# Patient Record
Sex: Male | Born: 1940 | Race: White | Hispanic: No | Marital: Married | Smoking: Former smoker
Health system: Southern US, Community
[De-identification: ages and names within clinical notes are randomized; demographics above are authoritative.]

## PROBLEM LIST (undated history)

## (undated) DIAGNOSIS — I350 Nonrheumatic aortic (valve) stenosis: Secondary | ICD-10-CM

## (undated) DIAGNOSIS — I471 Supraventricular tachycardia, unspecified: Secondary | ICD-10-CM

## (undated) DIAGNOSIS — R32 Unspecified urinary incontinence: Secondary | ICD-10-CM

## (undated) DIAGNOSIS — I4891 Unspecified atrial fibrillation: Secondary | ICD-10-CM

## (undated) DIAGNOSIS — I4819 Other persistent atrial fibrillation: Secondary | ICD-10-CM

## (undated) DIAGNOSIS — I2510 Atherosclerotic heart disease of native coronary artery without angina pectoris: Secondary | ICD-10-CM

## (undated) DIAGNOSIS — I10 Essential (primary) hypertension: Secondary | ICD-10-CM

## (undated) DIAGNOSIS — K4090 Unilateral inguinal hernia, without obstruction or gangrene, not specified as recurrent: Secondary | ICD-10-CM

## (undated) DIAGNOSIS — N183 Chronic kidney disease, stage 3 (moderate): Secondary | ICD-10-CM

## (undated) DIAGNOSIS — E119 Type 2 diabetes mellitus without complications: Secondary | ICD-10-CM

## (undated) HISTORY — DX: Type 2 diabetes mellitus without complications: E11.9

## (undated) HISTORY — DX: Atherosclerotic heart disease of native coronary artery without angina pectoris: I25.10

## (undated) HISTORY — PX: CARDIAC CATHETERIZATION: SHX172

## (undated) HISTORY — PX: AORTIC VALVE REPLACEMENT: SHX41

## (undated) HISTORY — DX: Unspecified atrial fibrillation: I48.91

## (undated) HISTORY — DX: Nonrheumatic aortic (valve) stenosis: I35.0

## (undated) HISTORY — DX: Unilateral inguinal hernia, without obstruction or gangrene, not specified as recurrent: K40.90

## (undated) HISTORY — DX: Chronic kidney disease, stage 3 (moderate): N18.3

## (undated) HISTORY — DX: Unspecified urinary incontinence: R32

## (undated) HISTORY — DX: Supraventricular tachycardia, unspecified: I47.10

## (undated) HISTORY — DX: Supraventricular tachycardia: I47.1

---
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
## 2005-02-19 ENCOUNTER — Ambulatory Visit: Payer: Self-pay | Admitting: Urology

## 2005-10-27 ENCOUNTER — Ambulatory Visit: Payer: Self-pay

## 2007-05-26 ENCOUNTER — Ambulatory Visit: Payer: Self-pay

## 2008-01-30 HISTORY — PX: COLONOSCOPY: SHX174

## 2010-08-22 ENCOUNTER — Ambulatory Visit: Payer: Self-pay | Admitting: Gastroenterology

## 2010-08-23 LAB — PATHOLOGY REPORT

## 2010-12-27 ENCOUNTER — Ambulatory Visit: Payer: Self-pay | Admitting: Ophthalmology

## 2013-01-29 HISTORY — PX: OTHER SURGICAL HISTORY: SHX169

## 2013-09-21 DIAGNOSIS — E119 Type 2 diabetes mellitus without complications: Secondary | ICD-10-CM | POA: Insufficient documentation

## 2013-12-01 DIAGNOSIS — I119 Hypertensive heart disease without heart failure: Secondary | ICD-10-CM | POA: Insufficient documentation

## 2013-12-01 DIAGNOSIS — N401 Enlarged prostate with lower urinary tract symptoms: Secondary | ICD-10-CM | POA: Insufficient documentation

## 2013-12-01 DIAGNOSIS — E782 Mixed hyperlipidemia: Secondary | ICD-10-CM | POA: Insufficient documentation

## 2013-12-01 DIAGNOSIS — N3943 Post-void dribbling: Secondary | ICD-10-CM | POA: Insufficient documentation

## 2013-12-01 HISTORY — DX: Hypertensive heart disease without heart failure: I11.9

## 2013-12-16 ENCOUNTER — Ambulatory Visit: Payer: Self-pay | Admitting: Ophthalmology

## 2013-12-31 DIAGNOSIS — I350 Nonrheumatic aortic (valve) stenosis: Secondary | ICD-10-CM

## 2013-12-31 HISTORY — DX: Nonrheumatic aortic (valve) stenosis: I35.0

## 2014-01-07 ENCOUNTER — Ambulatory Visit: Payer: Self-pay | Admitting: Internal Medicine

## 2014-01-11 DIAGNOSIS — I2510 Atherosclerotic heart disease of native coronary artery without angina pectoris: Secondary | ICD-10-CM | POA: Insufficient documentation

## 2014-03-02 DIAGNOSIS — D62 Acute posthemorrhagic anemia: Secondary | ICD-10-CM | POA: Insufficient documentation

## 2014-03-04 DIAGNOSIS — I4510 Unspecified right bundle-branch block: Secondary | ICD-10-CM | POA: Insufficient documentation

## 2014-03-04 DIAGNOSIS — I440 Atrioventricular block, first degree: Secondary | ICD-10-CM | POA: Insufficient documentation

## 2014-03-04 HISTORY — DX: Atrioventricular block, first degree: I44.0

## 2014-03-04 HISTORY — DX: Unspecified right bundle-branch block: I45.10

## 2014-03-31 DIAGNOSIS — R002 Palpitations: Secondary | ICD-10-CM | POA: Insufficient documentation

## 2014-04-06 ENCOUNTER — Encounter: Payer: Self-pay | Admitting: Cardiothoracic Surgery

## 2014-05-18 ENCOUNTER — Emergency Department
Admit: 2014-05-18 | Disposition: A | Discharge status: Home / Self Care | Payer: Self-pay | Admitting: Emergency Medicine

## 2014-05-18 LAB — CBC WITH DIFFERENTIAL/PLATELET
BASOS PCT: 0.9 %
Basophil #: 0.1 10*3/uL (ref 0.0–0.1)
Eosinophil #: 0.1 10*3/uL (ref 0.0–0.7)
Eosinophil %: 1.2 %
HCT: 35 % — AB (ref 40.0–52.0)
HGB: 11.4 g/dL — AB (ref 13.0–18.0)
LYMPHS ABS: 1.2 10*3/uL (ref 1.0–3.6)
Lymphocyte %: 17 %
MCH: 26.6 pg (ref 26.0–34.0)
MCHC: 32.4 g/dL (ref 32.0–36.0)
MCV: 82 fL (ref 80–100)
MONOS PCT: 7.4 %
Monocyte #: 0.5 x10 3/mm (ref 0.2–1.0)
Neutrophil #: 5.3 10*3/uL (ref 1.4–6.5)
Neutrophil %: 73.5 %
PLATELETS: 251 10*3/uL (ref 150–440)
RBC: 4.26 10*6/uL — AB (ref 4.40–5.90)
RDW: 15.4 % — ABNORMAL HIGH (ref 11.5–14.5)
WBC: 7.2 10*3/uL (ref 3.8–10.6)

## 2014-05-18 LAB — COMPREHENSIVE METABOLIC PANEL
ALBUMIN: 3.8 g/dL
AST: 30 U/L
Alkaline Phosphatase: 75 U/L
Anion Gap: 7 (ref 7–16)
BUN: 34 mg/dL — ABNORMAL HIGH
Bilirubin,Total: 0.5 mg/dL
Calcium, Total: 8.7 mg/dL — ABNORMAL LOW
Chloride: 107 mmol/L
Co2: 25 mmol/L
Creatinine: 1.71 mg/dL — ABNORMAL HIGH
GFR CALC AF AMER: 44 — AB
GFR CALC NON AF AMER: 38 — AB
GLUCOSE: 277 mg/dL — AB
Potassium: 3.4 mmol/L — ABNORMAL LOW
SGPT (ALT): 17 U/L
Sodium: 139 mmol/L
TOTAL PROTEIN: 6.6 g/dL

## 2014-05-18 LAB — TROPONIN I: Troponin-I: 0.04 ng/mL — ABNORMAL HIGH

## 2014-05-18 LAB — TSH: Thyroid Stimulating Horm: 2.495 u[IU]/mL

## 2014-05-28 LAB — COMPREHENSIVE METABOLIC PANEL
ANION GAP: 7 (ref 7–16)
Albumin: 4 g/dL
Alkaline Phosphatase: 72 U/L
BUN: 25 mg/dL — AB
Bilirubin,Total: 0.5 mg/dL
CO2: 26 mmol/L
Calcium, Total: 9 mg/dL
Chloride: 107 mmol/L
Creatinine: 1.32 mg/dL — ABNORMAL HIGH
EGFR (African American): >60
EGFR (Non-African Amer.): 52 — ABNORMAL LOW
Glucose: 160 mg/dL — ABNORMAL HIGH
Potassium: 3.8 mmol/L
SGOT(AST): 26 U/L
SGPT (ALT): 16 U/L — ABNORMAL LOW
SODIUM: 140 mmol/L
TOTAL PROTEIN: 6.8 g/dL

## 2014-05-28 LAB — CBC
HCT: 36.5 % — AB (ref 40.0–52.0)
HGB: 12 g/dL — AB (ref 13.0–18.0)
MCH: 27 pg (ref 26.0–34.0)
MCHC: 32.8 g/dL (ref 32.0–36.0)
MCV: 82 fL (ref 80–100)
Platelet: 205 10*3/uL (ref 150–440)
RBC: 4.44 10*6/uL (ref 4.40–5.90)
RDW: 16.2 % — AB (ref 11.5–14.5)
WBC: 9.5 10*3/uL (ref 3.8–10.6)

## 2014-05-28 LAB — TROPONIN I
TROPONIN-I: 0.12 ng/mL — AB
Troponin-I: 0.17 ng/mL — ABNORMAL HIGH

## 2014-05-29 ENCOUNTER — Inpatient Hospital Stay
Admit: 2014-05-29 | Discharge: 2014-05-29 | Disposition: A | Discharge status: Home / Self Care | Payer: Medicare Other | Attending: Specialist | Admitting: Specialist

## 2014-05-29 DIAGNOSIS — I471 Supraventricular tachycardia, unspecified: Secondary | ICD-10-CM | POA: Diagnosis present

## 2014-05-29 LAB — TROPONIN I: Troponin-I: 0.15 ng/mL — ABNORMAL HIGH

## 2014-05-29 MED ORDER — SODIUM CHLORIDE 0.9 % IJ SOLN: 3.0000 mL | INTRAMUSCULAR | Status: DC | PRN

## 2014-05-29 MED ORDER — ACETAMINOPHEN 325 MG PO TABS: 650.0000 mg | ORAL_TABLET | ORAL | Status: DC | PRN

## 2014-05-29 MED ORDER — ASPIRIN EC 81 MG PO TBEC: 81.0000 mg | DELAYED_RELEASE_TABLET | Freq: Every day | ORAL | Status: DC

## 2014-05-29 MED ORDER — ONDANSETRON HCL 4 MG/2ML IJ SOLN: 4.0000 mg | INTRAMUSCULAR | Status: DC | PRN

## 2014-05-29 MED ORDER — METOPROLOL SUCCINATE ER 100 MG PO TB24: 100.0000 mg | ORAL_TABLET | Freq: Every day | ORAL | Status: DC

## 2014-05-29 MED ORDER — DILTIAZEM HCL ER COATED BEADS 240 MG PO CP24: 240.0000 mg | ORAL_CAPSULE | Freq: Every day | ORAL | Status: DC

## 2014-05-29 MED ORDER — LISINOPRIL 20 MG PO TABS: 20.0000 mg | ORAL_TABLET | Freq: Two times a day (BID) | ORAL | Status: DC

## 2014-05-31 DIAGNOSIS — Z8679 Personal history of other diseases of the circulatory system: Secondary | ICD-10-CM | POA: Insufficient documentation

## 2014-05-31 NOTE — Consult Note (Signed)
PATIENT NAME:  Timothy Riley, CLOWSER MR#:  [id number] DATE OF BIRTH:  1940-12-15  DATE OF CONSULTATION:  05/29/2014  REFERRING PHYSICIAN:   CONSULTING PHYSICIAN:  Isaias Cowman, MD  PRIMARY CARE PHYSICIAN: Youlanda Roys. Lovie Macadamia, MD  CARDIOLOGIST: Corey Skains, MD   CHIEF COMPLAINT: Palpitations.    REASON FOR CONSULTATION: Consultation requested for evaluation of paroxysmal SVT.   HISTORY OF PRESENT ILLNESS: The patient is a 74 year old gentleman status post recent aortic valve replacement, admitted after an episode of paroxysmal SVT. The patient underwent recent aortic valve replacement with bioprosthetic valve by Dr. Evelina Dun at [facility] in 03/2014. The patient has been in his usual state of health until the past 2 weeks when he has had 4 separate episodes of palpitations and apparent SVT. Approximately 10 days ago, the patient was watching TV, developed racing heart rate, went to the Emergency Room, was diagnosed with PSVT, treated with intravenous metoprolol and converted to sinus rhythm. The patient has had 2 additional episodes during the interim and with each episode, Dr. Nehemiah Massed has increased his metoprolol succinate to 100 and 200 mg daily. The patient reports that he is currently taking 100 mg daily and did not increase it to 200 mg daily as prescribed. 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 He started to develop palpitations, was talking to a customer and was unable to speak for approximately 30 seconds. EMS was called, the patient was tachycardic with rates in the 160s, was given 2 doses of adenosine without conversion. The patient was then given a Cardizem bolus and converted to sinus rhythm. He presented to [facility] Emergency Room where he was in sinus rhythm with frequent premature atrial contractions. He has remained in sinus rhythm overnight. The patient has borderline elevated troponin of 0.17 in the absence of chest pain. The patient has  undergone recent aortic valve replacement without significant coronary artery disease.    PAST MEDICAL HISTORY: 1.  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  2.  Paroxysmal SVT.  3.  Hypertension.  4.  Diabetes medication.   MEDICATIONS ON ADMISSION: Aspirin 81 mg daily, amlodipine 5 mg daily, metoprolol succinate 100 mg daily, lisinopril 20 mg daily, glipizide 5 mg b.i.d.   SOCIAL HISTORY: The patient is married, resides with his wife. He denies tobacco abuse.   FAMILY HISTORY: No immediate family history of coronary artery disease or myocardial infarction.   REVIEW OF SYSTEMS:  CONSTITUTIONAL: No fever or chills.  EYES: No blurry vision.  EARS: No hearing loss.  RESPIRATORY: The patient denies shortness of breath.  CARDIOVASCULAR: Palpitations as described above.  GASTROINTESTINAL: No nausea, vomiting, or diarrhea.  GENITOURINARY: No dysuria or hematuria.  ENDOCRINE: No polyuria or polydipsia.  MUSCULOSKELETAL: No arthralgias or myalgias.  NEUROLOGICAL: No focal muscle weakness or numbness.  PSYCHOLOGICAL: No depression or anxiety.   PHYSICAL EXAMINATION: VITAL SIGNS: Blood pressure of 190/73, pulse 55, respirations 20, temperature 97.8, pulse oximetry 96%.  HEENT: Pupils equal and reactive to light and accommodation.  NECK: Supple without thyromegaly.  LUNGS: Clear.  CARDIOVASCULAR: Normal JVP. Normal PMI. Regular rate and rhythm. Normal S1, S2. No appreciable gallop, murmur, or rub.  ABDOMEN: Soft and nontender. Pulses were intact bilaterally.  MUSCULOSKELETAL: Normal muscle tone.  NEUROLOGIC: The patient is alert and oriented x 3. Motor and sensory both grossly intact.   IMPRESSION: A 74 year old gentleman with recent aortic valve replacement surgery 03/2014, 4 separate episodes of tachycardia, probable  paroxysmal supraventricular tachycardia, initial episode responded to metoprolol, most recent episode responded to Cardizem. The patient currently in sinus  rhythm with frequent premature atrial contractions. Blood pressure is elevated.   RECOMMENDATIONS: 1.  Continue metoprolol succinate 100 mg daily.  2.  Switch Cardizem 60 mg q. 8 to Cardizem CD 240 mg daily.  3.  Continue aspirin for now.    4.  Repeat 2-D echocardiogram to rule out intracardiac thrombus in light of brief episode of aphasia.  5.  Further recommendations pending echocardiogram results.  6.  Will likely refer to [facility] for outpatient evaluation of catheter ablation of SVT.    ____________________________ Isaias Cowman, MD ap:at D: 05/29/2014 08:27:47 ET T: 05/29/2014 12:48:51 ET JOB#: [id number]  cc: Isaias Cowman, MD, <Dictator> Isaias Cowman MD ELECTRONICALLY SIGNED 05/29/2014 14:17

## 2014-05-31 NOTE — H&P (Signed)
PATIENT NAME:  Timothy Riley, Timothy Riley MR#:  [id number] DATE OF BIRTH:  [birth year]  DATE OF ADMISSION:  05/28/2014  PRIMARY CARE PHYSICIAN:  Dr. Lovie Macadamia.   CARDIOLOGIST: Dr. Serafina Royals.   CHIEF COMPLAINT: Palpitations.   HISTORY OF PRESENT ILLNESS: This is a 74 year old male who presented to the Emergency Room due to palpitations and noted to be in SVT.  The patient says that he had 4 attacks of palpitations now in the past 2 weeks. He had a similar one about 10 days ago where he was sitting watching TV when he started to feel his heart was racing. He also started to develop some chest discomfort with it. He therefore came to the ER. He was given some metoprolol and he converted to a sinus rhythm and was told to follow up with his cardiologist, Dr. Nehemiah Massed. He had another attack shortly after he was seen in the ER and discharged home. He went to see Dr. Nehemiah Massed who then increased his metoprolol dose and doubled it. He then had another additional episode a few days back and was advised to also increase his metoprolol again. Today while at work he had an episode where his palpitations were not improving and then he had an episode where he lost his speech for about 15-30 seconds. EMS was called, when EMS arrived his heart rate was in the 160s. The patient was given 6 of adenosine and converted to a sinus rhythm, but shortly thereafter went back into SVT. He was then given 12 mg of adenosine and then converted to sinus rhythm and went back into SVT again. He was then given another 12 mg of additional adenosine and 10 mg of Cardizem and then converted to a sinus rhythm and has been maintained in it. The patient presented to the ER and was noted to be in sinus rhythm with frequent PACs.  His troponin was mildly elevated at 0.12. Hospitalist services were contacted for further treatment and evaluation.   REVIEW OF SYSTEMS:    CONSTITUTIONAL: No documented fever. No weight gain, no weight loss.  EYES: No blurry  or double vision.  EARS, NOSE, AND THROAT: No tinnitus. No postnasal drip. No redness of the oropharynx.  RESPIRATORY: No cough, no wheeze, no hemoptysis, no dyspnea.  CARDIOVASCULAR: No chest pain, no orthopnea. Positive palpitations.  No syncope.  GASTROINTESTINAL: No nausea, no vomiting, no diarrhea. No abdominal pain. No melena or hematochezia.  GENITOURINARY: No dysuria or hematuria.  ENDOCRINE: No polyuria, nocturia, heat or cold intolerance.  HEMATOLOGIC: No anemia. No bruising. No bleeding.  INTEGUMENTARY: No rashes. No lesions.  MUSCULOSKELETAL: No arthritis. No swelling. No gout.  NEUROLOGIC: No numbness or tingling. No ataxia. No seizure activity.  PSYCHIATRIC: No anxiety, no insomnia, no ADD.    PAST MEDICAL HISTORY: Consistent with diabetes, hypertension, history of recent aortic valve replacement for aortic stenosis.   ALLERGIES: HYDRALAZINE WHICH CAUSES A RASH.   SOCIAL HISTORY: No smoking. No alcohol abuse. No illicit drug abuse. Lives at home with his family.   FAMILY HISTORY: Mother and father are both deceased. The patient's mother and father both had hypertension.   CURRENT MEDICATIONS:  Amlodipine 5 mg daily, aspirin 81 mg daily, glipizide 5 mg b.i.d., lisinopril 20 mg daily, metoprolol succinate 100 mg daily.   PHYSICAL EXAMINATION:  VITAL SIGNS: Temperature is 98.4, pulse 69, respirations 20, blood pressure 112/65, saturation is 95% on room air.  GENERAL: He is a pleasant-appearing male in no apparent distress.  HEAD, EYES, EARS, NOSE,  THROAT: He is atraumatic, normocephalic. Extraocular muscles are intact. Pupils reactive to light. Sclerae anicteric. No conjunctival injection. No pharyngeal erythema.  NECK: Supple. There is no jugular venous distention. No bruits, no lymphadenopathy, no thyromegaly.  HEART: Regular rate and rhythm. No murmurs, no rubs, no clicks.  LUNGS: Clear to auscultation bilaterally. No rales or rhonchi. No wheezes.  ABDOMEN: Soft, flat,  nontender, nondistended. Has good bowel sounds. No hepatosplenomegaly appreciated.  EXTREMITIES: No evidence of any cyanosis, clubbing, or peripheral edema. Has + 2 pedal and radial pulses bilaterally.  NEUROLOGICAL: The patient is alert, awake, and oriented x 3 with no focal motor or sensory deficits appreciated bilaterally.  SKIN: Moist and warm with no rashes appreciated.  LYMPHATIC: There is no cervical or axillary lymphadenopathy.   LABORATORY DATA: Serum glucose of 160, BUN 25, creatinine 1.3, sodium 140, potassium 3.8, chloride 107, bicarbonate 26. LFTs are within normal limits. Troponin 0.12. White cell count 9.5, hemoglobin 12.0, hematocrit 36.5, platelet count 205,000. The patient had a chest x-ray done which showed no evidence of any acute cardiopulmonary disease, status post aortic valve replacement.   ASSESSMENT AND PLAN: This is a 74 year old male with a history of diabetes, hypertension, history of recent aortic valve replacement for aortic stenosis, who presented to the hospital due to palpitations and noted to be in supraventricular tachycardia.   1.  Supraventricular tachycardia. This was likely the cause of the patient's palpitations and chest pain. The exact underlying rhythm is unclear as he is converted to a sinus rhythm, whether it is atrial fibrillation or atrial flutter or sinus arrhythmia. The patient has apparently had 4 attacks of palpitations in the past 2-3 weeks and has not improved despite increasing doses of metoprolol. For now observe him on telemetry. Follow serial cardiac markers. We will get a cardiology consult. We will place him on oral Cardizem. He likely needs an electrophysiologic study to be done as an outpatient, but will defer this to cardiology for now.  2.  Diabetes. Continue his glipizide and carbohydrate-controlled diet.  3.  Hypertension. Continue Norvasc and lisinopril.   4.  Elevated troponin. This is  likely in the setting of demand ischemia from his  supraventricular tachycardia. There is no acute evidence of coronary syndrome. Observe him on telemetry, follow serial cardiac markers, continue aspirin for now.   CODE STATUS: The patient is a full code.   TIME SPENT ON ADMISSION: 50 minutes.     ____________________________ Belia Heman. Verdell Carmine, MD vjs:bu D: 05/28/2014 19:55:48 ET T: 05/28/2014 20:27:52 ET JOB#: [id number]  cc: Belia Heman. Verdell Carmine, MD, <Dictator> Henreitta Leber MD ELECTRONICALLY SIGNED 05/28/2014 22:21

## 2014-05-31 NOTE — Discharge Summary (Signed)
PATIENT NAME:  Timothy Riley, Timothy Riley MR#:  [id number] DATE OF BIRTH:  [birth year]  DATE OF ADMISSION:  05/28/2014 DATE OF DISCHARGE:  05/29/2014  PRIMARY CARE PHYSICIAN: Woodfin Ganja, MD   CARDIOLOGIST: Corey Skains, MD   FINAL DIAGNOSES: 1.  Supraventricular tachycardia with borderline troponin.  2.  Brief aphasia.  3.  Hypertension, essential.  4.  Diabetes, controlled.   MEDICATIONS ON DISCHARGE: Include glipizide 5 mg twice a day, lisinopril 20 mg at bedtime, metoprolol ER 100 mg in the morning, aspirin 81 mg at bedtime, diltiazem 240 mg extended-release at bedtime; stop taking amlodipine.   DIET: Low sodium diet, regular consistency.   ACTIVITY: As tolerated.   FOLLOWUP: In 1 to 2 weeks with Dr. Lovie Macadamia; follow-up family said patient had an appointment on Monday with Dr. Nehemiah Massed.   HOSPITAL COURSE: The patient was admitted as an observation April 29, discharged on April 30; came in with palpitations. The patient also had a transient episode where he last lost his speech for a period of time, but did not have any weakness at that time. When EMS arrived, he had a heart rate in the 160s, given 6 mg adenosine, converted to sinus rhythm, but shortly after went into supraventricular tachycardia, given 12 mg of adenosine, converted to sinus rhythm, and went into supraventricular tachycardia again, given another 12 mg of adenosine, and 10 mg of Cardizem, and converted to sinus rhythm. The patient was admitted as an observation.   LABORATORY AND RADIOLOGICAL DATA: During the hospital course, included:  1.  A troponin of 0.12, white blood cell count 9.5, hemoglobin and hematocrit 12.0 and 36.5; platelet count of 205,000, glucose 160, BUN 25, creatinine 1.32, sodium 140, potassium 3.8, chloride 107, CO2 of 26, calcium 9.0; liver function tests normal range, chest x-ray stable cardiomegaly, status post aortic valve replacement, no edema or consolidation.  2.  Troponin borderline at 0.17, next  troponin borderline at 0.15; echocardiogram showed an ejection fraction of 50% to 55%, dilated left and right atrium, bovine prosthesis in the aortic position.   HOSPITAL COURSE PER PROBLEM LIST:  1.  For the patient's SVT and borderline troponin. Heart rate controlled, seen in consultation by Dr. Saralyn Pilar, who was covering for Dr. Nehemiah Massed, and kept on his metoprolol and Cardizem CD was prescribed upon going home. Short-acting Cardizem was used during the hospital course. The patient maintain sinus rhythm, actually had an episode of bradycardia with heart rate down into the 30s, but asymptomatic with that. The patient was more symptomatic with the tachycardia. Can follow up with EP specialist for possible ablation, but follow-up with Dr. Nehemiah Massed first.  2.  Brief episode of aphasia. ER physician and admitting physician did not order any imaging. Since the patient is asymptomatic at this point, I believe this is likely secondary to the fast heart rate, no further imaging needed at this point.  3.  Hypertension, essential. Blood pressure did go up, but better controlled upon discharge.  4.  Diabetes type 2 controlled.   Time spent on discharge 35 minutes.    ____________________________ Tana Conch. Leslye Peer, MD rjw:[id number] D: 05/29/2014 15:24:39 ET T: 05/30/2014 09:03:02 ET JOB#: [id number]  cc: Tana Conch. Leslye Peer, MD, <Dictator> Youlanda Roys. Lovie Macadamia, MD Corey Skains, MD  Marisue Brooklyn MD ELECTRONICALLY SIGNED 05/31/2014 7:23

## 2014-06-17 DIAGNOSIS — N183 Chronic kidney disease, stage 3 unspecified: Secondary | ICD-10-CM

## 2014-06-17 DIAGNOSIS — I4891 Unspecified atrial fibrillation: Secondary | ICD-10-CM | POA: Insufficient documentation

## 2014-06-17 DIAGNOSIS — N1832 Chronic kidney disease, stage 3b: Secondary | ICD-10-CM | POA: Insufficient documentation

## 2014-06-17 HISTORY — DX: Chronic kidney disease, stage 3 unspecified: N18.30

## 2014-06-29 ENCOUNTER — Other Ambulatory Visit: Payer: Self-pay

## 2014-06-29 ENCOUNTER — Emergency Department
Admission: EM | Admit: 2014-06-29 | Discharge: 2014-06-29 | Disposition: A | Discharge status: Home / Self Care | Payer: Medicare Other | Attending: Emergency Medicine | Admitting: Emergency Medicine

## 2014-06-29 ENCOUNTER — Encounter: Payer: Self-pay | Admitting: Emergency Medicine

## 2014-06-29 DIAGNOSIS — I10 Essential (primary) hypertension: Secondary | ICD-10-CM | POA: Diagnosis present

## 2014-06-29 DIAGNOSIS — Z7982 Long term (current) use of aspirin: Secondary | ICD-10-CM | POA: Diagnosis not present

## 2014-06-29 DIAGNOSIS — Z79899 Other long term (current) drug therapy: Secondary | ICD-10-CM | POA: Diagnosis not present

## 2014-06-29 HISTORY — DX: Essential (primary) hypertension: I10

## 2014-06-29 LAB — BASIC METABOLIC PANEL
ANION GAP: 10 (ref 5–15)
BUN: 21 mg/dL — ABNORMAL HIGH (ref 6–20)
CO2: 29 mmol/L (ref 22–32)
Calcium: 9.1 mg/dL (ref 8.9–10.3)
Chloride: 103 mmol/L (ref 101–111)
Creatinine, Ser: 1.3 mg/dL — ABNORMAL HIGH (ref 0.61–1.24)
GFR calc Af Amer: >60 mL/min (ref >60)
GFR calc non Af Amer: 52 mL/min — ABNORMAL LOW (ref >60)
Glucose, Bld: 206 mg/dL — ABNORMAL HIGH (ref 65–99)
Potassium: 3.6 mmol/L (ref 3.5–5.1)
SODIUM: 142 mmol/L (ref 135–145)

## 2014-06-29 LAB — CBC
HEMATOCRIT: 36.7 % — AB (ref 40.0–52.0)
HEMOGLOBIN: 12.4 g/dL — AB (ref 13.0–18.0)
MCH: 27.4 pg (ref 26.0–34.0)
MCHC: 33.6 g/dL (ref 32.0–36.0)
MCV: 81.3 fL (ref 80.0–100.0)
Platelets: 179 10*3/uL (ref 150–440)
RBC: 4.52 MIL/uL (ref 4.40–5.90)
RDW: 16.7 % — ABNORMAL HIGH (ref 11.5–14.5)
WBC: 6.2 10*3/uL (ref 3.8–10.6)

## 2014-06-29 LAB — TROPONIN I

## 2014-06-29 NOTE — ED Notes (Signed)
Pt to ed with c/o HTN,  Pt states he is scheduled for ablation tomorrow and was in to see cardiac md today,  Was told to come here after finding blood pressure elevated.  Pt states he was told to stop all bp meds for 5 days prior to having ablation.  Denies chest pain.

## 2014-06-29 NOTE — ED Provider Notes (Signed)
[facility] Emergency Department Provider Note   ____________________________________________  Time seen: 1950  I have reviewed the triage vital signs and the nursing notes.   HISTORY  Chief Complaint Hypertension   History limited by: Not Limited   HPI Timothy Riley is a 74 y.o. male who presents to the emergency department today at the request of his anesthesiologist for high blood pressure. The patient is scheduled to have an ablation tomorrow. Today he had a preop appointment he was noted to have high blood pressure. Patient does state that he was told to stay off of his blood pressure medications 5 days prior to the surgery. The patient himself denies any headache, chest pain or any other symptoms. States he has a long history of hard to control blood pressure.   Past Medical History  Diagnosis Date  . Hypertension     Patient Active Problem List   Diagnosis Date Noted  . 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 Heart valve replacment      Current Outpatient Rx  Name  Route  Sig  Dispense  Refill  . amLODipine (NORVASC) 5 MG tablet   Oral   Take 5 mg by mouth daily. In the morning         . aspirin 81 MG tablet   Oral   Take 81 mg by mouth daily. At bed time.         Marland Kitchen glipiZIDE (GLUCOTROL) 5 MG tablet   Oral   Take 5 mg by mouth 2 (two) times daily.         Marland Kitchen lisinopril (PRINIVIL,ZESTRIL) 20 MG tablet   Oral   Take 20 mg by mouth daily. Orally once a day at (bedtime)         . metoprolol succinate (TOPROL-XL) 100 MG 24 hr tablet   Oral   Take 100 mg by mouth daily. Take with or immediately following a meal. Once a day in the morning.           Allergies Hydralazine hcl  History reviewed. No pertinent family history.  Social History History  Substance Use Topics  . Smoking status: Never Smoker   . Smokeless tobacco: Not on file  . Alcohol Use: No    Review of  Systems  Constitutional: Negative for fever. Cardiovascular: Negative for chest pain. Respiratory: Negative for shortness of breath. Gastrointestinal: Negative for abdominal pain, vomiting and diarrhea. Genitourinary: Negative for dysuria. Musculoskeletal: Negative for back pain. Skin: Negative for rash. Neurological: Negative for headaches, focal weakness or numbness.   10-point ROS otherwise negative.  ____________________________________________   PHYSICAL EXAM:  VITAL SIGNS: ED Triage Vitals  Enc Vitals Group     BP 06/29/14 1726 204/82 mmHg     Pulse Rate 06/29/14 1726 98     Resp 06/29/14 1726 16     Temp 06/29/14 1726 98.6 F (37 C)     Temp Source 06/29/14 1726 Oral     SpO2 06/29/14 1726 97 %     Weight 06/29/14 1726 169 lb (76.658 kg)     Height 06/29/14 1726 5\' 7"  (1.702 m)     Head Cir --      Peak Flow --      Pain Score 06/29/14 1747 0     Pain Loc --      Pain Edu? --      Excl. in [place]? --      Constitutional:  Alert and oriented. Well appearing and in no distress. Eyes: Conjunctivae are normal. PERRL. Normal extraocular movements. ENT   Head: Normocephalic and atraumatic.   Nose: No congestion/rhinnorhea.   Mouth/Throat: Mucous membranes are moist.   Neck: No stridor. Hematological/Lymphatic/Immunilogical: No cervical lymphadenopathy. Cardiovascular: Normal rate, regular rhythm.  No murmurs, rubs, or gallops. Respiratory: Normal respiratory effort without tachypnea nor retractions. Breath sounds are clear and equal bilaterally. No wheezes/rales/rhonchi. Gastrointestinal: Soft and nontender. No distention.  Genitourinary: Deferred Musculoskeletal: Normal range of motion in all extremities. No joint effusions.  No lower extremity tenderness nor edema. Neurologic:  Normal speech and language. No gross focal neurologic deficits are appreciated. Speech is normal.  Skin:  Skin is warm, dry and intact. No rash noted. Psychiatric: Mood and  affect are normal. Speech and behavior are normal. Patient exhibits appropriate insight and judgment.  ____________________________________________    LABS (pertinent positives/negatives)  Labs Reviewed  CBC - Abnormal; Notable for the following:    Hemoglobin 12.4 (*)    HCT 36.7 (*)    RDW 16.7 (*)    All other components within normal limits  BASIC METABOLIC PANEL - Abnormal; Notable for the following:    Glucose, Bld 206 (*)    BUN 21 (*)    Creatinine, Ser 1.30 (*)    GFR calc non Af Amer 52 (*)    All other components within normal limits  TROPONIN I     ____________________________________________   EKG  I, Nance Pear, attending physician, personally viewed and interpreted this EKG  EKG Time: 1747 Rate: 69 Rhythm: Normal Sinus rhythm Axis: Normal Intervals: QTC 490 QRS: Right bundle-branch block ST changes: No ST elevation    ____________________________________________    RADIOLOGY  None  ____________________________________________   PROCEDURES  Procedure(s) performed: None  Critical Care performed: No  ____________________________________________   INITIAL IMPRESSION / ASSESSMENT AND PLAN / ED COURSE  Pertinent labs & imaging results that were available during my care of the patient were reviewed by me and considered in my medical decision making (see chart for details).  Patient sent here for high blood pressure. Patient is asymptomatic. Patient has a long history of high blood pressure. Blood work here without any concerning findings. Will discharge home.  ____________________________________________   FINAL CLINICAL IMPRESSION(S) / ED DIAGNOSES  Final diagnoses:  Essential hypertension     Nance Pear, MD 06/29/14 2020

## 2014-06-29 NOTE — Discharge Instructions (Signed)
Please seek medical attention for any high fevers, chest pain, shortness of breath, change in behavior, persistent vomiting, bloody stool or any other new or concerning symptoms.  Hypertension Hypertension, commonly called high blood pressure, is when the force of blood pumping through your arteries is too strong. Your arteries are the blood vessels that carry blood from your heart throughout your body. A blood pressure reading consists of a higher number over a lower number, such as 110/72. The higher number (systolic) is the pressure inside your arteries when your heart pumps. The lower number (diastolic) is the pressure inside your arteries when your heart relaxes. Ideally you want your blood pressure below 120/80. Hypertension forces your heart to work harder to pump blood. Your arteries may become narrow or stiff. Having hypertension puts you at risk for heart disease, stroke, and other problems.  RISK FACTORS Some risk factors for high blood pressure are controllable. Others are not.  Risk factors you cannot control include:   Race. You may be at higher risk if you are African American.  Age. Risk increases with age.  Gender. Men are at higher risk than women before age 80 years. After age 44, women are at higher risk than men. Risk factors you can control include:  Not getting enough exercise or physical activity.  Being overweight.  Getting too much fat, sugar, calories, or salt in your diet.  Drinking too much alcohol. SIGNS AND SYMPTOMS Hypertension does not usually cause signs or symptoms. Extremely high blood pressure (hypertensive crisis) may cause headache, anxiety, shortness of breath, and nosebleed. DIAGNOSIS  To check if you have hypertension, your health care provider will measure your blood pressure while you are seated, with your arm held at the level of your heart. It should be measured at least twice using the same arm. Certain conditions can cause a difference in  blood pressure between your right and left arms. A blood pressure reading that is higher than normal on one occasion does not mean that you need treatment. If one blood pressure reading is high, ask your health care provider about having it checked again. TREATMENT  Treating high blood pressure includes making lifestyle changes and possibly taking medicine. Living a healthy lifestyle can help lower high blood pressure. You may need to change some of your habits. Lifestyle changes may include:  Following the DASH diet. This diet is high in fruits, vegetables, and whole grains. It is low in salt, red meat, and added sugars.  Getting at least 2 hours of brisk physical activity every week.  Losing weight if necessary.  Not smoking.  Limiting alcoholic beverages.  Learning ways to reduce stress. If lifestyle changes are not enough to get your blood pressure under control, your health care provider may prescribe medicine. You may need to take more than one. Work closely with your health care provider to understand the risks and benefits. HOME CARE INSTRUCTIONS  Have your blood pressure rechecked as directed by your health care provider.   Take medicines only as directed by your health care provider. Follow the directions carefully. Blood pressure medicines must be taken as prescribed. The medicine does not work as well when you skip doses. Skipping doses also puts you at risk for problems.   Do not smoke.   Monitor your blood pressure at home as directed by your health care provider. SEEK MEDICAL CARE IF:   You think you are having a reaction to medicines taken.  You have recurrent headaches or feel  dizzy.  You have swelling in your ankles.  You have trouble with your vision. SEEK IMMEDIATE MEDICAL CARE IF:  You develop a severe headache or confusion.  You have unusual weakness, numbness, or feel faint.  You have severe chest or abdominal pain.  You vomit repeatedly.  You  have trouble breathing. MAKE SURE YOU:   Understand these instructions.  Will watch your condition.  Will get help right away if you are not doing well or get worse. Document Released: 01/15/2005 Document Revised: 06/01/2013 Document Reviewed: 11/07/2012 [facility] Patient Information 2015 [place], [place]. This information is not intended to replace advice given to you by your health care provider. Make sure you discuss any questions you have with your health care provider.

## 2014-07-29 ENCOUNTER — Encounter: Payer: Self-pay | Admitting: *Deleted

## 2014-07-29 ENCOUNTER — Emergency Department
Admission: EM | Admit: 2014-07-29 | Discharge: 2014-07-29 | Disposition: A | Discharge status: Home / Self Care | Payer: Medicare Other | Attending: Emergency Medicine | Admitting: Emergency Medicine

## 2014-07-29 DIAGNOSIS — I10 Essential (primary) hypertension: Secondary | ICD-10-CM | POA: Insufficient documentation

## 2014-07-29 DIAGNOSIS — Z79899 Other long term (current) drug therapy: Secondary | ICD-10-CM | POA: Diagnosis not present

## 2014-07-29 DIAGNOSIS — S60562A Insect bite (nonvenomous) of left hand, initial encounter: Secondary | ICD-10-CM | POA: Diagnosis present

## 2014-07-29 DIAGNOSIS — Y9289 Other specified places as the place of occurrence of the external cause: Secondary | ICD-10-CM | POA: Insufficient documentation

## 2014-07-29 DIAGNOSIS — Y998 Other external cause status: Secondary | ICD-10-CM | POA: Diagnosis not present

## 2014-07-29 DIAGNOSIS — Z7982 Long term (current) use of aspirin: Secondary | ICD-10-CM | POA: Insufficient documentation

## 2014-07-29 DIAGNOSIS — W57XXXA Bitten or stung by nonvenomous insect and other nonvenomous arthropods, initial encounter: Secondary | ICD-10-CM | POA: Diagnosis not present

## 2014-07-29 DIAGNOSIS — Y9389 Activity, other specified: Secondary | ICD-10-CM | POA: Insufficient documentation

## 2014-07-29 MED ORDER — KETOROLAC TROMETHAMINE 30 MG/ML IJ SOLN
30.0000 mg | Freq: Once | INTRAMUSCULAR | Status: AC
Start: 2014-07-29 — End: 2014-07-29
  Administered 2014-07-29: 30 mg via INTRAVENOUS

## 2014-07-29 MED ORDER — KETOROLAC TROMETHAMINE 30 MG/ML IJ SOLN
INTRAMUSCULAR | Status: AC
  Filled 2014-07-29: qty 1

## 2014-07-29 MED ORDER — CEPHALEXIN 500 MG PO CAPS: 500.0000 mg | ORAL_CAPSULE | Freq: Four times a day (QID) | ORAL | Status: DC

## 2014-07-29 MED ORDER — IBUPROFEN 600 MG PO TABS: 600.0000 mg | ORAL_TABLET | Freq: Four times a day (QID) | ORAL | Status: DC | PRN

## 2014-07-29 NOTE — ED Notes (Signed)
Pt has insect bite to left hand.  Pt has swelling to left hand.   Sx since 1730 today

## 2014-07-29 NOTE — ED Notes (Signed)
Pt placed on med hold until 2250. Pt verbalized understanding, no further needs at this time

## 2014-07-29 NOTE — ED Provider Notes (Signed)
[facility] Emergency Department Provider Note  ____________________________________________  Time seen: Approximately 10:14 PM  I have reviewed the triage vital signs and the nursing notes.   HISTORY  Chief Complaint Insect Bite   HPI Timothy Riley is a 74 y.o. male who presents for evaluation of insect bite to his left hand. States onset this morning with complaints of increased swelling and pain to the hand. Recent heart surgery 2 this year.   Past Medical History  Diagnosis Date  . Hypertension     Patient Active Problem List   Diagnosis Date Noted  . 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 Heart valve replacment      Current Outpatient Rx  Name  Route  Sig  Dispense  Refill  . amLODipine (NORVASC) 5 MG tablet   Oral   Take 5 mg by mouth daily. In the morning         . aspirin 81 MG tablet   Oral   Take 81 mg by mouth daily. At bed time.         . cephALEXin (KEFLEX) 500 MG capsule   Oral   Take 1 capsule (500 mg total) by mouth 4 (four) times daily.   40 capsule   0   . glipiZIDE (GLUCOTROL) 5 MG tablet   Oral   Take 5 mg by mouth 2 (two) times daily.         Marland Kitchen ibuprofen (ADVIL,MOTRIN) 600 MG tablet   Oral   Take 1 tablet (600 mg total) by mouth every 6 (six) hours as needed.   30 tablet   0   . lisinopril (PRINIVIL,ZESTRIL) 20 MG tablet   Oral   Take 20 mg by mouth daily. Orally once a day at (bedtime)         . metoprolol succinate (TOPROL-XL) 100 MG 24 hr tablet   Oral   Take 100 mg by mouth daily. Take with or immediately following a meal. Once a day in the morning.           Allergies Hydralazine hcl  No family history on file.  Social History History  Substance Use Topics  . Smoking status: Never Smoker   . Smokeless tobacco: Not on file  . Alcohol Use: No    Review of Systems Constitutional: No fever/chills Eyes: No visual changes. ENT: No sore  throat. Cardiovascular: Denies chest pain. Respiratory: Denies shortness of breath. Gastrointestinal: No abdominal pain.  No nausea, no vomiting.  No diarrhea.  No constipation. Genitourinary: Negative for dysuria. Musculoskeletal: Negative for back pain. Skin: Negative for rash. Positive for insect bite dorsum left hand Neurological: Negative for headaches, focal weakness or numbness.  10-point ROS otherwise negative.  ____________________________________________   PHYSICAL EXAM:  VITAL SIGNS: ED Triage Vitals  Enc Vitals Group     BP 07/29/14 2139 165/87 mmHg     Pulse Rate 07/29/14 2139 61     Resp 07/29/14 2139 20     Temp 07/29/14 2139 97.2 F (36.2 C)     Temp Source 07/29/14 2139 Oral     SpO2 07/29/14 2139 96 %     Weight 07/29/14 2139 167 lb (75.751 kg)     Height 07/29/14 2139 5\' 8"  (1.727 m)     Head Cir --      Peak Flow --      Pain Score 07/29/14 2141 7     Pain Loc --  Pain Edu? --      Excl. in [place]? --     Constitutional: Alert and oriented. Well appearing and in no acute distress..   Cardiovascular: Normal rate, regular rhythm. Grossly normal heart sounds.  Good peripheral circulation. Respiratory: Normal respiratory effort.  No retractions. Lungs CTAB. Musculoskeletal: No lower extremity tenderness nor edema.  No joint effusions. Neurologic:  Normal speech and language. No gross focal neurologic deficits are appreciated. Speech is normal. No gait instability. Skin:  Skin is warm, dry and intact. No rash noted. Positive erythema and edema noted to the dorsum of left hand. Psychiatric: Mood and affect are normal. Speech and behavior are normal.  ____________________________________________   LABS (all labs ordered are listed, but only abnormal results are displayed)  Labs Reviewed - No data to display ____________________________________________  EKG  Not  applicable ____________________________________________  RADIOLOGY  Deferred ____________________________________________   PROCEDURES  Procedure(s) performed: None  Critical Care performed: No  ____________________________________________   INITIAL IMPRESSION / ASSESSMENT AND PLAN / ED COURSE  Pertinent labs & imaging results that were available during my care of the patient were reviewed by me and considered in my medical decision making (see chart for details).  Insect bite to dorsum of left hand. Rx given for Keflex 500 mg 4 times a day and ibuprofen 600 mg 4 times a day. She is to continue Benadryl and ice over-the-counter return to the ER with any worsening symptomology. ____________________________________________   FINAL CLINICAL IMPRESSION(S) / ED DIAGNOSES  Final diagnoses:  Insect bite hand, left, initial encounter      Arlyss Repress, PA-C 07/29/14 [place], MD 07/29/14 [phone]

## 2014-07-29 NOTE — Discharge Instructions (Signed)

## 2014-08-24 ENCOUNTER — Encounter (INDEPENDENT_AMBULATORY_CARE_PROVIDER_SITE_OTHER): Payer: Self-pay

## 2014-08-24 ENCOUNTER — Ambulatory Visit (INDEPENDENT_AMBULATORY_CARE_PROVIDER_SITE_OTHER): Payer: Medicare Other | Admitting: Cardiovascular Disease

## 2014-08-24 ENCOUNTER — Encounter: Payer: Self-pay | Admitting: Cardiovascular Disease

## 2014-08-24 VITALS — BP 140/76 | HR 61 | Ht 67.0 in | Wt 172.5 lb

## 2014-08-24 DIAGNOSIS — Z9889 Other specified postprocedural states: Secondary | ICD-10-CM | POA: Diagnosis not present

## 2014-08-24 DIAGNOSIS — I10 Essential (primary) hypertension: Secondary | ICD-10-CM | POA: Insufficient documentation

## 2014-08-24 DIAGNOSIS — Z954 Presence of other heart-valve replacement: Secondary | ICD-10-CM

## 2014-08-24 DIAGNOSIS — IMO0002 Reserved for concepts with insufficient information to code with codable children: Secondary | ICD-10-CM

## 2014-08-24 DIAGNOSIS — Z952 Presence of prosthetic heart valve: Secondary | ICD-10-CM | POA: Insufficient documentation

## 2014-08-24 DIAGNOSIS — I359 Nonrheumatic aortic valve disorder, unspecified: Secondary | ICD-10-CM | POA: Diagnosis not present

## 2014-08-24 DIAGNOSIS — I471 Supraventricular tachycardia: Secondary | ICD-10-CM

## 2014-08-24 DIAGNOSIS — Z23 Encounter for immunization: Secondary | ICD-10-CM | POA: Insufficient documentation

## 2014-08-24 DIAGNOSIS — E1165 Type 2 diabetes mellitus with hyperglycemia: Secondary | ICD-10-CM

## 2014-08-24 MED ORDER — AMLODIPINE BESYLATE 5 MG PO TABS: 5.0000 mg | ORAL_TABLET | Freq: Every day | ORAL | Status: DC

## 2014-08-24 MED ORDER — LISINOPRIL 40 MG PO TABS: 40.0000 mg | ORAL_TABLET | Freq: Every day | ORAL | Status: DC

## 2014-08-24 MED ORDER — GLIPIZIDE 10 MG PO TABS: 10.0000 mg | ORAL_TABLET | Freq: Two times a day (BID) | ORAL | Status: DC

## 2014-08-24 MED ORDER — METOPROLOL SUCCINATE ER 50 MG PO TB24: 50.0000 mg | ORAL_TABLET | Freq: Every day | ORAL | Status: DC

## 2014-08-24 NOTE — Patient Instructions (Addendum)
You are doing well.  Take chlorthalidone as needed for ankle swelling Hold the spironolactone  Continue lisinopril 40 mg daily in the AM Continue amlodipine 5 mg daily in the AM Decrease the metoprolol down to 50 mg at night (cut in 1/2 to start)  Please call us if you have new issues that need to be addressed before your next appt.  Your physician wants you to follow-up in: 6 months.  You will receive a reminder letter in the mail two months in advance. If you don't receive a letter, please call our office to schedule the follow-up appointment.

## 2014-08-24 NOTE — Assessment & Plan Note (Signed)
He feels overmedicated  And blood pressure has been running low. He reports recent systolic pressure in the 0000000, likely exacerbated by tamsulosin. We have recommended he hold the spur lactone, hold the chlorthalidone Recommended he stay on amlodipine 5 mill grams daily, lisinopril 40 mg daily, decrease metoprolol succinate down to 25 mg once a day. Encouraged him to monitor his pressure at home

## 2014-08-24 NOTE — Assessment & Plan Note (Signed)
He reports sugars typically in the low 200s. He is requesting higher dose glipizide. Currently takes 5 mg twice a day. We have provided a prescription for 10 mg twice a day until he is able to see primary care next week.

## 2014-08-24 NOTE — Assessment & Plan Note (Signed)
Tolerated AVR well 03/2014. We'll recommend repeat echocardiogram in February 2017

## 2014-08-24 NOTE — Progress Notes (Signed)
Patient ID: Timothy Riley, male    DOB: 04/07/1940, 74 y.o.   MRN: [id number]  HPI Comments: Timothy Riley is a very pleasant 74 year old gentleman with history of aortic valve stenosis, status post aVR 03/01/2014 at [facility] with bovine valve, history of SVT episodes since his AVR, requiring ablation 06/30/2014 again at [facility], also with diabetes type 2 with most recent hemoglobin A1c 7.8, BPH, normal ejection fraction who presents to establish care in the [place] office.  Timothy Riley feels that Timothy Riley has been overmedicated recently. Timothy Riley was started on chlorthalidone and spironolactone by EP at [facility]. Primary care added amlodipine, lisinopril with titration upwards to 40 mg with high-dose metoprolol succinate 50 mg twice a day for blood pressure. Tamsulosin recently added by urology for BPH symptoms. Recently reports having hypotension particularly in the morning with systolic pressures sometimes down to 90. Occasional lightheadedness. Denies having any lower extremity edema in the past, no shortness of breath.  Prior episodes of SVT requiring adenosine Timothy Riley denies any significant CAD by prior catheterization.  EKG on today's visit shows normal sinus rhythm with rate 61 bpm, right bundle branch block   Allergies  Allergen Reactions  . Hydralazine Hcl Rash    Outpatient Encounter Prescriptions as of 08/24/2014  Medication Sig  . amLODipine (NORVASC) 5 MG tablet Take 1 tablet (5 mg total) by mouth daily.  Marland Kitchen aspirin 81 MG tablet Take 81 mg by mouth daily. At bed time.  Marland Kitchen lisinopril (PRINIVIL,ZESTRIL) 40 MG tablet Take 1 tablet (40 mg total) by mouth daily.  . chlorthalidone (HYGROTON) 25 MG tablet Take 25 mg by mouth daily.  Marland Kitchen glipiZIDE (GLUCOTROL) 5 MG tablet Take 5 mg by mouth 2 (two) times daily.  . metoprolol succinate (TOPROL-XL) 100 MG 24 hr tablet Take 50 mg by mouth 2 (two) times daily. Take with or immediately following a meal. Once a day in the morning.  Marland Kitchen  spironolactone (ALDACTONE) 25 MG tablet  Take 25 mg by mouth daily.  . tamsulosin (FLOMAX) 0.4 MG CAPS capsule Take 1 capsule (0.4 mg total) by mouth.   No facility-administered encounter medications on file as of 08/24/2014.    Past Medical History  Diagnosis Date  . Hypertension   . Diabetes mellitus without complication     Past Surgical History  Procedure Laterality Date  . Heart valve replacment    . Cardiac catheterization      Social History  reports that Timothy Riley has never smoked. Timothy Riley does not have any smokeless tobacco history on file. Timothy Riley reports that Timothy Riley does not drink alcohol or use illicit drugs.  Family History family history includes Cancer in his mother; Stroke in his father.  Review of Systems  Constitutional: Negative.   Respiratory: Negative.   Cardiovascular: Negative.   Gastrointestinal: Negative.   Musculoskeletal: Negative.   Neurological: Negative.   Hematological: Negative.   Psychiatric/Behavioral: Negative.   All other systems reviewed and are negative.   BP 140/76 mmHg  Pulse 61  Ht 5\' 7"  (1.702 m)  Wt 172 lb 8 oz (78.245 kg)  BMI 27.01 kg/m2   Physical Exam  Constitutional: Timothy Riley is oriented to person, place, and time. Timothy Riley appears well-developed and well-nourished.  HENT:  Head: Normocephalic.  Nose: Nose normal.  Mouth/Throat: Oropharynx is clear and moist.  Eyes: Conjunctivae are normal. Pupils are equal, round, and reactive to light.  Neck: Normal range of motion. Neck supple. No JVD present.  Cardiovascular: Normal rate, regular rhythm, S1 normal, S2 normal and intact distal  pulses.  Exam reveals no gallop and no friction rub.   Murmur heard.  Systolic murmur is present with a grade of 1/6  Pulmonary/Chest: Effort normal and breath sounds normal. No respiratory distress. Timothy Riley has no wheezes. Timothy Riley has no rales. Timothy Riley exhibits no tenderness.  Abdominal: Soft. Bowel sounds are normal. Timothy Riley exhibits no distension. There is no tenderness.  Musculoskeletal: Normal range of motion. Timothy Riley exhibits no  edema or tenderness.  Lymphadenopathy:    Timothy Riley has no cervical adenopathy.  Neurological: Timothy Riley is alert and oriented to person, place, and time. Coordination normal.  Skin: Skin is warm and dry. No rash noted. No erythema.  Psychiatric: Timothy Riley has a normal mood and affect. His behavior is normal. Judgment and thought content normal.      Assessment and Plan   Nursing note and vitals reviewed.

## 2014-08-24 NOTE — Assessment & Plan Note (Signed)
Several episodes of SVT following valve surgery. Finally referred for ablation

## 2014-08-24 NOTE — Assessment & Plan Note (Signed)
SVT ablation was successful per the notes No recurrent arrhythmia

## 2014-08-27 ENCOUNTER — Ambulatory Visit (INDEPENDENT_AMBULATORY_CARE_PROVIDER_SITE_OTHER): Payer: Medicare Other | Admitting: Family Medicine

## 2014-08-27 ENCOUNTER — Encounter: Payer: Self-pay | Admitting: Family Medicine

## 2014-08-27 ENCOUNTER — Encounter (INDEPENDENT_AMBULATORY_CARE_PROVIDER_SITE_OTHER): Payer: Self-pay

## 2014-08-27 VITALS — BP 142/78 | HR 86 | Temp 97.8°F | Resp 16 | Ht 67.0 in | Wt 172.6 lb

## 2014-08-27 DIAGNOSIS — Z86010 Personal history of colonic polyps: Secondary | ICD-10-CM | POA: Insufficient documentation

## 2014-08-27 DIAGNOSIS — Z9889 Other specified postprocedural states: Secondary | ICD-10-CM

## 2014-08-27 DIAGNOSIS — E119 Type 2 diabetes mellitus without complications: Secondary | ICD-10-CM

## 2014-08-27 DIAGNOSIS — E782 Mixed hyperlipidemia: Secondary | ICD-10-CM

## 2014-08-27 DIAGNOSIS — Z23 Encounter for immunization: Secondary | ICD-10-CM | POA: Diagnosis not present

## 2014-08-27 DIAGNOSIS — N182 Chronic kidney disease, stage 2 (mild): Secondary | ICD-10-CM

## 2014-08-27 DIAGNOSIS — I129 Hypertensive chronic kidney disease with stage 1 through stage 4 chronic kidney disease, or unspecified chronic kidney disease: Secondary | ICD-10-CM | POA: Diagnosis not present

## 2014-08-27 MED ORDER — SITAGLIPTIN PHOSPHATE 50 MG PO TABS: 50.0000 mg | ORAL_TABLET | Freq: Every day | ORAL | Status: DC

## 2014-08-27 NOTE — Progress Notes (Signed)
Name: Timothy Riley   MRN: [id number]    DOB: 10-24-1940   Date:08/27/2014       Progress Note  Subjective  Chief Complaint  Chief Complaint  Patient presents with  . Establish Care    HPI   Heart history: Mr. Mamone is a pleasant 74 year old gentleman with history of aortic valve stenosis, status post aVR 03/01/2014 at [facility] with bovine valve, history of SVT episodes since his AVR, requiring ablation 06/30/2014 again at [facility], also with diabetes type 2 with most recent hemoglobin A1c 7.8%, BPH, normal ejection fraction who presents to establish care with me for primary care services. He has needed adenosine in the past for SVT and is currently established with Dr. Ida Rogue for Cardiology services. He continues to be asymptomatic and denies chest pain, palpitations, lower extremity edema, shortness of breat, dizziness. He works out at a gym regularly and [employer].   Patient is here for routine follow up of Hypertension. First diagnosed with hypertension several years ago. Current anti-hypertension medication regimen includes dietary modification, weight management and Lisinopril 40mg  daily, Toprol XL 50mg  daily, amlodipine 5mg , chlorthalidone 25mg  daily prn swelling. Spironolactone has been discontinued by Cardiologist 07/2014. Patient is following physician recommended management. Checking blood pressure outside of physician office. Results average systolic [id number] and average diastolic [id number]. Associated symptoms do not include headache, dizziness, nausea, lower extremity swelling, shortness of breath, chest pain, numbness. EKG done 08/24/14 at Cardiology office shows NSR with rate 61 bpm, RBBB. He tends to have a bit of white coat syndrome as well he notes.  Patient is here for routine follow up of Diabetes Type II.  Current diabetes medication regimen includes Glipizide 10mg  twice a day. Patient is taking medications as instructed. Overall the patient feels that their blood glucose is not well  controlled. Checking blood glucose 2 times a day consistently. Fasting glucose range 190-210. Meal time glucose range up to 280s. Lowest glucose result 172. He was previously on  Metformin, which has been discontinued for some times. He expresses frustration that he is concerned about his increasing sugars but no new medications have been started for him. He denies fevers, chills, lethargy, changes in urination or changes in diet.  Urinary dribbling: Working with Urologist, normal PSA and Ucx and UA but his urologist wants him to do a voiding study which he is planning on deferring as his symptoms are better with the use of tamsulosin and mostly at night.     Past Medical History  Diagnosis Date  . Hypertension   . Diabetes mellitus without complication   . Urinary incontinence     Past Surgical History  Procedure Laterality Date  . Heart valve replacment    . Cardiac catheterization    . Aortic valve replacement      Family History  Problem Relation Age of Onset  . Cancer Mother   . Stroke Father     History   Social History  . Marital Status: Married    Spouse Name: N/A  . Number of Children: N/A  . Years of Education: N/A   Occupational History  . Not on file.   Social History Main Topics  . Smoking status: Former Research scientist (life sciences)  . Smokeless tobacco: Not on file     Comment: over 40 yrs ago  . Alcohol Use: No  . Drug Use: No  . Sexual Activity: No   Other Topics Concern  . Not on file   Social History Narrative  Current outpatient prescriptions:  .  amLODipine (NORVASC) 5 MG tablet, Take 1 tablet (5 mg total) by mouth daily., Disp: 30 tablet, Rfl: 11 .  aspirin 81 MG tablet, Take 81 mg by mouth daily. At bed time., Disp: , Rfl:  .  chlorthalidone (HYGROTON) 25 MG tablet, Take 25 mg by mouth daily., Disp: , Rfl:  .  glipiZIDE (GLUCOTROL) 10 MG tablet, Take 1 tablet (10 mg total) by mouth 2 (two) times daily before a meal., Disp: 60 tablet, Rfl: 1 .  lisinopril  (PRINIVIL,ZESTRIL) 40 MG tablet, Take 1 tablet (40 mg total) by mouth daily., Disp: 30 tablet, Rfl: 11 .  metoprolol succinate (TOPROL-XL) 50 MG 24 hr tablet, Take 1 tablet (50 mg total) by mouth daily., Disp: 30 tablet, Rfl: 11 .  spironolactone (ALDACTONE) 25 MG tablet, Take 25 mg by mouth daily., Disp: , Rfl:  .  tamsulosin (FLOMAX) 0.4 MG CAPS capsule, Take 1 capsule (0.4 mg total) by mouth., Disp: 30 capsule, Rfl:   Allergies  Allergen Reactions  . Hydralazine Hcl Rash     ROS  CONSTITUTIONAL: No significant weight changes, fever, chills, weakness or fatigue.  HEENT:  - Eyes: No visual changes.  - Ears: No auditory changes. No pain.  - Nose: No sneezing, congestion, runny nose. - Throat: No sore throat. No changes in swallowing. SKIN: No rash or itching.  CARDIOVASCULAR: No chest pain, chest pressure or chest discomfort. No palpitations or edema.  RESPIRATORY: No shortness of breath, cough or sputum.  GASTROINTESTINAL: No anorexia, nausea, vomiting. No changes in bowel habits. No abdominal pain or blood.  GENITOURINARY: No dysuria. No frequency. No discharge.  NEUROLOGICAL: No headache, dizziness, syncope, paralysis, ataxia, numbness or tingling in the extremities. No memory changes. No change in bowel or bladder control.  MUSCULOSKELETAL: No joint pain. No muscle pain. HEMATOLOGIC: No anemia, bleeding or bruising.  LYMPHATICS: No enlarged lymph nodes.  PSYCHIATRIC: No change in mood. No change in sleep pattern.  ENDOCRINOLOGIC: No reports of sweating, cold or heat intolerance. No polyuria or polydipsia.   Objective  Filed Vitals:   08/27/14 0900  BP: 142/78  Pulse: 86  Temp: 97.8 F (36.6 C)  TempSrc: Oral  Resp: 16  Height: 5\' 7"  (1.702 m)  Weight: 172 lb 9.6 oz (78.291 kg)  SpO2: 94%   Body mass index is 27.03 kg/(m^2).  Physical Exam  Constitutional: Patient appears well-developed and well-nourished. In no distress.  HEENT:  - Head: Normocephalic and  atraumatic.  - Ears: Bilateral TMs gray, no erythema or effusion - Nose: Nasal mucosa moist - Mouth/Throat: Oropharynx is clear and moist. No tonsillar hypertrophy or erythema. No post nasal drainage.  - Eyes: Conjunctivae clear, EOM movements normal. PERRLA. No scleral icterus.  Neck: Normal range of motion. Neck supple. No JVD present. No thyromegaly present.  Cardiovascular: Normal rate, regular rhythm with a background 1/6 SEM heart best at the right of the sternum suggesting aortic valve pathology. Pulmonary/Chest: Effort normal and breath sounds normal. No respiratory distress. Musculoskeletal: Normal range of motion bilateral UE and LE, no joint effusions. Peripheral vascular: Bilateral LE no edema. Neurological: CN II-XII grossly intact with no focal deficits. Alert and oriented to person, place, and time. Coordination, balance, strength, speech and gait are normal.  Skin: Skin is warm and dry. No rash noted. No erythema.  Psychiatric: Patient has a normal mood and affect. Behavior is normal in office today. Judgment and thought content normal in office today.   Assessment & Plan  1. Diabetes mellitus type 2, controlled, without complications Patient's [id number] goal <7% is acceptable and <8% is reasonable due to elderly age and is at risk for hypoglycemic events. I have expressed to Mr. Tiller that I do not prefer Glipizide as a medication regimen for his age group due to risk of hypoglycemia but he has been on this medication for years and wants to continue it with addition of Januvia 50mg  one a day (renal dosing). I did consider adding a SGLT2 medication due to recent literature showing benefits however this medication class was not on formulary according to [facility] EMR when I tried ordering it.    Encouraged patient to continue efforts on checking blood glucose on a daily basis. Fasting blood glucose control goal is 100-130mg /dL and post prandial blood glucose control is  180mg /dL.  Reviewed diet, exercise, lifestyle changes and current medication regimen pertaining to diabetes with the patient.     - sitaGLIPtin (JANUVIA) 50 MG tablet; Take 1 tablet (50 mg total) by mouth daily.  Dispense: 30 tablet; Refill: 4  2. Need for pneumococcal vaccination  - Pneumococcal conjugate vaccine 13-valent  3. Combined fat and carbohydrate induced hyperlipemia He will not use statin, encouraged him to reconsider.   4. Benign hypertension with CKD (chronic kidney disease), stage II Patient was not aware of this diagnosis, reviewed lab work with him and most likely he is not on Metformin due to this. Continue to monitor.

## 2014-08-30 ENCOUNTER — Telehealth: Payer: Self-pay | Admitting: Family Medicine

## 2014-08-30 NOTE — Telephone Encounter (Signed)
Patient has been experiencing low blood pressure. It is currently 101/60. Please advise

## 2014-08-30 NOTE — Telephone Encounter (Signed)
Please ask patient to stop taking his amlodipine 5mg  medication and monitor his blood pressure.

## 2014-08-30 NOTE — Telephone Encounter (Signed)
I tried to contact this patient to review the message below, but there was no answer.  A message was left for him instructing him to stop taking the Amlodipine as directed by Dr. Nadine Counts and for him to give [place] a call if that helps but if it continues to decrease to go to the nearest ER.

## 2014-09-13 ENCOUNTER — Ambulatory Visit: Payer: Self-pay | Admitting: Family Medicine

## 2014-10-18 ENCOUNTER — Other Ambulatory Visit: Payer: Self-pay | Admitting: Cardiovascular Disease

## 2014-10-21 ENCOUNTER — Telehealth: Payer: Self-pay | Admitting: *Deleted

## 2014-10-21 ENCOUNTER — Other Ambulatory Visit: Payer: Self-pay

## 2014-10-21 NOTE — Telephone Encounter (Signed)
°  1. Which medications need to be refilled? GlipiZIDE  10 mg   2. Which pharmacy is medication to be sent to? [facility]   3. Do they need a 30 day or 90 day supply? 90   4. Would they like a call back once the medication has been sent to the pharmacy? No

## 2014-10-26 ENCOUNTER — Other Ambulatory Visit: Payer: Self-pay

## 2014-10-26 ENCOUNTER — Other Ambulatory Visit: Payer: Self-pay | Admitting: Family Medicine

## 2014-10-26 NOTE — Telephone Encounter (Signed)
Refill request was sent to Dr. Ashany Sundaram for approval and submission.  

## 2014-10-26 NOTE — Telephone Encounter (Signed)
Patient is requesting a refill on Glipizide 10mg . Please send to [facility].

## 2014-10-27 MED ORDER — GLIPIZIDE 10 MG PO TABS: 10.0000 mg | ORAL_TABLET | Freq: Two times a day (BID) | ORAL | Status: DC

## 2014-12-28 ENCOUNTER — Ambulatory Visit (INDEPENDENT_AMBULATORY_CARE_PROVIDER_SITE_OTHER): Payer: Medicare Other | Admitting: Family Medicine

## 2014-12-28 ENCOUNTER — Encounter: Payer: Self-pay | Admitting: Family Medicine

## 2014-12-28 VITALS — BP 156/88 | HR 70 | Temp 98.7°F | Resp 16 | Wt 179.6 lb

## 2014-12-28 DIAGNOSIS — I10 Essential (primary) hypertension: Secondary | ICD-10-CM | POA: Diagnosis not present

## 2014-12-28 DIAGNOSIS — Z23 Encounter for immunization: Secondary | ICD-10-CM

## 2014-12-28 DIAGNOSIS — E119 Type 2 diabetes mellitus without complications: Secondary | ICD-10-CM

## 2014-12-28 LAB — POCT GLYCOSYLATED HEMOGLOBIN (HGB A1C): Hemoglobin A1C: 7.4

## 2014-12-28 LAB — POCT UA - MICROALBUMIN: MICROALBUMIN (UR) POC: 100 mg/L

## 2014-12-28 MED ORDER — SITAGLIPTIN PHOSPHATE 50 MG PO TABS: 50.0000 mg | ORAL_TABLET | Freq: Every day | ORAL | Status: DC

## 2014-12-28 MED ORDER — AMLODIPINE BESYLATE 10 MG PO TABS: 10.0000 mg | ORAL_TABLET | Freq: Every day | ORAL | Status: DC

## 2014-12-28 MED ORDER — METOPROLOL SUCCINATE ER 50 MG PO TB24: 50.0000 mg | ORAL_TABLET | Freq: Every day | ORAL | Status: DC

## 2014-12-28 MED ORDER — AMLODIPINE BESYLATE 5 MG PO TABS: 5.0000 mg | ORAL_TABLET | Freq: Every day | ORAL | Status: DC

## 2014-12-28 MED ORDER — AMLODIPINE BESYLATE 10 MG PO TABS: 5.0000 mg | ORAL_TABLET | Freq: Every day | ORAL | Status: DC

## 2014-12-28 MED ORDER — GLIPIZIDE 10 MG PO TABS: 10.0000 mg | ORAL_TABLET | Freq: Two times a day (BID) | ORAL | Status: DC

## 2014-12-28 MED ORDER — LISINOPRIL 40 MG PO TABS: 40.0000 mg | ORAL_TABLET | Freq: Every day | ORAL | Status: DC

## 2014-12-28 NOTE — Progress Notes (Signed)
Name: Timothy Riley   MRN: [id number]    DOB: [birth year]   Date:12/28/2014       Progress Note  Subjective  Chief Complaint  Chief Complaint  Patient presents with  . 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 He has needed adenosine in the past for SVT and is currently established with Dr. Ida Rogue for Cardiology services. Outside information suggest he may have a diagnosis of Atrial Fibrillation as well but this needs to be confirmed. Has not seen Cardiologist since he last saw me 08/27/14. He continues to be asymptomatic and denies chest pain, palpitations, lower extremity edema, shortness of breat, dizziness. He works out at a gym regularly and [employer].   Patient is here for routine follow up of Hypertension. First diagnosed with hypertension several years ago. Current anti-hypertension medication regimen includes dietary modification, weight management and Lisinopril 40mg  daily, Toprol XL 50mg  daily, amlodipine 5mg , chlorthalidone 25mg  daily prn swelling. Spironolactone has been discontinued by Cardiologist 07/2014. Patient is following physician recommended management. Checking blood pressure outside of physician office. Results average systolic [id number] and average diastolic [id number]. Associated symptoms do not include headache, dizziness, nausea, lower extremity swelling, shortness of breath, chest pain, numbness. EKG done 08/24/14 at Cardiology office shows NSR with rate 61 bpm, RBBB. He tends to have a bit of white coat syndrome as well he notes.  Patient is here for routine follow up of Diabetes Type II.  Current diabetes medication regimen  includes Glipizide 10mg  twice a day and at our last visit I added Januvia 50 mg a day. Patient is taking medications as instructed. Overall the patient feels that their blood glucose is well controlled. Checking blood glucose 2 times a day consistently. Fasting glucose range 180-210. Meal time glucose range up to 270s. Lowest glucose result 162. Timothy Riley states he is [place] and will not give up eating breads, pastas, and all the foods he loves. He was previously on Metformin, which has been discontinued for some time now. He denies fevers, chills, lethargy, changes in urination or changes in diet.  Patient reports no longer taking Tamsulosin 0.4 mg Capsules. Occasional bilateral shoulder aches since the winter months started. Still working out at [employer].  HE IS REQUESTING 90 DAY SUPPLY REFILL OF ALL MEDICATIONS.  Past Medical History  Diagnosis Date  . Hypertension   . Diabetes mellitus without complication ([place])   . Urinary incontinence     Patient Active Problem List   Diagnosis Date Noted  . Encounter for immunization 12/28/2014  . H/O colonoscopy with polypectomy 08/27/2014  . S/P AVR (aortic valve replacement) 08/24/2014  . 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  . Hypertension goal BP (blood pressure) < 140/90 08/24/2014  . Need for pneumococcal vaccination 08/24/2014  . Benign hypertension with CKD (chronic kidney disease), stage II 06/17/2014  . H/O paroxysmal supraventricular tachycardia 05/31/2014  . Sustained SVT ([place]) 05/29/2014  . Awareness of heartbeats 03/31/2014  . 1St degree AV block 03/04/2014  . 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 Arteriosclerosis of coronary artery 01/11/2014  . Aortic heart valve narrowing 12/31/2013  . Benign prostatic hyperplasia (BPH)  with post-void dribbling 12/01/2013  . 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 Combined fat and carbohydrate induced hyperlipemia 12/01/2013  . Diabetes mellitus type 2,  controlled, without complications ([place]) 0000000    Social History  Substance Use Topics  . Smoking status: Former Research scientist (life sciences)  . Smokeless tobacco: Not on file     Comment: over 40 yrs ago  . Alcohol Use: No     Current outpatient prescriptions:  .  amLODipine (NORVASC) 5 MG tablet, Take 1 tablet (5 mg total) by mouth daily., Disp: 30 tablet, Rfl: 11 .  aspirin 81 MG tablet, Take 81 mg by mouth daily. At bed time., Disp: , Rfl:  .  glipiZIDE (GLUCOTROL) 10 MG tablet, Take 1 tablet (10 mg total) by mouth 2 (two) times daily before a meal., Disp: 180 tablet, Rfl: 3 .  lisinopril (PRINIVIL,ZESTRIL) 40 MG tablet, Take 1 tablet (40 mg total) by mouth daily., Disp: 30 tablet, Rfl: 11 .  metoprolol succinate (TOPROL-XL) 50 MG 24 hr tablet, Take 1 tablet (50 mg total) by mouth daily., Disp: 30 tablet, Rfl: 11 .  sitaGLIPtin (JANUVIA) 50 MG tablet, Take 1 tablet (50 mg total) by mouth daily., Disp: 30 tablet, Rfl: 4 .  tamsulosin (FLOMAX) 0.4 MG CAPS capsule, Take 1 capsule (0.4 mg total) by mouth., Disp: 30 capsule, Rfl:   Past Surgical History  Procedure Laterality Date  . Heart valve replacment    . Cardiac catheterization    . Aortic valve replacement      Family History  Problem Relation Age of Onset  . Cancer Mother   . Stroke Father     Allergies  Allergen Reactions  . Hydralazine Hcl Rash     Review of Systems  CONSTITUTIONAL: No significant weight changes, fever, chills, weakness or fatigue.  CARDIOVASCULAR: No chest pain, chest pressure or chest discomfort. No palpitations or edema.  RESPIRATORY: No shortness of breath, cough or sputum.  NEUROLOGICAL: No headache, dizziness, syncope, paralysis, ataxia, numbness or tingling in the extremities. No memory changes. No change in bowel or bladder control.  MUSCULOSKELETAL: No joint pain. No muscle pain. HEMATOLOGIC: No anemia, bleeding or bruising.  LYMPHATICS: No enlarged lymph nodes.  PSYCHIATRIC: No change in mood. No  change in sleep pattern.  ENDOCRINOLOGIC: No reports of sweating, cold or heat intolerance. No polyuria or polydipsia.     Objective  BP 156/88 mmHg  Pulse 70  Temp(Src) 98.7 F (37.1 C) (Oral)  Resp 16  Wt 179 lb 9.6 oz (81.466 kg)  SpO2 96% Body mass index is 28.12 kg/(m^2).  Physical Exam  Constitutional: Patient appears well-developed and well-nourished. In no distress.  Cardiovascular: Normal rate, regular rhythm with a background 1/6 SEM heart best at the right of the sternum suggesting aortic valve pathology. Pulmonary/Chest: Effort normal and breath sounds normal. No respiratory distress. Musculoskeletal: Normal range of motion bilateral UE and LE, no joint effusions. Peripheral vascular: Bilateral LE no edema. Neurological: CN II-XII grossly intact with no focal deficits. Alert and oriented to person, place, and time. Coordination, balance, strength, speech and gait are normal.  Skin: Skin is warm and dry. No rash noted. No erythema.  Psychiatric: Patient has a normal mood and affect. Behavior is normal in office today. Judgment and thought content normal in office today.  Diabetic Foot Exam - Simple   Simple Foot Form  Diabetic Foot exam was performed with the following findings:  Yes 12/28/2014 10:34 AM  Visual Inspection  No deformities, no ulcerations, no  other skin breakdown bilaterally:  Yes  Sensation Testing  Intact to touch and monofilament testing bilaterally:  Yes  Pulse Check  Posterior Tibialis and Dorsalis pulse intact bilaterally:  Yes  Comments     Results for orders placed or performed in visit on 12/28/14 (from the past 24 hour(s))  POCT glycosylated hemoglobin (Hb A1C)     Status: Abnormal   Collection Time: 12/28/14 10:09 AM  Result Value Ref Range   Hemoglobin A1C 7.4   POCT UA - Microalbumin     Status: Abnormal   Collection Time: 12/28/14 10:36 AM  Result Value Ref Range   Microalbumin Ur, POC 100 mg/L   Creatinine, POC  mg/dL    Albumin/Creatinine Ratio, Urine, POC      Assessment & Plan  1. Controlled type 2 diabetes mellitus without complication, without long-term current use of insulin (HCC) Patient's Hba1c goal <7% is acceptable and <8% is reasonable due to elderly age and is at risk for hypoglycemic events. Again I have expressed to Timothy Riley that I do not prefer Glipizide as a medication regimen for his age group due to risk of hypoglycemia but he has been on this medication for years and wants to continue it with Januvia 50mg  one a day (renal dosing). I did consider adding a SGLT2 medication due to recent literature showing benefits however this medication class was not on formulary according to [facility] EMR when I tried ordering it previously.   Encouraged patient to continue efforts on checking blood glucose on a daily basis. Fasting blood glucose control goal is 120-150mg /dL and post prandial blood glucose control is 180mg /dL.  Reviewed diet, exercise, lifestyle changes and current medication regimen pertaining to diabetes with the patient.   - POCT glycosylated hemoglobin (Hb A1C) - POCT UA - Microalbumin - sitaGLIPtin (JANUVIA) 50 MG tablet; Take 1 tablet (50 mg total) by mouth daily.  Dispense: 90 tablet; Refill: 3 - glipiZIDE (GLUCOTROL) 10 MG tablet; Take 1 tablet (10 mg total) by mouth 2 (two) times daily before a meal.  Dispense: 180 tablet; Refill: 3  2. Hypertension goal BP (blood pressure) < 140/90 Sub optimal. Increased amlodipine to 10 mg.  - lisinopril (PRINIVIL,ZESTRIL) 40 MG tablet; Take 1 tablet (40 mg total) by mouth daily.  Dispense: 90 tablet; Refill: 3 - metoprolol succinate (TOPROL-XL) 50 MG 24 hr tablet; Take 1 tablet (50 mg total) by mouth daily.  Dispense: 90 tablet; Refill: 3 - amLODipine (NORVASC) 10 MG tablet; Take 1 tablet (10 mg total) by mouth daily.  Dispense: 90 tablet; Refill: 3  3. Need for immunization against influenza Done, High dose.  4. Need for pneumococcal  vaccination Nursing staff administered vaccination prior to my review, interval between 13 valent and 23 valent about 5 months.  - Pneumococcal polysaccharide vaccine 23-valent greater than or equal to 2yo subcutaneous/IM

## 2015-02-11 ENCOUNTER — Telehealth: Payer: Self-pay

## 2015-02-11 MED ORDER — AMOXICILLIN 500 MG PO CAPS: 2000.0000 mg | ORAL_CAPSULE | Freq: Once | ORAL | Status: DC

## 2015-02-11 NOTE — Telephone Encounter (Signed)
Patient needs rx for antibiotic for dental appointment on Monday can you please send in for Dr. Nadine Counts?

## 2015-02-21 ENCOUNTER — Encounter: Payer: Medicare Other | Admitting: Family Medicine

## 2015-03-11 ENCOUNTER — Ambulatory Visit (INDEPENDENT_AMBULATORY_CARE_PROVIDER_SITE_OTHER): Payer: Medicare Other | Admitting: Cardiovascular Disease

## 2015-03-11 ENCOUNTER — Encounter: Payer: Self-pay | Admitting: Cardiovascular Disease

## 2015-03-11 VITALS — BP 150/70 | HR 64 | Ht 68.0 in | Wt 179.2 lb

## 2015-03-11 DIAGNOSIS — I471 Supraventricular tachycardia: Secondary | ICD-10-CM

## 2015-03-11 DIAGNOSIS — Z8679 Personal history of other diseases of the circulatory system: Secondary | ICD-10-CM

## 2015-03-11 DIAGNOSIS — Z952 Presence of prosthetic heart valve: Secondary | ICD-10-CM

## 2015-03-11 DIAGNOSIS — I10 Essential (primary) hypertension: Secondary | ICD-10-CM | POA: Diagnosis not present

## 2015-03-11 DIAGNOSIS — I440 Atrioventricular block, first degree: Secondary | ICD-10-CM | POA: Diagnosis not present

## 2015-03-11 DIAGNOSIS — Z954 Presence of other heart-valve replacement: Secondary | ICD-10-CM

## 2015-03-11 DIAGNOSIS — E119 Type 2 diabetes mellitus without complications: Secondary | ICD-10-CM

## 2015-03-11 MED ORDER — DOXAZOSIN MESYLATE 8 MG PO TABS: 8.0000 mg | ORAL_TABLET | Freq: Every day | ORAL | Status: DC

## 2015-03-11 NOTE — Assessment & Plan Note (Signed)
We have encouraged continued exercise, careful diet management in an effort to lose weight. 

## 2015-03-11 NOTE — Patient Instructions (Signed)
You are doing well.  Blood pressure is elevated, Please start doxazosin 1/2 pill in the morning, Watch the blood pressure for a few weeks, If it continues to run high, Go to a full pill in the morning   Please call us if you have new issues that need to be addressed before your next appt.  Your physician wants you to follow-up in: 6 months.  You will receive a reminder letter in the mail two months in advance. If you don't receive a letter, please call our office to schedule the follow-up appointment.

## 2015-03-11 NOTE — Progress Notes (Signed)
Patient ID: Timothy Riley, male    DOB: 10-12-1940, 75 y.o.   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 AVR, requiring ablation 06/30/2014 again at [facility], also with diabetes type 2 with most recent hemoglobin A1c 7.8, BPH, normal ejection fraction who presents for routine follow-up of his bioprosthetic valve, hypertension  In follow-up today, he reports that his blood pressure typically runs [id number] up to 0000000 systolic He was hypotensive on his last clinic visit, stopped his tamsulosin, chlorthalidone Overall feels well, no complaints, no exercise program Reports having tried a statin in the past, felt jittery  Previous notes from outside cardiologist says he has mild coronary artery disease, details not available through care everywhere Followed by endocrine for his diabetes, hemoglobin A1c of 7  EKG on today's visit shows normal sinus rhythm with rate 64 bpm, right bundle branch block  Other past medical history reviewed Prior episodes of SVT requiring adenosine He denies any significant CAD by prior catheterization.  EKG on today's visit shows normal sinus rhythm with rate 61 bpm, right bundle branch block   Allergies  Allergen Reactions  . Hydralazine Hcl Rash      Medication List       This list is accurate as of: 03/11/15  2:55 PM.  Always use your most recent med list.               amLODipine 10 MG tablet  Commonly known as:  NORVASC  Take 1 tablet (10 mg total) by mouth daily.     amoxicillin 500 MG capsule  Commonly known as:  AMOXIL  Take 4 capsules (2,000 mg total) by mouth once. 30 minutes before dental procedure     aspirin 81 MG tablet  Take 81 mg by mouth daily. At bed time.        glipiZIDE 10 MG tablet  Commonly known as:  GLUCOTROL  Take 1 tablet (10 mg total) by mouth 2 (two)  times daily before a meal.     lisinopril 40 MG tablet  Commonly known as:  PRINIVIL,ZESTRIL  Take 1 tablet (40 mg total) by mouth daily.     metoprolol succinate 50 MG 24 hr tablet  Commonly known as:  TOPROL-XL  Take 1 tablet (50 mg total) by mouth daily.     sitaGLIPtin 50 MG tablet  Commonly known as:  JANUVIA  Take 1 tablet (50 mg total) by mouth daily.        Past Medical History  Diagnosis Date  . Hypertension   . Diabetes mellitus without complication ([place])   . Urinary incontinence     Past Surgical History  Procedure Laterality Date  . Heart valve replacment    . Cardiac catheterization    . Aortic valve replacement      Social History  reports that he has quit smoking. He does not have any smokeless tobacco history on file. He reports that he does not drink alcohol or use illicit drugs.  Family History family history includes Cancer in his mother; Stroke in his father.  Review of Systems  Constitutional: Negative.   Respiratory: Negative.   Cardiovascular: Negative.   Gastrointestinal: Negative.   Musculoskeletal: Negative.   Neurological: Negative.   Hematological: Negative.   Psychiatric/Behavioral: Negative.   All other systems reviewed and are negative.  BP 150/70 mmHg  Pulse 64  Ht 5\' 8"  (1.727 m)  Wt 179 lb 4 oz (81.307 kg)  BMI 27.26 kg/m2   Physical Exam  Constitutional: He is oriented to person, place, and time. He appears well-developed and well-nourished.  HENT:  Head: Normocephalic.  Nose: Nose normal.  Mouth/Throat: Oropharynx is clear and moist.  Eyes: Conjunctivae are normal. Pupils are equal, round, and reactive to light.  Neck: Normal range of motion. Neck supple. No JVD present.  Cardiovascular: Normal rate, regular rhythm, S1 normal, S2 normal and intact distal pulses.  Exam reveals no gallop and no friction rub.   Murmur heard.  Systolic murmur is present with a grade of 1/6  Pulmonary/Chest: Effort normal and breath  sounds normal. No respiratory distress. He has no wheezes. He has no rales. He exhibits no tenderness.  Abdominal: Soft. Bowel sounds are normal. He exhibits no distension. There is no tenderness.  Musculoskeletal: Normal range of motion. He exhibits no edema or tenderness.  Lymphadenopathy:    He has no cervical adenopathy.  Neurological: He is alert and oriented to person, place, and time. Coordination normal.  Skin: Skin is warm and dry. No rash noted. No erythema.  Psychiatric: He has a normal mood and affect. His behavior is normal. Judgment and thought content normal.      Assessment and Plan   Nursing note and vitals reviewed.

## 2015-03-11 NOTE — Assessment & Plan Note (Signed)
Blood pressures running high at home, recommended he start Cardura 4 mg daily increase up to 8 mg to reach goal blood pressure less than [id number] systolic.  He does have nocturia,  Past 2 nights 5 times each night

## 2015-03-11 NOTE — Assessment & Plan Note (Signed)
He reports having no significant arrhythmia concerning for SVT

## 2015-03-11 NOTE — Assessment & Plan Note (Signed)
Clinically doing well, no change in symptoms  Suggested he consider repeat echocardiogram  In 2018

## 2015-03-13 ENCOUNTER — Telehealth: Payer: Self-pay | Admitting: Physician Assistant

## 2015-03-13 NOTE — Telephone Encounter (Signed)
[name] is a patient of Dr. Rockey Situ, he has history of severe AS s/p AVR at [facility] with bovine valve in February 2016, history of SVT requiring ablation, diabetes. He typically has difficult to control blood pressure and takes lisinopril and amlodipine in the morning and Toprol-XL at night. He states his blood pressure continued to be running in the Q000111Q systolic and Dr. Rockey Situ started him on new medication Cardura at 4 mg daily. Since starting the new medication, his blood pressure has dropped significantly. Yesterday his systolic blood pressure was in the 80s. After holding off his blood pressure medication, this morning his blood pressure has came up to 100 110s. He is very hesitant to take any of the blood pressure medication at this time due to low blood pressure. Otherwise he denies any significant discomfort including chest pain or shortness of breath. He does feel a low bit dizzy in the last 2 days, but no feeling of passing out.  I have instructed him to continue to monitor his blood pressure and hold his blood pressure medications until his blood pressure has come back up. If his dizziness get worse he will need to seek medical attention sooner. Still, I am very surprised his blood pressure dropped so much on Cardura alone which typically does not affect BP too much. However he is currently asymptomatic and does not seems to suggest if there is any other acute process going on at this time. His blood pressure has been improving since yesterday. I have instructed him to keep himself hydrated at this time and monitor for any worsening symptoms.  Hilbert Corrigan PA Pager: [phone]

## 2015-03-14 ENCOUNTER — Telehealth: Payer: Self-pay

## 2015-03-14 NOTE — Telephone Encounter (Signed)
Pt c/o BP issue: STAT if pt c/o blurred vision, one-sided weakness or slurred speech  1. What are your last 5 BP readings? Saturday 2/12-80/62, night 104/68, 101/62, Sunday, 104/68,114/62,106/82, Today, after medication 170/87.  2. Are you having any other symptoms (ex. Dizziness, headache, blurred vision, passed out)? Light headedness, "kinda out of sorts with it"  3. What is your BP issue? Too low  Hasnt taken any medication this weekend

## 2015-03-14 NOTE — Telephone Encounter (Signed)
Please see phone note where pt called this weekend.

## 2015-03-15 NOTE — Telephone Encounter (Signed)
Spoke w/ pt.  He states that he has "called 12 times and nobody has called me back.  I left a message over the weekend and haven't heard a word from anybody.  I wouldn't have called if I knew Dr. Rockey Situ had every Monday off." Advised pt that I see where PA discussed w/ him BP and made recommendations. Advised him that Dr. Rockey Situ had an emergency yesterday and this caused a delay in responding to messages.  He reports that his BP was low over the weekend and that the cardura 4 mg was too much for him.   He reports that his BP this am was 133/67 w/o cardura.   Pt takes amlodipine & lisinopril in the am, he would like to try taking cardura at night to see if this helps.  Advised him to try this, continue to monitor his BP and call if they continue to run low. He is agreeable and states that he "will just throw the cardura out the window" if readings do not improve.

## 2015-03-15 NOTE — Telephone Encounter (Signed)
Pt is calling back wanting his BP issues to be addressed. More BP readings: Yesterday 3:15 pm146/80, 89 HR, 5:30 pm 165/82, 76 HR, 12:45 180/85 This morning 182/85, 62 HR

## 2015-03-21 ENCOUNTER — Encounter: Payer: Medicare Other | Admitting: Family Medicine

## 2015-03-25 ENCOUNTER — Telehealth: Payer: Self-pay | Admitting: Cardiovascular Disease

## 2015-03-25 NOTE — Telephone Encounter (Signed)
S/w pt wife, Inez Catalina, as pt is not home at this time. Pt called to report BP readings (see below). Cardura 4mg  added at Feb 10 OV Per AVS: "Please start doxazosin 1/2 pill in the morning, Watch the blood pressure for a few weeks, If it continues to run high, Go to a full pill in the morning "  Advised wife to continue to monitor BP and continue 4mg  cardura. No need to titrate up to 8mg  at this time.  Wife verbalized understanding and is agreeable w/plan. Forward to MD to make aware and further recommendations.

## 2015-03-25 NOTE — Telephone Encounter (Signed)
Pt calling stating he was told to take Doxazosin  And he is taking 4 mg  Would like to give BP readings  03/20/15  108/60 HR 68 130pm 134/69 HR 67 3pm 03/21/15  1230 108/69 HR 69 230 pm 133/66 hr 64   7pm 148/69 hr 64 03/22/15  12pm 114/60 hr 67 5pm 135/65 hr 75 9pm 114/60 hr 84 03/23/15  1 PM 129/69 HR 67 930PM 145/67 HR 67 03/24/15: 630PM 105/65 HR 75 11PM  97/60 HR 69  03/25/15  1PM 105/65 HR 75  Has not taken medications yet Would like to know if this is normal for BP to jump around like this Please advise.

## 2015-03-27 NOTE — Telephone Encounter (Signed)
BP looks good  

## 2015-03-28 NOTE — Telephone Encounter (Signed)
Left detailed message and CB number on pt home VM

## 2015-03-29 ENCOUNTER — Encounter: Payer: Self-pay | Admitting: Family Medicine

## 2015-03-29 ENCOUNTER — Ambulatory Visit (INDEPENDENT_AMBULATORY_CARE_PROVIDER_SITE_OTHER): Payer: Medicare Other | Admitting: Family Medicine

## 2015-03-29 VITALS — BP 130/70 | HR 80 | Temp 96.5°F | Resp 14 | Wt 181.8 lb

## 2015-03-29 DIAGNOSIS — Z0000 Encounter for general adult medical examination without abnormal findings: Secondary | ICD-10-CM

## 2015-03-29 NOTE — Progress Notes (Signed)
Name: Timothy Riley   MRN: [id number]    DOB: 25-Mar-1940   Date:03/29/2015       Progress Note  Subjective  Chief Complaint  Chief Complaint  Patient presents with  . Annual Exam    HPI  Patient is here today for a Male Medicare Wellness Visit:  The patient has been in otherwise good general health and voices no acute concerns today. Does want to update me that Dr. Rockey Situ has him on Cardura 4mg  a day now and his BPs are more under control. Originally placed on Cardura 8mg  dose but this dropped BP too low so now he is taking 1/2 tab of 8mg  dose with no problems.   Past Medical History  Diagnosis Date  . Hypertension   . Diabetes mellitus without complication ([place])   . Urinary incontinence     Past Surgical History  Procedure Laterality Date  . Heart valve replacment    . Cardiac catheterization    . Aortic valve replacement      Family History  Problem Relation Age of Onset  . Cancer Mother   . Stroke Father     Social History   Social History  . Marital Status: Married    Spouse Name: N/A  . Number of Children: N/A  . Years of Education: N/A   Occupational History  . Not on file.   Social History Main Topics  . Smoking status: Former Research scientist (life sciences)  . Smokeless tobacco: Not on file     Comment: over 40 yrs ago  . Alcohol Use: No  . Drug Use: No  . Sexual Activity: No   Other Topics Concern  . Not on file   Social History Narrative    Current Outpatient Prescriptions on File Prior to Visit  Medication Sig Dispense Refill  . amLODipine (NORVASC) 10 MG tablet Take 1 tablet (10 mg total) by mouth daily. 90 tablet 3  . aspirin 81 MG tablet Take 81 mg by mouth daily. At bed time.    Marland Kitchen doxazosin (CARDURA) 8 MG tablet Take 1 tablet (8 mg total) by mouth daily. (Patient taking differently: 4 mg daily. ) 30 tablet 6  . glipiZIDE (GLUCOTROL) 10 MG tablet Take 1 tablet (10 mg total) by mouth 2 (two) times daily before a meal. 180 tablet 3  . lisinopril  (PRINIVIL,ZESTRIL) 40 MG tablet Take 1 tablet (40 mg total) by mouth daily. 90 tablet 3  . metoprolol succinate (TOPROL-XL) 50 MG 24 hr tablet Take 1 tablet (50 mg total) by mouth daily. 90 tablet 3  . sitaGLIPtin (JANUVIA) 50 MG tablet Take 1 tablet (50 mg total) by mouth daily. 90 tablet 3   No current facility-administered medications on file prior to visit.     Allergies  Allergen Reactions  . Hydralazine Hcl Rash    Fall Risk: Fall Risk  03/29/2015  Falls in the past year? No    Depression screen [facility] 2/9 03/29/2015 08/27/2014  Decreased Interest 0 0  Down, Depressed, Hopeless 0 0  PHQ - 2 Score 0 0   Functional Status Survey: Is the patient deaf or have difficulty hearing?: No Does the patient have difficulty seeing, even when wearing glasses/contacts?: Yes Does the patient have difficulty concentrating, remembering, or making decisions?: No Does the patient have difficulty walking or climbing stairs?: No Does the patient have difficulty dressing or bathing?: No Does the patient have difficulty doing errands alone such as visiting a doctor's office or shopping?: No   ROS  CONSTITUTIONAL: No significant weight changes, fever, chills, weakness or fatigue.  HEENT:  - Eyes: No visual changes.  - Ears: No auditory changes. No pain.  - Nose: No sneezing, congestion, runny nose. - Throat: No sore throat. No changes in swallowing. SKIN: No rash or itching.  CARDIOVASCULAR: No chest pain, chest pressure or chest discomfort. No palpitations or edema.  RESPIRATORY: No shortness of breath, cough or sputum.  GASTROINTESTINAL: No anorexia, nausea, vomiting. No changes in bowel habits. No abdominal pain or blood.  GENITOURINARY: No dysuria. No frequency. No discharge.  NEUROLOGICAL: No headache, dizziness, syncope, paralysis, ataxia, numbness or tingling in the extremities. No memory changes. No change in bowel or bladder control.  MUSCULOSKELETAL: No joint pain. No muscle  pain. HEMATOLOGIC: No anemia, bleeding or bruising.  LYMPHATICS: No enlarged lymph nodes.  PSYCHIATRIC: No change in mood. No change in sleep pattern.  ENDOCRINOLOGIC: No reports of sweating, cold or heat intolerance. No polyuria or polydipsia.   Objective  Filed Vitals:   03/29/15 1404  BP: 130/70  Pulse: 80  Temp: 96.5 F (35.8 C)  TempSrc: Oral  Resp: 14  Weight: 181 lb 12.8 oz (82.464 kg)  SpO2: 98%   Body mass index is 27.65 kg/(m^2).  Physical Exam  Constitutional: Patient appears well-developed and well-nourished. In no distress.  HEENT:  - Head: Normocephalic and atraumatic.  - Ears: Bilateral TMs gray, no erythema or effusion - Nose: Nasal mucosa moist - Mouth/Throat: Oropharynx is clear and moist. No tonsillar hypertrophy or erythema. No post nasal drainage.  - Eyes: Conjunctivae clear, EOM movements normal. PERRLA. No scleral icterus.  Neck: Normal range of motion. Neck supple. No JVD present. No thyromegaly present.  Cardiovascular: Normal rate, regular rhythm and normal heart sounds.  No murmur heard.  Pulmonary/Chest: Effort normal and breath sounds normal. No respiratory distress. Musculoskeletal: Normal range of motion bilateral UE and LE, no joint effusions. Peripheral vascular: Bilateral LE no edema. Neurological: CN II-XII grossly intact with no focal deficits. Alert and oriented to person, place, and time. Coordination, balance, strength, speech and gait are normal.  Skin: Skin is warm and dry. No rash noted. No erythema.  Psychiatric: Patient has a normal mood and affect. Behavior is normal in office today. Judgment and thought content normal in office today.   Assessment & Plan  1. Medicare annual wellness visit, subsequent Functional ability/safety issues: No Issues Hearing issues: Addressed  Activities of daily living: Discussed Home safety issues: No Issues  End Of Life Planning: Offered verbal information regarding advanced directives,  healthcare power of attorney.  Preventative care, Health maintenance, Preventative health measures discussed.  Preventative screenings discussed today: lab work, colonoscopy, PSA.  Men age 77 to 77 years if ever smoked recommended to get a one time AAA ultrasound screening exam.  Low Dose CT Chest recommended if Age 43-80 years, 30 pack-year currently smoking OR have quit w/in 15years.   Lifestyle risk factor issued reviewed: Diet, exercise, weight management, advised patient smoking is not healthy, nutrition/diet.  Preventative health measures discussed (5-10 year plan).  Reviewed and recommended vaccinations: - Pneumovax  - Prevnar  - Annual Influenza - Zostavax - Tdap   Depression screening: Done Fall risk screening: Done Discuss ADLs/IADLs: Done  Current medical providers: See HPI  Other health risk factors identified this visit: No other issues Cognitive impairment issues: None identified  All above discussed with patient. Appropriate education, counseling and referral will be made based upon the above.

## 2015-06-14 ENCOUNTER — Ambulatory Visit: Payer: Medicare Other | Admitting: Family Medicine

## 2015-09-26 ENCOUNTER — Ambulatory Visit (INDEPENDENT_AMBULATORY_CARE_PROVIDER_SITE_OTHER): Payer: Medicare Other | Admitting: Family Medicine

## 2015-09-26 ENCOUNTER — Encounter: Payer: Self-pay | Admitting: Family Medicine

## 2015-09-26 DIAGNOSIS — Z5181 Encounter for therapeutic drug level monitoring: Secondary | ICD-10-CM

## 2015-09-26 DIAGNOSIS — Z9889 Other specified postprocedural states: Secondary | ICD-10-CM | POA: Diagnosis not present

## 2015-09-26 DIAGNOSIS — E782 Mixed hyperlipidemia: Secondary | ICD-10-CM | POA: Diagnosis not present

## 2015-09-26 DIAGNOSIS — N401 Enlarged prostate with lower urinary tract symptoms: Secondary | ICD-10-CM | POA: Diagnosis not present

## 2015-09-26 DIAGNOSIS — N3943 Post-void dribbling: Secondary | ICD-10-CM | POA: Diagnosis not present

## 2015-09-26 DIAGNOSIS — I10 Essential (primary) hypertension: Secondary | ICD-10-CM

## 2015-09-26 DIAGNOSIS — E119 Type 2 diabetes mellitus without complications: Secondary | ICD-10-CM | POA: Diagnosis not present

## 2015-09-26 NOTE — Assessment & Plan Note (Signed)
Patient does not want to take a statin; limit saturated fats; check fasting lipids in the next week or two

## 2015-09-26 NOTE — Patient Instructions (Addendum)
If you need something for aches or pains, try to use Tylenol (acetaminphen) instead of non-steroidals (which include Aleve, ibuprofen, Advil, Motrin, and naproxen); non-steroidals can cause long-term kidney damage  Return in the next 1-2 weeks for fasting labs If afternoons are best, then okay to have breakfast and just skip lunch (6+ hours)  Try to limit saturated fats in your diet (bologna, hot dogs, barbeque, cheeseburgers, hamburgers, steak, bacon, sausage, cheese, etc.) and get more fresh fruits, vegetables, and whole grains  Please do see your eye doctor regularly, and have your eyes examined every year (or more often per his or her recommendation) Check your feet every night and let me know right away of any sores, infections, numbness, etc. Try to limit sweets, white bread, white rice, white potatoes It is okay with me for you to not check your fingerstick blood sugars (per [employer] of Endocrinology Best Practices), unless you are interested and feel it would be helpful for you DASH Eating Plan DASH stands for "Dietary Approaches to Stop Hypertension." The DASH eating plan is a healthy eating plan that has been shown to reduce high blood pressure (hypertension). Additional health benefits may include reducing the risk of type 2 diabetes mellitus, heart disease, and stroke. The DASH eating plan may also help with weight loss. WHAT DO I NEED TO KNOW ABOUT THE DASH EATING PLAN? For the DASH eating plan, you will follow these general guidelines:  Choose foods with a percent daily value for sodium of less than 5% (as listed on the food label).  Use salt-free seasonings or herbs instead of table salt or sea salt.  Check with your health care provider or pharmacist before using salt substitutes.  Eat lower-sodium products, often labeled as "lower sodium" or "no salt added."  Eat fresh foods.  Eat more vegetables, fruits, and low-fat dairy products.  Choose whole grains. Look for  the word "whole" as the first word in the ingredient list.  Choose fish and skinless chicken or [place] more often than red meat. Limit fish, poultry, and meat to 6 oz (170 g) each day.  Limit sweets, desserts, sugars, and sugary drinks.  Choose heart-healthy fats.  Limit cheese to 1 oz (28 g) per day.  Eat more home-cooked food and less restaurant, buffet, and fast food.  Limit fried foods.  Cook foods using methods other than frying.  Limit canned vegetables. If you do use them, rinse them well to decrease the sodium.  When eating at a restaurant, ask that your food be prepared with less salt, or no salt if possible. WHAT FOODS CAN I EAT? Seek help from a dietitian for individual calorie needs. Grains Whole grain or whole wheat bread. Brown rice. Whole grain or whole wheat pasta. Quinoa, bulgur, and whole grain cereals. Low-sodium cereals. Corn or whole wheat flour tortillas. Whole grain cornbread. Whole grain crackers. Low-sodium crackers. Vegetables Fresh or frozen vegetables (raw, steamed, roasted, or grilled). Low-sodium or reduced-sodium tomato and vegetable juices. Low-sodium or reduced-sodium tomato sauce and paste. Low-sodium or reduced-sodium canned vegetables.  Fruits All fresh, canned (in natural juice), or frozen fruits. Meat and Other Protein Products Ground beef (85% or leaner), grass-fed beef, or beef trimmed of fat. Skinless chicken or [place]. Ground chicken or [place]. Pork trimmed of fat. All fish and seafood. Eggs. Dried beans, peas, or lentils. Unsalted nuts and seeds. Unsalted canned beans. Dairy Low-fat dairy products, such as skim or 1% milk, 2% or reduced-fat cheeses, low-fat ricotta or cottage cheese, or  plain low-fat yogurt. Low-sodium or reduced-sodium cheeses. Fats and Oils Tub margarines without trans fats. Light or reduced-fat mayonnaise and salad dressings (reduced sodium). Avocado. Safflower, olive, or canola oils. Natural peanut or almond  butter. Other Unsalted popcorn and pretzels. The items listed above may not be a complete list of recommended foods or beverages. Contact your dietitian for more options. WHAT FOODS ARE NOT RECOMMENDED? Grains White bread. White pasta. White rice. Refined cornbread. Bagels and croissants. Crackers that contain trans fat. Vegetables Creamed or fried vegetables. Vegetables in a cheese sauce. Regular canned vegetables. Regular canned tomato sauce and paste. Regular tomato and vegetable juices. Fruits Dried fruits. Canned fruit in light or heavy syrup. Fruit juice. Meat and Other Protein Products Fatty cuts of meat. Ribs, chicken wings, bacon, sausage, bologna, salami, chitterlings, fatback, hot dogs, bratwurst, and packaged luncheon meats. Salted nuts and seeds. Canned beans with salt. Dairy Whole or 2% milk, cream, half-and-half, and cream cheese. Whole-fat or sweetened yogurt. Full-fat cheeses or blue cheese. Nondairy creamers and whipped toppings. Processed cheese, cheese spreads, or cheese curds. Condiments Onion and garlic salt, seasoned salt, table salt, and sea salt. Canned and packaged gravies. Worcestershire sauce. Tartar sauce. Barbecue sauce. Teriyaki sauce. Soy sauce, including reduced sodium. Steak sauce. Fish sauce. Oyster sauce. Cocktail sauce. Horseradish. Ketchup and mustard. Meat flavorings and tenderizers. Bouillon cubes. Hot sauce. Tabasco sauce. Marinades. Taco seasonings. Relishes. Fats and Oils Butter, stick margarine, lard, shortening, ghee, and bacon fat. Coconut, palm kernel, or palm oils. Regular salad dressings. Other Pickles and olives. Salted popcorn and pretzels. The items listed above may not be a complete list of foods and beverages to avoid. Contact your dietitian for more information. WHERE CAN I FIND MORE INFORMATION? National Heart, Lung, and Blood Institute: travelstabloid.com   This information is not intended to replace  advice given to you by your health care provider. Make sure you discuss any questions you have with your health care provider.   Document Released: 01/04/2011 Document Revised: 02/05/2014 Document Reviewed: 11/19/2012 Elsevier Interactive Patient Education [employer].

## 2015-09-26 NOTE — Assessment & Plan Note (Signed)
Check cmp and cbc.  

## 2015-09-26 NOTE — Assessment & Plan Note (Signed)
Doing well; follow-up with cardiologist

## 2015-09-26 NOTE — Assessment & Plan Note (Signed)
Well-controlled, DASH guidelines

## 2015-09-26 NOTE — Progress Notes (Signed)
BP 124/80   Pulse 62   Temp 98.4 F (36.9 C) (Oral)   Resp 16   Wt 185 lb 3.2 oz (84 kg)   SpO2 98%   BMI 28.16 kg/m    Subjective:    Patient ID: Timothy Riley, male    DOB: 06-29-1940, 75 y.o.   MRN: [id number]  HPI: [name] is a 75 y.o. male  Chief Complaint  Patient presents with  . Follow-up    Type 2 diabetes; on medicine; diagnosed about 10-12 years ago; eats anything he wants; he is active; weight is stable; no numbness or sores of the feet; sees eye doctor every February; he checks his sugars but only about once a month Last A1c 7.4 in November 2016  HTN; on medicine; many years; runs in the family; doesn't add salt; no decongestants; avoids black licorice  In [id number], he had a slight heart murmur; saw cardiologist, had echocardiogram; has new aortic valve; two brothers had the same thing done years ago; had some attacks after the valve was replaced, he needed an ablation 6 months later; nothing since  Stage 3 CKD; avoiding NSAIDs; stays well-hydrated  Had a slow drip with urinating; seeing Dr. Rogers Blocker, urologist  Depression screen [facility] 2/9 09/26/2015 03/29/2015 08/27/2014  Decreased Interest 0 0 0  Down, Depressed, Hopeless 0 0 0  PHQ - 2 Score 0 0 0    Relevant past medical, surgical, family and social history reviewed Past Medical History:  Diagnosis Date  . Diabetes mellitus without complication ([place])   . Hypertension   . Urinary incontinence    Past Surgical History:  Procedure Laterality Date  . AORTIC VALVE REPLACEMENT    . CARDIAC CATHETERIZATION    . heart valve replacment     Family History  Problem Relation Age of Onset  . Cancer Mother   . Stroke Father    Social History  Substance Use Topics  . Smoking status: Former Research scientist (life sciences)  . Smokeless tobacco: Not on file     Comment: over 40 yrs ago  . Alcohol use No    Interim medical history since last visit reviewed. Allergies and medications reviewed  Review of Systems Per HPI  unless specifically indicated above     Objective:    BP 124/80   Pulse 62   Temp 98.4 F (36.9 C) (Oral)   Resp 16   Wt 185 lb 3.2 oz (84 kg)   SpO2 98%   BMI 28.16 kg/m   Wt Readings from Last 3 Encounters:  09/26/15 185 lb 3.2 oz (84 kg)  03/29/15 181 lb 12.8 oz (82.5 kg)  03/11/15 179 lb 4 oz (81.3 kg)    Physical Exam  Constitutional: He appears well-developed and well-nourished. No distress.  HENT:  Head: Normocephalic and atraumatic.  Eyes: EOM are normal. No scleral icterus.  Neck: No thyromegaly present.  Cardiovascular: Normal rate and regular rhythm.   No murmur heard. Pulmonary/Chest: Effort normal and breath sounds normal.  Abdominal: Soft. Bowel sounds are normal. He exhibits no distension.  Musculoskeletal: He exhibits no edema.  Neurological: Coordination normal.  Skin: Skin is warm and dry. No pallor.  Psychiatric: He has a normal mood and affect. His behavior is normal. Judgment and thought content normal.   Diabetic Foot Form - Detailed   Diabetic Foot Exam - detailed Diabetic Foot exam was performed with the following findings:  Yes 09/26/2015  1:45 PM  Visual Foot Exam completed.:  Yes  Are  the toenails ingrown?:  No Normal Range of Motion:  Yes Pulse Foot Exam completed.:  Yes  Right Dorsalis Pedis:  Present Left Dorsalis Pedis:  Present  Sensory Foot Exam Completed.:  Yes Swelling:  No Semmes-Weinstein Monofilament Test R Site 1-Great Toe:  Pos L Site 1-Great Toe:  Pos  R Site 4:  Pos L Site 4:  Pos  R Site 5:  Pos L Site 5:  Pos       Results for orders placed or performed in visit on 12/28/14  POCT glycosylated hemoglobin (Hb A1C)  Result Value Ref Range   Hemoglobin A1C 7.4   POCT UA - Microalbumin  Result Value Ref Range   Microalbumin Ur, POC 100 mg/L   Creatinine, POC  mg/dL   Albumin/Creatinine Ratio, Urine, POC        Assessment & Plan:   Problem List Items Addressed This Visit      Cardiovascular and Mediastinum    Hypertension goal BP (blood pressure) < 140/90    Well-controlled, DASH guidelines        Endocrine   Diabetes mellitus type 2, controlled, without complications (HCC)    Foot exams every night; foot exam by MD today; keep an eye on left great toe; eye exams yearly; return in 1-2 weeks for A1c and urine      Relevant Orders   Hemoglobin A1c   Microalbumin / creatinine urine ratio     Genitourinary   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    Managed by urologist        Other   Status post ablation of accessory bypass tract    Doing well; follow-up with cardiologist      Encounter for medication monitoring    Check cmp and cbc      Relevant Orders   CBC with Differential/Platelet   COMPLETE METABOLIC PANEL WITH GFR   Combined fat and carbohydrate induced hyperlipemia    Patient does not want to take a statin; limit saturated fats; check fasting lipids in the next week or two      Relevant Orders   Lipid panel    Other Visit Diagnoses   None.     Follow up plan: Return in about 6 months (around 03/28/2016) for diabetes.  An after-visit summary was printed and given to the patient at [facility].  Please see the patient instructions which may contain other information and recommendations beyond what is mentioned above in the assessment and plan.  No orders of the defined types were placed in this encounter.   Orders Placed This Encounter  Procedures  . CBC with Differential/Platelet  . Hemoglobin A1c  . Microalbumin / creatinine urine ratio  . COMPLETE METABOLIC PANEL WITH GFR  . Lipid panel

## 2015-09-26 NOTE — Assessment & Plan Note (Signed)
Foot exams every night; foot exam by MD today; keep an eye on left great toe; eye exams yearly; return in 1-2 weeks for A1c and urine

## 2015-09-26 NOTE — Assessment & Plan Note (Signed)
Managed by urologist 

## 2015-10-14 ENCOUNTER — Encounter: Payer: Self-pay | Admitting: Cardiovascular Disease

## 2015-10-14 ENCOUNTER — Ambulatory Visit (INDEPENDENT_AMBULATORY_CARE_PROVIDER_SITE_OTHER): Payer: Medicare Other | Admitting: Cardiovascular Disease

## 2015-10-14 VITALS — BP 158/80 | HR 57 | Ht 68.0 in | Wt 183.1 lb

## 2015-10-14 DIAGNOSIS — I10 Essential (primary) hypertension: Secondary | ICD-10-CM

## 2015-10-14 DIAGNOSIS — Z952 Presence of prosthetic heart valve: Secondary | ICD-10-CM

## 2015-10-14 DIAGNOSIS — I129 Hypertensive chronic kidney disease with stage 1 through stage 4 chronic kidney disease, or unspecified chronic kidney disease: Secondary | ICD-10-CM

## 2015-10-14 DIAGNOSIS — I2510 Atherosclerotic heart disease of native coronary artery without angina pectoris: Secondary | ICD-10-CM | POA: Diagnosis not present

## 2015-10-14 DIAGNOSIS — Z954 Presence of other heart-valve replacement: Secondary | ICD-10-CM | POA: Diagnosis not present

## 2015-10-14 DIAGNOSIS — N182 Chronic kidney disease, stage 2 (mild): Secondary | ICD-10-CM

## 2015-10-14 NOTE — Patient Instructions (Signed)
Medication Instructions:   No medication changes made  Labwork:  No new labs needed  Testing/Procedures:  No further testing at this time  Please monitor your pressures   Follow-Up: It was a pleasure seeing you in the office today. Please call us if you have new issues that need to be addressed before your next appt.  [phone]  Your physician wants you to follow-up in: 12 months.  You will receive a reminder letter in the mail two months in advance. If you don't receive a letter, please call our office to schedule the follow-up appointment.  If you need a refill on your cardiac medications before your next appointment, please call your pharmacy.

## 2015-10-14 NOTE — Progress Notes (Signed)
Cardiology Office Note  Date:  10/14/2015   ID:  Timothy Riley, DOB 11-28-1940, MRN [id number]  PCP:  Enid Derry, MD   Chief Complaint  Patient presents with  . Follow-up    no cp, sob or swelling. noother complaints.    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 at [facility] with bovine valve, history of SVT episodes since his AVR, requiring ablation 06/30/2014 again at [facility], also with diabetes type 2 with most recent hemoglobin A1c 7.8, BPH, normal ejection fraction who presents for routine follow-up of his bioprosthetic valve, hypertension  In follow-up, he reports that he is doing well Initial blood pressure elevated on arrival, reports he has been rushing Systolic Blood pressure 527P on cardura 4 mg daily Monitoring blood pressure at home Doing well, active with no complaints No recent lab work available HBA1C 7.8  06/2014  Previous echocardiogram done at [facility] April 2016, not available in our system  EKG on today's visit shows normal sinus rhythm with rate 57 bpm, right bundle branch block  Other past medical history He was hypotensive on a previous clinic visit, stopped his tamsulosin, chlorthalidone Reports having tried a statin in the past, felt jittery  Previous notes from outside cardiologist says he has mild coronary artery disease, details not available through care everywhere Followed by endocrine for his diabetes, hemoglobin A1c of >7  Prior episodes of SVT requiring adenosine He denies any significant CAD by prior catheterization.    PMH:   has a past medical history of Diabetes mellitus without complication ([place]); Hypertension; and Urinary incontinence.  PSH:    Past Surgical History:  Procedure Laterality Date  . AORTIC VALVE REPLACEMENT    . CARDIAC CATHETERIZATION    . heart valve replacment      Current Outpatient Prescriptions  Medication Sig  Dispense Refill  . amLODipine (NORVASC) 10 MG tablet Take 1 tablet (10 mg total) by mouth daily. 90 tablet 3  . aspirin 81 MG tablet Take 81 mg by mouth daily. At bed time.    Marland Kitchen doxazosin (CARDURA) 8 MG tablet Take 1 tablet (8 mg total) by mouth daily. (Patient taking differently: 4 mg daily. ) 30 tablet 6  . glipiZIDE (GLUCOTROL) 10 MG tablet Take 1 tablet (10 mg total) by mouth 2 (two) times daily before a meal. 180 tablet 3  . lisinopril (PRINIVIL,ZESTRIL) 40 MG tablet Take 1 tablet (40 mg total) by mouth daily. 90 tablet 3  . metoprolol succinate (TOPROL-XL) 50 MG 24 hr tablet Take 1 tablet (50 mg total) by mouth daily. 90 tablet 3  . sitaGLIPtin (JANUVIA) 50 MG tablet Take 1 tablet (50 mg total) by mouth daily. 90 tablet 3   No current facility-administered medications for this visit.      Allergies:   Hydralazine hcl   Social History:  The patient  reports that he has quit smoking. He does not have any smokeless tobacco history on file. He reports that he does not drink alcohol or use drugs.   Family History:   family history includes Cancer in his mother; Stroke in his father.    Review of Systems: Review of Systems  Constitutional: Negative.   Respiratory: Negative.   Cardiovascular: Negative.   Gastrointestinal: Negative.   Musculoskeletal: Negative.   Neurological: Negative.   Psychiatric/Behavioral: Negative.   All other systems reviewed and are negative.    PHYSICAL EXAM: VS:  BP (!) 158/80 (BP Location: Left Arm, Patient Position: Sitting, Cuff Size: Normal)   Pulse (!) 57   Ht 5\' 8"  (1.727 m)   Wt 183 lb 1.9 oz (83.1 kg)   BMI 27.84 kg/m  , BMI Body mass index is 27.84 kg/m. GEN: Well nourished, well developed, in no acute distress  HEENT: normal  Neck: no JVD, carotid bruits, or masses Cardiac: RRR; no murmurs, rubs, or gallops,no edema  Respiratory:  clear to auscultation bilaterally, normal work of breathing GI: soft, nontender, nondistended, + BS MS:  no deformity or atrophy  Skin: warm and dry, no rash Neuro:  Strength and sensation are intact Psych: euthymic mood, full affect    Recent Labs: No results found for requested labs within last 8760 hours.    Lipid Panel No results found for: CHOL, HDL, LDLCALC, TRIG    Wt Readings from Last 3 Encounters:  10/14/15 183 lb 1.9 oz (83.1 kg)  09/26/15 185 lb 3.2 oz (84 kg)  03/29/15 181 lb 12.8 oz (82.5 kg)       ASSESSMENT AND PLAN:  Hypertension goal BP (blood pressure) < 140/90 - Plan: EKG 12-Lead Blood pressure elevated on arrival He reports well-controlled blood pressure at home on his current medication regimen Recommended he continue to monitor numbers at home and call our office if these run high Medications refilled  Chronic renal insufficiency No recent lab work available, previous creatinine 1.3 Encouraged fluids Repeat lab work pending  Arteriosclerosis of coronary artery - Plan: EKG 12-Lead Previous cardiac catheterization not available We have requested this for our system  S/P AVR (aortic valve replacement) - Plan: EKG 12-Lead We will try to obtain his previous echocardiogram April 2016 He does not want repeat studies at this time as he feels well   Total encounter time more than 25 minutes  Greater than 50% was spent in counseling and coordination of care with the patient   Disposition:   F/U  12 months   Orders Placed This Encounter  Procedures  . EKG 12-Lead     Signed, Esmond Plants, M.D., Ph.D. 10/14/2015  [place], [place]

## 2015-10-20 ENCOUNTER — Telehealth: Payer: Self-pay | Admitting: Family Medicine

## 2015-10-20 NOTE — Telephone Encounter (Signed)
Called Pt wife & left message.

## 2015-10-20 NOTE — Telephone Encounter (Signed)
Please ask pt to have his labs done and we can see what medicine he needs Okay to STOP Januvia right now if he cannot afford it We do not have samples

## 2015-10-21 ENCOUNTER — Other Ambulatory Visit: Payer: Self-pay | Admitting: Family Medicine

## 2015-10-21 DIAGNOSIS — E782 Mixed hyperlipidemia: Secondary | ICD-10-CM | POA: Diagnosis not present

## 2015-10-21 DIAGNOSIS — Z5181 Encounter for therapeutic drug level monitoring: Secondary | ICD-10-CM | POA: Diagnosis not present

## 2015-10-21 DIAGNOSIS — E119 Type 2 diabetes mellitus without complications: Secondary | ICD-10-CM | POA: Diagnosis not present

## 2015-10-21 LAB — CBC WITH DIFFERENTIAL/PLATELET
BASOS PCT: 1 %
Basophils Absolute: 74 cells/uL (ref 0–200)
EOS PCT: 3 %
Eosinophils Absolute: 222 cells/uL (ref 15–500)
HEMATOCRIT: 41.3 % (ref 38.5–50.0)
Hemoglobin: 14 g/dL (ref 13.2–17.1)
LYMPHS PCT: 32 %
Lymphs Abs: 2368 cells/uL (ref 850–3900)
MCH: 29.8 pg (ref 27.0–33.0)
MCHC: 33.9 g/dL (ref 32.0–36.0)
MCV: 87.9 fL (ref 80.0–100.0)
MONO ABS: 592 {cells}/uL (ref 200–950)
MONOS PCT: 8 %
MPV: 10.2 fL (ref 7.5–12.5)
NEUTROS PCT: 56 %
Neutro Abs: 4144 cells/uL (ref 1500–7800)
PLATELETS: 190 10*3/uL (ref 140–400)
RBC: 4.7 MIL/uL (ref 4.20–5.80)
RDW: 13.7 % (ref 11.0–15.0)
WBC: 7.4 10*3/uL (ref 3.8–10.8)

## 2015-10-21 LAB — LIPID PANEL
CHOLESTEROL: 190 mg/dL (ref 125–200)
HDL: 28 mg/dL — AB (ref >=40)
LDL CALC: 96 mg/dL (ref <130)
TRIGLYCERIDES: 329 mg/dL — AB (ref <150)
Total CHOL/HDL Ratio: 6.8 Ratio — ABNORMAL HIGH (ref <=5.0)
VLDL: 66 mg/dL — ABNORMAL HIGH (ref <30)

## 2015-10-21 LAB — COMPLETE METABOLIC PANEL WITH GFR
ALK PHOS: 70 U/L (ref 40–115)
ALT: 38 U/L (ref 9–46)
AST: 49 U/L — AB (ref 10–35)
Albumin: 4.5 g/dL (ref 3.6–5.1)
BUN: 28 mg/dL — AB (ref 7–25)
CALCIUM: 9.3 mg/dL (ref 8.6–10.3)
CHLORIDE: 104 mmol/L (ref 98–110)
CO2: 24 mmol/L (ref 20–31)
Creat: 1.45 mg/dL — ABNORMAL HIGH (ref 0.70–1.18)
GFR, EST NON AFRICAN AMERICAN: 46 mL/min — AB (ref >=60)
GFR, Est African American: 54 mL/min — ABNORMAL LOW (ref >=60)
Glucose, Bld: 208 mg/dL — ABNORMAL HIGH (ref 65–99)
POTASSIUM: 4 mmol/L (ref 3.5–5.3)
SODIUM: 141 mmol/L (ref 135–146)
Total Bilirubin: 0.9 mg/dL (ref 0.2–1.2)
Total Protein: 6.9 g/dL (ref 6.1–8.1)

## 2015-10-22 LAB — MICROALBUMIN / CREATININE URINE RATIO
CREATININE, URINE: 254 mg/dL (ref 20–370)
MICROALB/CREAT RATIO: 18 ug/mg{creat} (ref <30)
Microalb, Ur: 4.6 mg/dL

## 2015-10-22 LAB — HEMOGLOBIN A1C
Hgb A1c MFr Bld: 8.5 % — ABNORMAL HIGH (ref <5.7)
Mean Plasma Glucose: 197 mg/dL

## 2015-10-24 ENCOUNTER — Telehealth: Payer: Self-pay

## 2015-10-24 NOTE — Telephone Encounter (Signed)
Please review labs and decide what we are going to do about diabetic meds due to his donut hole?

## 2015-10-25 ENCOUNTER — Encounter: Payer: Self-pay | Admitting: Family Medicine

## 2015-10-25 ENCOUNTER — Telehealth: Payer: Self-pay | Admitting: Family Medicine

## 2015-10-25 DIAGNOSIS — E119 Type 2 diabetes mellitus without complications: Secondary | ICD-10-CM

## 2015-10-25 MED ORDER — GLIPIZIDE 10 MG PO TABS: 10.0000 mg | ORAL_TABLET | Freq: Two times a day (BID) | ORAL | 0 refills | Status: DC

## 2015-10-25 NOTE — Telephone Encounter (Signed)
I reached pt The last medicine, januvia, too expensive Creatinine 1.45; stage 3 CKD, with GFR 46 He does not want insulin He had been on metformin, but it was stopped Strict diet discussed Refer to nephrology; avoid NSAIDs I'll send msg to cardiologist to see if Actos okay Patient agrees -------------------------------- Dr. Rockey Situ, [place] [place]. I'm working with patient to get A1c down. Would he be an appropriate candidate for Actos (pioglitazone)? Thank you

## 2015-10-25 NOTE — Telephone Encounter (Signed)
I spoke with pt - see other phone note

## 2015-10-25 NOTE — Telephone Encounter (Signed)
I called; he does not have voicemail set up

## 2015-10-25 NOTE — Telephone Encounter (Signed)
Patient keeps calling and is concerned he is totally out of medications wants to know what you are going to do?

## 2015-10-26 ENCOUNTER — Other Ambulatory Visit: Payer: Self-pay | Admitting: Family Medicine

## 2015-10-26 DIAGNOSIS — R748 Abnormal levels of other serum enzymes: Secondary | ICD-10-CM

## 2015-10-26 DIAGNOSIS — N183 Chronic kidney disease, stage 3 unspecified: Secondary | ICD-10-CM

## 2015-10-26 DIAGNOSIS — E781 Pure hyperglyceridemia: Secondary | ICD-10-CM

## 2015-10-26 DIAGNOSIS — Z5181 Encounter for therapeutic drug level monitoring: Secondary | ICD-10-CM

## 2015-10-26 HISTORY — DX: Abnormal levels of other serum enzymes: R74.8

## 2015-10-26 MED ORDER — PITAVASTATIN CALCIUM 2 MG PO TABS: 1.0000 | ORAL_TABLET | Freq: Every day | ORAL | 1 refills | Status: DC

## 2015-10-26 NOTE — Telephone Encounter (Signed)
I think it should be okay to try the Actos If he develops fluid retention, we can stop thx TG

## 2015-10-26 NOTE — Progress Notes (Signed)
Add livalo and fish or krill oil recheck lipids and sgpt in 6 weeks Recheck A1c in 3 months

## 2015-10-26 NOTE — Assessment & Plan Note (Signed)
Add Livalo; recheck in 6 weeks

## 2015-10-26 NOTE — Assessment & Plan Note (Signed)
Check sgpt in 6 weeks 

## 2015-10-26 NOTE — Assessment & Plan Note (Signed)
Add fish oil or krill oil, plus Livalo

## 2015-10-26 NOTE — Assessment & Plan Note (Signed)
Refer to nephrologist 

## 2015-10-27 ENCOUNTER — Telehealth: Payer: Self-pay

## 2015-10-27 MED ORDER — PIOGLITAZONE HCL 15 MG PO TABS: 15.0000 mg | ORAL_TABLET | Freq: Every day | ORAL | 2 refills | Status: DC

## 2015-10-27 NOTE — Telephone Encounter (Signed)
Thanks to my cardiology colleague ------------------------ [facility] staff: Please call patient; let him know I've sent in a new Rx to add for diabetes; a possible side effect is fluid retention, so if he notices swelling in legs, shortness of breath, etc., then stop it and call He should continue taking the glipizide twice a day Thank you

## 2015-10-27 NOTE — Telephone Encounter (Signed)
Pt was notified about his add on diabetes medication and side effects. Pt was informed.   Timothy Riley, [place]

## 2015-10-27 NOTE — Telephone Encounter (Signed)
Pt.notified

## 2015-11-02 ENCOUNTER — Telehealth: Payer: Self-pay | Admitting: Family Medicine

## 2015-11-02 NOTE — Telephone Encounter (Signed)
Patient called the pharmacy and was told that the cholesterol medication is needing an authorization. Was told that we need to contact his insurance company.

## 2015-11-03 ENCOUNTER — Telehealth: Payer: Self-pay

## 2015-11-03 MED ORDER — ROSUVASTATIN CALCIUM 5 MG PO TABS: 5.0000 mg | ORAL_TABLET | Freq: Every day | ORAL | 1 refills | Status: DC

## 2015-11-03 NOTE — Telephone Encounter (Signed)
Tell patient to NOT use the Livalo then I sent in a new Rx for low dose Crestor Recheck fasting cholesterol and sgpt 6 weeks after starting the new medicine

## 2015-11-03 NOTE — Telephone Encounter (Signed)
Rhea called stating that the RX for Livalo has been denied but that he could take any of the Statins (tier 6, which will be no cost to him) or Crestor (some cost, but they are unsure of how much).  If any of the tier 6 meds are chosen, a rx just needs to be sent in via escribe.  Please advise.  Thanks

## 2015-11-03 NOTE — Telephone Encounter (Signed)
Dr. Sanda Klein,  This was regarding the Livalo, so I will disregard the prior auth request.  Thanks

## 2015-11-03 NOTE — Telephone Encounter (Signed)
Patient notified

## 2015-11-03 NOTE — Telephone Encounter (Signed)
Please contact insurance company if this is for Crestor; he apparently reacted to an older statin (we don't know which one, perhaps cardiologist does) If this is for Livalo, disregard because we are not going to use that

## 2015-11-29 ENCOUNTER — Other Ambulatory Visit: Payer: Self-pay | Admitting: Family Medicine

## 2015-11-30 NOTE — Telephone Encounter (Signed)
Dr. Sanda Klein Pt wife is in bad shape. He states that she fell 16 times in the last 6 months therefore he would not be able  to get up here to get labs done for at least 1 month and half. I suggest to pt once he has a free day or any free time I will put the papers up front. He also asked if he can get at least 30 day supply so he want be out of his medication.

## 2015-11-30 NOTE — Telephone Encounter (Signed)
Please ask pt to have fasting labs done this week; he should not be out of medicine before we have checked his labs; he can come this afternoon (skip lunch) or tomorrow morning

## 2015-12-01 MED ORDER — ROSUVASTATIN CALCIUM 5 MG PO TABS: 5.0000 mg | ORAL_TABLET | Freq: Every day | ORAL | 0 refills | Status: DC

## 2015-12-01 NOTE — Telephone Encounter (Signed)
We're sorry to hear about his wife's illness; I understand it must be difficult with what he's going through right now; if he gets a chance to slip away, we'd really like to make sure his liver is okay and his cholesterol is at goal If an afternoon would suit him better; he can have his usual breakfast, then skip lunch and come in around 3 or 3:30 some afternoon after a 6 hour fast; thank you (I did send 30 more days of Rx)

## 2015-12-02 NOTE — Telephone Encounter (Signed)
Explain to Dr. lada the pt situation. Dr. lada approve his 30 day supply for Crestor. Called pt to notify him.

## 2015-12-02 NOTE — Telephone Encounter (Signed)
Pt.notified

## 2015-12-08 ENCOUNTER — Other Ambulatory Visit: Payer: Self-pay

## 2015-12-08 DIAGNOSIS — I10 Essential (primary) hypertension: Secondary | ICD-10-CM

## 2015-12-08 MED ORDER — AMLODIPINE BESYLATE 10 MG PO TABS: 10.0000 mg | ORAL_TABLET | Freq: Every day | ORAL | 3 refills | Status: DC

## 2015-12-08 NOTE — Telephone Encounter (Signed)
Pt needs 90 day supply per INS.

## 2015-12-08 NOTE — Telephone Encounter (Signed)
rx approved

## 2015-12-18 DIAGNOSIS — N50811 Right testicular pain: Secondary | ICD-10-CM | POA: Diagnosis not present

## 2015-12-19 ENCOUNTER — Ambulatory Visit
Admission: RE | Admit: 2015-12-19 | Discharge: 2015-12-19 | Disposition: A | Discharge status: Home / Self Care | Payer: Medicare Other | Source: Ambulatory Visit | Attending: Family Medicine | Admitting: Family Medicine

## 2015-12-19 ENCOUNTER — Other Ambulatory Visit: Payer: Self-pay | Admitting: Family Medicine

## 2015-12-19 DIAGNOSIS — N50811 Right testicular pain: Secondary | ICD-10-CM | POA: Insufficient documentation

## 2015-12-19 DIAGNOSIS — N50819 Testicular pain, unspecified: Secondary | ICD-10-CM | POA: Diagnosis not present

## 2015-12-19 DIAGNOSIS — N433 Hydrocele, unspecified: Secondary | ICD-10-CM | POA: Insufficient documentation

## 2016-01-02 ENCOUNTER — Other Ambulatory Visit: Payer: Self-pay | Admitting: Family Medicine

## 2016-01-02 NOTE — Telephone Encounter (Signed)
Please see 11/29/15 note Please call patient; we really would like him to get his labs done soon please; if mornings aren't convenient, he can have breakfast, skip lunch, and come in for labs in the mid to late afternoon I'll send just a few but we really would like to monitor this drug (the Crestor) and make sure it's the right dose and not causing harm to the liver; thank you

## 2016-01-03 NOTE — Telephone Encounter (Signed)
Called patient regarding his Med refill. Left a detail messaged about his labs and why he is only approve for 10 tablets.

## 2016-01-27 ENCOUNTER — Other Ambulatory Visit: Payer: Self-pay | Admitting: Family Medicine

## 2016-01-31 MED ORDER — ROSUVASTATIN CALCIUM 5 MG PO TABS: 5.0000 mg | ORAL_TABLET | Freq: Every day | ORAL | 0 refills | Status: DC

## 2016-01-31 NOTE — Telephone Encounter (Signed)
Glitch in computer system; I was not getting refill requests; addressing today --------------------------- Approved meds, but he's due for fasting labs; note on Rx

## 2016-02-06 ENCOUNTER — Other Ambulatory Visit: Payer: Self-pay | Admitting: Family Medicine

## 2016-02-06 NOTE — Telephone Encounter (Signed)
I just approved Crestor, 30 pills with no refills to [facility] on 01/31/16; receipt was confirmed by pharmacy He needs labs too Please resolve with pharmacy and ask patient to come in for fasting labs Thank you

## 2016-02-07 NOTE — Telephone Encounter (Signed)
Left voicemail about needing labs and called pharmacy about rx

## 2016-02-17 ENCOUNTER — Other Ambulatory Visit: Payer: Self-pay

## 2016-02-17 DIAGNOSIS — I10 Essential (primary) hypertension: Secondary | ICD-10-CM

## 2016-02-17 MED ORDER — LISINOPRIL 40 MG PO TABS: 40.0000 mg | ORAL_TABLET | Freq: Every day | ORAL | 0 refills | Status: DC

## 2016-02-17 NOTE — Telephone Encounter (Signed)
Last K+ and Cr reviewed; Rx approved for 90 days Will need labs prior to next refill

## 2016-02-21 DIAGNOSIS — M792 Neuralgia and neuritis, unspecified: Secondary | ICD-10-CM | POA: Diagnosis not present

## 2016-02-21 DIAGNOSIS — R51 Headache: Secondary | ICD-10-CM | POA: Diagnosis not present

## 2016-02-21 DIAGNOSIS — G500 Trigeminal neuralgia: Secondary | ICD-10-CM | POA: Diagnosis not present

## 2016-02-23 ENCOUNTER — Other Ambulatory Visit: Payer: Self-pay | Admitting: Family Medicine

## 2016-02-23 DIAGNOSIS — E119 Type 2 diabetes mellitus without complications: Secondary | ICD-10-CM

## 2016-02-27 NOTE — Telephone Encounter (Signed)
rx approved

## 2016-03-02 ENCOUNTER — Other Ambulatory Visit: Payer: Self-pay

## 2016-03-02 DIAGNOSIS — I10 Essential (primary) hypertension: Secondary | ICD-10-CM

## 2016-03-02 MED ORDER — METOPROLOL SUCCINATE ER 50 MG PO TB24: 50.0000 mg | ORAL_TABLET | Freq: Every day | ORAL | 3 refills | Status: DC

## 2016-03-07 ENCOUNTER — Other Ambulatory Visit: Payer: Self-pay | Admitting: Family Medicine

## 2016-03-07 NOTE — Telephone Encounter (Signed)
Please see 02/06/16 phone note; we'd really like patient to have those labs done please Thank you I'll approve a week of the 5 mg strength

## 2016-03-08 ENCOUNTER — Other Ambulatory Visit: Payer: Self-pay

## 2016-03-08 DIAGNOSIS — R748 Abnormal levels of other serum enzymes: Secondary | ICD-10-CM

## 2016-03-08 DIAGNOSIS — Z5181 Encounter for therapeutic drug level monitoring: Secondary | ICD-10-CM

## 2016-03-08 DIAGNOSIS — E781 Pure hyperglyceridemia: Secondary | ICD-10-CM

## 2016-03-08 NOTE — Telephone Encounter (Signed)
Patient notified

## 2016-03-15 DIAGNOSIS — R748 Abnormal levels of other serum enzymes: Secondary | ICD-10-CM | POA: Diagnosis not present

## 2016-03-15 DIAGNOSIS — E781 Pure hyperglyceridemia: Secondary | ICD-10-CM | POA: Diagnosis not present

## 2016-03-15 DIAGNOSIS — Z5181 Encounter for therapeutic drug level monitoring: Secondary | ICD-10-CM | POA: Diagnosis not present

## 2016-03-15 LAB — LIPID PANEL
CHOL/HDL RATIO: 4.8 ratio (ref <5.0)
Cholesterol: 138 mg/dL (ref <200)
HDL: 29 mg/dL — AB (ref >40)
LDL Cholesterol: 73 mg/dL (ref <100)
TRIGLYCERIDES: 181 mg/dL — AB (ref <150)
VLDL: 36 mg/dL — ABNORMAL HIGH (ref <30)

## 2016-03-15 LAB — ALT: ALT: 21 U/L (ref 9–46)

## 2016-03-18 ENCOUNTER — Other Ambulatory Visit: Payer: Self-pay | Admitting: Family Medicine

## 2016-03-18 MED ORDER — ROSUVASTATIN CALCIUM 5 MG PO TABS: 5.0000 mg | ORAL_TABLET | Freq: Every day | ORAL | 5 refills | Status: DC

## 2016-03-18 NOTE — Progress Notes (Signed)
Rx for Crestor sent

## 2016-03-23 ENCOUNTER — Encounter: Payer: Self-pay | Admitting: Nurse Practitioner

## 2016-03-23 ENCOUNTER — Ambulatory Visit (INDEPENDENT_AMBULATORY_CARE_PROVIDER_SITE_OTHER): Payer: Medicare Other | Admitting: Nurse Practitioner

## 2016-03-23 ENCOUNTER — Telehealth: Payer: Self-pay | Admitting: Cardiovascular Disease

## 2016-03-23 VITALS — BP 162/72 | HR 53 | Ht 67.0 in | Wt 183.5 lb

## 2016-03-23 DIAGNOSIS — I10 Essential (primary) hypertension: Secondary | ICD-10-CM | POA: Diagnosis not present

## 2016-03-23 DIAGNOSIS — Z952 Presence of prosthetic heart valve: Secondary | ICD-10-CM

## 2016-03-23 DIAGNOSIS — R0789 Other chest pain: Secondary | ICD-10-CM | POA: Diagnosis not present

## 2016-03-23 NOTE — Progress Notes (Signed)
Office Visit    Patient Name: Timothy Riley Date of Encounter: 03/23/2016  Primary Care Provider:  Enid Derry, MD Primary Cardiologist:  Johnny Bridge, MD   Chief Complaint    76 year old male with a history of aortic stenosis status post aortic valve replacement with pericardial tissue valve at [facility] in 2016, hypertension, supraventricular tachycardia status post catheter ablation, diabetes, and stage III chronic disease, who presents for evaluation of fleeting chest pain.  Past Medical History    Past Medical History:  Diagnosis Date  . Aortic stenosis    a. 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 Glower MD @ [facility] via R thoracic approach.  . CKD (chronic kidney disease) stage 3, GFR 30-59 ml/min 06/17/2014  . Diabetes mellitus without complication ([place])   . Hypertension   . Non-obstructive CAD (coronary artery disease)    a. 12/2013 Cath ([facility]): LAD 30p, RI 60, otw nl (per CT surgery note from [facility]).  Marland Kitchen PSVT (paroxysmal supraventricular tachycardia) ([place])    a. 05/2014 s/p RFCA @ [facility].  Marland Kitchen Urinary incontinence    Past Surgical History:  Procedure Laterality Date  . AORTIC VALVE REPLACEMENT    . CARDIAC CATHETERIZATION    . heart valve replacment      Allergies  Allergies  Allergen Reactions  . Hydralazine Hcl Rash    History of Present Illness    76 year old male with a prior history of severe aortic stenosis status post minimally invasive aortic valve replacement with a 23 mm Carpentier-Edwards pericardial tissue valve at [facility] in January 2016. Postoperative course was complicated by paroxysmal supraventricular tachycardia and this was subsequently treated with radiofrequency catheter ablation in May 2016. Other history includes hypertension, hyperlipidemia, diabetes, nonobstructive CAD, and CKD III.  He was last seen in clinic in 09/2015 and since then has been having a lot of stress, having to move his wife into a SNF.  In the setting  of caring for his wife over the last year, he hasn't been exercising as much, but has remained active.  Over the past month and a half, he has had three occasions of brief, fleeting, right sided c/p, that 'almost feels like and electrical jolt.' This occurs randomly, is not associated with exertion, has no associated symptoms, last one or 2 seconds, and resolve spontaneously. He notes that he has been having to lift his wife's wheelchair into the back of his truck on a regular basis and wonders if maybe he strained something in doing so. He denies any palpitations, PND, orthopnea, dizziness, syncope, edema, or early satiety.  Home Medications    Prior to Admission medications   Medication Sig Start Date End Date Taking? Authorizing Provider  amLODipine (NORVASC) 10 MG tablet Take 1 tablet (10 mg total) by mouth daily. 12/08/15  Yes Arnetha Courser, MD  aspirin 81 MG tablet Take 81 mg by mouth daily. At bed time.   Yes Historical Provider, MD  doxazosin (CARDURA) 8 MG tablet Take 1 tablet (8 mg total) by mouth daily. Patient taking differently: 4 mg daily.  03/11/15  Yes Minna Merritts, MD  glipiZIDE (GLUCOTROL) 10 MG tablet TAKE 1 TABLET BY MOUTH TWICE (2) DAILY BEFORE A MEAL 02/27/16  Yes Arnetha Courser, MD  lisinopril (PRINIVIL,ZESTRIL) 40 MG tablet Take 1 tablet (40 mg total) by mouth daily. 02/17/16  Yes Arnetha Courser, MD  metoprolol succinate (TOPROL-XL) 50 MG 24 hr tablet Take 1 tablet (50 mg total) by  mouth daily. 03/02/16  Yes Arnetha Courser, MD  pioglitazone (ACTOS) 15 MG tablet TAKE 1 TABLET BY MOUTH ONCE A DAY 01/31/16  Yes Arnetha Courser, MD  rosuvastatin (CRESTOR) 5 MG tablet Take 1 tablet (5 mg total) by mouth at bedtime. 03/18/16  Yes Arnetha Courser, MD    Review of Systems    As above, he has experienced 3 episodes of brief, fleeting, electrical jolt like right-sided chest pain. He denies PND, orthopnea, dyspnea, palpitations, dizziness, syncope, edema, or early satiety.  All other  systems reviewed and are otherwise negative except as noted above.  Physical Exam    VS:  BP (!) 162/72 (BP Location: Left Arm, Patient Position: Sitting, Cuff Size: Normal)   Pulse (!) 53   Ht 5\' 7"  (1.702 m)   Wt 183 lb 8 oz (83.2 kg)   BMI 28.74 kg/m  , BMI Body mass index is 28.74 kg/m. GEN: Well nourished, well developed, in no acute distress.  HEENT: normal.  Neck: Supple, no JVD, carotid bruits, or masses. Cardiac: RRR, 2/6 SEM RUSB, no rubs, or gallops. No clubbing, cyanosis, edema.  Radials/DP/PT 2+ and equal bilaterally.  Respiratory:  Respirations regular and unlabored, clear to auscultation bilaterally. GI: Soft, nontender, nondistended, BS + x 4. MS: no deformity or atrophy. Skin: warm and dry, no rash. Neuro:  Strength and sensation are intact. Psych: Normal affect.  Accessory Clinical Findings    ECG - Sinus bradycardia, 53, right bundle branch block, no acute changes.  Assessment & Plan    1.  Atypical Chest Pain:  Patient presents for evaluation related to 3 episodes of fleeting right-sided chest discomfort, which he says has felt like an electrical jolt moving through his chest. There are no associated symptoms. Discomfort lasts just a second or so prior to resolving spontaneously. He has had no exertional chest pain or dyspnea. ECG is stable with chronic right bundle branch block today. Chest pain is atypical and does not appear to be anginal in origin. I would not pursue stress testing at this time.  2. History of aortic stenosis status post aortic valve replacement: Has been nearly 2 years since his last echo. He does have a systolic murmur, which has been present since surgery plan to follow-up echo.  3. Essential hypertension: The pressures elevated in clinic today. He says that when he was checking it at home, it was typically in the 130s. He hasn't checked it in a few weeks related to being busy with getting his wife into a nursing facility. Rather than adjust  medications today, I recommended that he check his blood pressure daily over the next week and contact us if pressures are trending greater than 532 systolic. If so, we could increase his Cardura to 8 mg daily. Currently, he is taking just half a tablet.  4. Hyperlipidemia: LDL was 73 earlier this month. Continue statin therapy.  5. Type 2 diabetes mellitus: He is on Actos and glipizide, and followed by primary care.  6. Disposition: I will arrange for follow-up echo. He will contact us if blood pressure is trending greater than 130 mmHg. Otherwise, he wishes to follow-up in one year.   Murray Hodgkins, NP 03/23/2016, 2:08 PM

## 2016-03-23 NOTE — Telephone Encounter (Signed)
Spoke w/ pt.  He reports palpitations on his rt side for the past week "where they replaced that valve" for the past week. His wife was recently placed @ [facility], so he is unsure if there is a problem or if sx are anxiety related. Offered pt the choice of sending message to Dr. Rockey Situ, sched him appt at his next available 3/5 or seeing NP today. Pt states that he would prefer to be evaluated today.   Pt sched to see Ignacia Bayley, NP today @ 1:30.

## 2016-03-23 NOTE — Telephone Encounter (Signed)
Pt calling stating the past week he's been having some "surge of electricity" feeling on his right side. Like a thumping feeling.  He is not sure if it is anxiety since he just put his wife into a nursing home.  Denies SOB/CP/ Dizzy spells/ Please advise.

## 2016-03-23 NOTE — Patient Instructions (Addendum)
Medication Instructions:  Continue current medications.   Labwork: None today  Testing/Procedures: Your physician has requested that you have an echocardiogram. Echocardiography is a painless test that uses sound waves to create images of your heart. It provides your doctor with information about the size and shape of your heart and how well your heart's chambers and valves are working. This procedure takes approximately one hour. There are no restrictions for this procedure.    Follow-Up: Your physician wants you to follow-up in: 1 year with Dr. Rockey Situ. You will receive a reminder letter in the mail two months in advance. If you don't receive a letter, please call our office to schedule the follow-up appointment.  It was a pleasure seeing you today here in the office. Please do not hesitate to give [place] a call back if you have any further questions. Sem[place], BSN     Echocardiogram An echocardiogram, or echocardiography, uses sound waves (ultrasound) to produce an image of your heart. The echocardiogram is simple, painless, obtained within a short period of time, and offers valuable information to your health care provider. The images from an echocardiogram can provide information such as:  Evidence of coronary artery disease (CAD).  Heart size.  Heart muscle function.  Heart valve function.  Aneurysm detection.  Evidence of a past heart attack.  Fluid buildup around the heart.  Heart muscle thickening.  Assess heart valve function. Tell a health care provider about:  Any allergies you have.  All medicines you are taking, including vitamins, herbs, eye drops, creams, and over-the-counter medicines.  Any problems you or family members have had with anesthetic medicines.  Any blood disorders you have.  Any surgeries you have had.  Any medical conditions you have.  Whether you are pregnant or may be pregnant. What happens before the procedure? No  special preparation is needed. Eat and drink normally. What happens during the procedure?  In order to produce an image of your heart, gel will be applied to your chest and a wand-like tool (transducer) will be moved over your chest. The gel will help transmit the sound waves from the transducer. The sound waves will harmlessly bounce off your heart to allow the heart images to be captured in real-time motion. These images will then be recorded.  You may need an IV to receive a medicine that improves the quality of the pictures. What happens after the procedure? You may return to your normal schedule including diet, activities, and medicines, unless your health care provider tells you otherwise. This information is not intended to replace advice given to you by your health care provider. Make sure you discuss any questions you have with your health care provider. Document Released: 01/13/2000 Document Revised: 09/03/2015 Document Reviewed: 09/22/2012 Elsevier Interactive Patient Education  2017 [employer].

## 2016-03-29 ENCOUNTER — Encounter: Payer: Self-pay | Admitting: Family Medicine

## 2016-03-29 ENCOUNTER — Ambulatory Visit (INDEPENDENT_AMBULATORY_CARE_PROVIDER_SITE_OTHER): Payer: Medicare Other | Admitting: Family Medicine

## 2016-03-29 VITALS — BP 160/64 | HR 70 | Temp 98.4°F | Resp 16 | Wt 180.3 lb

## 2016-03-29 DIAGNOSIS — E1165 Type 2 diabetes mellitus with hyperglycemia: Secondary | ICD-10-CM | POA: Diagnosis not present

## 2016-03-29 DIAGNOSIS — N183 Chronic kidney disease, stage 3 unspecified: Secondary | ICD-10-CM

## 2016-03-29 DIAGNOSIS — I10 Essential (primary) hypertension: Secondary | ICD-10-CM

## 2016-03-29 DIAGNOSIS — I2510 Atherosclerotic heart disease of native coronary artery without angina pectoris: Secondary | ICD-10-CM

## 2016-03-29 LAB — BASIC METABOLIC PANEL WITH GFR
BUN: 24 mg/dL (ref 7–25)
CALCIUM: 9.1 mg/dL (ref 8.6–10.3)
CO2: 27 mmol/L (ref 20–31)
CREATININE: 1.47 mg/dL — AB (ref 0.70–1.18)
Chloride: 102 mmol/L (ref 98–110)
GFR, Est African American: 52 mL/min — ABNORMAL LOW (ref >=60)
GFR, Est Non African American: 45 mL/min — ABNORMAL LOW (ref >=60)
Glucose, Bld: 195 mg/dL — ABNORMAL HIGH (ref 65–99)
Potassium: 3.8 mmol/L (ref 3.5–5.3)
SODIUM: 139 mmol/L (ref 135–146)

## 2016-03-29 LAB — POCT GLYCOSYLATED HEMOGLOBIN (HGB A1C): HEMOGLOBIN A1C: 7.7

## 2016-03-29 LAB — GLUCOSE, POCT (MANUAL RESULT ENTRY): POC GLUCOSE: 229 mg/dL — AB (ref 70–99)

## 2016-03-29 MED ORDER — PIOGLITAZONE HCL 15 MG PO TABS: 15.0000 mg | ORAL_TABLET | Freq: Every day | ORAL | 2 refills | Status: DC

## 2016-03-29 NOTE — Assessment & Plan Note (Addendum)
Check in-house glucose and A1c; foot exam by MD today; continue ACE-I; increase Cardura to lower BP, goal at last under 583 systolic, and by newer guidelines, under 462 systolic may be appropriate

## 2016-03-29 NOTE — Patient Instructions (Addendum)
Please do see your eye doctor regularly, and have your eyes examined every year (or more often per his or her recommendation) Check your feet every night and let me know right away of any sores, infections, numbness, etc. Try to limit sweets, white bread, white rice, white potatoes It is okay with me for you to not check your fingerstick blood sugars (per [employer] of Endocrinology Best Practices), unless you are interested and feel it would be helpful for you Limit simple carbohydrates Please do increase your Cardura from 4 mg to 8 mg daily The cardiologist can refill that for you Do start the pioglitazone (Actos) for your sugars Stop it and call me right away if you develop leg swelling, shortness of breath, weight gain, etc., as one side effect is fluid overload

## 2016-04-07 NOTE — Assessment & Plan Note (Signed)
Not to goal; reviewed cardiologist's note (who primarily manages his BP); increase Cardura from 4 mg to 8 mg; cardiologist prescribes this

## 2016-04-07 NOTE — Progress Notes (Signed)
BP (!) 160/64   Pulse 70   Temp 98.4 F (36.9 C) (Oral)   Resp 16   Wt 180 lb 5 oz (81.8 kg)   SpO2 95%   BMI 28.24 kg/m    Subjective:    Patient ID: Timothy Riley, male    DOB: 09/02/1940, 76 y.o.   MRN: [id number]  HPI: Timothy Riley is a 76 y.o. male  Chief Complaint  Patient presents with  . Diabetes  . Hypertension  . Hyperlipidemia   Patient is here for f/u He has type 2 diabetes Last A1c was 8.5 Glucose 229 today Has been up to 289 at times; drinking diet sodas Bread eater Taking glipizide 10 mg BID No odor to the urine Not snoring or having sleep apnea to his knowledge No dry mouth; no blurred vision; eye exam sometime next month Just saw the heart doctor last week  Saw urologist and was checked out by urologist, Dr. Rogers Blocker, no prostate enlargement, everything checked out patient says  High cholesterol; last LDL was 73, down from 96 drawn 5 months ago Active and goes to the gym and plays golf; no chest pain  Depression screen [facility] 2/9 03/29/2016 09/26/2015 03/29/2015 08/27/2014  Decreased Interest 0 0 0 0  Down, Depressed, Hopeless 1 0 0 0  PHQ - 2 Score 1 0 0 0   Relevant past medical, surgical, family and social history reviewed Past Medical History:  Diagnosis Date  . Aortic stenosis    a. 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 Glower MD @ [facility] via R thoracic approach.  . CKD (chronic kidney disease) stage 3, GFR 30-59 ml/min 06/17/2014  . Diabetes mellitus without complication ([place])   . Hypertension   . Non-obstructive CAD (coronary artery disease)    a. 12/2013 Cath ([facility]): LAD 30p, RI 60, otw nl (per CT surgery [facility] from [facility]).  Marland Kitchen PSVT (paroxysmal supraventricular tachycardia) ([place])    a. 05/2014 s/p RFCA @ [facility].  Marland Kitchen Urinary incontinence    Past Surgical History:  Procedure Laterality Date  . AORTIC VALVE REPLACEMENT    . CARDIAC CATHETERIZATION    . heart valve replacment     Family History  Problem  Relation Age of Onset  . Cancer Mother   . Stroke Father    Social History  Substance Use Topics  . Smoking status: Former Research scientist (life sciences)  . Smokeless tobacco: Never Used     Comment: over 40 yrs ago  . Alcohol use No   Interim medical history since last visit reviewed. Allergies and medications reviewed  Review of Systems Per HPI unless specifically indicated above     Objective:    BP (!) 160/64   Pulse 70   Temp 98.4 F (36.9 C) (Oral)   Resp 16   Wt 180 lb 5 oz (81.8 kg)   SpO2 95%   BMI 28.24 kg/m   Wt Readings from Last 3 Encounters:  03/29/16 180 lb 5 oz (81.8 kg)  03/23/16 183 lb 8 oz (83.2 kg)  10/14/15 183 lb 1.9 oz (83.1 kg)    Physical Exam  Constitutional: He appears well-developed and well-nourished. No distress.  HENT:  Head: Normocephalic and atraumatic.  Eyes: EOM are normal. No scleral icterus.  Neck: No thyromegaly present.  Cardiovascular: Normal rate and regular rhythm.   No murmur heard. Pulmonary/Chest: Effort normal and breath sounds normal.  Abdominal: Soft. Bowel sounds are normal. He exhibits no distension.  Musculoskeletal:  He exhibits no edema.  Neurological: Coordination normal.  Skin: Skin is warm and dry. No pallor.  Psychiatric: He has a normal mood and affect. His behavior is normal. Judgment and thought content normal.   Diabetic Foot Form - Detailed   Diabetic Foot Exam - detailed Diabetic Foot exam was performed with the following findings:  Yes 04/07/2016  6:56 PM  Visual Foot Exam completed.:  Yes  Are the toenails ingrown?:  No Normal Range of Motion:  Yes Pulse Foot Exam completed.:  Yes  Right Dorsalis Pedis:  Present Left Dorsalis Pedis:  Present  Sensory Foot Exam Completed.:  Yes Semmes-Weinstein Monofilament Test R Site 1-Great Toe:  Pos L Site 1-Great Toe:  Pos  R Site 4:  Pos L Site 4:  Pos  R Site 5:  Pos L Site 5:  Pos           Assessment & Plan:   Problem List Items Addressed This Visit       Cardiovascular and Mediastinum   Hypertension goal BP (blood pressure) < 140/90    Not to goal; reviewed cardiologist's note (who primarily manages his BP); increase Cardura from 4 mg to 8 mg; cardiologist prescribes this      Arteriosclerosis of coronary artery    On aspirin and beta-blocker; seeing cardiologist; symptom-free; A1c, lipids, and BP control discussed with patient today        Endocrine   Type II diabetes mellitus, uncontrolled (HCC) - Primary    Check in-house glucose and A1c; foot exam by MD today; continue ACE-I; increase Cardura to lower BP, goal at last under 573 systolic, and by newer guidelines, under 220 systolic may be appropriate      Relevant Medications   pioglitazone (ACTOS) 15 MG tablet   Other Relevant Orders   Bayer DCA Hb A1c Waived   POCT Glucose (CBG) (Completed)   BASIC METABOLIC PANEL WITH GFR (Completed)   POCT HgB A1C (Completed)     Genitourinary   CKD (chronic kidney disease) stage 3, GFR 30-59 ml/min    Continue ACE-I; avoid NSAIDs; hydrated; increase agent per cardiologist's recommendation to lower BP          Follow up plan: Return in about 3 months (around 06/29/2016) for fasting labs and visit.  An after-visit summary was printed and given to the patient at [facility].  Please see the patient instructions which may contain other information and recommendations beyond what is mentioned above in the assessment and plan.  Meds ordered this encounter  Medications  . pioglitazone (ACTOS) 15 MG tablet    Sig: Take 1 tablet (15 mg total) by mouth daily.    Dispense:  30 tablet    Refill:  2    Orders Placed This Encounter  Procedures  . Bayer DCA Hb A1c Waived  . BASIC METABOLIC PANEL WITH GFR  . POCT Glucose (CBG)  . POCT HgB A1C

## 2016-04-07 NOTE — Assessment & Plan Note (Signed)
Continue ACE-I; avoid NSAIDs; hydrated; increase agent per cardiologist's recommendation to lower BP

## 2016-04-07 NOTE — Assessment & Plan Note (Addendum)
On aspirin and beta-blocker; seeing cardiologist; symptom-free; A1c, lipids, and BP control discussed with patient today

## 2016-04-10 ENCOUNTER — Other Ambulatory Visit: Payer: Self-pay

## 2016-04-10 ENCOUNTER — Telehealth: Payer: Self-pay

## 2016-04-10 MED ORDER — DOXAZOSIN MESYLATE 4 MG PO TABS: 4.0000 mg | ORAL_TABLET | Freq: Every day | ORAL | 3 refills | Status: DC

## 2016-04-10 NOTE — Telephone Encounter (Signed)
error 

## 2016-04-23 ENCOUNTER — Other Ambulatory Visit: Payer: Self-pay

## 2016-04-23 ENCOUNTER — Ambulatory Visit (INDEPENDENT_AMBULATORY_CARE_PROVIDER_SITE_OTHER): Payer: Medicare Other

## 2016-04-23 DIAGNOSIS — Z952 Presence of prosthetic heart valve: Secondary | ICD-10-CM | POA: Diagnosis not present

## 2016-05-15 ENCOUNTER — Telehealth: Payer: Self-pay | Admitting: Family Medicine

## 2016-05-15 ENCOUNTER — Telehealth: Payer: Self-pay | Admitting: Cardiovascular Disease

## 2016-05-15 NOTE — Telephone Encounter (Signed)
Patient increased to 8 mg of cardura  for the last month and is running out of medicine  Please call pharmacy to confirm rx change or send new to :   [facility] [phone]   Fax [phone]

## 2016-05-15 NOTE — Telephone Encounter (Signed)
Pt.notified

## 2016-05-15 NOTE — Telephone Encounter (Signed)
I don't prescribe that for him; that comes from Dr. Rockey Situ; thank you

## 2016-05-15 NOTE — Telephone Encounter (Signed)
Chris from [facility] states that he received doxazosin 4 mg prescription. The patient told him that he was instructed to take 8 mg. If that is the case Gerald Stabs is asking for a new prescription to be faxed to [phone] and he can be reached at [phone]

## 2016-05-16 ENCOUNTER — Other Ambulatory Visit: Payer: Self-pay

## 2016-05-16 MED ORDER — DOXAZOSIN MESYLATE 8 MG PO TABS: 8.0000 mg | ORAL_TABLET | Freq: Every day | ORAL | 2 refills | Status: DC

## 2016-05-16 NOTE — Telephone Encounter (Signed)
Pt reports increasing cardura to 8mg  QD since 3/1 PCP OV as BP remained elevated. Ignacia Bayley, NP, aware and agreeable to cardura 8mg . Refills submitted.

## 2016-05-16 NOTE — Telephone Encounter (Signed)
[facility] called regarding cardura stating pt increased to 8mg  last month and will soon run out. Pt's BP elevated at 2/23 OV.  Pt reported SBP 130s at home.  He was instructed to monitor pressure for 1 week and report findings.  If elevated, could increase current dose of 4mg  QD to 8mg  QD.  There are no reported BP readings.  BP elevated at 3/1 PCP office visit at which time, PCP recommended increasing cardura to 8mg . I left a message at pt's home number to call the office.

## 2016-05-16 NOTE — Telephone Encounter (Signed)
Pt requesting refill Cardura 8 mg tablet please advise if ok to refill? pt has Cardura 4 mg tablet on current med list.

## 2016-05-21 ENCOUNTER — Other Ambulatory Visit: Payer: Self-pay | Admitting: Family Medicine

## 2016-05-21 DIAGNOSIS — I10 Essential (primary) hypertension: Secondary | ICD-10-CM

## 2016-05-21 NOTE — Telephone Encounter (Signed)
Last four Cr levels reviewed, last K+ reviewed Rx approved

## 2016-05-28 ENCOUNTER — Other Ambulatory Visit: Payer: Self-pay | Admitting: Family Medicine

## 2016-05-28 DIAGNOSIS — E119 Type 2 diabetes mellitus without complications: Secondary | ICD-10-CM

## 2016-06-29 ENCOUNTER — Encounter: Payer: Self-pay | Admitting: Family Medicine

## 2016-06-29 ENCOUNTER — Ambulatory Visit (INDEPENDENT_AMBULATORY_CARE_PROVIDER_SITE_OTHER): Payer: Medicare Other | Admitting: Family Medicine

## 2016-06-29 VITALS — BP 146/74 | HR 62 | Temp 97.9°F | Resp 16 | Wt 182.4 lb

## 2016-06-29 DIAGNOSIS — E781 Pure hyperglyceridemia: Secondary | ICD-10-CM

## 2016-06-29 DIAGNOSIS — R350 Frequency of micturition: Secondary | ICD-10-CM

## 2016-06-29 DIAGNOSIS — Z5181 Encounter for therapeutic drug level monitoring: Secondary | ICD-10-CM | POA: Diagnosis not present

## 2016-06-29 DIAGNOSIS — N183 Chronic kidney disease, stage 3 unspecified: Secondary | ICD-10-CM

## 2016-06-29 DIAGNOSIS — N401 Enlarged prostate with lower urinary tract symptoms: Secondary | ICD-10-CM | POA: Diagnosis not present

## 2016-06-29 DIAGNOSIS — I10 Essential (primary) hypertension: Secondary | ICD-10-CM | POA: Diagnosis not present

## 2016-06-29 DIAGNOSIS — E1165 Type 2 diabetes mellitus with hyperglycemia: Secondary | ICD-10-CM

## 2016-06-29 DIAGNOSIS — N3943 Post-void dribbling: Secondary | ICD-10-CM

## 2016-06-29 NOTE — Assessment & Plan Note (Signed)
Check glucose and A1c; nocturia might be uncontrolled sugars; drinking diet sodas

## 2016-06-29 NOTE — Assessment & Plan Note (Signed)
No other NSAIDs except 81 mg aspirin

## 2016-06-29 NOTE — Assessment & Plan Note (Signed)
Monitor labs 

## 2016-06-29 NOTE — Assessment & Plan Note (Signed)
Try DASH guidelines 

## 2016-06-29 NOTE — Assessment & Plan Note (Signed)
Managed by urologist 

## 2016-06-29 NOTE — Progress Notes (Signed)
BP (!) 146/74   Pulse 62   Temp 97.9 F (36.6 C) (Oral)   Resp 16   Wt 182 lb 6.4 oz (82.7 kg)   SpO2 96%   BMI 28.57 kg/m    Subjective:    Patient ID: Timothy Riley, male    DOB: [birth year], 76 y.o.   MRN: [id number]  HPI: Timothy Riley is a 76 y.o. male  Chief Complaint  Patient presents with  . Follow-up  . Foot Pain    on top of right foot   HPI Patient is here for f/u  Some pain in the right foot; sharp and fleeting, pressure; mows and plays golf; points right across the top of the dorsum of the arch/midfoot  BP noted today; he eats what he wants; he sees a cardiologist  Type 2 diabetes; no dry mouth; no blurred vision; urinating at night at least 6x; sees a specialist for his prostate; saw urologist 6 months ago and said everything is fine; he had a slow stream and they tested him, checked PSA; he denies sleep apnea  Wife is in [place] and he has been busy with her  CKD; taking aspirin, but no other NSAIDs; not a good water drinker; lots of diet Coke  Depression screen [facility] 2/9 06/29/2016 03/29/2016 09/26/2015 03/29/2015 08/27/2014  Decreased Interest 0 0 0 0 0  Down, Depressed, Hopeless 1 1 0 0 0  PHQ - 2 Score 1 1 0 0 0    Relevant past medical, surgical, family and social history reviewed Past Medical History:  Diagnosis Date  . Aortic stenosis    a. 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 Glower MD @ [facility] via R thoracic approach.  . CKD (chronic kidney disease) stage 3, GFR 30-59 ml/min 06/17/2014  . Diabetes mellitus without complication ([place])   . Hypertension   . Non-obstructive CAD (coronary artery disease)    a. 12/2013 Cath ([facility]): LAD 30p, RI 60, otw nl (per CT surgery [facility] from [facility]).  Marland Kitchen PSVT (paroxysmal supraventricular tachycardia) ([place])    a. 05/2014 s/p RFCA @ [facility].  Marland Kitchen Urinary incontinence    Past Surgical History:  Procedure Laterality Date  . AORTIC VALVE REPLACEMENT    . CARDIAC CATHETERIZATION      . heart valve replacment     Family History  Problem Relation Age of Onset  . Cancer Mother   . Stroke Father    Social History   Social History  . Marital status: Married    Spouse name: N/A  . Number of children: N/A  . Years of education: N/A   Occupational History  . Not on file.   Social History Main Topics  . Smoking status: Former Research scientist (life sciences)  . Smokeless tobacco: Never Used     Comment: over 40 yrs ago  . Alcohol use No  . Drug use: No  . Sexual activity: No   Other Topics Concern  . Not on file   Social History Narrative  . No narrative on file   Interim medical history since last visit reviewed. Allergies and medications reviewed  Review of Systems Per HPI unless specifically indicated above     Objective:    BP (!) 146/74   Pulse 62   Temp 97.9 F (36.6 C) (Oral)   Resp 16   Wt 182 lb 6.4 oz (82.7 kg)   SpO2 96%   BMI 28.57 kg/m   Wt Readings from Last  3 Encounters:  06/29/16 182 lb 6.4 oz (82.7 kg)  03/29/16 180 lb 5 oz (81.8 kg)  03/23/16 183 lb 8 oz (83.2 kg)    Physical Exam  Constitutional: He appears well-developed and well-nourished. No distress.  HENT:  Head: Normocephalic and atraumatic.  Eyes: EOM are normal. No scleral icterus.  Neck: No thyromegaly present.  Cardiovascular: Normal rate and regular rhythm.   No murmur heard. Pulmonary/Chest: Effort normal and breath sounds normal.  Abdominal: Soft. Bowel sounds are normal. He exhibits no distension.  Musculoskeletal: He exhibits no edema.       Right foot: There is no tenderness, no swelling and no deformity.  Neurological: Coordination normal.  Skin: Skin is warm and dry. No pallor.  Psychiatric: He has a normal mood and affect. His behavior is normal. Judgment and thought content normal.   Diabetic Foot Form - Detailed   Diabetic Foot Exam - detailed Diabetic Foot exam was performed with the following findings:  Yes 06/29/2016  2:27 PM  Visual Foot Exam completed.:  Yes   Are the toenails ingrown?:  No Normal Range of Motion:  Yes Pulse Foot Exam completed.:  Yes  Right Dorsalis Pedis:  Present Left Dorsalis Pedis:  Present  Sensory Foot Exam Completed.:  Yes Swelling:  No Semmes-Weinstein Monofilament Test R Site 1-Great Toe:  Pos L Site 1-Great Toe:  Pos  R Site 4:  Pos L Site 4:  Pos  R Site 5:  Pos L Site 5:  Pos        Results for orders placed or performed in visit on 19/41/74  BASIC METABOLIC PANEL WITH GFR  Result Value Ref Range   Sodium 139 135 - 146 mmol/L   Potassium 3.8 3.5 - 5.3 mmol/L   Chloride 102 98 - 110 mmol/L   CO2 27 20 - 31 mmol/L   Glucose, Bld 195 (H) 65 - 99 mg/dL   BUN 24 7 - 25 mg/dL   Creat 1.47 (H) 0.70 - 1.18 mg/dL   Calcium 9.1 8.6 - 10.3 mg/dL   GFR, Est African American 52 (L) >=60 mL/min   GFR, Est Non African American 45 (L) >=60 mL/min  POCT Glucose (CBG)  Result Value Ref Range   POC Glucose 229 (A) 70 - 99 mg/dl  POCT HgB A1C  Result Value Ref Range   Hemoglobin A1C 7.7       Assessment & Plan:   Problem List Items Addressed This Visit      Cardiovascular and Mediastinum   Hypertension goal BP (blood pressure) < 140/90    Try DASH guidelines        Endocrine   Type II diabetes mellitus, uncontrolled (HCC) - Primary    Check glucose and A1c; nocturia might be uncontrolled sugars; drinking diet sodas      Relevant Orders   Hemoglobin A1c   Microalbumin / creatinine urine ratio     Genitourinary   CKD (chronic kidney disease) stage 3, GFR 30-59 ml/min    No other NSAIDs except 81 mg aspirin      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  An after-visit summary was printed and given to the patient at [place].  Please see the patient instructions which may contain other information and recommendations beyond what is mentioned above in the assessment and plan.  No orders of the defined types were placed in this encounter.   Orders Placed This Encounter  Procedures  . Hemoglobin A1c  . COMPLETE METABOLIC PANEL WITH GFR  . Lipid panel  . Urinalysis w microscopic + reflex cultur  . Microalbumin / creatinine urine ratio

## 2016-06-29 NOTE — Patient Instructions (Addendum)
Try turmeric as a natural anti-inflammatory (for pain and arthritis). It comes in capsules where you buy aspirin and fish oil, but also as a spice where you buy pepper and garlic powder. Try some arch supports in your shoes Let me know if your foot pain persists and we can get an xray or have you see a podiatrist Please do see your eye doctor regularly, and have your eyes examined every year (or more often per his or her recommendation) Check your feet every night and let me know right away of any sores, infections, numbness, etc. Try to limit sweets, white bread, white rice, white potatoes It is okay with me for you to not check your fingerstick blood sugars (per [employer] of Endocrinology Best Practices), unless you are interested and feel it would be helpful for you Try to limit saturated fats in your diet (bologna, hot dogs, barbeque, cheeseburgers, hamburgers, steak, bacon, sausage, cheese, etc.) and get more fresh fruits, vegetables, and whole grains    DASH Eating Plan DASH stands for "Dietary Approaches to Stop Hypertension." The DASH eating plan is a healthy eating plan that has been shown to reduce high blood pressure (hypertension). It may also reduce your risk for type 2 diabetes, heart disease, and stroke. The DASH eating plan may also help with weight loss. What are tips for following this plan? General guidelines  Avoid eating more than 2,300 mg (milligrams) of salt (sodium) a day. If you have hypertension, you may need to reduce your sodium intake to 1,500 mg a day.  Limit alcohol intake to no more than 1 drink a day for nonpregnant women and 2 drinks a day for men. One drink equals 12 oz of beer, 5 oz of wine, or 1 oz of hard liquor.  Work with your health care provider to maintain a healthy body weight or to lose weight. Ask what an ideal weight is for you.  Get at least 30 minutes of exercise that causes your heart to beat faster (aerobic exercise) most days of the  week. Activities may include walking, swimming, or biking.  Work with your health care provider or diet and nutrition specialist (dietitian) to adjust your eating plan to your individual calorie needs. Reading food labels  Check food labels for the amount of sodium per serving. Choose foods with less than 5 percent of the Daily Value of sodium. Generally, foods with less than 300 mg of sodium per serving fit into this eating plan.  To find whole grains, look for the word "whole" as the first word in the ingredient list. Shopping  Buy products labeled as "low-sodium" or "no salt added."  Buy fresh foods. Avoid canned foods and premade or frozen meals. Cooking  Avoid adding salt when cooking. Use salt-free seasonings or herbs instead of table salt or sea salt. Check with your health care provider or pharmacist before using salt substitutes.  Do not fry foods. Cook foods using healthy methods such as baking, boiling, grilling, and broiling instead.  Cook with heart-healthy oils, such as olive, canola, soybean, or sunflower oil. Meal planning   Eat a balanced diet that includes: ? 5 or more servings of fruits and vegetables each day. At each meal, try to fill half of your plate with fruits and vegetables. ? Up to 6-8 servings of whole grains each day. ? Less than 6 oz of lean meat, poultry, or fish each day. A 3-oz serving of meat is about the same size as a deck  of cards. One egg equals 1 oz. ? 2 servings of low-fat dairy each day. ? A serving of nuts, seeds, or beans 5 times each week. ? Heart-healthy fats. Healthy fats called Omega-3 fatty acids are found in foods such as flaxseeds and coldwater fish, like sardines, salmon, and mackerel.  Limit how much you eat of the following: ? Canned or prepackaged foods. ? Food that is high in trans fat, such as fried foods. ? Food that is high in saturated fat, such as fatty meat. ? Sweets, desserts, sugary drinks, and other foods with added  sugar. ? Full-fat dairy products.  Do not salt foods before eating.  Try to eat at least 2 vegetarian meals each week.  Eat more home-cooked food and less restaurant, buffet, and fast food.  When eating at a restaurant, ask that your food be prepared with less salt or no salt, if possible. What foods are recommended? The items listed may not be a complete list. Talk with your dietitian about what dietary choices are best for you. Grains Whole-grain or whole-wheat bread. Whole-grain or whole-wheat pasta. Brown rice. [place]. Bulgur. Whole-grain and low-sodium cereals. Pita bread. Low-fat, low-sodium crackers. Whole-wheat flour tortillas. Vegetables Fresh or frozen vegetables (raw, steamed, roasted, or grilled). Low-sodium or reduced-sodium tomato and vegetable juice. Low-sodium or reduced-sodium tomato sauce and tomato paste. Low-sodium or reduced-sodium canned vegetables. Fruits All fresh, dried, or frozen fruit. Canned fruit in natural juice (without added sugar). Meat and other protein foods Skinless chicken or [place]. Ground chicken or [place]. Pork with fat trimmed off. Fish and seafood. Egg whites. Dried beans, peas, or lentils. Unsalted nuts, nut butters, and seeds. Unsalted canned beans. Lean cuts of beef with fat trimmed off. Low-sodium, lean deli meat. Dairy Low-fat (1%) or fat-free (skim) milk. Fat-free, low-fat, or reduced-fat cheeses. Nonfat, low-sodium ricotta or cottage cheese. Low-fat or nonfat yogurt. Low-fat, low-sodium cheese. Fats and oils Soft margarine without trans fats. Vegetable oil. Low-fat, reduced-fat, or light mayonnaise and salad dressings (reduced-sodium). Canola, safflower, olive, soybean, and sunflower oils. Avocado. Seasoning and other foods Herbs. Spices. Seasoning mixes without salt. Unsalted popcorn and pretzels. Fat-free sweets. What foods are not recommended? The items listed may not be a complete list. Talk with your dietitian about what  dietary choices are best for you. Grains Baked goods made with fat, such as croissants, muffins, or some breads. Dry pasta or rice meal packs. Vegetables Creamed or fried vegetables. Vegetables in a cheese sauce. Regular canned vegetables (not low-sodium or reduced-sodium). Regular canned tomato sauce and paste (not low-sodium or reduced-sodium). Regular tomato and vegetable juice (not low-sodium or reduced-sodium). Angie Fava. Olives. Fruits Canned fruit in a light or heavy syrup. Fried fruit. Fruit in cream or butter sauce. Meat and other protein foods Fatty cuts of meat. Ribs. Fried meat. Berniece Salines. Sausage. Bologna and other processed lunch meats. Salami. Fatback. Hotdogs. Bratwurst. Salted nuts and seeds. Canned beans with added salt. Canned or smoked fish. Whole eggs or egg yolks. Chicken or [place] with skin. Dairy Whole or 2% milk, cream, and half-and-half. Whole or full-fat cream cheese. Whole-fat or sweetened yogurt. Full-fat cheese. Nondairy creamers. Whipped toppings. Processed cheese and cheese spreads. Fats and oils Butter. Stick margarine. Lard. Shortening. Ghee. Bacon fat. Tropical oils, such as coconut, palm kernel, or palm oil. Seasoning and other foods Salted popcorn and pretzels. Onion salt, garlic salt, seasoned salt, table salt, and sea salt. Worcestershire sauce. Tartar sauce. Barbecue sauce. Teriyaki sauce. Soy sauce, including reduced-sodium. Steak sauce. Canned and  packaged gravies. Fish sauce. Oyster sauce. Cocktail sauce. Horseradish that you find on the shelf. Ketchup. Mustard. Meat flavorings and tenderizers. Bouillon cubes. Hot sauce and Tabasco sauce. Premade or packaged marinades. Premade or packaged taco seasonings. Relishes. Regular salad dressings. Where to find more information:  National Heart, Lung, and [place]: https://wilson-eaton.com/  American Heart Association: www.heart.org Summary  The DASH eating plan is a healthy eating plan that has been shown to reduce  high blood pressure (hypertension). It may also reduce your risk for type 2 diabetes, heart disease, and stroke.  With the DASH eating plan, you should limit salt (sodium) intake to 2,300 mg a day. If you have hypertension, you may need to reduce your sodium intake to 1,500 mg a day.  When on the DASH eating plan, aim to eat more fresh fruits and vegetables, whole grains, lean proteins, low-fat dairy, and heart-healthy fats.  Work with your health care provider or diet and nutrition specialist (dietitian) to adjust your eating plan to your individual calorie needs. This information is not intended to replace advice given to you by your health care provider. Make sure you discuss any questions you have with your health care provider. Document Released: 01/04/2011 Document Revised: 01/09/2016 Document Reviewed: 01/09/2016 Elsevier Interactive Patient Education  2017 [employer].

## 2016-06-29 NOTE — Assessment & Plan Note (Signed)
Check lipids; last PO intake was two eggs, buttered toast, two pieces of bacon grits for breakfast; no lunch (four hours ago)

## 2016-06-30 ENCOUNTER — Telehealth: Payer: Self-pay | Admitting: Family Medicine

## 2016-06-30 LAB — COMPLETE METABOLIC PANEL WITH GFR
ALT: 21 U/L (ref 9–46)
AST: 29 U/L (ref 10–35)
Albumin: 4.2 g/dL (ref 3.6–5.1)
Alkaline Phosphatase: 79 U/L (ref 40–115)
BUN: 24 mg/dL (ref 7–25)
CHLORIDE: 102 mmol/L (ref 98–110)
CO2: 26 mmol/L (ref 20–31)
Calcium: 9.3 mg/dL (ref 8.6–10.3)
Creat: 1.58 mg/dL — ABNORMAL HIGH (ref 0.70–1.18)
GFR, EST AFRICAN AMERICAN: 48 mL/min — AB (ref >=60)
GFR, EST NON AFRICAN AMERICAN: 42 mL/min — AB (ref >=60)
Glucose, Bld: 184 mg/dL — ABNORMAL HIGH (ref 65–99)
Potassium: 3.7 mmol/L (ref 3.5–5.3)
Sodium: 140 mmol/L (ref 135–146)
Total Bilirubin: 0.7 mg/dL (ref 0.2–1.2)
Total Protein: 6.4 g/dL (ref 6.1–8.1)

## 2016-06-30 LAB — URINALYSIS W MICROSCOPIC + REFLEX CULTURE
BACTERIA UA: NONE SEEN [HPF]
BILIRUBIN URINE: NEGATIVE
CRYSTALS: NONE SEEN [HPF]
Casts: NONE SEEN [LPF]
Hgb urine dipstick: NEGATIVE
Leukocytes, UA: NEGATIVE
Nitrite: NEGATIVE
RBC / HPF: NONE SEEN RBC/HPF (ref <=2)
Specific Gravity, Urine: 1.03 (ref 1.001–1.035)
YEAST: NONE SEEN [HPF]
pH: 5 (ref 5.0–8.0)

## 2016-06-30 LAB — LIPID PANEL
CHOL/HDL RATIO: 5.7 ratio — AB (ref <5.0)
Cholesterol: 149 mg/dL (ref <200)
HDL: 26 mg/dL — AB (ref >40)
LDL CALC: 53 mg/dL (ref <100)
TRIGLYCERIDES: 349 mg/dL — AB (ref <150)
VLDL: 70 mg/dL — AB (ref <30)

## 2016-06-30 LAB — HEMOGLOBIN A1C
HEMOGLOBIN A1C: 8.2 % — AB (ref <5.7)
MEAN PLASMA GLUCOSE: 189 mg/dL

## 2016-06-30 LAB — MICROALBUMIN / CREATININE URINE RATIO
CREATININE, URINE: 536 mg/dL — AB (ref 20–370)
Microalb Creat Ratio: 21 mcg/mg creat (ref <30)
Microalb, Ur: 11 mg/dL

## 2016-06-30 MED ORDER — PIOGLITAZONE HCL 30 MG PO TABS: 30.0000 mg | ORAL_TABLET | Freq: Every day | ORAL | 6 refills | Status: DC

## 2016-06-30 NOTE — Telephone Encounter (Signed)
I left message for patient increase Actos from 15 mg to 30 mg daily Stop it and call if any SHOB or leg edema Start krill oil BID Really work on healthy eating Avoid NSAIDs; have to watch kidneys Call on Wednesday with any questions

## 2016-08-15 DIAGNOSIS — J40 Bronchitis, not specified as acute or chronic: Secondary | ICD-10-CM | POA: Diagnosis not present

## 2016-08-15 DIAGNOSIS — B9689 Other specified bacterial agents as the cause of diseases classified elsewhere: Secondary | ICD-10-CM | POA: Diagnosis not present

## 2016-08-15 DIAGNOSIS — J019 Acute sinusitis, unspecified: Secondary | ICD-10-CM | POA: Diagnosis not present

## 2016-08-16 ENCOUNTER — Ambulatory Visit: Payer: Medicare Other | Admitting: Family Medicine

## 2016-08-22 ENCOUNTER — Other Ambulatory Visit: Payer: Self-pay | Admitting: Family Medicine

## 2016-08-22 DIAGNOSIS — N183 Chronic kidney disease, stage 3 unspecified: Secondary | ICD-10-CM

## 2016-08-22 DIAGNOSIS — I10 Essential (primary) hypertension: Secondary | ICD-10-CM

## 2016-08-23 NOTE — Telephone Encounter (Signed)
Please contact nephrologist and see if he is willing to take over the ACE-I prescription

## 2016-08-24 ENCOUNTER — Other Ambulatory Visit: Payer: Self-pay

## 2016-08-24 ENCOUNTER — Telehealth: Payer: Self-pay

## 2016-08-24 DIAGNOSIS — I10 Essential (primary) hypertension: Secondary | ICD-10-CM

## 2016-08-24 MED ORDER — LISINOPRIL 40 MG PO TABS: 40.0000 mg | ORAL_TABLET | Freq: Every day | ORAL | 0 refills | Status: DC

## 2016-08-24 MED ORDER — LISINOPRIL 40 MG PO TABS: 40.0000 mg | ORAL_TABLET | Freq: Every day | ORAL | 3 refills | Status: DC

## 2016-08-24 NOTE — Telephone Encounter (Signed)
Please re-refer to nephrologist, Dx N18.3 CKD stage 3 I'll fill this one more time, but let him know that we really want him to see a kidney doctor

## 2016-08-24 NOTE — Assessment & Plan Note (Signed)
Refer to nephrologist 

## 2016-08-24 NOTE — Telephone Encounter (Signed)
I looked in the chart and I don't see he ahs a kidney doctor. I saw he was referred in 09/2015 for a kidney doctor. I called the pt and he states he do not have a kidney doctor.

## 2016-08-24 NOTE — Telephone Encounter (Signed)
Refill sent for Lisinopril 40 mg

## 2016-08-27 ENCOUNTER — Other Ambulatory Visit: Payer: Self-pay

## 2016-08-27 DIAGNOSIS — N183 Chronic kidney disease, stage 3 unspecified: Secondary | ICD-10-CM

## 2016-08-27 NOTE — Telephone Encounter (Signed)
Referral has been put in and the pt has been notified

## 2016-09-10 DIAGNOSIS — I10 Essential (primary) hypertension: Secondary | ICD-10-CM | POA: Diagnosis not present

## 2016-09-10 DIAGNOSIS — E1122 Type 2 diabetes mellitus with diabetic chronic kidney disease: Secondary | ICD-10-CM | POA: Diagnosis not present

## 2016-09-10 DIAGNOSIS — R809 Proteinuria, unspecified: Secondary | ICD-10-CM | POA: Diagnosis not present

## 2016-09-10 DIAGNOSIS — N183 Chronic kidney disease, stage 3 (moderate): Secondary | ICD-10-CM | POA: Diagnosis not present

## 2016-09-10 DIAGNOSIS — E1129 Type 2 diabetes mellitus with other diabetic kidney complication: Secondary | ICD-10-CM | POA: Diagnosis not present

## 2016-09-15 ENCOUNTER — Other Ambulatory Visit: Payer: Self-pay | Admitting: Family Medicine

## 2016-10-11 ENCOUNTER — Other Ambulatory Visit: Payer: Self-pay

## 2016-10-12 ENCOUNTER — Ambulatory Visit: Payer: Self-pay | Admitting: Podiatry

## 2016-10-18 ENCOUNTER — Ambulatory Visit: Payer: Medicare Other | Admitting: Family Medicine

## 2016-11-22 DIAGNOSIS — M79671 Pain in right foot: Secondary | ICD-10-CM | POA: Diagnosis not present

## 2016-11-22 DIAGNOSIS — M19071 Primary osteoarthritis, right ankle and foot: Secondary | ICD-10-CM | POA: Diagnosis not present

## 2016-11-22 DIAGNOSIS — Q6650 Congenital pes planus, unspecified foot: Secondary | ICD-10-CM | POA: Diagnosis not present

## 2016-11-28 ENCOUNTER — Other Ambulatory Visit: Payer: Self-pay | Admitting: Emergency Medicine

## 2016-11-28 ENCOUNTER — Other Ambulatory Visit: Payer: Self-pay | Admitting: Family Medicine

## 2016-11-28 DIAGNOSIS — E119 Type 2 diabetes mellitus without complications: Secondary | ICD-10-CM

## 2016-11-28 MED ORDER — GLIPIZIDE 10 MG PO TABS: 10.0000 mg | ORAL_TABLET | Freq: Two times a day (BID) | ORAL | 0 refills | Status: DC

## 2016-11-28 NOTE — Telephone Encounter (Signed)
1 month supply of medication called to pharmacy, patient notified, per Dr. Sanda Klein

## 2017-01-02 ENCOUNTER — Telehealth: Payer: Self-pay | Admitting: Family Medicine

## 2017-01-02 DIAGNOSIS — E119 Type 2 diabetes mellitus without complications: Secondary | ICD-10-CM

## 2017-01-02 DIAGNOSIS — I10 Essential (primary) hypertension: Secondary | ICD-10-CM

## 2017-01-02 NOTE — Telephone Encounter (Signed)
Please call patient He was due for an appointment in September No upcoming appointments Please ask him to come in as soon as convenient for fasting labs and a visit with me I'll refill med Thank you

## 2017-01-03 NOTE — Telephone Encounter (Signed)
Left message informing patient that prescription has been sent to the pharmacy however he is needing to schedule appointment with Dr Sanda Klein.

## 2017-01-04 NOTE — Telephone Encounter (Signed)
Pt called back and scheduled appointment for 01/11/17

## 2017-01-09 ENCOUNTER — Other Ambulatory Visit: Payer: Self-pay | Admitting: Family Medicine

## 2017-01-09 NOTE — Telephone Encounter (Signed)
appt this week Rx approved

## 2017-01-11 ENCOUNTER — Ambulatory Visit: Payer: Medicare Other | Admitting: Family Medicine

## 2017-01-11 ENCOUNTER — Encounter: Payer: Self-pay | Admitting: Family Medicine

## 2017-01-11 VITALS — BP 130/78 | HR 73 | Temp 98.2°F | Resp 16 | Wt 190.7 lb

## 2017-01-11 DIAGNOSIS — E119 Type 2 diabetes mellitus without complications: Secondary | ICD-10-CM | POA: Diagnosis not present

## 2017-01-11 DIAGNOSIS — I350 Nonrheumatic aortic (valve) stenosis: Secondary | ICD-10-CM

## 2017-01-11 DIAGNOSIS — Z5181 Encounter for therapeutic drug level monitoring: Secondary | ICD-10-CM

## 2017-01-11 DIAGNOSIS — R748 Abnormal levels of other serum enzymes: Secondary | ICD-10-CM | POA: Diagnosis not present

## 2017-01-11 DIAGNOSIS — E781 Pure hyperglyceridemia: Secondary | ICD-10-CM | POA: Diagnosis not present

## 2017-01-11 DIAGNOSIS — I2510 Atherosclerotic heart disease of native coronary artery without angina pectoris: Secondary | ICD-10-CM

## 2017-01-11 NOTE — Assessment & Plan Note (Signed)
Stay active; check lipids today

## 2017-01-11 NOTE — Assessment & Plan Note (Signed)
Check fasting lipids today 

## 2017-01-11 NOTE — Assessment & Plan Note (Signed)
Check A1c; foot exam done by MD today; eye exam yearly

## 2017-01-11 NOTE — Assessment & Plan Note (Signed)
Monitored by cardiologist

## 2017-01-11 NOTE — Patient Instructions (Signed)
Stay active Continue medicines Please do see your eye doctor regularly, and have your eyes examined every year (or more often per his or her recommendation) Check your feet every night and let me know right away of any sores, infections, numbness, etc. Try to limit sweets, white bread, white rice, white potatoes It is okay with me for you to not check your fingerstick blood sugars (per [employer] of Endocrinology Best Practices), unless you are interested and feel it would be helpful for you

## 2017-01-11 NOTE — Progress Notes (Signed)
BP 130/78   Pulse 73   Temp 98.2 F (36.8 C) (Oral)   Resp 16   Wt 190 lb 11.2 oz (86.5 kg)   SpO2 98%   BMI 29.87 kg/m    Subjective:    Patient ID: Timothy Riley, male    DOB: [birth year], 76 y.o.   MRN: [id number]  HPI: Timothy Riley is a 76 y.o. male  Chief Complaint  Patient presents with  . Follow-up  . Medication Refill    HPI Patient is here for f/u Some pain in the arches of the feet; saw podiatrist and was told it's just arthritis, related t his age He saw the nephrologist, for CKD stage 3; he was told he was no where near dialysis; avoiding NSAIDs; not a big water drinker Family member (by marriage) broke a hip, got pneumonia, then died; we talked about falls, hips, calcium intake; he isn't eating many greens HTN; taking medicines High cholesterol; high TG; taking fish oil; eating out, lots of french fries and baked potatoes Type 2 diabetes; no dry mouth; no blurred vision; potato; drinks orange juice, half a glass a day, otherwise not drinking calories CAD, seeing Dr. Rockey Situ twice a day BPH; urinating okay; has urologist, but doesn't feel like going back; no obstruction  Depression screen [facility] 2/9 01/11/2017 06/29/2016 03/29/2016 09/26/2015 03/29/2015  Decreased Interest 0 0 0 0 0  Down, Depressed, Hopeless 0 1 1 0 0  PHQ - 2 Score 0 1 1 0 0    Relevant past medical, surgical, family and social history reviewed Past Medical History:  Diagnosis Date  . Aortic stenosis    a. 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 Glower MD @ [facility] via R thoracic approach.  . CKD (chronic kidney disease) stage 3, GFR 30-59 ml/min (HCC) 06/17/2014  . Diabetes mellitus without complication ([place])   . Hypertension   . Non-obstructive CAD (coronary artery disease)    a. 12/2013 Cath ([facility]): LAD 30p, RI 60, otw nl (per CT surgery note from [facility]).  Marland Kitchen PSVT (paroxysmal supraventricular tachycardia) ([place])    a. 05/2014 s/p RFCA @ [facility].  Marland Kitchen Urinary  incontinence    Past Surgical History:  Procedure Laterality Date  . AORTIC VALVE REPLACEMENT    . CARDIAC CATHETERIZATION    . heart valve replacment     Family History  Problem Relation Age of Onset  . Cancer Mother   . Stroke Father    Social History   Tobacco Use  . Smoking status: Former Research scientist (life sciences)  . Smokeless tobacco: Never Used  . Tobacco comment: over 40 yrs ago  Substance Use Topics  . Alcohol use: No    Alcohol/week: 0.0 oz  . Drug use: No   Interim medical history since last visit reviewed. Allergies and medications reviewed  Review of Systems  Respiratory: Negative for shortness of breath.   Cardiovascular: Negative for chest pain.   Per HPI unless specifically indicated above     Objective:    BP 130/78   Pulse 73   Temp 98.2 F (36.8 C) (Oral)   Resp 16   Wt 190 lb 11.2 oz (86.5 kg)   SpO2 98%   BMI 29.87 kg/m   Wt Readings from Last 3 Encounters:  01/11/17 190 lb 11.2 oz (86.5 kg)  06/29/16 182 lb 6.4 oz (82.7 kg)  03/29/16 180 lb 5 oz (81.8 kg)    Physical Exam  Constitutional: He appears well-developed  and well-nourished. No distress.  HENT:  Head: Normocephalic and atraumatic.  Eyes: EOM are normal. No scleral icterus.  Neck: No thyromegaly present.  Cardiovascular: Normal rate and regular rhythm.  No murmur heard. Pulmonary/Chest: Effort normal and breath sounds normal.  Abdominal: Soft. Bowel sounds are normal. He exhibits no distension.  Musculoskeletal: He exhibits no edema.       Right foot: There is no tenderness, no swelling and no deformity.  Neurological: Coordination normal.  Skin: Skin is warm and dry. No pallor.  Psychiatric: He has a normal mood and affect. His behavior is normal. Judgment and thought content normal.   Diabetic Foot Form - Detailed   Diabetic Foot Exam - detailed Diabetic Foot exam was performed with the following findings:  Yes 01/11/2017  9:06 AM  Visual Foot Exam completed.:  Yes  Pulse Foot Exam  completed.:  Yes  Right Dorsalis Pedis:  Present Left Dorsalis Pedis:  Present  Sensory Foot Exam Completed.:  Yes Semmes-Weinstein Monofilament Test R Site 1-Great Toe:  Pos L Site 1-Great Toe:  Pos          Assessment & Plan:   Problem List Items Addressed This Visit      Cardiovascular and Mediastinum   Arteriosclerosis of coronary artery    No chest pain; continue statin, beta-blocker, and aspirin      Aortic heart valve narrowing    Monitored by cardiologist        Endocrine   Diabetes mellitus type 2, uncomplicated ([place]) - Primary    Check A1c; foot exam done by MD today; eye exam yearly      Relevant Orders   Hemoglobin A1c     Other   Low serum HDL    Stay active; check lipids today      Relevant Orders   Lipid panel   Hypertriglyceridemia    Check fasting lipids today      Relevant Orders   Lipid panel   Encounter for medication monitoring    Check liver and kidneys      Relevant Orders   COMPLETE METABOLIC PANEL WITH GFR       Follow up plan: Return in about 6 months (around 07/12/2017) for twenty minute follow-up with fasting labs; Medicare Wellness visit when due.  An after-visit summary was printed and given to the patient at [place].  Please see the patient instructions which may contain other information and recommendations beyond what is mentioned above in the assessment and plan.  No orders of the defined types were placed in this encounter.   Orders Placed This Encounter  Procedures  . Lipid panel  . Hemoglobin A1c  . COMPLETE METABOLIC PANEL WITH GFR

## 2017-01-11 NOTE — Assessment & Plan Note (Signed)
Check liver and kidneys 

## 2017-01-11 NOTE — Assessment & Plan Note (Signed)
No chest pain; continue statin, beta-blocker, and aspirin

## 2017-01-12 ENCOUNTER — Other Ambulatory Visit: Payer: Self-pay | Admitting: Family Medicine

## 2017-01-12 LAB — COMPLETE METABOLIC PANEL WITH GFR
AG RATIO: 1.9 (calc) (ref 1.0–2.5)
ALBUMIN MSPROF: 4.2 g/dL (ref 3.6–5.1)
ALT: 18 U/L (ref 9–46)
AST: 26 U/L (ref 10–35)
Alkaline phosphatase (APISO): 65 U/L (ref 40–115)
BUN/Creatinine Ratio: 19 (calc) (ref 6–22)
BUN: 28 mg/dL — ABNORMAL HIGH (ref 7–25)
CALCIUM: 9.1 mg/dL (ref 8.6–10.3)
CO2: 27 mmol/L (ref 20–32)
Chloride: 104 mmol/L (ref 98–110)
Creat: 1.48 mg/dL — ABNORMAL HIGH (ref 0.70–1.18)
GFR, EST AFRICAN AMERICAN: 52 mL/min/{1.73_m2} — AB (ref >=60)
GFR, EST NON AFRICAN AMERICAN: 45 mL/min/{1.73_m2} — AB (ref >=60)
GLOBULIN: 2.2 g/dL (ref 1.9–3.7)
Glucose, Bld: 178 mg/dL — ABNORMAL HIGH (ref 65–99)
POTASSIUM: 4.2 mmol/L (ref 3.5–5.3)
SODIUM: 140 mmol/L (ref 135–146)
TOTAL PROTEIN: 6.4 g/dL (ref 6.1–8.1)
Total Bilirubin: 0.6 mg/dL (ref 0.2–1.2)

## 2017-01-12 LAB — HEMOGLOBIN A1C
EAG (MMOL/L): 9 (calc)
HEMOGLOBIN A1C: 7.3 %{Hb} — AB (ref <5.7)
MEAN PLASMA GLUCOSE: 163 (calc)

## 2017-01-12 LAB — LIPID PANEL
CHOLESTEROL: 169 mg/dL (ref <200)
HDL: 30 mg/dL — AB (ref >40)
LDL Cholesterol (Calc): 106 mg/dL (calc) — ABNORMAL HIGH
Non-HDL Cholesterol (Calc): 139 mg/dL (calc) — ABNORMAL HIGH (ref <130)
Total CHOL/HDL Ratio: 5.6 (calc) — ABNORMAL HIGH (ref <5.0)
Triglycerides: 210 mg/dL — ABNORMAL HIGH (ref <150)

## 2017-01-12 MED ORDER — ROSUVASTATIN CALCIUM 10 MG PO TABS: 10.0000 mg | ORAL_TABLET | Freq: Every day | ORAL | 3 refills | Status: DC

## 2017-01-12 NOTE — Progress Notes (Signed)
Increase statin See patient back in 3 months

## 2017-01-14 ENCOUNTER — Ambulatory Visit: Payer: Medicare Other | Admitting: Family Medicine

## 2017-01-14 ENCOUNTER — Telehealth: Payer: Self-pay

## 2017-01-14 NOTE — Telephone Encounter (Signed)
Called cell number no answer. Unable to leave message as voicemail is full. Called home number, rang busy. Will try again. Lab results routed to [facility] nurse triage, Thor created.

## 2017-01-14 NOTE — Telephone Encounter (Signed)
-----   Message from Arnetha Courser, MD sent at 01/12/2017  8:00 AM EST ----- Please let Mr. Launer know that: 1. His HDL cholesterol has come up a little, so he's heading in the right direction; we want that HDL up over 40 to help decrease the chance of a heart attack; weight loss and walking will help bring that number up; with walking, start slow and build up gradually 2. His LDL and non-HDL are too high, so I'd like to increase his Crestor from 5 mg to 10 mg (Rx sent) 3. He has really brought his A1c down, almost a full point; work on weight loss; if he can lose 10-12 pounds before his next appointment (one pound a week), we might be able to not add more medicine 4. We'll need see him back in 3 months, so please ask him to go ahead and schedule an appointment for around March 14th

## 2017-01-15 NOTE — Telephone Encounter (Signed)
Called pt informed him of the information below. Pt gave verbal understanding.  

## 2017-01-26 ENCOUNTER — Other Ambulatory Visit: Payer: Self-pay | Admitting: Family Medicine

## 2017-01-26 DIAGNOSIS — E119 Type 2 diabetes mellitus without complications: Secondary | ICD-10-CM

## 2017-01-31 ENCOUNTER — Other Ambulatory Visit: Payer: Self-pay | Admitting: Family Medicine

## 2017-02-14 ENCOUNTER — Other Ambulatory Visit: Payer: Self-pay | Admitting: Nurse Practitioner

## 2017-02-28 ENCOUNTER — Other Ambulatory Visit: Payer: Self-pay

## 2017-02-28 DIAGNOSIS — I10 Essential (primary) hypertension: Secondary | ICD-10-CM

## 2017-02-28 MED ORDER — METOPROLOL SUCCINATE ER 50 MG PO TB24: 50.0000 mg | ORAL_TABLET | Freq: Every day | ORAL | 3 refills | Status: DC

## 2017-03-19 ENCOUNTER — Other Ambulatory Visit: Payer: Self-pay | Admitting: Cardiovascular Disease

## 2017-03-19 NOTE — Telephone Encounter (Signed)
Closing out old note from 2018

## 2017-03-19 NOTE — Progress Notes (Signed)
Closing out lab/order note open since:  July 2018 

## 2017-03-19 NOTE — Telephone Encounter (Signed)
Pt is coming 2/25 for f/u with [facility]. Do I fill for 8mg  or 4mg ? Thank you.

## 2017-03-19 NOTE — Progress Notes (Signed)
Closing out lab/order note open since:  Oct 2018

## 2017-03-19 NOTE — Telephone Encounter (Signed)
It looks like he is taking 8 mg as of 05/15/16 phone note. Please call to clarify prior to filling.   Thank you!

## 2017-03-19 NOTE — Progress Notes (Signed)
Closing out lab/order note open since:  2018

## 2017-03-20 NOTE — Telephone Encounter (Addendum)
LMOM to contact our office for clarification on medication dose.

## 2017-03-22 NOTE — Telephone Encounter (Signed)
NA x 1 @ 12:17.  The patient's voice mail is full and can't leave a message on cell phone.

## 2017-03-23 NOTE — Progress Notes (Unsigned)
Cardiology Office Note  Date:  03/23/2017   ID:  Timothy Riley, DOB [birth year], MRN [id number]  PCP:  Arnetha Courser, MD   No chief complaint on file.   HPI:  Timothy Riley is a 77 year old gentleman with history of   Severe aortic valve stenosis,  status post AVR 03/01/2014 at [facility] with bovine valve,  SVT episodes since his AVR, requiring ablation 06/30/2014 at [facility],  diabetes type 2 hemoglobin A1c 7.8,  BPH,  normal ejection fraction  who presents for routine follow-up of his bioprosthetic valve, hypertension  In follow-up, he reports that he is doing well Initial blood pressure elevated on arrival, reports he has been rushing Systolic Blood pressure 174B on cardura 4 mg daily Monitoring blood pressure at home Doing well, active with no complaints No recent lab work available HBA1C 7.8  06/2014  Previous echocardiogram done at [facility] April 2016, not available in our system  EKG on today's visit shows normal sinus rhythm with rate 57 bpm, right bundle branch block  Other past medical history He was hypotensive on a previous clinic visit, stopped his tamsulosin, chlorthalidone Reports having tried a statin in the past, felt jittery  Previous notes from outside cardiologist says he has mild coronary artery disease, details not available through care everywhere Followed by endocrine for his diabetes, hemoglobin A1c of >7  Prior episodes of SVT requiring adenosine He denies any significant CAD by prior catheterization.    PMH:   has a past medical history of Aortic stenosis, CKD (chronic kidney disease) stage 3, GFR 30-59 ml/min (HCC) (06/17/2014), Diabetes mellitus without complication ([place]), Hypertension, Non-obstructive CAD (coronary artery disease), PSVT (paroxysmal supraventricular tachycardia) ([place]), and Urinary incontinence.  PSH:    Past Surgical History:  Procedure Laterality Date  . AORTIC VALVE REPLACEMENT    . CARDIAC CATHETERIZATION    . heart  valve replacment      Current Outpatient Medications  Medication Sig Dispense Refill  . amLODipine (NORVASC) 10 MG tablet TAKE 1 TABLET BY MOUTH DAILY 90 tablet 0  . aspirin 81 MG tablet Take 81 mg by mouth daily. At bed time.    Marland Kitchen doxazosin (CARDURA) 8 MG tablet TAKE 1 TABLET BY MOUTH DAILY 30 tablet 0  . glipiZIDE (GLUCOTROL) 10 MG tablet TAKE 1 TABLET BY MOUTH TWICE A DAY BEFORE A MEAL. 60 tablet 5  . lisinopril (PRINIVIL,ZESTRIL) 40 MG tablet Take 1 tablet (40 mg total) by mouth daily. 90 tablet 3  . metoprolol succinate (TOPROL-XL) 50 MG 24 hr tablet Take 1 tablet (50 mg total) by mouth daily. 90 tablet 3  . Omega-3 Krill Oil 500 MG CAPS One by mouth twice a day    . pioglitazone (ACTOS) 30 MG tablet TAKE 1 TABLET BY MOUTH ONCE A DAY 30 tablet 6  . rosuvastatin (CRESTOR) 10 MG tablet Take 1 tablet (10 mg total) by mouth daily. 90 tablet 3   No current facility-administered medications for this visit.      Allergies:   Hydralazine hcl   Social History:  The patient  reports that he has quit smoking. he has never used smokeless tobacco. He reports that he does not drink alcohol or use drugs.   Family History:   family history includes Cancer in his mother; Stroke in his father.    Review of Systems: Review of Systems  Constitutional: Negative.   Respiratory: Negative.   Cardiovascular: Negative.   Gastrointestinal: Negative.   Musculoskeletal: Negative.   Neurological: Negative.  Psychiatric/Behavioral: Negative.   All other systems reviewed and are negative.    PHYSICAL EXAM: VS:  There were no vitals taken for this visit. , BMI There is no height or weight on file to calculate BMI. GEN: Well nourished, well developed, in no acute distress  HEENT: normal  Neck: no JVD, carotid bruits, or masses Cardiac: RRR; no murmurs, rubs, or gallops,no edema  Respiratory:  clear to auscultation bilaterally, normal work of breathing GI: soft, nontender, nondistended, + BS MS: no  deformity or atrophy  Skin: warm and dry, no rash Neuro:  Strength and sensation are intact Psych: euthymic mood, full affect    Recent Labs: 01/11/2017: ALT 18; BUN 28; Creat 1.48; Potassium 4.2; Sodium 140    Lipid Panel Lab Results  Component Value Date   CHOL 169 01/11/2017   HDL 30 (L) 01/11/2017   LDLCALC 53 06/29/2016   TRIG 210 (H) 01/11/2017      Wt Readings from Last 3 Encounters:  01/11/17 190 lb 11.2 oz (86.5 kg)  06/29/16 182 lb 6.4 oz (82.7 kg)  03/29/16 180 lb 5 oz (81.8 kg)       ASSESSMENT AND PLAN:  Hypertension goal BP (blood pressure) < 140/90 - Plan: EKG 12-Lead Blood pressure elevated on arrival He reports well-controlled blood pressure at home on his current medication regimen Recommended he continue to monitor numbers at home and call our office if these run high Medications refilled  Chronic renal insufficiency No recent lab work available, previous creatinine 1.3 Encouraged fluids Repeat lab work pending  Arteriosclerosis of coronary artery - Plan: EKG 12-Lead Previous cardiac catheterization not available We have requested this for our system  S/P AVR (aortic valve replacement) - Plan: EKG 12-Lead We will try to obtain his previous echocardiogram April 2016 He does not want repeat studies at this time as he feels well   Total encounter time more than 25 minutes  Greater than 50% was spent in counseling and coordination of care with the patient   Disposition:   F/U  12 months   No orders of the defined types were placed in this encounter.    Signed, Esmond Plants, M.D., Ph.D. 03/23/2017  [place], [place]

## 2017-03-25 ENCOUNTER — Ambulatory Visit: Payer: Medicare Other | Admitting: Cardiovascular Disease

## 2017-03-28 NOTE — Progress Notes (Signed)
Cardiology Office Note  Date:  03/29/2017   ID:  Timothy Riley, DOB 06-10-1940, MRN [id number]  PCP:  Arnetha Courser, MD   Chief Complaint  Patient presents with  . OTHER    12 month f/u no complaints today. Meds reviewed verbally with pt.    HPI:  Timothy Riley is a very pleasant 77 year old gentleman with history of   Severe aortic valve stenosis,  status post AVR 03/01/2014 at [facility] with bovine valve,  SVT episodes since his AVR, requiring ablation 06/30/2014 again at [facility],  diabetes type 2 with most recent hemoglobin A1c 7.3,  BPH,  normal ejection fraction  who presents for routine follow-up of his bioprosthetic valve, hypertension  In follow-up reports he is doing well,  Wife in [place], has debility, memory issues Takes care of her, spends hours with her every day Weight up 10 pounds, eats a lot of take out No regular exercise program  Lab work reviewed with him in detail HAB1C 7.3, previously was 8.5  Reports blood pressure is stable  EKG personally reviewed by myself on todays visit Shows normal sinus rhythm with rate 54 bpm right bundle branch block  Previous echocardiogram reviewed with him in detail from  03/2016 - Left ventricle: The cavity size was normal. Wall thickness was   increased increased in a pattern of mild to moderate LVH.   Systolic function was normal. The estimated ejection fraction was   in the range of 55% to 65%. Hypokinesis of the anteroseptal   myocardium. Doppler parameters are consistent with abnormal left   ventricular relaxation (grade 1 diastolic dysfunction). - Aortic valve: A bioprosthesis was present. Transvalvular velocity   was increased. There was mild stenosis. There was trivial   regurgitation. Peak velocity (S): 277 cm/s. Mean gradient (S): 18   mm Hg. Peak gradient (S): 31 mm Hg. - Left atrium: The atrium was mildly dilated. - Right ventricle: Systolic function was normal. - Pulmonary arteries: Systolic pressure was  within the normal   range.  Previous echocardiogram done at [facility] April 2016, not available in our system  Other past medical history He was hypotensive on a previous clinic visit, stopped his tamsulosin, chlorthalidone Reports having tried a statin in the past, felt jittery  Previous notes from outside cardiologist says he has mild coronary artery disease, details not available through care everywhere Followed by endocrine for his diabetes, hemoglobin A1c of >7  Prior episodes of SVT requiring adenosine He denies any significant CAD by prior catheterization.    PMH:   has a past medical history of Aortic stenosis, CKD (chronic kidney disease) stage 3, GFR 30-59 ml/min (HCC) (06/17/2014), Diabetes mellitus without complication ([place]), Hypertension, Non-obstructive CAD (coronary artery disease), PSVT (paroxysmal supraventricular tachycardia) ([place]), and Urinary incontinence.  PSH:    Past Surgical History:  Procedure Laterality Date  . AORTIC VALVE REPLACEMENT    . CARDIAC CATHETERIZATION    . heart valve replacment      Current Outpatient Medications  Medication Sig Dispense Refill  . amLODipine (NORVASC) 10 MG tablet TAKE 1 TABLET BY MOUTH DAILY 90 tablet 0  . aspirin 81 MG tablet Take 81 mg by mouth daily. At bed time.    Marland Kitchen doxazosin (CARDURA) 8 MG tablet TAKE 1 TABLET BY MOUTH ONCE A DAY 30 tablet 0  . glipiZIDE (GLUCOTROL) 10 MG tablet TAKE 1 TABLET BY MOUTH TWICE A DAY BEFORE A MEAL. 60 tablet 5  . lisinopril (PRINIVIL,ZESTRIL) 40 MG tablet Take  1 tablet (40 mg total) by mouth daily. 90 tablet 3  . metoprolol succinate (TOPROL-XL) 50 MG 24 hr tablet Take 1 tablet (50 mg total) by mouth daily. 90 tablet 3  . Omega-3 Krill Oil 500 MG CAPS One by mouth twice a day    . pioglitazone (ACTOS) 30 MG tablet TAKE 1 TABLET BY MOUTH ONCE A DAY 30 tablet 6  . POTASSIUM PO Take by mouth daily.    . rosuvastatin (CRESTOR) 10 MG tablet Take 1 tablet (10 mg total) by mouth daily.  90 tablet 3   No current facility-administered medications for this visit.      Allergies:   Hydralazine hcl   Social History:  The patient  reports that he has quit smoking. he has never used smokeless tobacco. He reports that he does not drink alcohol or use drugs.   Family History:   family history includes Cancer in his mother; Stroke in his father.    Review of Systems: Review of Systems  Constitutional: Negative.   Respiratory: Negative.   Cardiovascular: Negative.   Gastrointestinal: Negative.   Musculoskeletal: Positive for joint pain.       Pain in the right foot  Neurological: Negative.   Psychiatric/Behavioral: Negative.   All other systems reviewed and are negative.    PHYSICAL EXAM: VS:  BP 120/70 (BP Location: Left Arm, Patient Position: Sitting, Cuff Size: Normal)   Pulse (!) 54   Ht 5\' 8"  (1.727 m)   Wt 195 lb (88.5 kg)   BMI 29.65 kg/m  , BMI Body mass index is 29.65 kg/m. Constitutional:  oriented to person, place, and time. No distress.  HENT:  Head: Normocephalic and atraumatic.  Eyes:  no discharge. No scleral icterus.  Neck: Normal range of motion. Neck supple. No JVD present.  Cardiovascular: Normal rate, regular rhythm, normal heart sounds and intact distal pulses. Exam reveals no gallop and no friction rub. No edema 1/6 SEM RSB Pulmonary/Chest: Effort normal and breath sounds normal. No stridor. No respiratory distress.  no wheezes.  no rales.  no tenderness.  Abdominal: Soft.  no distension.  no tenderness.  Musculoskeletal: Normal range of motion.  no  tenderness or deformity.  Neurological:  normal muscle tone. Coordination normal. No atrophy Skin: Skin is warm and dry. No rash noted. not diaphoretic.  Psychiatric:  normal mood and affect. behavior is normal. Thought content normal.        Recent Labs: 01/11/2017: ALT 18; BUN 28; Creat 1.48; Potassium 4.2; Sodium 140    Lipid Panel Lab Results  Component Value Date   CHOL 169  01/11/2017   HDL 30 (L) 01/11/2017   LDLCALC 53 06/29/2016   TRIG 210 (H) 01/11/2017      Wt Readings from Last 3 Encounters:  03/29/17 195 lb (88.5 kg)  01/11/17 190 lb 11.2 oz (86.5 kg)  06/29/16 182 lb 6.4 oz (82.7 kg)      ASSESSMENT AND PLAN:  Hypertension goal BP (blood pressure) < 140/90 - Plan: EKG 12-Lead Blood pressure is well controlled on today's visit. No changes made to the medications.  Chronic renal insufficiency Creatinine 1.48 BUN 28 Likely secondary to underlying diabetes  Arteriosclerosis of coronary artery - Plan: EKG 12-Lead Currently with no symptoms of angina. No further workup at this time. Continue current medication regimen.  S/P AVR (aortic valve replacement) - Plan: EKG 12-Lead Echocardiogram 2018 stable prosthetic valve  Hyperlipidemia Recommend he stay on his Crestor   Total encounter time more  than 25 minutes  Greater than 50% was spent in counseling and coordination of care with the patient   Disposition:   F/U  12 months   Orders Placed This Encounter  Procedures  . EKG 12-Lead     Signed, Esmond Plants, M.D., Ph.D. 03/29/2017  [place], [place]

## 2017-03-29 ENCOUNTER — Encounter: Payer: Self-pay | Admitting: Cardiovascular Disease

## 2017-03-29 ENCOUNTER — Ambulatory Visit: Payer: Medicare Other | Admitting: Cardiovascular Disease

## 2017-03-29 VITALS — BP 120/70 | HR 54 | Ht 68.0 in | Wt 195.0 lb

## 2017-03-29 DIAGNOSIS — Z952 Presence of prosthetic heart valve: Secondary | ICD-10-CM

## 2017-03-29 DIAGNOSIS — Z8679 Personal history of other diseases of the circulatory system: Secondary | ICD-10-CM

## 2017-03-29 DIAGNOSIS — E782 Mixed hyperlipidemia: Secondary | ICD-10-CM

## 2017-03-29 DIAGNOSIS — I471 Supraventricular tachycardia: Secondary | ICD-10-CM

## 2017-03-29 DIAGNOSIS — I10 Essential (primary) hypertension: Secondary | ICD-10-CM | POA: Diagnosis not present

## 2017-03-29 DIAGNOSIS — N182 Chronic kidney disease, stage 2 (mild): Secondary | ICD-10-CM

## 2017-03-29 DIAGNOSIS — E118 Type 2 diabetes mellitus with unspecified complications: Secondary | ICD-10-CM | POA: Diagnosis not present

## 2017-03-29 DIAGNOSIS — I129 Hypertensive chronic kidney disease with stage 1 through stage 4 chronic kidney disease, or unspecified chronic kidney disease: Secondary | ICD-10-CM

## 2017-03-29 DIAGNOSIS — I350 Nonrheumatic aortic (valve) stenosis: Secondary | ICD-10-CM | POA: Diagnosis not present

## 2017-03-29 DIAGNOSIS — N183 Chronic kidney disease, stage 3 unspecified: Secondary | ICD-10-CM

## 2017-03-29 DIAGNOSIS — E785 Hyperlipidemia, unspecified: Secondary | ICD-10-CM | POA: Insufficient documentation

## 2017-03-29 DIAGNOSIS — E119 Type 2 diabetes mellitus without complications: Secondary | ICD-10-CM | POA: Diagnosis not present

## 2017-03-29 NOTE — Patient Instructions (Addendum)
Medication Instructions:   No medication changes made  Labwork:  No new labs needed  Testing/Procedures:  No further testing at this time   Follow-Up: It was a pleasure seeing you in the office today. Please call us if you have new issues that need to be addressed before your next appt.  [phone]  Your physician wants you to follow-up in: 12 months.  You will receive a reminder letter in the mail two months in advance. If you don't receive a letter, please call our office to schedule the follow-up appointment.  If you need a refill on your cardiac medications before your next appointment, please call your pharmacy.  For educational health videos Log in to : [email or web address] Or : [email or web address], password : [email or web address]   Dr. Tommi Rumps [facility] [place] [phone]

## 2017-04-01 ENCOUNTER — Other Ambulatory Visit: Payer: Self-pay | Admitting: Family Medicine

## 2017-04-01 DIAGNOSIS — I10 Essential (primary) hypertension: Secondary | ICD-10-CM

## 2017-04-08 ENCOUNTER — Encounter: Payer: Self-pay | Admitting: Family Medicine

## 2017-04-08 ENCOUNTER — Ambulatory Visit: Payer: Medicare Other | Admitting: Family Medicine

## 2017-04-08 VITALS — BP 130/84 | HR 74 | Temp 98.2°F | Resp 18 | Ht 68.0 in | Wt 195.6 lb

## 2017-04-08 DIAGNOSIS — M79671 Pain in right foot: Secondary | ICD-10-CM

## 2017-04-08 DIAGNOSIS — E119 Type 2 diabetes mellitus without complications: Secondary | ICD-10-CM

## 2017-04-08 DIAGNOSIS — M79672 Pain in left foot: Secondary | ICD-10-CM

## 2017-04-08 MED ORDER — GABAPENTIN 100 MG PO CAPS: 300.0000 mg | ORAL_CAPSULE | Freq: Every day | ORAL | 0 refills | Status: DC

## 2017-04-08 MED ORDER — GABAPENTIN 100 MG PO CAPS: ORAL_CAPSULE | ORAL | 0 refills | Status: DC

## 2017-04-08 NOTE — Progress Notes (Signed)
Name: Timothy Riley   MRN: [id number]    DOB: 11/09/1940   Date:04/08/2017       Progress Note  Subjective  Chief Complaint  Chief Complaint  Patient presents with  . Foot Swelling    bilateral foot pain possible due to diabetic neuropathy    HPI  PT presents with concern for bilateral foot pain ongoing for 6 months intermittently, but it has gotten significantly worse over the last 2 months.  He describes the pain as diffuse dorsal and planter numbness/burning, unbearable to walk. He saw Dr. Vickki Muff on 11/22/16 and was told he had arthritis in his right foot, pt tried arch support per Dr. Alvera Singh recommendation but this did not help; he does have supportive athletic footwear that he wears daily when at home and when outside of the home. 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  Has not taken anything for pain control.  Has had diabetes for 20 years, and is concerned that he has diabetic neuropathy. Most recent A1C was 01/11/2017 was 7.3%; does not check BG's at home except every once in awhile and fasting BG has been in the low 100's.  He denies any back pain, radiating pain, redness, swelling, ulceration, temperature changes of the skin.  Patient Active Problem List   Diagnosis Date Noted  . Hyperlipidemia 03/29/2017  . Hypertriglyceridemia 10/26/2015  . Low serum HDL 10/26/2015  . Encounter for medication monitoring 09/26/2015  . Medicare annual wellness visit, subsequent 03/29/2015  . H/O colonoscopy with polypectomy 08/27/2014  . S/P AVR (aortic valve replacement) 08/24/2014  . 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  . Hypertension goal BP (blood pressure) < 140/90 08/24/2014  . Need for pneumococcal vaccination 08/24/2014  . CKD (chronic kidney disease) stage 3, GFR 30-59 ml/min (HCC) 06/17/2014  . H/O paroxysmal supraventricular tachycardia 05/31/2014  . Awareness of heartbeats 03/31/2014  . 1st degree AV block  03/04/2014  . 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 Arteriosclerosis of coronary artery 01/11/2014  . Aortic heart valve narrowing 12/31/2013  . 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 Combined fat and carbohydrate induced hyperlipemia 12/01/2013  . Diabetes mellitus type 2, uncomplicated ([place]) 65/78/4696    Social History   Tobacco Use  . Smoking status: Former Research scientist (life sciences)  . Smokeless tobacco: Never Used  . Tobacco comment: over 40 yrs ago  Substance Use Topics  . Alcohol use: No    Alcohol/week: 0.0 oz     Current Outpatient Medications:  .  amLODipine (NORVASC) 10 MG tablet, TAKE 1 TABLET BY MOUTH ONCE A DAY, Disp: 90 tablet, Rfl: 1 .  aspirin 81 MG tablet, Take 81 mg by mouth daily. At bed time., Disp: , Rfl:  .  doxazosin (CARDURA) 8 MG tablet, TAKE 1 TABLET BY MOUTH ONCE A DAY, Disp: 30 tablet, Rfl: 0 .  glipiZIDE (GLUCOTROL) 10 MG tablet, TAKE 1 TABLET BY MOUTH TWICE A DAY BEFORE A MEAL., Disp: 60 tablet, Rfl: 5 .  lisinopril (PRINIVIL,ZESTRIL) 40 MG tablet, Take 1 tablet (40 mg total) by mouth daily., Disp: 90 tablet, Rfl: 3 .  metoprolol succinate (TOPROL-XL) 50 MG 24 hr tablet, Take 1 tablet (50 mg total) by mouth daily., Disp: 90 tablet, Rfl: 3 .  Omega-3 Krill Oil 500 MG CAPS, One by mouth twice a day, Disp: , Rfl:  .  pioglitazone (ACTOS) 30 MG tablet, TAKE 1 TABLET BY MOUTH ONCE  A DAY, Disp: 30 tablet, Rfl: 6 .  POTASSIUM PO, Take by mouth daily., Disp: , Rfl:  .  rosuvastatin (CRESTOR) 10 MG tablet, Take 1 tablet (10 mg total) by mouth daily., Disp: 90 tablet, Rfl: 3  Allergies  Allergen Reactions  . Hydralazine Hcl Rash    ROS  Constitutional: Negative for fever or weight change.  Respiratory: Negative for cough and shortness of breath.   Cardiovascular: Negative for chest pain or palpitations.  Gastrointestinal: Negative for abdominal pain, no bowel changes.  Musculoskeletal:  Negative for gait problem or joint swelling.  Skin: Negative for rash.  Neurological: Negative for dizziness or headache.  No other specific complaints in a complete review of systems (except as listed in HPI above).  Objective  Vitals:   04/08/17 1420  BP: 130/84  Pulse: 74  Resp: 18  Temp: 98.2 F (36.8 C)  TempSrc: Oral  SpO2: 95%  Weight: 195 lb 9.6 oz (88.7 kg)  Height: 5\' 8"  (1.727 m)   Body mass index is 29.74 kg/m.  Nursing Note and Vital Signs reviewed.  Physical Exam  Constitutional: Patient appears well-developed and well-nourished. Obese. No distress.  HEENT: head atraumatic, normocephalic Cardiovascular: Normal rate, regular rhythm, S1/S2 present.  No murmur or rub heard. No BLE edema. Pulmonary/Chest: Effort normal and breath sounds clear. No respiratory distress or retractions. Abdominal: Soft and non-tender, bowel sounds present x4 quadrants.  Psychiatric: Patient has a normal mood and affect. behavior is normal. Judgment and thought content normal. Musculoskeletal: Normal range of motion, no joint effusions. No gross deformities.  Pedal pulses are +2 bilaterallly.  Neurological: he is alert and oriented to person, place, and time. No cranial nerve deficit. Coordination, balance, strength, speech and gait are normal.  Skin: Skin is warm and dry. No rash noted. No erythema.  Psychiatric: Patient has a normal mood and affect. behavior is normal. Judgment and thought content normal.  Diabetic Foot Form - Detailed   Diabetic Foot Exam - detailed Can the patient see the bottom of their feet?:  Yes Are the shoes appropriate in style and fit?:  Yes Is there swelling or and abnormal foot shape?:  No Is there a claw toe deformity?:  No Is there elevated skin temparature?:  No Is there foot or ankle muscle weakness?:  No Normal Range of Motion:  Yes Pulse Foot Exam completed.:  Yes  Right posterior Tibialias:  Present Left posterior Tibialias:  Present  Right  Dorsalis Pedis:  Present Left Dorsalis Pedis:  Present  Semmes-Weinstein Monofilament Test R Site 1-Great Toe:  Pos L Site 1-Great Toe:  Pos       No results found for this or any previous visit (from the past 72 hour(s)).  Assessment & Plan  1. Bilateral foot pain - Discussed symptoms, possible causes, and possible treatments at length. Pt would like to try tapering up on low-dose gabapentin at this time per orders.  Renal function is reviewed - Creatinine clearance calculated to be 51.  Discussed potential sedative effect of gabapentin and patient verbalizes understanding. - gabapentin (NEURONTIN) 100 MG capsule; Start with 1 capsule at night, may increase by 1 capsule every 3 days to maximum of 3 capsules/day at bedtime.  Dispense: 90 capsule; Refill: 0 - If gabapentin is not helping pain, we will consider sending back to Dr. Vickki Muff for further evaluation. - Return for 3 week foot pain follow up.  2. Type 2 diabetes mellitus without complication, without long-term current use of insulin ([place]) -  Continue medications as prescribed.

## 2017-04-20 ENCOUNTER — Other Ambulatory Visit: Payer: Self-pay | Admitting: Cardiovascular Disease

## 2017-04-25 ENCOUNTER — Other Ambulatory Visit: Payer: Self-pay | Admitting: Family Medicine

## 2017-04-25 DIAGNOSIS — M79671 Pain in right foot: Secondary | ICD-10-CM

## 2017-04-25 DIAGNOSIS — M79672 Pain in left foot: Principal | ICD-10-CM

## 2017-04-30 MED ORDER — GABAPENTIN 300 MG PO CAPS: 300.0000 mg | ORAL_CAPSULE | Freq: Every day | ORAL | 5 refills | Status: DC

## 2017-04-30 NOTE — Telephone Encounter (Signed)
Please send to [place]  Pt would like to pickup today

## 2017-05-01 ENCOUNTER — Telehealth: Payer: Self-pay | Admitting: Family Medicine

## 2017-05-01 MED ORDER — GABAPENTIN 100 MG PO CAPS
100.0000 mg | ORAL_CAPSULE | Freq: Every day | ORAL | 0 refills | Status: DC
Start: 2017-05-01 — End: 2017-10-17

## 2017-05-01 NOTE — Addendum Note (Signed)
Addended by: Hildegarde Dunaway, Satira Anis on: 05/01/2017 03:07 PM   Modules accepted: Orders

## 2017-05-01 NOTE — Telephone Encounter (Signed)
Pt. Reports he started having dizziness, some lethargy, arms and legs feel heavy, soon after he increased his Neurontin to 300 mg. States it helped his neuropathy, but if these are side effects he can't tolerate it. Please advise pt. He will be at [facility] this afternoon with his wife - [phone].

## 2017-05-01 NOTE — Telephone Encounter (Signed)
Have him STOP the 300 mg strength Go back to just the 100 mg strength; I sent a new Rx for him If any issues with that, stop it and call us

## 2017-05-01 NOTE — Telephone Encounter (Signed)
Unable to leave voicemail, created crm

## 2017-05-02 NOTE — Telephone Encounter (Signed)
Left detailed voicemail

## 2017-05-03 ENCOUNTER — Ambulatory Visit: Payer: Self-pay | Admitting: *Deleted

## 2017-05-03 NOTE — Telephone Encounter (Signed)
Addressed by office. Clearing from NT.

## 2017-05-15 DIAGNOSIS — Z85820 Personal history of malignant melanoma of skin: Secondary | ICD-10-CM | POA: Diagnosis not present

## 2017-05-15 DIAGNOSIS — C44311 Basal cell carcinoma of skin of nose: Secondary | ICD-10-CM | POA: Diagnosis not present

## 2017-05-15 DIAGNOSIS — C4491 Basal cell carcinoma of skin, unspecified: Secondary | ICD-10-CM

## 2017-05-15 DIAGNOSIS — L820 Inflamed seborrheic keratosis: Secondary | ICD-10-CM | POA: Diagnosis not present

## 2017-05-15 DIAGNOSIS — Z85828 Personal history of other malignant neoplasm of skin: Secondary | ICD-10-CM | POA: Diagnosis not present

## 2017-05-15 HISTORY — DX: Basal cell carcinoma of skin, unspecified: C44.91

## 2017-05-21 ENCOUNTER — Other Ambulatory Visit: Payer: Self-pay | Admitting: Family Medicine

## 2017-05-22 ENCOUNTER — Telehealth: Payer: Self-pay | Admitting: Cardiovascular Disease

## 2017-05-22 NOTE — Telephone Encounter (Signed)
New Message  Pt c/o medication issue:  1. Name of Medication:  Amoxicillin 500 mg capsule take 4 capsules by mouth 30 minutes before dental procedure  2. How are you currently taking this medication (dosage and times per day)?  See above  3. Are you having a reaction (difficulty breathing--STAT)?  N/A  4. What is your medication issue?  Dentist office wants to know if this is for life for every procedure or just this procedure.  Please f/u

## 2017-05-22 NOTE — Telephone Encounter (Signed)
Spoke with Adonis Brook at pharmacy and advised that Dr. Sanda Klein sent that in 05/22/17. She verbalized understanding with no further questions.

## 2017-05-22 NOTE — Telephone Encounter (Signed)
Pt requesting Refill for Amoxicillin. Please advise not cardiac medication.

## 2017-06-22 ENCOUNTER — Other Ambulatory Visit: Payer: Self-pay | Admitting: Family Medicine

## 2017-06-22 DIAGNOSIS — E119 Type 2 diabetes mellitus without complications: Secondary | ICD-10-CM

## 2017-06-24 ENCOUNTER — Other Ambulatory Visit: Payer: Self-pay | Admitting: Family Medicine

## 2017-06-24 DIAGNOSIS — E119 Type 2 diabetes mellitus without complications: Secondary | ICD-10-CM

## 2017-07-15 ENCOUNTER — Other Ambulatory Visit: Payer: Self-pay | Admitting: Family Medicine

## 2017-07-15 DIAGNOSIS — E119 Type 2 diabetes mellitus without complications: Secondary | ICD-10-CM

## 2017-07-31 ENCOUNTER — Ambulatory Visit: Payer: Self-pay

## 2017-07-31 NOTE — Telephone Encounter (Signed)
Please direct patient to see his eye doctor if he develops any vision changes, pain, worsening symptoms; if just subconjunctival hemorrhage (what sounds to be described), that should resolve on its own

## 2017-07-31 NOTE — Telephone Encounter (Signed)
Please advise 

## 2017-07-31 NOTE — Telephone Encounter (Signed)
Patient was informed via voicemail of Dr. Delight Ovens recommendations.

## 2017-07-31 NOTE — Telephone Encounter (Signed)
Pt. Reports he noticed the corner of his eye was "blood shot a couple of days ago." Denies pain, drainage, vision change.Does not wear contacts. Does not remember any injury. Encouraged to continue to observe the area of redness. If no better by Friday, will call us back.

## 2017-08-14 ENCOUNTER — Other Ambulatory Visit: Payer: Self-pay | Admitting: Cardiovascular Disease

## 2017-08-14 DIAGNOSIS — I10 Essential (primary) hypertension: Secondary | ICD-10-CM

## 2017-08-30 ENCOUNTER — Other Ambulatory Visit: Payer: Self-pay | Admitting: Family Medicine

## 2017-09-26 ENCOUNTER — Other Ambulatory Visit: Payer: Self-pay | Admitting: Family Medicine

## 2017-09-26 ENCOUNTER — Telehealth: Payer: Self-pay | Admitting: Family Medicine

## 2017-09-26 DIAGNOSIS — I10 Essential (primary) hypertension: Secondary | ICD-10-CM

## 2017-09-26 NOTE — Telephone Encounter (Signed)
-----   Message from Fredderick Severance, NP sent at 09/26/2017 10:18 AM EDT ----- Please call to schedule routine follow-up with PCP within the next 3 months. 3 month refill given.

## 2017-09-26 NOTE — Telephone Encounter (Signed)
Called [phone] @ 11:09 [phone]. Left voice message informing pt to schedule appt with Dr Sanda Klein within the next 3 months. Also informed that a prescription has been sent to the pharmacy

## 2017-10-16 ENCOUNTER — Telehealth: Payer: Self-pay | Admitting: Family Medicine

## 2017-10-16 ENCOUNTER — Other Ambulatory Visit: Payer: Self-pay | Admitting: Family Medicine

## 2017-10-16 NOTE — Telephone Encounter (Signed)
Copied from [place] [id number]. Topic: Quick Communication - See Telephone Encounter >> Oct 16, 2017  9:21 AM Sheran Luz wrote: CRM for notification. See Telephone encounter for: 10/16/17.  Pt called stating that he has discontinued use of gabapentin(2 months ago) due to it making him experience dizziness and lethargy. Pt noted that the medication worked great for him otherwise. Pt now states that he is experiencing a tingling sensation in feet as well as pain. Pt would like to know if there is something similar to gabapentin but w/o the drowsiness that he could be prescribed until he is seen at his next appointment on 10/8. Please advise.

## 2017-10-16 NOTE — Telephone Encounter (Signed)
Pt notified. Will discuss at upcoming visit on 11/05/17.

## 2017-10-16 NOTE — Telephone Encounter (Signed)
Patient was due for a visit for his diabetes around September 11th He has upcoming appointment soon [facility] discuss his symptoms at his visit for this issue, or he can see NP sooner

## 2017-10-16 NOTE — Telephone Encounter (Signed)
[place] w/[facility] sent over a refill for Gabapentin, but she sent it over by mistake.  So ignore this request.

## 2017-10-17 MED ORDER — GABAPENTIN 100 MG PO CAPS: 100.0000 mg | ORAL_CAPSULE | Freq: Every day | ORAL | 2 refills | Status: DC

## 2017-10-26 ENCOUNTER — Other Ambulatory Visit: Payer: Self-pay | Admitting: Family Medicine

## 2017-10-29 DIAGNOSIS — I4819 Other persistent atrial fibrillation: Secondary | ICD-10-CM

## 2017-10-29 HISTORY — DX: Other persistent atrial fibrillation: I48.19

## 2017-11-05 ENCOUNTER — Telehealth: Payer: Self-pay | Admitting: Cardiovascular Disease

## 2017-11-05 ENCOUNTER — Ambulatory Visit (INDEPENDENT_AMBULATORY_CARE_PROVIDER_SITE_OTHER): Payer: Medicare Other | Admitting: Family Medicine

## 2017-11-05 ENCOUNTER — Emergency Department: Payer: Medicare Other

## 2017-11-05 ENCOUNTER — Encounter: Payer: Self-pay | Admitting: Family Medicine

## 2017-11-05 ENCOUNTER — Encounter: Payer: Self-pay | Admitting: Emergency Medicine

## 2017-11-05 ENCOUNTER — Other Ambulatory Visit: Payer: Self-pay

## 2017-11-05 ENCOUNTER — Emergency Department
Admission: EM | Admit: 2017-11-05 | Discharge: 2017-11-05 | Disposition: A | Discharge status: Home / Self Care | Payer: Medicare Other | Attending: Emergency Medicine | Admitting: Emergency Medicine

## 2017-11-05 VITALS — BP 128/76 | HR 99 | Temp 97.9°F | Ht 68.0 in | Wt 196.4 lb

## 2017-11-05 DIAGNOSIS — Z7982 Long term (current) use of aspirin: Secondary | ICD-10-CM | POA: Diagnosis not present

## 2017-11-05 DIAGNOSIS — I4891 Unspecified atrial fibrillation: Secondary | ICD-10-CM | POA: Diagnosis not present

## 2017-11-05 DIAGNOSIS — N183 Chronic kidney disease, stage 3 unspecified: Secondary | ICD-10-CM

## 2017-11-05 DIAGNOSIS — E1121 Type 2 diabetes mellitus with diabetic nephropathy: Secondary | ICD-10-CM | POA: Diagnosis not present

## 2017-11-05 DIAGNOSIS — E1165 Type 2 diabetes mellitus with hyperglycemia: Secondary | ICD-10-CM | POA: Diagnosis not present

## 2017-11-05 DIAGNOSIS — I350 Nonrheumatic aortic (valve) stenosis: Secondary | ICD-10-CM

## 2017-11-05 DIAGNOSIS — N3943 Post-void dribbling: Secondary | ICD-10-CM

## 2017-11-05 DIAGNOSIS — IMO0002 Reserved for concepts with insufficient information to code with codable children: Secondary | ICD-10-CM

## 2017-11-05 DIAGNOSIS — E119 Type 2 diabetes mellitus without complications: Secondary | ICD-10-CM

## 2017-11-05 DIAGNOSIS — R7989 Other specified abnormal findings of blood chemistry: Secondary | ICD-10-CM | POA: Diagnosis not present

## 2017-11-05 DIAGNOSIS — Z87891 Personal history of nicotine dependence: Secondary | ICD-10-CM | POA: Diagnosis not present

## 2017-11-05 DIAGNOSIS — Z79899 Other long term (current) drug therapy: Secondary | ICD-10-CM | POA: Insufficient documentation

## 2017-11-05 DIAGNOSIS — E1122 Type 2 diabetes mellitus with diabetic chronic kidney disease: Secondary | ICD-10-CM | POA: Diagnosis not present

## 2017-11-05 DIAGNOSIS — I10 Essential (primary) hypertension: Secondary | ICD-10-CM | POA: Diagnosis not present

## 2017-11-05 DIAGNOSIS — N401 Enlarged prostate with lower urinary tract symptoms: Secondary | ICD-10-CM

## 2017-11-05 DIAGNOSIS — Z0000 Encounter for general adult medical examination without abnormal findings: Secondary | ICD-10-CM | POA: Insufficient documentation

## 2017-11-05 DIAGNOSIS — I499 Cardiac arrhythmia, unspecified: Secondary | ICD-10-CM | POA: Diagnosis not present

## 2017-11-05 DIAGNOSIS — Z5181 Encounter for therapeutic drug level monitoring: Secondary | ICD-10-CM

## 2017-11-05 DIAGNOSIS — R748 Abnormal levels of other serum enzymes: Secondary | ICD-10-CM

## 2017-11-05 DIAGNOSIS — M19071 Primary osteoarthritis, right ankle and foot: Secondary | ICD-10-CM

## 2017-11-05 DIAGNOSIS — E118 Type 2 diabetes mellitus with unspecified complications: Secondary | ICD-10-CM | POA: Insufficient documentation

## 2017-11-05 DIAGNOSIS — M19072 Primary osteoarthritis, left ankle and foot: Secondary | ICD-10-CM

## 2017-11-05 DIAGNOSIS — E663 Overweight: Secondary | ICD-10-CM

## 2017-11-05 DIAGNOSIS — Z952 Presence of prosthetic heart valve: Secondary | ICD-10-CM

## 2017-11-05 LAB — CBC WITH DIFFERENTIAL/PLATELET
Abs Immature Granulocytes: 0.04 10*3/uL (ref 0.00–0.07)
BASOS ABS: 0 10*3/uL (ref 0.0–0.1)
BASOS PCT: 1 %
EOS PCT: 2 %
Eosinophils Absolute: 0.1 10*3/uL (ref 0.0–0.5)
HCT: 38.2 % — ABNORMAL LOW (ref 39.0–52.0)
Hemoglobin: 13 g/dL (ref 13.0–17.0)
Immature Granulocytes: 1 %
Lymphocytes Relative: 26 %
Lymphs Abs: 1.5 10*3/uL (ref 0.7–4.0)
MCH: 30.2 pg (ref 26.0–34.0)
MCHC: 34 g/dL (ref 30.0–36.0)
MCV: 88.8 fL (ref 80.0–100.0)
MONO ABS: 0.6 10*3/uL (ref 0.1–1.0)
Monocytes Relative: 9 %
NEUTROS ABS: 3.7 10*3/uL (ref 1.7–7.7)
NRBC: 0 % (ref 0.0–0.2)
Neutrophils Relative %: 61 %
PLATELETS: 172 10*3/uL (ref 150–400)
RBC: 4.3 MIL/uL (ref 4.22–5.81)
RDW: 13.5 % (ref 11.5–15.5)
WBC: 6 10*3/uL (ref 4.0–10.5)

## 2017-11-05 LAB — BASIC METABOLIC PANEL
Anion gap: 7 (ref 5–15)
BUN: 27 mg/dL — AB (ref 8–23)
CALCIUM: 9.7 mg/dL (ref 8.9–10.3)
CO2: 28 mmol/L (ref 22–32)
CREATININE: 1.42 mg/dL — AB (ref 0.61–1.24)
Chloride: 108 mmol/L (ref 98–111)
GFR, EST AFRICAN AMERICAN: 53 mL/min — AB (ref >60)
GFR, EST NON AFRICAN AMERICAN: 46 mL/min — AB (ref >60)
Glucose, Bld: 136 mg/dL — ABNORMAL HIGH (ref 70–99)
Potassium: 4.5 mmol/L (ref 3.5–5.1)
Sodium: 143 mmol/L (ref 135–145)

## 2017-11-05 LAB — POCT GLYCOSYLATED HEMOGLOBIN (HGB A1C)
HEMOGLOBIN A1C: 7.1 % (ref 4.0–5.6)
HbA1c, POC (controlled diabetic range): 7.1 % — AB (ref 0.0–7.0)
HbA1c, POC (prediabetic range): 7.1 % — AB (ref 5.7–6.4)
Hemoglobin A1C: 7.1 % — AB (ref 4.0–5.6)

## 2017-11-05 LAB — TSH: TSH: 4.419 u[IU]/mL (ref 0.350–4.500)

## 2017-11-05 LAB — TROPONIN I: Troponin I: <0.03 ng/mL (ref <0.03)

## 2017-11-05 MED ORDER — DICLOFENAC SODIUM 1 % TD GEL: 2.0000 g | Freq: Four times a day (QID) | TRANSDERMAL | 3 refills | Status: DC

## 2017-11-05 NOTE — Assessment & Plan Note (Signed)
Foot exam by MD today; checked A1c here and it was 7.1; encouraged healthy eating, eye exams

## 2017-11-05 NOTE — Telephone Encounter (Addendum)
Patient in the ED and Dr. Rockey Situ would like to have patient stop the aspirin, start Eliquis 5 mg twice a day, and to arrange a 2 week Zio monitor to be placed. Dr. Rockey Situ also wants patient to have echocardiogram and follow up appointment with next available provider or APP to review all of this information. Will try to reach him again later.

## 2017-11-05 NOTE — Patient Instructions (Addendum)
Go now to the emergency room Try to not eat more than 3 egg yolks per week Check out the information at familydoctor.org entitled "Nutrition for Weight Loss: What You Need to Know about Fad Diets" Try to lose between 1-2 pounds per week by taking in fewer calories and burning off more calories You can succeed by limiting portions, limiting foods dense in calories and fat, becoming more active, and drinking 8 glasses of water a day (64 ounces) Don't skip meals, especially breakfast, as skipping meals may alter your metabolism Do not use over-the-counter weight loss pills or gimmicks that claim rapid weight loss A healthy BMI (or body mass index) is between 18.5 and 24.9 You can calculate your ideal BMI at the [employer] website ClubMonetize.fr Try to limit saturated fats in your diet (bologna, hot dogs, barbeque, cheeseburgers, hamburgers, steak, bacon, sausage, cheese, etc.) and get more fresh fruits, vegetables, and whole grains If you need something for aches or pains, try to use Tylenol (acetaminophen) instead of non-steroidals (which include Aleve, ibuprofen, Advil, Motrin, and naproxen); non-steroidals can cause long-term kidney damage

## 2017-11-05 NOTE — Progress Notes (Signed)
BP 128/76   Pulse 99   Temp 97.9 F (36.6 C) (Oral)   Ht 5\' 8"  (1.727 m)   Wt 196 lb 6.4 oz (89.1 kg)   SpO2 97%   BMI 29.86 kg/m    Subjective:    Patient ID: JOVONTAE BANKO, male    DOB: November 09, 1940, 77 y.o.   MRN: [id number]  HPI: MALEKE FERIA is a 77 y.o. male  Chief Complaint  Patient presents with  . Foot Pain    right on top of foot gabapentin did not work made him dizzy  . Diabetes    HPI Patient came in for a Medicare wellness visit, but the doctor changed it to a problem based visit since he has uncontrolled diabetes  Type 2 diabetes melliutus; today's A1c is 7.1 150 this morning, yesterday 136; 160 the morning before; none over 200 unless after eating; just started back checking sugars; 165 highest recently; no excessive dry mouth; no blurred vision  Foot pain on the dorsa of the feet; he came in more than 6 months ago; was started on gabapentin, but after a few weeks it was too strong so he stopped it; patient saw a foot doctor, had xrays done He does not have neuropathy; getting progressively worse; when he has an outbreak of pain, lasts 2-3 days and then is gone; he'll be walking normal and it will hit him; walks abnormally when hurinting Uses good supportive shoes and gel inserts He bruised the right toes, just from the walking, bruised the toes; no fracture, nothing hurting, just bruised X-ray foot right 3 plus views 11/23/2016 [place] Other Result Information  This result has an attachment that is not available.  Result Narrative  3 views of the right foot reveals moderate midtarsal joint osteoarthritic  changes at Lisfranc joint as well as the naviculocuneiform joint and  talonavicular joint.Periarticular spurring with loss of joint space  diffusely throughout the midtarsal midfoot area is noted.No signs of  acute fracture.Metatarsophalangeal joints and subtalar joints are  well-maintained.   He sees his cardiologist  yearly after having had an aortic valve replacement in 2015; he has non-obstructive CAD by cath in 2015; he denies chest pain  CKD stage 3; avoiding NSAIDs  High cholesterol; taking rosuvastatin and also fish oil; does eat eggs, mornings  BPH; he has not seen anyone for his prostate; he is interested in checking PSA after discussion  Upon listening to him, he denies any palpitations; not aware of any abnormal heart beats; no chest pain; no SHOB; no hx of atrial fibrillation  Depression screen [facility] 2/9 11/05/2017 01/11/2017 06/29/2016 03/29/2016 09/26/2015  Decreased Interest 0 0 0 0 0  Down, Depressed, Hopeless 0 0 1 1 0  PHQ - 2 Score 0 0 1 1 0  Altered sleeping 0 - - - -  Tired, decreased energy 0 - - - -  Change in appetite 0 - - - -  Feeling bad or failure about yourself  0 - - - -  Trouble concentrating 0 - - - -  Moving slowly or fidgety/restless 0 - - - -  Suicidal thoughts 0 - - - -  PHQ-9 Score 0 - - - -  Difficult doing work/chores Not difficult at all - - - -   Fall Risk  11/05/2017 01/11/2017 06/29/2016 03/29/2016 09/26/2015  Falls in the past year? No No No No No    Relevant past medical, surgical, family and social history reviewed Past  Medical History:  Diagnosis Date  . Aortic stenosis    a. 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 Glower MD @ [facility] via R thoracic approach.  . CKD (chronic kidney disease) stage 3, GFR 30-59 ml/min (HCC) 06/17/2014  . Diabetes mellitus without complication ([place])   . Hypertension   . Non-obstructive CAD (coronary artery disease)    a. 12/2013 Cath ([facility]): LAD 30p, RI 60, otw nl (per CT surgery [facility] from [facility]).  Marland Kitchen PSVT (paroxysmal supraventricular tachycardia) ([place])    a. 05/2014 s/p RFCA @ [facility].  Marland Kitchen Urinary incontinence    Past Surgical History:  Procedure Laterality Date  . AORTIC VALVE REPLACEMENT    . CARDIAC CATHETERIZATION    . heart valve replacment     Family History  Problem Relation Age of  Onset  . Cancer Mother   . Stroke Father    Social History   Tobacco Use  . Smoking status: Former Research scientist (life sciences)  . Smokeless tobacco: Never Used  . Tobacco comment: over 40 yrs ago  Substance Use Topics  . Alcohol use: No    Alcohol/week: 0.0 standard drinks  . Drug use: No     Office Visit from 11/05/2017 in [facility]  AUDIT-C Score  0      Interim medical history since last visit reviewed. Allergies and medications reviewed  Review of Systems  Respiratory: Negative for shortness of breath.   Cardiovascular: Negative for chest pain and palpitations.   Per HPI unless specifically indicated above     Objective:    BP 128/76   Pulse 99   Temp 97.9 F (36.6 C) (Oral)   Ht 5\' 8"  (1.727 m)   Wt 196 lb 6.4 oz (89.1 kg)   SpO2 97%   BMI 29.86 kg/m   Wt Readings from Last 3 Encounters:  11/05/17 196 lb 6.4 oz (89.1 kg)  04/08/17 195 lb 9.6 oz (88.7 kg)  03/29/17 195 lb (88.5 kg)    Physical Exam  Constitutional: He appears well-developed and well-nourished. No distress.  HENT:  Head: Normocephalic and atraumatic.  Eyes: EOM are normal. No scleral icterus.  Neck: No thyromegaly present.  Cardiovascular: Normal rate. An irregularly irregular rhythm present.  Pulses:      Dorsalis pedis pulses are 1+ on the right side, and 1+ on the left side.  Pulmonary/Chest: Effort normal and breath sounds normal.  Abdominal: Soft. Bowel sounds are normal. He exhibits no distension.  Musculoskeletal: He exhibits no edema.       Right foot: There is no deformity.       Left foot: There is no deformity.  Feet:  Right Foot:  Protective Sensation: 5 sites tested. 5 sites sensed.  Skin Integrity: Negative for ulcer, blister or erythema.  Left Foot:  Protective Sensation: 5 sites tested. 5 sites sensed.  Skin Integrity: Negative for ulcer, blister or erythema.  Neurological: Coordination normal.  Skin: Skin is warm and dry. No pallor.  Psychiatric: He has a normal  mood and affect. His behavior is normal. Judgment and thought content normal.   Diabetic Foot Form - Detailed   Diabetic Foot Exam - detailed Diabetic Foot exam was performed with the following findings:  Yes 11/05/2017 11:20 AM  Visual Foot Exam completed.:  Yes  Pulse Foot Exam completed.:  Yes  Right Dorsalis Pedis:  Present Left Dorsalis Pedis:  Present  Sensory Foot Exam Completed.:  Yes Semmes-Weinstein Monofilament Test R Site 1-Great Toe:  Pos L Site 1-Great Toe:  Pos        Results for orders placed or performed in visit on 11/05/17  POCT HgB A1C  Result Value Ref Range   Hemoglobin A1C 7.1 (A) 4.0 - 5.6 %   HbA1c POC (<> result, manual entry) 7.1 4.0 - 5.6 %   HbA1c, POC (prediabetic range) 7.1 (A) 5.7 - 6.4 %   HbA1c, POC (controlled diabetic range) 7.1 (A) 0.0 - 7.0 %      Assessment & Plan:   Problem List Items Addressed This Visit      Cardiovascular and Mediastinum   Hypertension goal BP (blood pressure) < 140/90    well-controlled; avoiding salt      Aortic heart valve narrowing    Managed by cardiologist        Endocrine   Uncontrolled type 2 diabetes mellitus with diabetic nephropathy ([place])    Foot exam by MD today; checked A1c here and it was 7.1; encouraged healthy eating, eye exams      Relevant Orders   POCT HgB A1C (Completed)   Microalbumin / creatinine urine ratio   Lipid panel   Hemoglobin A1c   RESOLVED: Diabetes mellitus type 2, uncomplicated ([place]) - Primary   Relevant Orders   Ambulatory referral to Ophthalmology   POCT HgB A1C (Completed)     Musculoskeletal and Integument   Arthritis of both feet    Several joints involved per xray done Oct 2018; will start with tylenol and topical diclofenac; wamrth may help; supportive shoes; call me if not satisfied        Genitourinary   CKD (chronic kidney disease) stage 3, GFR 30-59 ml/min (HCC)    Avoid ORAL NSAIDs and just use topical NSAID, tylenol per package directions, turmeric,  topical warmth      Relevant Orders   COMPLETE METABOLIC PANEL WITH GFR   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    Patient opted to have PSA checked after discussion about screening stopping at age 27 versus monitoring ongoing symptoms; check PSA      Relevant Orders   PSA     Other   S/P AVR (aortic valve replacement)    Follow-up with cardiologist      Overweight (BMI 25.0-29.9)    Try to lose about 10 pounds over the upcoming 3-4 months      Low serum HDL    Check lipids today; limit saturated fats      Encounter for medication monitoring    Check liver and kidneys      Relevant Orders   COMPLETE METABOLIC PANEL WITH GFR    Other Visit Diagnoses    Irregular cardiac rhythm       EKG abnormal, BBB, reviewed with Dr. Rockey Situ; they recommend sending to ER by EMS; pt declined EMS, will go, checkout given   Relevant Orders   EKG 12-Lead   TSH   Magnesium      Patient is taking his tubes of blood with him to the ER; told Larene [place] in ER, they can use those tubes to run their labs  Follow up plan: Return in about 3 months (around 02/05/2018) for Medicare Wellness check with LPN; 3 months with Dr. Sanda Klein.  An after-visit summary was printed and given to the patient at [facility].  Please see the patient instructions which may contain other information and recommendations beyond what is mentioned above in the assessment and plan.  Meds ordered this encounter  Medications  .  diclofenac sodium (VOLTAREN) 1 % GEL    Sig: Apply 2 g topically 4 (four) times daily.    Dispense:  100 g    Refill:  3    Orders Placed This Encounter  Procedures  . Microalbumin / creatinine urine ratio  . Lipid panel  . Hemoglobin A1c  . COMPLETE METABOLIC PANEL WITH GFR  . PSA  . TSH  . Magnesium  . Ambulatory referral to Ophthalmology  . POCT HgB A1C  . EKG 12-Lead

## 2017-11-05 NOTE — Assessment & Plan Note (Signed)
Try to lose about 10 pounds over the upcoming 3-4 months

## 2017-11-05 NOTE — ED Notes (Signed)
Pt denies any pain. Pt currently does not appear in any distress, joking about his morning and events that led pt to come to the ED.

## 2017-11-05 NOTE — Assessment & Plan Note (Signed)
well-controlled; avoiding salt

## 2017-11-05 NOTE — Assessment & Plan Note (Signed)
Managed by cardiologist 

## 2017-11-05 NOTE — Assessment & Plan Note (Signed)
Follow up with cardiologist

## 2017-11-05 NOTE — ED Notes (Signed)
RN notified by registration that pt was unhooking himself and states he is leaving. This RN spoke to pt and convinced him to wait a few more minutes for MD to speak to pt about results.

## 2017-11-05 NOTE — ED Triage Notes (Signed)
Pt reports that he was at [facility] for a wellness check. He reports that they listened to his heart and did an EKG, drew blood and told him that they wanted to call EMS to come to the ER. He reports that he feels fine and does not know why they sent him here. Pt has blood work in a bag they sent him with.

## 2017-11-05 NOTE — Telephone Encounter (Signed)
Received EKG from Dr. Delight Ovens office and had Dr. 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 Reviewed this information with Dr. Sanda Klein and they will have patient go to ED for further care.

## 2017-11-05 NOTE — Discharge Instructions (Addendum)
Dr. Rockey Situ, your cardiologist, will give you a call and discuss what we will do next.  Your EKG may show atrial fibrillation or atrial flutter is difficult to tell at this point.  He may want to start you on a blood thinner in which case she have to stop the aspirin.  He will call you and discuss things with you after he has had a chance to look at Dr. Ulyses Amor EKG as well as study the one we have done here more carefully.

## 2017-11-05 NOTE — Assessment & Plan Note (Signed)
Patient opted to have PSA checked after discussion about screening stopping at age 77 versus monitoring ongoing symptoms; check PSA

## 2017-11-05 NOTE — Assessment & Plan Note (Signed)
Avoid ORAL NSAIDs and just use topical NSAID, tylenol per package directions, turmeric, topical warmth

## 2017-11-05 NOTE — ED Notes (Signed)
RN confronted pt in the lobby and reported we can not run the blood work that he brought with him today. Pt reports he does not want other blood work drawn today until he returns the blood back to the office. Pt insisting he can drive to the office to drop off the blood for testing and then return to the ED. RN verbalized the office will send him back to the ED again but pt insists he will not have his blood drawn until returning the blood "I was already charged for"   Pt in NAD at time of leaving the ED lobby.

## 2017-11-05 NOTE — Telephone Encounter (Signed)
Dr. Delight Ovens office calling Dr. Sanda Klein is requesting to speak with Dr. Rockey Situ directly about patient in office Patient is in a-fib and has an abnormal EKG Please call ASAP at [phone]

## 2017-11-05 NOTE — Assessment & Plan Note (Signed)
Check liver and kidneys 

## 2017-11-05 NOTE — ED Notes (Signed)
RN spoke

## 2017-11-05 NOTE — Assessment & Plan Note (Signed)
Check lipids today; limit saturated fats 

## 2017-11-05 NOTE — Assessment & Plan Note (Signed)
Several joints involved per xray done Oct 2018; will start with tylenol and topical diclofenac; wamrth may help; supportive shoes; call me if not satisfied

## 2017-11-05 NOTE — ED Provider Notes (Signed)
[facility] Emergency Department Provider Note   ____________________________________________   First MD Initiated Contact with Patient 11/05/17 1401     (approximate)  I have reviewed the triage vital signs and the nursing notes.   HISTORY  Chief Complaint Abnormal Lab    HPI Timothy Riley is a 77 y.o. male patient sent to the emergency room for new onset A. fib flutter.  He has no chest pain shortness of breath or any other complaints.  He has had minimal ankle swelling for several months.  He has had right bundle branch block on previous EKG earlier this year.  Review of his old records show that he had A. fib diagnosed in 2016.  Past Medical History:  Diagnosis Date  . Aortic stenosis    a. 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 Glower MD @ [facility] via R thoracic approach.  . CKD (chronic kidney disease) stage 3, GFR 30-59 ml/min (HCC) 06/17/2014  . Diabetes mellitus without complication ([place])   . Hypertension   . Non-obstructive CAD (coronary artery disease)    a. 12/2013 Cath ([facility]): LAD 30p, RI 60, otw nl (per CT surgery note from [facility]).  Marland Kitchen PSVT (paroxysmal supraventricular tachycardia) ([place])    a. 05/2014 s/p RFCA @ [facility].  Marland Kitchen Urinary incontinence     Patient Active Problem List   Diagnosis Date Noted  . Uncontrolled type 2 diabetes mellitus with diabetic nephropathy ([place]) 11/05/2017  . Arthritis of both feet 11/05/2017  . Overweight (BMI 25.0-29.9) 11/05/2017  . Hyperlipidemia 03/29/2017  . Hypertriglyceridemia 10/26/2015  . Low serum HDL 10/26/2015  . Encounter for medication monitoring 09/26/2015  . Medicare annual wellness visit, subsequent 03/29/2015  . H/O colonoscopy with polypectomy 08/27/2014  . S/P AVR (aortic valve replacement) 08/24/2014  . 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  . Hypertension goal BP (blood pressure) < 140/90 08/24/2014  . Need for pneumococcal  vaccination 08/24/2014  . CKD (chronic kidney disease) stage 3, GFR 30-59 ml/min (HCC) 06/17/2014  . H/O paroxysmal supraventricular tachycardia 05/31/2014  . Awareness of heartbeats 03/31/2014  . 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 Arteriosclerosis of coronary artery 01/11/2014  . Aortic heart valve narrowing 12/31/2013  . 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 Combined fat and carbohydrate induced hyperlipemia 12/01/2013    Past Surgical History:  Procedure Laterality Date  . AORTIC VALVE REPLACEMENT    . CARDIAC CATHETERIZATION    . heart valve replacment      Prior to Admission medications   Medication Sig Start Date End Date Taking? Authorizing Provider  amLODipine (NORVASC) 10 MG tablet TAKE 1 TABLET BY MOUTH ONCE DAILY 09/26/17   Poulose, Bethel Born, NP  aspirin 81 MG tablet Take 81 mg by mouth daily. At bed time.    [provider]  diclofenac sodium (VOLTAREN) 1 % GEL Apply 2 g topically 4 (four) times daily. 11/05/17   Lada, Satira Anis, MD  doxazosin (CARDURA) 8 MG tablet TAKE 1 TABLET BY MOUTH ONCE A DAY 08/14/17   Gollan, Kathlene November, MD  glipiZIDE (GLUCOTROL) 10 MG tablet TAKE 1 TABLET BY MOUTH TWICE A DAY BEFORE A MEAL. 07/15/17   Arnetha Courser, MD  lisinopril (PRINIVIL,ZESTRIL) 40 MG tablet TAKE 1 TABLET BY MOUTH ONCE A DAY 08/14/17   Minna Merritts, MD  metoprolol succinate (TOPROL-XL) 50 MG 24  hr tablet Take 1 tablet (50 mg total) by mouth daily. 02/28/17   Arnetha Courser, MD  Omega-3 Krill Oil 500 MG CAPS One by mouth twice a day 06/30/16   Arnetha Courser, MD  pioglitazone (ACTOS) 30 MG tablet TAKE 1 TABLET BY MOUTH ONCE DAILY 08/30/17   Arnetha Courser, MD  rosuvastatin (CRESTOR) 10 MG tablet TAKE 1 TABLET BY MOUTH ONCE DAILY 10/28/17   Poulose, Bethel Born, NP    Allergies Gabapentin and Hydralazine hcl  Family History  Problem Relation  Age of Onset  . Cancer Mother   . Stroke Father     Social History Social History   Tobacco Use  . Smoking status: Former Research scientist (life sciences)  . Smokeless tobacco: Never Used  . Tobacco comment: over 40 yrs ago  Substance Use Topics  . Alcohol use: No    Alcohol/week: 0.0 standard drinks  . Drug use: No    Review of Systems  Constitutional: No fever/chills Eyes: No visual changes. ENT: No sore throat. Cardiovascular: Denies chest pain. Respiratory: Denies shortness of breath. Gastrointestinal: No abdominal pain.  No nausea, no vomiting.  No diarrhea.  No constipation. Genitourinary: Negative for dysuria. Musculoskeletal: Negative for back pain. Skin: Negative for rash. Neurological: Negative for headaches, focal weakness   ____________________________________________   PHYSICAL EXAM:  VITAL SIGNS: ED Triage Vitals  Enc Vitals Group     BP 11/05/17 1232 116/81     Pulse Rate 11/05/17 1232 95     Resp 11/05/17 1500 18     Temp 11/05/17 1232 97.8 F (36.6 C)     Temp Source 11/05/17 1232 Oral     SpO2 11/05/17 1232 100 %     Weight 11/05/17 1233 196 lb 6.4 oz (89.1 kg)     Height 11/05/17 1233 5\' 8"  (1.727 m)     Head Circumference --      Peak Flow --      Pain Score 11/05/17 1233 0     Pain Loc --      Pain Edu? --      Excl. in [place]? --     Constitutional: Alert and oriented. Well appearing and in no acute distress. Eyes: Conjunctivae are normal. PERRL. EOMI. Head: Atraumatic. Nose: No congestion/rhinnorhea. Mouth/Throat: Mucous membranes are moist.  Oropharynx non-erythematous. Neck: No stridor.  rmal heart sounds.  Good peripheral circulation. Respiratory: Normal respiratory effort.  No retractions. Lungs CTAB. Gastrointestinal: Soft and nontender. No distention. No abdominal bruits. No CVA tenderness. Musculoskeletal: No lower extremity tenderness nor edema.  No joint effusions. Neurologic:  Normal speech and language. No gross focal neurologic deficits are  appreciated. No gait instability. Skin:  Skin is warm, dry and intact. No rash noted. Psychiatric: Mood and affect are normal. Speech and behavior are normal.  ____________________________________________   LABS (all labs ordered are listed, but only abnormal results are displayed)  Labs Reviewed  CBC WITH DIFFERENTIAL/PLATELET - Abnormal; Notable for the following components:      Result Value   HCT 38.2 (*)    All other components within normal limits  BASIC METABOLIC PANEL - Abnormal; Notable for the following components:   Glucose, Bld 136 (*)    BUN 27 (*)    Creatinine, Ser 1.42 (*)    GFR calc non Af Amer 46 (*)    GFR calc Af Amer 53 (*)    All other components within normal limits  TROPONIN I  TSH   ____________________________________________  EKG EKG read  interpreted by me shows either A. fib or a flutter are slightly difficult to tell at a rate of 90 possible slight left axis right bundle branch block.  Right bundle branch block is old patient was diagnosed with A. fib back in 2016 although his last EKG does not show atrial fibrillation severe sinus rhythm. ____________________________________________  RADIOLOGY  ED MD interpretation: Chest x-ray read by radiology reviewed by me shows no acute disease  Official radiology report(s): Dg Chest Portable 1 View  Result Date: 11/05/2017 CLINICAL DATA:  New onset atrial fibrillation EXAM: PORTABLE CHEST 1 VIEW COMPARISON:  05/28/2014 FINDINGS: Right anterior thoracotomy wires again noted. Upper wire fracture. Surgical clips right hilum. History aortic valve replacement. Heart size upper normal. Negative for heart failure. Lungs are clear of infiltrate or effusion IMPRESSION: No active disease. Electronically Signed   By: Franchot Gallo M.D.   On: 11/05/2017 15:55    ____________________________________________   PROCEDURES  Procedure(s) performed: Reviewed in detail with Dr. Rockey Situ.  He will follow the patient up  quickly.  He will call him shortly and discuss what to do next with the patient.  Procedures  Critical Care performed:   ____________________________________________   INITIAL IMPRESSION / ASSESSMENT AND PLAN / ED COURSE          ____________________________________________   FINAL CLINICAL IMPRESSION(S) / ED DIAGNOSES  Final diagnoses:  Normal physical exam     ED Discharge Orders    None       Note:  This document was prepared using Dragon voice recognition software and may include unintentional dictation errors.    Nena Polio, MD 11/05/17 [phone]

## 2017-11-06 LAB — LIPID PANEL
CHOL/HDL RATIO: 4.3 (calc) (ref <5.0)
Cholesterol: 120 mg/dL (ref <200)
HDL: 28 mg/dL — ABNORMAL LOW (ref >40)
LDL CHOLESTEROL (CALC): 65 mg/dL
NON-HDL CHOLESTEROL (CALC): 92 mg/dL (ref <130)
Triglycerides: 207 mg/dL — ABNORMAL HIGH (ref <150)

## 2017-11-06 LAB — MAGNESIUM: Magnesium: 2 mg/dL (ref 1.5–2.5)

## 2017-11-06 LAB — COMPLETE METABOLIC PANEL WITH GFR
AG RATIO: 2 (calc) (ref 1.0–2.5)
ALT: 16 U/L (ref 9–46)
AST: 23 U/L (ref 10–35)
Albumin: 4.3 g/dL (ref 3.6–5.1)
Alkaline phosphatase (APISO): 69 U/L (ref 40–115)
BILIRUBIN TOTAL: 0.7 mg/dL (ref 0.2–1.2)
BUN/Creatinine Ratio: 18 (calc) (ref 6–22)
BUN: 25 mg/dL (ref 7–25)
CALCIUM: 9.3 mg/dL (ref 8.6–10.3)
CHLORIDE: 105 mmol/L (ref 98–110)
CO2: 26 mmol/L (ref 20–32)
Creat: 1.41 mg/dL — ABNORMAL HIGH (ref 0.70–1.18)
GFR, EST AFRICAN AMERICAN: 55 mL/min/{1.73_m2} — AB (ref >=60)
GFR, EST NON AFRICAN AMERICAN: 47 mL/min/{1.73_m2} — AB (ref >=60)
GLOBULIN: 2.1 g/dL (ref 1.9–3.7)
Glucose, Bld: 197 mg/dL — ABNORMAL HIGH (ref 65–139)
POTASSIUM: 4 mmol/L (ref 3.5–5.3)
SODIUM: 140 mmol/L (ref 135–146)
Total Protein: 6.4 g/dL (ref 6.1–8.1)

## 2017-11-06 LAB — PSA: PSA: 1.1 ng/mL (ref <=4.0)

## 2017-11-06 LAB — TSH: TSH: 3.42 mIU/L (ref 0.40–4.50)

## 2017-11-06 MED ORDER — APIXABAN 5 MG PO TABS: 5.0000 mg | ORAL_TABLET | Freq: Two times a day (BID) | ORAL | 4 refills | Status: DC

## 2017-11-06 NOTE — Telephone Encounter (Signed)
Spoke with patient. He verbalized understanding to stop aspirin, start eliquis 5 mg two times a day, wear ZIO for 2 weeks and have echo. He's already got an appointment scheduled with Dr Rockey Situ on 12/16/17. Transferred to scheduler to arrange echo and ZIO.

## 2017-11-06 NOTE — Telephone Encounter (Signed)
Patient returning call  Scheduled a follow up appointment for Dr. Rockey Situ for 11/18 Medication will need to be sent to [facility] he will be with his wife at [facility] best to call [phone] from 1p to 1:30p

## 2017-11-06 NOTE — Telephone Encounter (Signed)
No answer. No voicemail. [facility] number listed and no answer. Will try again later.

## 2017-11-07 NOTE — Telephone Encounter (Signed)
Patient aware of plan of care. Has upcoming echo and ZIO placement appointment with f/u with Dr Rockey Situ in November.

## 2017-11-07 NOTE — Telephone Encounter (Signed)
New atrial fibrillation on EKG, seen in the office and emergency room Timing unclear Aspirin held and he was started on anticoagulation in the emergency room Echo pending with follow-up in cardiology clinic No signs of active ischemia

## 2017-11-13 ENCOUNTER — Ambulatory Visit (INDEPENDENT_AMBULATORY_CARE_PROVIDER_SITE_OTHER): Payer: Medicare Other

## 2017-11-13 ENCOUNTER — Other Ambulatory Visit: Payer: Self-pay

## 2017-11-13 DIAGNOSIS — I4891 Unspecified atrial fibrillation: Secondary | ICD-10-CM | POA: Diagnosis not present

## 2017-11-13 NOTE — Telephone Encounter (Signed)
Patient in office for ECHO and heart monitor Pt c/o medication issue:  1. Name of Medication: Eliquis   2. How are you currently taking this medication (dosage and times per day)? 5 MG two times daily  3. Are you having a reaction (difficulty breathing--STAT)? no  4. What is your medication issue? Patient would like to discuss whether not it is ok to take Eliquis with a heart valve.  Please discuss.

## 2017-11-13 NOTE — Telephone Encounter (Signed)
The patient was concerned about taking Eliquis with his heart valve. His stated that this question had been resolved already and that it was his understanding that he could take the Eliquis with his pig valve but not if he had a mechanical valve.

## 2017-11-18 ENCOUNTER — Encounter: Payer: Self-pay | Admitting: Nurse Practitioner

## 2017-11-18 ENCOUNTER — Ambulatory Visit: Payer: Medicare Other | Admitting: Nurse Practitioner

## 2017-11-18 ENCOUNTER — Ambulatory Visit: Payer: Self-pay | Admitting: *Deleted

## 2017-11-18 ENCOUNTER — Telehealth: Payer: Self-pay | Admitting: Cardiovascular Disease

## 2017-11-18 VITALS — BP 130/78 | HR 90 | Temp 98.2°F | Resp 16 | Ht 68.0 in | Wt 197.0 lb

## 2017-11-18 DIAGNOSIS — R748 Abnormal levels of other serum enzymes: Secondary | ICD-10-CM | POA: Diagnosis not present

## 2017-11-18 DIAGNOSIS — K4091 Unilateral inguinal hernia, without obstruction or gangrene, recurrent: Secondary | ICD-10-CM | POA: Diagnosis not present

## 2017-11-18 NOTE — Progress Notes (Signed)
Name: Timothy Riley   MRN: [id number]    DOB: Feb 13, 1940   Date:11/18/2017       Progress Note  Subjective  Chief Complaint  Chief Complaint  Patient presents with  . Groin Swelling    right    HPI  Patient notes swelling of a soft lump above his right groin area that's been there for a month or two. He has had so much going on with my wife that I've just discounted it for the last month or so. He helps care for her notes lots of bending and lifting her. States had this a few years ago after injury and self-reduced.   Patient Active Problem List   Diagnosis Date Noted  . Uncontrolled type 2 diabetes mellitus with diabetic nephropathy ([place]) 11/05/2017  . Arthritis of both feet 11/05/2017  . Overweight (BMI 25.0-29.9) 11/05/2017  . Hyperlipidemia 03/29/2017  . Hypertriglyceridemia 10/26/2015  . Low serum HDL 10/26/2015  . Encounter for medication monitoring 09/26/2015  . Medicare annual wellness visit, subsequent 03/29/2015  . H/O colonoscopy with polypectomy 08/27/2014  . S/P AVR (aortic valve replacement) 08/24/2014  . 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  . Hypertension goal BP (blood pressure) < 140/90 08/24/2014  . Need for pneumococcal vaccination 08/24/2014  . CKD (chronic kidney disease) stage 3, GFR 30-59 ml/min (HCC) 06/17/2014  . H/O paroxysmal supraventricular tachycardia 05/31/2014  . Awareness of heartbeats 03/31/2014  . 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 Arteriosclerosis of coronary artery 01/11/2014  . Aortic heart valve narrowing 12/31/2013  . 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 Combined fat and carbohydrate induced hyperlipemia 12/01/2013    Past Medical History:  Diagnosis Date  . Aortic stenosis    a. 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 Glower MD @ [facility] via R thoracic  approach.  . CKD (chronic kidney disease) stage 3, GFR 30-59 ml/min (HCC) 06/17/2014  . Diabetes mellitus without complication ([place])   . Hypertension   . Non-obstructive CAD (coronary artery disease)    a. 12/2013 Cath ([facility]): LAD 30p, RI 60, otw nl (per CT surgery note from [facility]  Marland Kitchen PSVT (paroxysmal supraventricular tachycardia) ([place])    a. 05/2014 s/p RFCA @ [facility].  Marland Kitchen Urinary incontinence     Past Surgical History:  Procedure Laterality Date  . AORTIC VALVE REPLACEMENT    . CARDIAC CATHETERIZATION    . heart valve replacment      Social History   Tobacco Use  . Smoking status: Former Research scientist (life sciences)  . Smokeless tobacco: Never Used  . Tobacco comment: over 40 yrs ago  Substance Use Topics  . Alcohol use: No    Alcohol/week: 0.0 standard drinks     Current Outpatient Medications:  .  amLODipine (NORVASC) 10 MG tablet, TAKE 1 TABLET BY MOUTH ONCE DAILY, Disp: 90 tablet, Rfl: 0 .  apixaban (ELIQUIS) 5 MG TABS tablet, Take 1 tablet (5 mg total) by mouth 2 (two) times daily., Disp: 60 tablet, Rfl: 4 .  diclofenac sodium (VOLTAREN) 1 % GEL, Apply 2 g topically 4 (four) times daily., Disp: 100 g, Rfl: 3 .  doxazosin (CARDURA) 8 MG tablet, TAKE 1 TABLET BY MOUTH ONCE A DAY, Disp: 30 tablet, Rfl: 3 .  glipiZIDE (GLUCOTROL) 10 MG tablet, TAKE 1 TABLET BY MOUTH TWICE A DAY BEFORE A MEAL., Disp: 60 tablet, Rfl: 5 .  lisinopril (PRINIVIL,ZESTRIL) 40 MG tablet, TAKE 1 TABLET BY MOUTH ONCE A DAY, Disp: 90 tablet, Rfl: 3 .  metoprolol succinate (TOPROL-XL) 50 MG 24 hr tablet, Take 1 tablet (50 mg total) by mouth daily., Disp: 90 tablet, Rfl: 3 .  Omega-3 Krill Oil 500 MG CAPS, One by mouth twice a day, Disp: , Rfl:  .  pioglitazone (ACTOS) 30 MG tablet, TAKE 1 TABLET BY MOUTH ONCE DAILY, Disp: 30 tablet, Rfl: 6 .  rosuvastatin (CRESTOR) 10 MG tablet, TAKE 1 TABLET BY MOUTH ONCE DAILY, Disp: 90 tablet, Rfl: 0  Allergies  Allergen Reactions  . Gabapentin     dizzy  . Hydralazine Hcl Rash     ROS   No other specific complaints in a complete review of systems (except as listed in HPI above).  Objective  Vitals:   11/18/17 1028 11/18/17 1112  BP: 130/78   Pulse: (!) 106 90  Resp: 16 16  Temp: 98.2 F (36.8 C)   TempSrc: Oral   SpO2: 98%   Weight: 197 lb (89.4 kg)   Height: 5\' 8"  (1.727 m)     Body mass index is 29.95 kg/m.  Nursing Note and Vital Signs reviewed.  Physical Exam  Constitutional: He is oriented to person, place, and time. He appears well-developed and well-nourished. He is cooperative.  HENT:  Head: Normocephalic and atraumatic.  Right Ear: Hearing normal.  Left Ear: Hearing normal.  Mouth/Throat: Mucous membranes are normal.  Eyes: Conjunctivae are normal.  Cardiovascular: Normal rate, regular rhythm and normal heart sounds.  Pulmonary/Chest: Effort normal and breath sounds normal.  Abdominal: Normal appearance. A hernia is present. Hernia confirmed positive in the right inguinal area (non-tender, not reducible). Hernia confirmed negative in the left inguinal area.  Musculoskeletal: Normal range of motion.  Lymphadenopathy: No inguinal adenopathy noted on the right or left side.  Neurological: He is alert and oriented to person, place, and time.  Psychiatric: He has a normal mood and affect. His speech is normal and behavior is normal. Judgment and thought content normal.     No results found for this or any previous visit (from the past 48 hour(s)).  Assessment & Plan  1. Unilateral recurrent inguinal hernia without obstruction or gangrene Unable to reduce in office. Avoid lifting, bending. Discussed use of tress; patient preference to not wear.  - Ambulatory referral to General Surgery  2. Low serum HDL Discussed diet.

## 2017-11-18 NOTE — Telephone Encounter (Signed)
Called in c/o swelling of a soft lump above his right groin area that's been there for a month or two.  "I've had so much going on with my wife that I've just discounted it for the last month or so".   "I've been doing a lot of lifting of my wife who is a heavy woman into and out of a wheelchair and to many doctor appts".  "This may be from all that lifting, I don't know".  Denies pain in the area.  See triage notes.  I scheduled him with Suezanne Cheshire, NP for today at 10:40.  Reason for Disposition . [1] Scrotum swelling AND [2] no pain  Answer Assessment - Initial Assessment Questions 1. SCROTAL SWELLING: "What does the scrotum look like?" "How swollen is it?" (mild, moderate severe; compare to other side)     Having swelling in right scrotum for a month or two.   I've had a lot going on lately.   I've discounted it.     2. LOCATION: "Where is the swelling located?"     Right side above the groin area.   It's a lump.  It's soft.   It's bigger when I stand. 3. ONSET: "When did the swelling start?"     A  Month or two ago.   I also have a large stomach so it's hard to see. 4. PATTERN: "Does it come and go, or has it been constant since it started?"     It's there. 5. SCROTAL PAIN: "Is there any pain?" If so, ask: "How bad is it?"  (Scale 1-10; or mild, moderate, severe)     No pain. 6. HERNIA: "Has a doctor ever told you that you have a hernia?"     No     A couple of years ago I fell over a wheelchair at 2am.  On the right side I had a hernia.    It went away after they gave me some medication. 7. OTHER SYMPTOMS: "Do you have any other symptoms?" (e.g., fever, abdominal pain, vomiting, difficulty passing urine)     No urinary symptoms.  The last 4 months (I told Dr. Sanda Klein) she called in a cream for the top of my feet.    Maybe from this?  I don't know.  Protocols used: [facility]

## 2017-11-18 NOTE — Telephone Encounter (Signed)
Patient returning call.

## 2017-11-18 NOTE — Patient Instructions (Addendum)
Good cholesterol, also called high-density lipoprotein (HDL) removes extra cholesterol and plaque buildup in your arteries and then sends it to your liver to get rid of and helps reduce your risk of heart disease, heart attack, and stroke.Foods that increase HDL: beans and legumes, whole grains, high-fiber fruits:prunes, apples, and pears; fatty fish- salmon, tuna, sardines; nuts, olive oil    Inguinal Hernia, Adult An inguinal hernia is when fat or the intestines push through the area where the leg meets the lower belly (groin) and make a rounded lump (bulge). This condition happens over time. There are three types of inguinal hernias. These types include:  Hernias that can be pushed back into the belly (are reducible).  Hernias that cannot be pushed back into the belly (are incarcerated).  Hernias that cannot be pushed back into the belly and lose their blood supply (get strangulated). This type needs emergency surgery.  Follow these instructions at home: Lifestyle  Drink enough fluid to keep your urine (pee) clear or pale yellow.  Eat plenty of fruits, vegetables, and whole grains. These have a lot of fiber. Talk with your doctor if you have questions.  Avoid lifting heavy objects.  Avoid standing for long periods of time.  Do not use tobacco products. These include cigarettes, chewing tobacco, or e-cigarettes. If you need help quitting, ask your doctor.  Try to stay at a healthy weight. General instructions  Do not try to force the hernia back in.  Watch your hernia for any changes in color or size. Let your doctor know if there are any changes.  Take over-the-counter and prescription medicines only as told by your doctor.  Keep all follow-up visits as told by your doctor. This is important. Contact a doctor if:  You have a fever.  You have new symptoms.  Your symptoms get worse. Get help right away if:  The area where the legs meets the lower belly has: ? Pain that  gets worse suddenly. ? A bulge that gets bigger suddenly and does not go down. ? A bulge that turns red or purple. ? A bulge that is painful to the touch.  You are a man and your scrotum: ? Suddenly feels painful. ? Suddenly changes in size.  You feel sick to your stomach (nauseous) and this feeling does not go away.  You throw up (vomit) and this keeps happening.  You feel your heart beating a lot more quickly than normal.  You cannot poop (have a bowel movement) or pass gas. This information is not intended to replace advice given to you by your health care provider. Make sure you discuss any questions you have with your health care provider. Document Released: 02/15/2006 Document Revised: 06/23/2015 Document Reviewed: 11/25/2013 Elsevier Interactive Patient Education  2018 [employer].

## 2017-11-18 NOTE — Telephone Encounter (Signed)
I left a message for the patient to call at his home # for echo results. Attempted to contact him on his cell #- n/a & vm box is full.

## 2017-11-18 NOTE — Telephone Encounter (Signed)
Left a message for the patient to call back.  Notes recorded by Minna Merritts, MD on 11/16/2017 at 3:02 PM EDT Echo Shows mildly depressed cardiac function, Possibly a result of new atrial fib After one month on the eliquis, we can discuss ways of getting him back to NSR

## 2017-11-19 ENCOUNTER — Telehealth: Payer: Self-pay | Admitting: Family Medicine

## 2017-11-19 NOTE — Telephone Encounter (Signed)
Pt was confused, he will first need to get setup with surgeon with date of surgery then get cardiac clearance through cardio

## 2017-11-19 NOTE — Telephone Encounter (Signed)
Patient made aware of results and verbalized understanding.  Patient will be having a hernia repair done sometime soon. He would like to know when/if to stop the Eliquis. He has been advised to have the surgical office contact this office for cardiac clearance and then the Eliquis can be addressed at that time. Patient verbalized his understanding.

## 2017-11-19 NOTE — Telephone Encounter (Signed)
Copied from [place] [id number]. Topic: Quick Communication - See Telephone Encounter >> Nov 19, 2017 12:45 PM Sheran Luz wrote: CRM for notification. See Telephone encounter for: 11/19/17.  Pt called to state that he is currently on apixaban (ELIQUIS) 5 MG TABS tablet prescribed by cardiologist- Pt was advised to contact office to discuss when hernia repair will be done as he states he was told he needs to stop taking the eliquis before the procedure. Pt just wanted to make Dr. Sanda Klein aware.   OK to leave VM

## 2017-11-19 NOTE — Telephone Encounter (Signed)
See other phone note with Dr. Donivan Scull name on it They are going to discuss his Eliquis at his appointment with cardiologist Thank you

## 2017-11-20 ENCOUNTER — Telehealth: Payer: Self-pay | Admitting: Cardiovascular Disease

## 2017-11-20 NOTE — Telephone Encounter (Signed)
Patient called in stating that he has to have hernia repair surgery. He is currently wearing a Zio monitor that is scheduled to come off on 11/27/17.  The patient would like to know if enough information has been gathered that he can go ahead and take the monitor off before that date so that he can schedule the hernia repair surgery. He also would like to know how long he needs to hold Eliquis.  He has been informed that typically surgerys require a cardiac clearance and that the surgical center should send a cardiac clearance form. Holding the eliquis would then be addressed at that time. Message routed to the provider for information.

## 2017-11-20 NOTE — Telephone Encounter (Signed)
Left a message for the patient to call back.  

## 2017-11-20 NOTE — Telephone Encounter (Signed)
Patient calling to check on status of information  Please call to discuss

## 2017-11-20 NOTE — Telephone Encounter (Signed)
Lots to discuss concerning his atrial fib and surgery Would recommend he come into the office for a visit, I can go through everything Will take a bit of time Is he free in the afternoon on Thursday

## 2017-11-20 NOTE — Telephone Encounter (Signed)
Pt states he was diagnosed with a hernia and asks if he needs to wait until his monitor comes off before he can get clearance for his hernia surgery. Please call to advise.

## 2017-11-21 ENCOUNTER — Ambulatory Visit: Payer: Medicare Other | Admitting: Cardiovascular Disease

## 2017-11-21 ENCOUNTER — Encounter: Payer: Self-pay | Admitting: Cardiovascular Disease

## 2017-11-21 VITALS — BP 152/82 | HR 82 | Ht 68.0 in | Wt 199.0 lb

## 2017-11-21 DIAGNOSIS — E782 Mixed hyperlipidemia: Secondary | ICD-10-CM

## 2017-11-21 DIAGNOSIS — E118 Type 2 diabetes mellitus with unspecified complications: Secondary | ICD-10-CM | POA: Diagnosis not present

## 2017-11-21 DIAGNOSIS — I471 Supraventricular tachycardia: Secondary | ICD-10-CM | POA: Diagnosis not present

## 2017-11-21 DIAGNOSIS — I480 Paroxysmal atrial fibrillation: Secondary | ICD-10-CM | POA: Diagnosis not present

## 2017-11-21 DIAGNOSIS — Z952 Presence of prosthetic heart valve: Secondary | ICD-10-CM | POA: Diagnosis not present

## 2017-11-21 MED ORDER — FUROSEMIDE 20 MG PO TABS: 20.0000 mg | ORAL_TABLET | Freq: Every day | ORAL | 1 refills | Status: DC | PRN

## 2017-11-21 NOTE — Progress Notes (Signed)
Cardiology Office Note  Date:  11/21/2017   ID:  Timothy Riley, DOB 07/27/1940, MRN [id number]  PCP:  Arnetha Courser, MD   Chief Complaint  Patient presents with  . other    Echo 11/13/17. Medications verbally reviewed.    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, requiring ablation 06/30/2014 again at [facility],  diabetes type 2 with most recent hemoglobin A1c 7.3,  BPH,  normal ejection fraction  who presents for routine follow-up of his bioprosthetic valve, hypertension , atrial fibrillation  Seen by primary care, abnormal EKG at that time, sent to the emergency room Went to the emergency room November 05, 2017 Noted to have atrial fibrillation, new finding Reported some ankle swelling Aspirin was held Started on anticoagulation at that time, eliquis 5 BID  Diagnosed with hernia, needs surgery Had the hernia for several months Unable to reduce recently  Echocardiogram performed showing mildly depressed ejection fraction 45%  He reports that the swelling in the right groin hernia site does not bother him, nonpainful But would like to have this fixed  Atrial fibril patient is not bothering him, denies any tachycardia, palpitations, shortness of breath on exertion He does report some tenderness in the top part of his feet, foot swelling, ankle swelling over the past several months  No PND orthopnea Most of his time spent taking care of his wife Wife in [place], has debility, memory issues He has to lift her, move her in a wheelchair No regular exercise program  Lab work reviewed with him in detail HAB1C 7.3  EKG personally reviewed by myself on todays visit Shows atrial fibrillation with ventricular rate 82 bpm right bundle branch block  Previous echocardiogram reviewed with him in detail from   03/2016 The estimated ejection fraction was in the range of 55% to 65%.  Recent echocardiogram with decline in ejection fraction down to 45%, now in atrial fibrillation  Other past medical history He was hypotensive on a previous clinic visit, stopped his tamsulosin, chlorthalidone Reports having tried a statin in the past, felt jittery  Previous notes from outside cardiologist says he has mild coronary artery disease, details not available through care everywhere Followed by endocrine for his diabetes, hemoglobin A1c of >7  Prior episodes of SVT requiring adenosine He denies any significant CAD by prior catheterization.   PMH:   has a past medical history of Aortic stenosis, CKD (chronic kidney disease) stage 3, GFR 30-59 ml/min (HCC) (06/17/2014), Diabetes mellitus without complication ([place]), Hypertension, Non-obstructive CAD (coronary artery disease), PSVT (paroxysmal supraventricular tachycardia) ([place]), and Urinary incontinence.  PSH:    Past Surgical History:  Procedure Laterality Date  . AORTIC VALVE REPLACEMENT    . CARDIAC CATHETERIZATION    . heart valve replacment      Current Outpatient Medications  Medication Sig Dispense Refill  . amLODipine (NORVASC) 10 MG tablet TAKE 1 TABLET BY MOUTH ONCE DAILY 90 tablet 0  . apixaban (ELIQUIS) 5 MG TABS tablet Take 1 tablet (5 mg total) by mouth 2 (two) times daily. 60 tablet 4  . diclofenac sodium (VOLTAREN) 1 % GEL Apply 2 g topically 4 (four) times daily. 100 g 3  . doxazosin (CARDURA) 8 MG tablet TAKE 1 TABLET BY MOUTH ONCE A DAY 30 tablet 3  . glipiZIDE (GLUCOTROL) 10 MG tablet  TAKE 1 TABLET BY MOUTH TWICE A DAY BEFORE A MEAL. 60 tablet 5  . lisinopril (PRINIVIL,ZESTRIL) 40 MG tablet TAKE 1 TABLET BY MOUTH ONCE A DAY 90 tablet 3  . metoprolol succinate (TOPROL-XL) 50 MG 24 hr tablet Take 1 tablet (50 mg total) by mouth daily. 90 tablet 3  . Omega-3 Krill Oil 500 MG CAPS One by mouth twice a day    . pioglitazone (ACTOS) 30  MG tablet TAKE 1 TABLET BY MOUTH ONCE DAILY 30 tablet 6  . rosuvastatin (CRESTOR) 10 MG tablet TAKE 1 TABLET BY MOUTH ONCE DAILY 90 tablet 0  . furosemide (LASIX) 20 MG tablet Take 1 tablet (20 mg total) by mouth daily as needed. 90 tablet 1   No current facility-administered medications for this visit.      Allergies:   Gabapentin and Hydralazine hcl   Social History:  The patient  reports that he has quit smoking. He has never used smokeless tobacco. He reports that he does not drink alcohol or use drugs.   Family History:   family history includes Cancer in his mother; Stroke in his father.    Review of Systems: Review of Systems  Constitutional: Negative.   Respiratory: Negative.   Cardiovascular: Positive for leg swelling.  Gastrointestinal: Negative.   Musculoskeletal: Positive for joint pain.       Pain in the right foot  Neurological: Negative.   Psychiatric/Behavioral: Negative.   All other systems reviewed and are negative.    PHYSICAL EXAM: VS:  BP (!) 152/82 (BP Location: Left Arm, Patient Position: Sitting, Cuff Size: Normal)   Pulse 82   Ht 5\' 8"  (1.727 m)   Wt 199 lb (90.3 kg)   BMI 30.26 kg/m  , BMI Body mass index is 30.26 kg/m. Constitutional:  oriented to person, place, and time. No distress.  HENT:  Head: Normocephalic and atraumatic.  Eyes:  no discharge. No scleral icterus.  Neck: Normal range of motion. Neck supple. No JVD present.  Cardiovascular: Irregularly irregular , normal heart sounds and intact distal pulses. Exam reveals no gallop and no friction rub.  1+ edema to above the sock line 1/6 SEM RSB Pulmonary/Chest: Effort normal and breath sounds normal. No stridor. No respiratory distress.  no wheezes.  no rales.  no tenderness.  Abdominal: Soft.  no distension.  no tenderness.  Musculoskeletal: Normal range of motion.  no  tenderness or deformity.  Neurological:  normal muscle tone. Coordination normal. No atrophy Skin: Skin is warm and  dry. No rash noted. not diaphoretic.  Psychiatric:  normal mood and affect. behavior is normal. Thought content normal.   Recent Labs: 11/05/2017: ALT 16; BUN 25; Creat 1.41; Hemoglobin 13.0; Magnesium 2.0; Platelets 172; Potassium 4.0; Sodium 140; TSH 3.42    Lipid Panel Lab Results  Component Value Date   CHOL 120 11/05/2017   HDL 28 (L) 11/05/2017   LDLCALC 65 11/05/2017   TRIG 207 (H) 11/05/2017      Wt Readings from Last 3 Encounters:  11/21/17 199 lb (90.3 kg)  11/18/17 197 lb (89.4 kg)  11/05/17 196 lb 6.4 oz (89.1 kg)      ASSESSMENT AND PLAN:  Atrial fibrillation, persistent Timing unclear possibly past several months coinciding with foot swelling ankle swelling Recommended Lasix as needed once or twice a week Would not take every day given chronic renal insufficiency Long discussion concerning various treatment options for his atrial fibrillation Option #1: Wait on treating his atrial fibrillation until after  his hernia surgery Option #2 Delay management of his hernia surgery and either proceed with cardioversion or starting amiodarone in second week of November He will call us after he meets with general surgery  Preop hernia repair He would stop his anticoagulation 2 days prior to hernia surgery No further cardiac testing needed Mildly depressed ejection fraction on echocardiogram likely secondary to atrial fibrillation Would minimize IV fluids during the surgery given atrial fibrillation He will take Lasix as needed for ankle swelling  Hypertension goal BP (blood pressure) < 140/90 - Plan: EKG 12-Lead Blood pressure elevated on today's visit Lasix as needed also recommended he monitor blood pressure at home and call us with numbers  Chronic renal insufficiency Creatinine 1.48 BUN 28 Likely secondary to underlying diabetes Will use Lasix cautiously  Arteriosclerosis of coronary artery - Plan: EKG 12-Lead Currently with no symptoms of angina. No further  workup at this time. Continue current medication regimen.  Cholesterol at goal  S/P AVR (aortic valve replacement) - Plan: EKG 12-Lead Echocardiogram 2019 stable prosthetic valve No further testing needed  Hyperlipidemia Recommend he stay on his Crestor Numbers at goal   Total encounter time more than 45 minutes  Greater than 50% was spent in counseling and coordination of care with the patient   Disposition:   F/U  1 month   Orders Placed This Encounter  Procedures  . EKG 12-Lead     Signed, Esmond Plants, M.D., Ph.D. 11/21/2017  [place], [place]

## 2017-11-21 NOTE — Telephone Encounter (Signed)
Pt is scheduled for today at 2:20 pm.

## 2017-11-21 NOTE — Patient Instructions (Addendum)
Medication Instructions:   Please take lasix/furosemide as needed for leg swelling or shortness of breath  Labwork:  No new labs needed  Testing/Procedures:  No further testing at this time   Follow-Up: It was a pleasure seeing you in the office today. Please call us if you have new issues that need to be addressed before your next appt.  [phone]  Your physician wants you to follow-up in:  Nov 18th   If you need a refill on your cardiac medications before your next appointment, please call your pharmacy.  For educational health videos Log in to : [email or web address] Or : [email or web address], password : [email or web address]

## 2017-12-04 ENCOUNTER — Telehealth: Payer: Self-pay | Admitting: Cardiovascular Disease

## 2017-12-04 ENCOUNTER — Ambulatory Visit (INDEPENDENT_AMBULATORY_CARE_PROVIDER_SITE_OTHER): Payer: Medicare Other | Admitting: Surgery

## 2017-12-04 ENCOUNTER — Encounter: Payer: Self-pay | Admitting: *Deleted

## 2017-12-04 ENCOUNTER — Encounter: Payer: Self-pay | Admitting: Surgery

## 2017-12-04 ENCOUNTER — Other Ambulatory Visit: Payer: Self-pay

## 2017-12-04 VITALS — BP 117/68 | HR 90 | Temp 97.3°F | Resp 16 | Ht 68.0 in | Wt 196.0 lb

## 2017-12-04 DIAGNOSIS — K4090 Unilateral inguinal hernia, without obstruction or gangrene, not specified as recurrent: Secondary | ICD-10-CM | POA: Diagnosis not present

## 2017-12-04 NOTE — Telephone Encounter (Signed)
° °  [facility] Pre-operative Risk Assessment    Request for surgical clearance:  1. What type of surgery is being performed? Robotic Right Inguinal Herina   2. When is this surgery scheduled? January 2020   3. What type of clearance is required (medical clearance vs. Pharmacy clearance to hold med vs. Both)? Medical   4. Are there any medications that need to be held prior to surgery and how long?  5. Practice name and name of physician performing surgery? [facility] Dr Dahlia Byes   6. What is your office phone number [phone]   7.   What is your office fax number [phone]   8.   Anesthesia type (None, local, MAC, general) ?

## 2017-12-04 NOTE — Patient Instructions (Addendum)
  Cardiac clearance with Dr Rockey Situ Surgery January 2020   Inguinal Hernia, Adult An inguinal hernia is when fat or the intestines push through the area where the leg meets the lower belly (groin) and make a rounded lump (bulge). This condition happens over time. There are three types of inguinal hernias. These types include:  Hernias that can be pushed back into the belly (are reducible).  Hernias that cannot be pushed back into the belly (are incarcerated).  Hernias that cannot be pushed back into the belly and lose their blood supply (get strangulated). This type needs emergency surgery.  Follow these instructions at home: Lifestyle  Drink enough fluid to keep your urine (pee) clear or pale yellow.  Eat plenty of fruits, vegetables, and whole grains. These have a lot of fiber. Talk with your doctor if you have questions.  Avoid lifting heavy objects.  Avoid standing for long periods of time.  Do not use tobacco products. These include cigarettes, chewing tobacco, or e-cigarettes. If you need help quitting, ask your doctor.  Try to stay at a healthy weight. General instructions  Do not try to force the hernia back in.  Watch your hernia for any changes in color or size. Let your doctor know if there are any changes.  Take over-the-counter and prescription medicines only as told by your doctor.  Keep all follow-up visits as told by your doctor. This is important. Contact a doctor if:  You have a fever.  You have new symptoms.  Your symptoms get worse. Get help right away if:  The area where the legs meets the lower belly has: ? Pain that gets worse suddenly. ? A bulge that gets bigger suddenly and does not go down. ? A bulge that turns red or purple. ? A bulge that is painful to the touch.  You are a man and your scrotum: ? Suddenly feels painful. ? Suddenly changes in size.  You feel sick to your stomach (nauseous) and this feeling does not go away.  You  throw up (vomit) and this keeps happening.  You feel your heart beating a lot more quickly than normal.  You cannot poop (have a bowel movement) or pass gas. This information is not intended to replace advice given to you by your health care provider. Make sure you discuss any questions you have with your health care provider. Document Released: 02/15/2006 Document Revised: 06/23/2015 Document Reviewed: 11/25/2013 Elsevier Interactive Patient Education  2018 [employer].

## 2017-12-04 NOTE — Progress Notes (Signed)
Patient ID: Timothy Riley, male   DOB: [birth year], 77 y.o.   MRN: [id number]  HPI Timothy Riley is a 77 y.o. male in consultation at the request of Dr. Harlow Mares for a right inguinal hernia.  Reports that over the last couple of years he thinks that he has had some discomfort on his right inguinal hernia.  He relates this to issues with a wheelchair which pushes the wife. No previous inguinal hernias or abdominal operations.  He is able to perform more than 4 METS of activity without any shortness of breath or chest pain.  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  Easily went into A. fib and has been on anticoagulation.  Has been seen by Dr. Rockey Situ and is in the process of deciding cardioversion. Echocardiogram showing preserved ejection fraction.  He had an ultrasound that I have personally reviewed a couple of years ago showing evidence of bilateral hydroceles and also right epididymitis. Creat 1.4, glucose 197  Hb 1 AC 7.3 HPI  Past Medical History:  Diagnosis Date  . Aortic stenosis    a. 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 Glower MD @ [facility] via R thoracic approach.  . Atrial fibrillation ([place]) 10/2017   Dr Rockey Situ  . CKD (chronic kidney disease) stage 3, GFR 30-59 ml/min (HCC) 06/17/2014  . Diabetes mellitus without complication ([place])   . Hypertension   . Non-obstructive CAD (coronary artery disease)    a. 12/2013 Cath ([facility]): LAD 30p, RI 60, otw nl (per CT surgery [facility] from [facility]  Marland Kitchen PSVT (paroxysmal supraventricular tachycardia) ([place])    a. 05/2014 s/p RFCA @ [facility].  Marland Kitchen Urinary incontinence     Past Surgical History:  Procedure Laterality Date  . AORTIC VALVE REPLACEMENT    . CARDIAC CATHETERIZATION    . COLONOSCOPY  2010  . heart valve replacment  2015    Family History  Problem Relation Age of Onset  . Cancer Mother   . Stroke Father     Social History Social History   Tobacco Use  . Smoking status:  Former Smoker    Packs/day: 0.50    Years: 1.00    Pack years: 0.50    Types: Cigarettes    Last attempt to quit: 01/30/1972    Years since quitting: 45.8  . Smokeless tobacco: Never Used  . Tobacco comment: over 40 yrs ago  Substance Use Topics  . Alcohol use: No    Alcohol/week: 0.0 standard drinks  . Drug use: No    Allergies  Allergen Reactions  . Gabapentin     dizzy  . Hydralazine Hcl Rash    Current Outpatient Medications  Medication Sig Dispense Refill  . amLODipine (NORVASC) 10 MG tablet TAKE 1 TABLET BY MOUTH ONCE DAILY 90 tablet 0  . apixaban (ELIQUIS) 5 MG TABS tablet Take 1 tablet (5 mg total) by mouth 2 (two) times daily. 60 tablet 4  . doxazosin (CARDURA) 8 MG tablet TAKE 1 TABLET BY MOUTH ONCE A DAY 30 tablet 3  . furosemide (LASIX) 20 MG tablet Take 1 tablet (20 mg total) by mouth daily as needed. 90 tablet 1  . glipiZIDE (GLUCOTROL) 10 MG tablet TAKE 1 TABLET BY MOUTH TWICE A DAY BEFORE A MEAL. 60 tablet 5  . lisinopril (PRINIVIL,ZESTRIL) 40 MG tablet TAKE 1 TABLET BY MOUTH ONCE A DAY 90 tablet 3  . metoprolol succinate (TOPROL-XL) 50 MG 24 hr tablet Take  1 tablet (50 mg total) by mouth daily. 90 tablet 3  . Omega-3 Krill Oil 500 MG CAPS One by mouth twice a day    . pioglitazone (ACTOS) 30 MG tablet TAKE 1 TABLET BY MOUTH ONCE DAILY 30 tablet 6  . rosuvastatin (CRESTOR) 10 MG tablet TAKE 1 TABLET BY MOUTH ONCE DAILY 90 tablet 0  . TURMERIC PO Take 900 mg by mouth daily.     No current facility-administered medications for this visit.      Review of Systems Full ROS  was asked and was negative except for the information on the HPI  Physical Exam Blood pressure 117/68, pulse 90, temperature (!) 97.3 F (36.3 C), temperature source Skin, resp. rate 16, height 5\' 8"  (1.727 m), weight 196 lb (88.9 kg), SpO2 98 %. CONSTITUTIONAL: NAD EYES: Pupils are equal, round, and reactive to light, Sclera are non-icteric. EARS, NOSE, MOUTH AND THROAT: The oropharynx is  clear. The oral mucosa is pink and moist. Hearing is intact to voice. LYMPH NODES:  Lymph nodes in the neck are normal. RESPIRATORY:  Lungs are clear. There is normal respiratory effort, with equal breath sounds bilaterally, and without pathologic use of accessory muscles. CARDIOVASCULAR: Heart is regular without murmurs, gallops, or rubs. GI: The abdomen is  soft, nontender, and nondistended. There are no palpable masses. There is no hepatosplenomegaly. There are normal bowel sounds in all quadrants. There is a moderate to large reducible right inguinal hernia.  No peritonitis.  No previous incisions. GU: Rectal deferred.   MUSCULOSKELETAL: Normal muscle strength and tone. No cyanosis or edema.   SKIN: Turgor is good and there are no pathologic skin lesions or ulcers. NEUROLOGIC: Motor and sensation is grossly normal. Cranial nerves are grossly intact. PSYCH:  Oriented to person, place and time. Affect is normal.  Data Reviewed  I have personally reviewed the patient's imaging, laboratory findings and medical records.    Assessment/Plan 77 year old male with mildly symptomatic inguinal hernia.  We had a lengthy discussion about options.  Observation versus surgical intervention.  Also we talked about different approaches of open versus minimally invasive and robotic approach.  Given the patient is functional I do think that he is a good candidate for robotic inguinal hernia repair.  We had a lengthy discussion with the patient about the procedure.  Risk benefit and possible complications including but not limited to: Bleeding, infection, chronic pain, recurrence.  We also had a discussion about different hernia mesh issues. Currently anticoagulated in the process of being evaluated by cardiology for his new onset A. fib.  He wishes to have this procedure performed probably in January.  We will obtain cardiac clearance and make sure that we perform this elective surgery after his cardiac issues  have been resolved. Plan for robotic right inguinal hernia repair with mesh in the next couple months , he wishes to have his a fib treated by cariology first.    Caroleen Hamman, MD FACS General Surgeon 12/04/2017, 11:08 AM

## 2017-12-04 NOTE — Progress Notes (Signed)
Patient has decided that he wants to proceed with cardioversion prior to having hernia surgery with Dr. Dahlia Byes.   Hernia surgery to be arranged for 01-2018 once schedule is available.   Note to Dr. Donivan Scull nurse, Jule Ser regarding the above.

## 2017-12-04 NOTE — Telephone Encounter (Signed)
Office visit note with clearance information routed to number listed and provider.

## 2017-12-05 ENCOUNTER — Telehealth: Payer: Self-pay | Admitting: *Deleted

## 2017-12-05 NOTE — Telephone Encounter (Signed)
-----   Message from Minna Merritts, MD sent at 12/03/2017  6:57 PM EST ----- Event monitor Showed atrial fibrillation throughout 2-week. 1 run of ventricular tachycardia 15 seconds This called our attention Was likely asymptomatic as it happened 5:50 in the morning Need to confirm results from prior cardiac catheterization to make sure no ischemia Can we try to obtain prior cardiac cath results for our system If not available may need stress testing to rule out ischemia --Also could consider trying to restore normal sinus rhythm, would use amiodarone when he is ready 400 twice daily for 7 days then down to 200 twice daily, EKG in 2 to 3 weeks time, cardioversion if still in atrial fibrillation

## 2017-12-05 NOTE — Progress Notes (Signed)
See other telephone encounter.

## 2017-12-05 NOTE — Telephone Encounter (Signed)
Left voicemail message for patient to call back to review results and recommendations.

## 2017-12-06 NOTE — Telephone Encounter (Signed)
Left voicemail message to call back  

## 2017-12-09 MED ORDER — AMIODARONE HCL 200 MG PO TABS: 200.0000 mg | ORAL_TABLET | ORAL | 0 refills | Status: DC

## 2017-12-09 NOTE — Telephone Encounter (Signed)
Reviewed results and recommendations with patient and he was agreeable to try the amiodarone. He wanted to reach out to his pharmacist to find out how much this is and potential side effects. Reviewed instructions to start taking amiodarone 200 mg 2 tablets twice a day for 7 days and then 1 tablet twice a day after that with him coming in for EKG in 3 weeks. He verbalized understanding, agreement with plan, and had no further questions at this time. Will have scheduling reach out to get him in for repeat EKG in 3 weeks. He had no further questions at this time. Prescription sent into pharmacy.

## 2017-12-16 ENCOUNTER — Ambulatory Visit: Payer: Medicare Other | Admitting: Cardiovascular Disease

## 2017-12-25 ENCOUNTER — Other Ambulatory Visit: Payer: Self-pay | Admitting: Family Medicine

## 2017-12-25 DIAGNOSIS — E119 Type 2 diabetes mellitus without complications: Secondary | ICD-10-CM

## 2017-12-25 NOTE — Telephone Encounter (Signed)
Glipizide refill not due until December 15th or so

## 2017-12-30 ENCOUNTER — Ambulatory Visit (INDEPENDENT_AMBULATORY_CARE_PROVIDER_SITE_OTHER): Payer: Medicare Other | Admitting: *Deleted

## 2017-12-30 ENCOUNTER — Other Ambulatory Visit
Admission: RE | Admit: 2017-12-30 | Discharge: 2017-12-30 | Disposition: A | Discharge status: Home / Self Care | Payer: Medicare Other | Source: Ambulatory Visit | Attending: Cardiovascular Disease | Admitting: Cardiovascular Disease

## 2017-12-30 VITALS — BP 122/71 | HR 76 | Wt 199.8 lb

## 2017-12-30 DIAGNOSIS — I480 Paroxysmal atrial fibrillation: Secondary | ICD-10-CM

## 2017-12-30 LAB — BASIC METABOLIC PANEL
Anion gap: 8 (ref 5–15)
BUN: 40 mg/dL — AB (ref 8–23)
CO2: 27 mmol/L (ref 22–32)
Calcium: 9.3 mg/dL (ref 8.9–10.3)
Chloride: 105 mmol/L (ref 98–111)
Creatinine, Ser: 1.71 mg/dL — ABNORMAL HIGH (ref 0.61–1.24)
GFR calc Af Amer: 43 mL/min — ABNORMAL LOW (ref >60)
GFR calc non Af Amer: 37 mL/min — ABNORMAL LOW (ref >60)
Glucose, Bld: 187 mg/dL — ABNORMAL HIGH (ref 70–99)
POTASSIUM: 4.3 mmol/L (ref 3.5–5.1)
Sodium: 140 mmol/L (ref 135–145)

## 2017-12-30 LAB — CBC
HCT: 36.8 % — ABNORMAL LOW (ref 39.0–52.0)
Hemoglobin: 12.2 g/dL — ABNORMAL LOW (ref 13.0–17.0)
MCH: 29.8 pg (ref 26.0–34.0)
MCHC: 33.2 g/dL (ref 30.0–36.0)
MCV: 90 fL (ref 80.0–100.0)
Platelets: 160 10*3/uL (ref 150–400)
RBC: 4.09 MIL/uL — ABNORMAL LOW (ref 4.22–5.81)
RDW: 13.7 % (ref 11.5–15.5)
WBC: 5.1 10*3/uL (ref 4.0–10.5)
nRBC: 0 % (ref 0.0–0.2)

## 2017-12-30 MED ORDER — AMIODARONE HCL 200 MG PO TABS: 200.0000 mg | ORAL_TABLET | Freq: Two times a day (BID) | ORAL | 1 refills | Status: DC

## 2017-12-30 NOTE — Progress Notes (Signed)
1.) Reason for visit: Pt had event monitor that was reviewed on 12/03/17 that showed atrial fibrillation. Amiodarone was started 400 mg twice daily for 7 days then 200 twice daily, which he is still taking currently. Today is a follow up EKG.    2.) Name of MD requesting visit: Gollan   3.) H&P:  Aortic valve stenosis, bovine valve replacement, SVT w ablation, DM, HTN, a fib   4.) ROS related to problem: Pt denies any chest pain or SOB. He does report fatigue and believes that it is related to the Amiodarone.   5.) Assessment and plan per MD: Still in a fib per Gollan. Cardioversion needing to be scheduled.

## 2017-12-30 NOTE — Patient Instructions (Addendum)
Medication Instructions:  No changes   If you need a refill on your cardiac medications before your next appointment, please call your pharmacy.   Lab work: Your physician recommends that you return for lab work today in the medical mall (CBC, BMET).    If you have labs (blood work) drawn today and your tests are completely normal, you will receive your results only by: Marland Kitchen MyChart Message (if you have MyChart) OR . A paper copy in the mail If you have any lab test that is abnormal or we need to change your treatment, we will call you to review the results.  Testing/Procedures: You are scheduled for a Cardioversion on ____12/4/19___ with Dr.__Gollan___ Please arrive at the [place] [facility] at __7__ a.m. on the day of your procedure.  DIET INSTRUCTIONS:  Nothing to eat or drink after midnight except your medications with a              sip of water.         1) Labs: __today CBC, BMET_____  2) Medications:  YOU MAY TAKE ALL of your remaining medications (except Lasix) with a small amount of water. Please make sure you take Amiodarone and Eliquis.   3) Must have a responsible person to drive you home.  4) Bring a current list of your medications and current insurance cards.    If you have any questions after you get home, please call the office at [phone]    Follow-Up: At [facility], you and your health needs are our priority.  As part of our continuing mission to provide you with exceptional heart care, we have created designated Provider Care Teams.  These Care Teams include your primary Cardiologist (physician) and Advanced Practice Providers (APPs -  Physician Assistants and Nurse Practitioners) who all work together to provide you with the care you need, when you need it. You will need a follow up appointment in 2 weeks with Dr. Rockey Situ or one of the following Advanced Practice Providers on your designated Care Team:   Murray Hodgkins, NP Christell Faith, PA-C . Marrianne Mood, PA-C

## 2017-12-31 ENCOUNTER — Other Ambulatory Visit: Payer: Self-pay | Admitting: Cardiovascular Disease

## 2017-12-31 ENCOUNTER — Other Ambulatory Visit: Payer: Self-pay | Admitting: Nurse Practitioner

## 2017-12-31 DIAGNOSIS — I10 Essential (primary) hypertension: Secondary | ICD-10-CM

## 2018-01-01 ENCOUNTER — Encounter
Admission: RE | Disposition: A | Discharge status: Home / Self Care | Payer: Self-pay | Source: Ambulatory Visit | Attending: Cardiovascular Disease

## 2018-01-01 ENCOUNTER — Ambulatory Visit: Payer: Medicare Other | Admitting: Certified Registered Nurse Anesthetist

## 2018-01-01 ENCOUNTER — Ambulatory Visit
Admission: RE | Admit: 2018-01-01 | Discharge: 2018-01-01 | Disposition: A | Discharge status: Home / Self Care | Payer: Medicare Other | Source: Ambulatory Visit | Attending: Cardiovascular Disease | Admitting: Cardiovascular Disease

## 2018-01-01 DIAGNOSIS — K4090 Unilateral inguinal hernia, without obstruction or gangrene, not specified as recurrent: Secondary | ICD-10-CM

## 2018-01-01 DIAGNOSIS — I2510 Atherosclerotic heart disease of native coronary artery without angina pectoris: Secondary | ICD-10-CM | POA: Diagnosis not present

## 2018-01-01 DIAGNOSIS — I129 Hypertensive chronic kidney disease with stage 1 through stage 4 chronic kidney disease, or unspecified chronic kidney disease: Secondary | ICD-10-CM | POA: Diagnosis not present

## 2018-01-01 DIAGNOSIS — Z7901 Long term (current) use of anticoagulants: Secondary | ICD-10-CM | POA: Diagnosis not present

## 2018-01-01 DIAGNOSIS — I4819 Other persistent atrial fibrillation: Secondary | ICD-10-CM | POA: Diagnosis not present

## 2018-01-01 DIAGNOSIS — Z7984 Long term (current) use of oral hypoglycemic drugs: Secondary | ICD-10-CM | POA: Insufficient documentation

## 2018-01-01 DIAGNOSIS — N183 Chronic kidney disease, stage 3 (moderate): Secondary | ICD-10-CM | POA: Diagnosis not present

## 2018-01-01 DIAGNOSIS — E119 Type 2 diabetes mellitus without complications: Secondary | ICD-10-CM | POA: Insufficient documentation

## 2018-01-01 DIAGNOSIS — Z79899 Other long term (current) drug therapy: Secondary | ICD-10-CM | POA: Diagnosis not present

## 2018-01-01 DIAGNOSIS — E1122 Type 2 diabetes mellitus with diabetic chronic kidney disease: Secondary | ICD-10-CM | POA: Diagnosis not present

## 2018-01-01 DIAGNOSIS — I10 Essential (primary) hypertension: Secondary | ICD-10-CM | POA: Diagnosis not present

## 2018-01-01 DIAGNOSIS — Z87891 Personal history of nicotine dependence: Secondary | ICD-10-CM | POA: Insufficient documentation

## 2018-01-01 DIAGNOSIS — I4891 Unspecified atrial fibrillation: Secondary | ICD-10-CM | POA: Diagnosis not present

## 2018-01-01 HISTORY — PX: CARDIOVERSION: EP1203

## 2018-01-01 LAB — GLUCOSE, CAPILLARY: GLUCOSE-CAPILLARY: 97 mg/dL (ref 70–99)

## 2018-01-01 SURGERY — CARDIOVERSION (CATH LAB)
Anesthesia: Monitor Anesthesia Care

## 2018-01-01 SURGERY — CARDIOVERSION (CATH LAB)
Anesthesia: General

## 2018-01-01 MED ORDER — SODIUM CHLORIDE 0.9 % IV SOLN
INTRAVENOUS | Status: DC | PRN
  Administered 2018-01-01: 07:00:00 via INTRAVENOUS

## 2018-01-01 MED ORDER — CEFAZOLIN SODIUM-DEXTROSE 2-4 GM/100ML-% IV SOLN: 2.0000 g | INTRAVENOUS | Status: DC

## 2018-01-01 MED ORDER — LIDOCAINE HCL (CARDIAC) PF 100 MG/5ML IV SOSY
PREFILLED_SYRINGE | INTRAVENOUS | Status: DC | PRN
  Administered 2018-01-01: 80 mg via INTRAVENOUS

## 2018-01-01 MED ORDER — PROPOFOL 10 MG/ML IV BOLUS
INTRAVENOUS | Status: DC | PRN
  Administered 2018-01-01: 90 mg via INTRAVENOUS

## 2018-01-01 MED ORDER — LIDOCAINE HCL (PF) 2 % IJ SOLN
INTRAMUSCULAR | Status: AC
  Filled 2018-01-01: qty 10

## 2018-01-01 MED ORDER — PROPOFOL 10 MG/ML IV BOLUS
INTRAVENOUS | Status: AC
  Filled 2018-01-01: qty 20

## 2018-01-01 NOTE — Anesthesia Preprocedure Evaluation (Signed)
Anesthesia Evaluation  Patient identified by MRN, date of birth, ID band Patient awake    Reviewed: Allergy & Precautions, NPO status , Patient's Chart, lab work & pertinent test results  History of Anesthesia Complications Negative for: history of anesthetic complications  Airway Mallampati: III  TM Distance: >3 FB Neck ROM: Full    Dental no notable dental hx.    Pulmonary neg sleep apnea, neg COPD, former smoker,    breath sounds clear to auscultation- rhonchi (-) wheezing      Cardiovascular hypertension, + CAD (nonocclusive)  (-) Past MI, (-) Cardiac Stents and (-) CABG + dysrhythmias Atrial Fibrillation  Rhythm:Regular Rate:Normal - Systolic murmurs and - Diastolic murmurs    Neuro/Psych negative neurological ROS  negative psych ROS   GI/Hepatic negative GI ROS, Neg liver ROS,   Endo/Other  diabetes, Oral Hypoglycemic Agents  Renal/GU Renal InsufficiencyRenal disease     Musculoskeletal  (+) Arthritis ,   Abdominal (+) + obese,   Peds  Hematology negative hematology ROS (+)   Anesthesia Other Findings Past Medical History: No date: Aortic stenosis     Comment:  a. 01/2014 s/p AVR with 23 mm Carpentier-Edwards               pericardial tissue valve by D. Glower MD @ [facility] via R               thoracic approach. 10/2017: Atrial fibrillation (HCC)     Comment:  Dr Rockey Situ 06/17/2014: CKD (chronic kidney disease) stage 3, GFR 30-59 ml/min  (HCC) No date: Diabetes mellitus without complication (HCC) No date: Hypertension No date: Non-obstructive CAD (coronary artery disease)     Comment:  a. 12/2013 Cath ([facility]): LAD 30p, RI 60, otw nl (per CT               surgery note from [place]). No date: PSVT (paroxysmal supraventricular tachycardia) ([place])     Comment:  a. 05/2014 s/p RFCA @ [facility]. No date: Urinary incontinence   Reproductive/Obstetrics                             Anesthesia  Physical Anesthesia Plan  ASA: III  Anesthesia Plan: General   Post-op Pain Management:    Induction: Intravenous  PONV Risk Score and Plan: 1 and Propofol infusion  Airway Management Planned: Natural Airway  Additional Equipment:   Intra-op Plan:   Post-operative Plan:   Informed Consent: I have reviewed the patients History and Physical, chart, labs and discussed the procedure including the risks, benefits and alternatives for the proposed anesthesia with the patient or authorized representative who has indicated his/her understanding and acceptance.   Dental advisory given  Plan Discussed with: CRNA and Anesthesiologist  Anesthesia Plan Comments:         Anesthesia Quick Evaluation

## 2018-01-01 NOTE — Anesthesia Post-op Follow-up Note (Signed)
Anesthesia QCDR form completed.        

## 2018-01-01 NOTE — Anesthesia Postprocedure Evaluation (Signed)
Anesthesia Post Note  Patient: Timothy Riley [facility]  Procedure(s) Performed: CARDIOVERSION (N/A )  Patient location during evaluation: PACU Anesthesia Type: MAC Level of consciousness: awake and alert and oriented Pain management: pain level controlled Vital Signs Assessment: post-procedure vital signs reviewed and stable Respiratory status: spontaneous breathing, nonlabored ventilation and respiratory function stable Cardiovascular status: blood pressure returned to baseline and stable Postop Assessment: no signs of nausea or vomiting Anesthetic complications: no     Last Vitals:  Vitals:   01/01/18 0815 01/01/18 0830  BP: (!) 115/54 (!) 120/59  Pulse: (!) 44 60  Resp: 15 16  Temp:    SpO2: 97% 97%    Last Pain:  Vitals:   01/01/18 0830  TempSrc:   PainSc: 0-No pain                 Timothy Riley

## 2018-01-01 NOTE — H&P (Signed)
H&P Addendum, pre-cardioversion  Patient was seen and evaluated prior to -cardioversion procedure Symptoms, prior testing details again confirmed with the patient Patient examined, no significant change from prior exam Lab work reviewed in detail personally by myself Patient understands risk and benefit of the procedure, willing to proceed  Signed, Tim Yaniyah Koors, MD, Ph.D [facility]  

## 2018-01-01 NOTE — CV Procedure (Signed)
Cardioversion procedure note For atrial fibrillation, persistent  Procedure Details:  Consent: Risks of procedure as well as the alternatives and risks of each were explained to the (patient/caregiver). Consent for procedure obtained.  Time Out: Verified patient identification, verified procedure, site/side was marked, verified correct patient position, special equipment/implants available, medications/allergies/relevent history reviewed, required imaging and test results available. Performed  Patient placed on cardiac monitor, pulse oximetry, supplemental oxygen as necessary.  Sedation given: propofol IV, Dr. Randa Lynn Pacer pads placed anterior and posterior chest.   Cardioverted 2 time(s).  Cardioverted at  150J., 200J Synchronized biphasic Converted to NSR   Evaluation: Findings: Post procedure EKG shows: NSR Complications: None Patient did tolerate procedure well.  Time Spent Directly with the Patient:  14 minutes   Esmond Plants, M.D., Ph.D.

## 2018-01-01 NOTE — Progress Notes (Signed)
Dr. Rockey Situ spoke extensively with pt. And son post cardioversion. 12 lead EKG done: SB. Pt. In no acute distress.

## 2018-01-01 NOTE — Transfer of Care (Signed)
Immediate Anesthesia Transfer of Care Note  Patient: Timothy Riley [facility]  Procedure(s) Performed: CARDIOVERSION (N/A )  Patient Location: Cath Lab  Anesthesia Type:MAC  Level of Consciousness: drowsy  Airway & Oxygen Therapy: Patient Spontanous Breathing and Patient connected to nasal cannula oxygen  Post-op Assessment: Report given to RN and Post -op Vital signs reviewed and stable  Post vital signs: Reviewed and stable  Last Vitals:  Vitals Value Taken Time  BP 107/51 01/01/2018  8:00 AM  Temp    Pulse 47 01/01/2018  8:02 AM  Resp 19 01/01/2018  8:02 AM  SpO2 96 % 01/01/2018  8:02 AM  Vitals shown include unvalidated device data.  Last Pain:  Vitals:   01/01/18 0722  TempSrc: Oral         Complications: No apparent anesthesia complications

## 2018-01-02 ENCOUNTER — Encounter: Payer: Self-pay | Admitting: Cardiovascular Disease

## 2018-01-06 ENCOUNTER — Telehealth: Payer: Self-pay | Admitting: Cardiovascular Disease

## 2018-01-06 DIAGNOSIS — I10 Essential (primary) hypertension: Secondary | ICD-10-CM

## 2018-01-06 MED ORDER — AMIODARONE HCL 200 MG PO TABS: 200.0000 mg | ORAL_TABLET | Freq: Every day | ORAL | 1 refills | Status: DC

## 2018-01-06 MED ORDER — METOPROLOL SUCCINATE ER 25 MG PO TB24: 25.0000 mg | ORAL_TABLET | Freq: Every day | ORAL | Status: DC

## 2018-01-06 NOTE — Telephone Encounter (Signed)
Call placed to the number provided. Per the wife, the paitent was not there and asked if we could call back in 10 minutes.

## 2018-01-06 NOTE — Telephone Encounter (Signed)
Please call to discuss Amiodarone.   STAT if HR is under 50 or over 120 (normal HR is 60-100 beats per minute)  What is your heart rate? 49  1) Do you have a log of your heart rate readings (document readings)? No more reading documented  Do you have any other symptoms? Weak , legs heavy. BP 142/69  Pt states he will be running errands for a couple hours, and ok to leave a message.   Pt states he is having a dental cleaning and needs to know if he should stop Eliquis.

## 2018-01-06 NOTE — Telephone Encounter (Signed)
Recommend decreasing amiodarone to 200 mg daily and metoprolol succinate to 25 mg daily.  Further recommendations per Dr. Rockey Situ when he returns.  Nelva Bush, MD [facility] Pager: [phone]

## 2018-01-06 NOTE — Telephone Encounter (Signed)
Pt call for c/o bradycardia on Amiodarone after cardioversion 01/01/18.  He reports that he takes Amiodarone daily and was instructed to take  "Take 1 tablet (200 mg total) by mouth 2 (two) times daily. Take 2 tablets twice a day for 7 days then take 1 tablet twice daily".  He was instructed to take pulse daily and call if HR < 50.   He reports today that HR is 49 and yesterday was 52. He is feeling weakness, fatigue and feels legs are heavy. Denies chest pain or SOB.    He reports that today he took 1 tablet (200 mg) and will not take anymore.  He reports, "I am only going to take 1 a day if my heart rate is low".   Pt expressing concerns that he has not been able to meet with Dr. Rockey Situ d/t access issues.   Pt has scheduled f/u with Sharolyn Douglas on 01/16/18 11:30AM.   Message routed to DOD for further guidance.

## 2018-01-06 NOTE — Telephone Encounter (Signed)
Alternate number after 5 today [phone]

## 2018-01-06 NOTE — Telephone Encounter (Signed)
See prev note for information on call.  Message was routed to MD End, DOD.    His reply is as follows,  "Recommend decreasing amiodarone to 200 mg daily and metoprolol succinate to 25 mg daily.  Further recommendations per Dr. Rockey Situ when he returns.  Nelva Bush, MD"

## 2018-01-06 NOTE — Telephone Encounter (Signed)
Pt is returning your call, he will be available until 5 pm

## 2018-01-06 NOTE — Telephone Encounter (Signed)
Attempted to call patient back and he was still unavailable. Message has been left with the dose changes for the medications with the wife, per dpr. She repeated back the changes and verbalized her understanding. She has been advised to have the patient call back tomorrow if he has any questions.

## 2018-01-13 ENCOUNTER — Telehealth: Payer: Self-pay | Admitting: *Deleted

## 2018-01-13 NOTE — Telephone Encounter (Signed)
-----   Message from Minna Merritts, MD sent at 01/11/2018  3:00 PM EST ----- Labs from last week looked a little "prerenal" CR elevated above his baseline Is he taking too much lasix? thx TG

## 2018-01-13 NOTE — Telephone Encounter (Signed)
Left voicemail message to call back for results.  

## 2018-01-15 ENCOUNTER — Other Ambulatory Visit: Payer: Self-pay | Admitting: Cardiovascular Disease

## 2018-01-16 ENCOUNTER — Ambulatory Visit: Payer: Medicare Other | Admitting: Nurse Practitioner

## 2018-01-17 ENCOUNTER — Encounter: Payer: Self-pay | Admitting: Nurse Practitioner

## 2018-01-20 ENCOUNTER — Telehealth: Payer: Self-pay | Admitting: *Deleted

## 2018-01-20 NOTE — Telephone Encounter (Signed)
Message left for patient to call the office.   Per Timothy Riley at Dr. Donivan Scull office patient was a no show for follow up appointment on 01-16-18. He did have his cardioversion on 01-01-18.  Patient needs to get appointment with [facility] rescheduled so we can get cardiac clearance and schedule hernia surgery with Dr. Dahlia Byes.  The patient will need a pre-op visit with Dr. Dahlia Byes scheduled as well.

## 2018-01-20 NOTE — Telephone Encounter (Signed)
Patient called the office back and states he forgot about follow up appointment with [facility].   The appointment has been rescheduled for 02-06-2018 at 8:20 am with Dr. Rockey Situ.   Patient wishes to see Dr. Rockey Situ first before scheduling pre-op visit or surgery with Dr. Dahlia Byes. He is aware we need documentation regarding cardiac clearance and when he can come off Eliquis.

## 2018-01-20 NOTE — Telephone Encounter (Signed)
-----   Message from Jules Husbands, MD sent at 12/04/2017  5:15 PM EST ----- That will be ideal. thanks ----- Message ----- From: Dominga Ferry, CMA Sent: 12/04/2017   1:09 PM EST To: Jules Husbands, MD  Do you need to see patient for a pre-op visit since surgery will be in January? Thanks.

## 2018-01-24 ENCOUNTER — Other Ambulatory Visit: Payer: Self-pay | Admitting: Family Medicine

## 2018-01-24 DIAGNOSIS — E119 Type 2 diabetes mellitus without complications: Secondary | ICD-10-CM

## 2018-01-27 ENCOUNTER — Encounter: Payer: Self-pay | Admitting: Nurse Practitioner

## 2018-01-27 ENCOUNTER — Ambulatory Visit (INDEPENDENT_AMBULATORY_CARE_PROVIDER_SITE_OTHER): Payer: Medicare Other | Admitting: Nurse Practitioner

## 2018-01-27 VITALS — BP 148/58 | HR 72 | Temp 98.5°F | Resp 18 | Ht 68.0 in | Wt 197.3 lb

## 2018-01-27 DIAGNOSIS — J069 Acute upper respiratory infection, unspecified: Secondary | ICD-10-CM

## 2018-01-27 MED ORDER — BENZONATATE 100 MG PO CAPS: 100.0000 mg | ORAL_CAPSULE | Freq: Two times a day (BID) | ORAL | 0 refills | Status: DC | PRN

## 2018-01-27 NOTE — Patient Instructions (Signed)
-   Please call cardiologist to discuss amiodarone - Please take cough medicine as needed - You can add musinex to help break up chest congestion - You can take honey- lemon teas to soothe throat - If symptoms do not improve in 2-3 days or worsen at any time please call back.

## 2018-01-27 NOTE — Progress Notes (Signed)
Name: Timothy Riley   MRN: [id number]    DOB: [birth year]   Date:01/27/2018       Progress Note  Subjective  Chief Complaint  Chief Complaint  Patient presents with  . Sore Throat  . Ear Pain  . Fatigue    HPI  States 3-4 days ago started coughing dry cough first then productive- worse in first thing in the morning, mild rhinorrhea, fatigue (States since he has been on amiodarone), notes sore throat in the last few days- got better yesterday and today. Bilateral ear pain started then too and has resolved.   No shortness of breath, chest pain, fever, chills.   Patient Active Problem List   Diagnosis Date Noted  . Type 2 diabetes mellitus with complication, without long-term current use of insulin ([place]) 11/05/2017  . Arthritis of both feet 11/05/2017  . Overweight (BMI 25.0-29.9) 11/05/2017  . Hyperlipidemia 03/29/2017  . Hypertriglyceridemia 10/26/2015  . Low serum HDL 10/26/2015  . Encounter for medication monitoring 09/26/2015  . Medicare annual wellness visit, subsequent 03/29/2015  . H/O colonoscopy with polypectomy 08/27/2014  . S/P AVR (aortic valve replacement) 08/24/2014  . 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  . Hypertension goal BP (blood pressure) < 140/90 08/24/2014  . Need for pneumococcal vaccination 08/24/2014  . CKD (chronic kidney disease) stage 3, GFR 30-59 ml/min (HCC) 06/17/2014  . Atrial fibrillation ([place]) 06/17/2014  . H/O paroxysmal supraventricular tachycardia 05/31/2014  . SVT (supraventricular tachycardia) ([place]) 05/29/2014  . Awareness of heartbeats 03/31/2014  . 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 Arteriosclerosis of coronary artery 01/11/2014  . Aortic heart valve narrowing 12/31/2013  . 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 Mixed hyperlipidemia 12/01/2013    Past Medical History:  Diagnosis Date  .  Aortic stenosis    a. 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 Glower MD @ [facility] via R thoracic approach.  . Atrial fibrillation ([place]) 10/2017   Dr Rockey Situ  . CKD (chronic kidney disease) stage 3, GFR 30-59 ml/min (HCC) 06/17/2014  . Diabetes mellitus without complication ([place])   . Hypertension   . Non-obstructive CAD (coronary artery disease)    a. 12/2013 Cath ([facility]): LAD 30p, RI 60, otw nl (per CT surgery note from [facility]).  Marland Kitchen PSVT (paroxysmal supraventricular tachycardia) ([place])    a. 05/2014 s/p RFCA @ [facility].  Marland Kitchen Urinary incontinence     Past Surgical History:  Procedure Laterality Date  . AORTIC VALVE REPLACEMENT    . CARDIAC CATHETERIZATION    . CARDIOVERSION N/A 01/01/2018   Procedure: CARDIOVERSION;  Surgeon: Minna Merritts, MD;  Location: [facility] ORS;  Service: Cardiovascular;  Laterality: N/A;  . COLONOSCOPY  2010  . heart valve replacment  2015    Social History   Tobacco Use  . Smoking status: Former Smoker    Packs/day: 0.50    Years: 1.00    Pack years: 0.50    Types: Cigarettes    Last attempt to quit: 01/30/1972    Years since quitting: 46.0  . Smokeless tobacco: Never Used  . Tobacco comment: over 45 yrs ago  Substance Use Topics  . Alcohol use: No    Alcohol/week: 0.0 standard drinks     Current Outpatient Medications:  .  amiodarone (PACERONE) 200 MG tablet, Take 1 tablet (200 mg total) by mouth daily., Disp: 90 tablet, Rfl: 1 .  amLODipine (NORVASC) 10 MG tablet, TAKE 1 TABLET BY MOUTH ONCE A DAY, Disp: 90 tablet, Rfl: 0 .  apixaban (ELIQUIS) 5 MG TABS tablet, Take 1 tablet (5 mg total) by mouth 2 (two) times daily., Disp: 60 tablet, Rfl: 4 .  doxazosin (CARDURA) 8 MG tablet, Take 1 tablet (8 mg total) by mouth at bedtime., Disp: 30 tablet, Rfl: 3 .  furosemide (LASIX) 20 MG tablet, Take 1 tablet (20 mg total) by mouth daily as needed. (Patient taking differently: Take 20 mg by mouth daily as needed for edema. ), Disp: 90  tablet, Rfl: 1 .  glipiZIDE (GLUCOTROL) 10 MG tablet, TAKE 1 TABLET BY MOUTH TWICE (2) DAILY BEFORE A MEAL, Disp: 60 tablet, Rfl: 5 .  lisinopril (PRINIVIL,ZESTRIL) 40 MG tablet, TAKE 1 TABLET BY MOUTH ONCE A DAY, Disp: 90 tablet, Rfl: 3 .  metoprolol succinate (TOPROL-XL) 25 MG 24 hr tablet, Take 1 tablet (25 mg total) by mouth at bedtime., Disp: , Rfl:  .  pioglitazone (ACTOS) 30 MG tablet, TAKE 1 TABLET BY MOUTH ONCE DAILY (Patient not taking: Reported on 12/31/2017), Disp: 30 tablet, Rfl: 6 .  rosuvastatin (CRESTOR) 10 MG tablet, TAKE 1 TABLET BY MOUTH ONCE DAILY, Disp: 90 tablet, Rfl: 0 .  TURMERIC PO, Take 1,000 mg by mouth daily. , Disp: , Rfl:   Allergies  Allergen Reactions  . Gabapentin     dizzy  . Hydralazine Hcl Rash    ROS   No other specific complaints in a complete review of systems (except as listed in HPI above).  Objective  Vitals:   01/27/18 1418 01/27/18 1422  BP: (!) 158/62 (!) 148/58  Pulse: 72   Resp: 18   Temp: 98.5 F (36.9 C)   TempSrc: Oral   SpO2: 97%   Weight: 197 lb 4.8 oz (89.5 kg)   Height: 5\' 8"  (1.727 m)     Body mass index is 30 kg/m.  Nursing Note and Vital Signs reviewed.  Physical Exam Constitutional:      Appearance: He is well-developed. He is not ill-appearing.  HENT:     Right Ear: Tympanic membrane and ear canal normal.     Left Ear: Tympanic membrane and ear canal normal.     Nose: No congestion.     Mouth/Throat:     Mouth: Mucous membranes are dry.     Pharynx: Oropharynx is clear.     Tonsils: No tonsillar exudate or tonsillar abscesses.  Eyes:     Conjunctiva/sclera: Conjunctivae normal.     Pupils: Pupils are equal, round, and reactive to light.  Neck:     Musculoskeletal: Normal range of motion.  Cardiovascular:     Rate and Rhythm: Normal rate and regular rhythm.  Pulmonary:     Effort: Pulmonary effort is normal.     Breath sounds: Normal breath sounds.  Lymphadenopathy:     Cervical: No cervical  adenopathy.  Skin:    General: Skin is warm and dry.     Findings: No erythema.  Neurological:     General: No focal deficit present.     Mental Status: He is alert and oriented to person, place, and time.  Psychiatric:        Mood and Affect: Mood normal.        Behavior: Behavior normal.      No results found for this or any previous visit (from the past 48 hour(s)).  Assessment & Plan  1. Viral upper respiratory tract infection -  benzonatate (TESSALON) 100 MG capsule; Take 1 capsule (100 mg total) by mouth 2 (two) times daily as needed for cough.  Dispense: 30 capsule; Refill: 0

## 2018-01-30 ENCOUNTER — Telehealth: Payer: Self-pay | Admitting: Cardiovascular Disease

## 2018-01-30 NOTE — Telephone Encounter (Signed)
Left voicemail message to call back  

## 2018-01-30 NOTE — Telephone Encounter (Signed)
Pt states the Amiodarone is making him weak.  Pt c/o medication issue: weakness  1. Name of Medication: amiodarone  2. How are you currently taking this medication (dosage and times per day)? 200mg  daily   3. Are you having a reaction (difficulty breathing--STAT)? no  4. What is your medication issue? weakness

## 2018-01-30 NOTE — Telephone Encounter (Signed)
Spoke with patient and reviewed providers request to see how frequent he is taking the furosemide. He states that he has not had to take it because he has not had a problem with swelling. He states that he has been sick and had some dizziness and weakness. He wanted to review medications and if he should continue amiodarone. He was somewhat upset because at his last appointment he didn't get to see provider and was rescheduled and didn't get a reminder call so he missed that appointment as well. Apologized for any delays or confusion. Reviewed medications with him in detail and confirmed his upcoming appointment with provider. He verbalized understanding of our conversation, agreement with plan, and had no further questions at this time.

## 2018-01-30 NOTE — Telephone Encounter (Signed)
Pt reports he is on 200 mg once daily of amiodarone, he is having weakness in the mornings since starting med but resolves throughout the day.  He reports recent cold like sx which seem to be making sx worse. He denies taking vitals today but reported that he would call back with any issues.  Pt sounded well in NAD.  He has f/u with Dr. Rockey Situ on 1/9.  I recommended that he write down the questions that he has for him.    Advised pt to call for any further questions or concerns

## 2018-02-04 NOTE — Progress Notes (Signed)
Cardiology Office Note  Date:  02/06/2018   ID:  Timothy Riley, DOB 1940/12/21, MRN [id number]  PCP:  Arnetha Courser, MD   Chief Complaint  Patient presents with  . other    2 week follow up Cardioversion. Medicatations reviewed verbally.     HPI:  Timothy Riley is a very pleasant 78 year old gentleman with history of   Persistent atrial fibrillation Former smoker Severe aortic valve stenosis,  status post AVR 03/01/2014 at [facility] with bovine valve,  SVT episodes since his AVR, requiring ablation 06/30/2014 again at [facility],  diabetes type 2 with most recent hemoglobin A1c 7.3,  BPH,  normal ejection fraction  who presents for routine follow-up of his bioprosthetic valve, hypertension , atrial fibrillation  Reports feeling weak on amiodarone for several months Now got use to it, taking metoprolol 12.5 daily as he had bradycardia on full dose Stress: wife into the hospital last night Needs surgery on hernia, wants to put it off, Dr. Dahlia Byes Wife with pneumonia Most of his time spent taking care of his wife Wife in [place], has debility, memory issues He has to lift her, move her in a wheelchair No regular exercise program  Heart rate at home: 70  No leg swelling CR 1.7, BUN 40  Taking metoprolol 12.5 daily with amiodarone On eliquis 5 BID  EKG personally reviewed by myself on todays visit Shows normal sinus rhythm with ventricular rate 51 bpm right bundle branch block Was holding his breath secondary to artifact leading to slower heart rate  Other past medical history reviewed Timothy Riley to the emergency room November 05, 2017 atrial fibrillation, Reported some ankle swelling Aspirin was held Started on anticoagulation at that time, eliquis 5 BID  Echocardiogram performed showing mildly depressed ejection fraction 45%  HAB1C 7.3  Previous echocardiogram reviewed with him in detail from  03/2016 The estimated ejection fraction was in the range of 55% to 65%.  Recent  echocardiogram with decline in ejection fraction down to 45%, now in atrial fibrillation  He was hypotensive on a previous clinic visit, stopped his tamsulosin, chlorthalidone Reports having tried a statin in the past, felt jittery  Previous notes from outside cardiologist says he has mild coronary artery disease, details not available through care everywhere Followed by endocrine for his diabetes, hemoglobin A1c of >7  Prior episodes of SVT requiring adenosine He denies any significant CAD by prior catheterization.   PMH:   has a past medical history of Aortic stenosis, Atrial fibrillation ([place]) (10/2017), CKD (chronic kidney disease) stage 3, GFR 30-59 ml/min ([place]) (06/17/2014), Diabetes mellitus without complication ([place]), Hypertension, Non-obstructive CAD (coronary artery disease), PSVT (paroxysmal supraventricular tachycardia) ([place]), and Urinary incontinence.  PSH:    Past Surgical History:  Procedure Laterality Date  . AORTIC VALVE REPLACEMENT    . CARDIAC CATHETERIZATION    . CARDIOVERSION N/A 01/01/2018   Procedure: CARDIOVERSION;  Surgeon: Minna Merritts, MD;  Location: [facility] ORS;  Service: Cardiovascular;  Laterality: N/A;  . COLONOSCOPY  2010  . heart valve replacment  2015    Current Outpatient Medications  Medication Sig Dispense Refill  . amiodarone (PACERONE) 200 MG tablet Take 1 tablet (200 mg total) by mouth daily. 90 tablet 1  . amLODipine (NORVASC) 10 MG tablet TAKE 1 TABLET BY MOUTH ONCE A DAY 90 tablet 0  . apixaban (ELIQUIS) 5 MG TABS tablet Take 1 tablet (5 mg total) by mouth 2 (two) times daily. 60 tablet 4  . benzonatate (TESSALON) 100 MG  capsule Take 1 capsule (100 mg total) by mouth 2 (two) times daily as needed for cough. 30 capsule 0  . doxazosin (CARDURA) 8 MG tablet Take 1 tablet (8 mg total) by mouth at bedtime. 30 tablet 3  . furosemide (LASIX) 20 MG tablet Take 1 tablet (20 mg total) by mouth daily as needed. (Patient taking differently: Take 20  mg by mouth daily as needed for edema. ) 90 tablet 1  . glipiZIDE (GLUCOTROL) 10 MG tablet TAKE 1 TABLET BY MOUTH TWICE (2) DAILY BEFORE A MEAL 60 tablet 5  . lisinopril (PRINIVIL,ZESTRIL) 40 MG tablet TAKE 1 TABLET BY MOUTH ONCE A DAY 90 tablet 3  . metoprolol succinate (TOPROL-XL) 25 MG 24 hr tablet Take 1 tablet (25 mg total) by mouth at bedtime.    . pioglitazone (ACTOS) 30 MG tablet TAKE 1 TABLET BY MOUTH ONCE DAILY 30 tablet 6  . rosuvastatin (CRESTOR) 10 MG tablet TAKE 1 TABLET BY MOUTH ONCE DAILY 90 tablet 0  . TURMERIC PO Take 1,000 mg by mouth daily.      No current facility-administered medications for this visit.      Allergies:   Gabapentin and Hydralazine hcl   Social History:  The patient  reports that he quit smoking about 46 years ago. His smoking use included cigarettes. He has a 0.50 pack-year smoking history. He has never used smokeless tobacco. He reports that he does not drink alcohol or use drugs.   Family History:   family history includes Cancer in his mother; Stroke in his father.    Review of Systems: Review of Systems  Constitutional: Negative.   Respiratory: Negative.   Cardiovascular: Negative.   Gastrointestinal: Negative.   Musculoskeletal: Positive for joint pain.       Pain in the right foot  Neurological: Negative.   Psychiatric/Behavioral: Negative.   All other systems reviewed and are negative.    PHYSICAL EXAM: VS:  BP 140/62 (BP Location: Left Arm, Patient Position: Sitting, Cuff Size: Normal)   Pulse (!) 55   Ht 6' (1.829 m)   Wt 196 lb (88.9 kg)   BMI 26.58 kg/m  , BMI Body mass index is 26.58 kg/m. Constitutional:  oriented to person, place, and time. No distress.  HENT:  Head: Grossly normal Eyes:  no discharge. No scleral icterus.  Neck: No JVD, no carotid bruits  Cardiovascular: Regular rate and rhythm, no murmurs appreciated Pulmonary/Chest: Clear to auscultation bilaterally, no wheezes or rails Abdominal: Soft.  no  distension.  no tenderness.  Musculoskeletal: Normal range of motion Neurological:  normal muscle tone. Coordination normal. No atrophy Skin: Skin warm and dry Psychiatric: normal affect, pleasant    Recent Labs: 11/05/2017: ALT 16; Magnesium 2.0; TSH 3.42 12/30/2017: BUN 40; Creatinine, Ser 1.71; Hemoglobin 12.2; Platelets 160; Potassium 4.3; Sodium 140    Lipid Panel Lab Results  Component Value Date   CHOL 120 11/05/2017   HDL 28 (L) 11/05/2017   LDLCALC 65 11/05/2017   TRIG 207 (H) 11/05/2017      Wt Readings from Last 3 Encounters:  02/06/18 196 lb (88.9 kg)  01/27/18 197 lb 4.8 oz (89.5 kg)  01/01/18 199 lb (90.3 kg)     ASSESSMENT AND PLAN:  Atrial fibrillation, persistent Maintaining normal sinus rhythm after cardioversion on low-dose amiodarone 12.5 daily with low-dose amiodarone 200 daily and Eliquis 5 twice daily Discussed with him that we will need to watch his creatinine as he turns 78 Recently appear dehydrated creatinine 1.7  in December 2019 but baseline creatinine 1.4  Preop hernia repair He would stop his anticoagulation 2 days prior to hernia surgery No further cardiac testing needed He would like to hold off on the surgery as wife is in the hospital with pneumonia  Hypertension goal BP (blood pressure) < 140/90 - Plan: EKG 12-Lead Blood pressure is well controlled on today's visit. No changes made to the medications.  Chronic renal insufficiency Creatinine 1.48 BUN 28 baseline likely from diabetes Likely dehydrated December creatinine up to 1.7 Encouraged him to stay hydrated avoid Lasix He does not take Lasix on a regular basis  Arteriosclerosis of coronary artery - Plan: EKG 12-Lead Currently with no symptoms of angina. No further workup at this time. Continue current medication regimen.  Cholesterol at goal Stable  S/P AVR (aortic valve replacement) - Plan: EKG 12-Lead Echocardiogram 2019 stable prosthetic valve No further testing needed,  will need antibiotics prior to surgery  Hyperlipidemia Recommend he stay on his Crestor Numbers at goal, discussed with him   Total encounter time more than 25 minutes  Greater than 50% was spent in counseling and coordination of care with the patient   Disposition:   F/U  6 month   Orders Placed This Encounter  Procedures  . EKG 12-Lead     Signed, Esmond Plants, M.D., Ph.D. 02/06/2018  [facility] [place], [place] [phone]

## 2018-02-06 ENCOUNTER — Ambulatory Visit: Payer: Medicare Other | Admitting: Cardiovascular Disease

## 2018-02-06 ENCOUNTER — Encounter: Payer: Self-pay | Admitting: Cardiovascular Disease

## 2018-02-06 VITALS — BP 140/62 | HR 55 | Ht 72.0 in | Wt 196.0 lb

## 2018-02-06 DIAGNOSIS — I480 Paroxysmal atrial fibrillation: Secondary | ICD-10-CM

## 2018-02-06 DIAGNOSIS — I471 Supraventricular tachycardia, unspecified: Secondary | ICD-10-CM

## 2018-02-06 DIAGNOSIS — E118 Type 2 diabetes mellitus with unspecified complications: Secondary | ICD-10-CM | POA: Diagnosis not present

## 2018-02-06 DIAGNOSIS — N183 Chronic kidney disease, stage 3 unspecified: Secondary | ICD-10-CM

## 2018-02-06 DIAGNOSIS — E782 Mixed hyperlipidemia: Secondary | ICD-10-CM

## 2018-02-06 NOTE — Patient Instructions (Addendum)

## 2018-02-17 NOTE — Telephone Encounter (Signed)
Per Dr. Rockey Situ: Patient would stop his anticoagulation 2 days prior to hernia surgery No further cardiac testing needed He would like to hold off on the surgery as wife is in the hospital with pneumonia

## 2018-02-19 ENCOUNTER — Telehealth: Payer: Self-pay

## 2018-02-19 NOTE — Telephone Encounter (Signed)
Patient returning call to [facility] about scheduling AWV. States that he is holding off on doing that until after February to see what is going to be done about his hernia. Will call to schedule.

## 2018-02-19 NOTE — Telephone Encounter (Signed)
Attempted to reach patient to scheduled Medicare AWV. Left msg for pt to contact me directly at [phone] or call the office.

## 2018-02-19 NOTE — Telephone Encounter (Signed)
Will follow up with patient in March if he has not rescheduled. Thank you!

## 2018-03-03 ENCOUNTER — Encounter: Payer: Self-pay | Admitting: *Deleted

## 2018-03-03 NOTE — Progress Notes (Signed)
Patient came by the office today wanting to schedule surgery for 04-15-18 with Dr. Dahlia Byes.   The patient will require a pre-op visit and will need to pre-admit. We are trying to get these the same day back to back .  Patient will be contacted once Pre-admission appointment has been scheduled with [facility]. We will try and have the patient see Dr. Dahlia Byes on 04-07-18 at 10:30 am and pre-admit after.   The patient will be contacted once this is arranged to confirm date and times.   Patient will need to hold Eliquis 2 days prior to surgery per Dr. Rockey Situ. See note in Epic from 12-04-17.

## 2018-03-04 ENCOUNTER — Telehealth: Payer: Self-pay

## 2018-03-04 NOTE — Telephone Encounter (Signed)
Message left for patient notifying him that he is scheduled for pre admit testing at [facility] on 04/07/18 at 11:45 am. He will be seen by Dr Dahlia Byes that day at 10:30 am and then will go over to pre admit testing that day.

## 2018-03-13 NOTE — Progress Notes (Signed)
Cardiology Office Note Date:  03/14/2018  Patient ID:  Timothy Riley, DOB [birth year], MRN [id number] PCP:  Arnetha Courser, MD  Cardiologist:  Dr. Rockey Situ, MD    Chief Complaint: Chest discomfort  History of Present Illness: Timothy Riley is a 78 y.o. male with history of nonobstructive CAD, persistent A. fib on Eliquis diagnosed in 10/2017, severe aortic valve stenosis status post bovine AVR on 03/01/2014 at [facility], SVT status post ablation on 07/02/158 at [place], systolic dysfunction, DM2, CKD stage III, HTN, and BPH who presents for evaluation of chest discomfort.  Prior cardiac cath in 12/2013 at [facility] in the setting of work-up for his aortic valve stenosis showed nonobstructive disease with proximal LAD 30% stenosis, ramus intermedius 60% stenosis, otherwise normal.  He subsequently underwent bovine AVR on 03/01/2014.  Following this, he developed SVT and underwent RCFA on 06/30/2014.  Echo in 03/2016 showed an EF of 55 to 65%, mild to moderate LVH, hypokinesis of the anteroseptal myocardium, grade 1 diastolic dysfunction, bioprosthetic aortic valve was noted with mild stenosis and trivial regurgitation, mildly dilated left atrium, RV systolic function normal, PASP normal.  He was diagnosed with A. fib in 10/2017 and was started on Eliquis at that time.  Follow-up echo on 11/13/2017 showed a mildly reduced LV systolic function with an EF of 45 to 50%, moderate LVH, diffuse hypokinesis, bioprosthetic aortic valve was noted with mild stenosis and mild regurgitation, mild mitral regurgitation, mildly dilated left atrium, mildly dilated RV with mildly reduced RV systolic function, mildly dilated right atrium, mild to moderate tricuspid regurgitation, dilated IVC consistent with mildly elevated CVP.  Outpatient cardiac monitoring in 10/2017 showed persistent A. fib that occurred continuously with 100% burden with heart rates ranging from 58 to 152 bpm with an average ventricular rate of 95 bpm.  One run of  nonsustained VT occurred lasting 14.9 seconds with a maximum rate of 193 bpm and an average rate of 161 bpm at 5:46 AM.  No patient triggered was noted.  He was loaded with amiodarone for plans of cardioversion.  Following this, he noted increased and fatigue and felt like this was related to amiodarone.  He subsequently underwent successful cardioversion on 01/01/2018, requiring 2 attempts.  He was last seen by his primary cardiologist on 02/06/2018 for follow-up of DCCV.  At that time, he reported he had gotten used to amiodarone.  He was taking metoprolol 12.5 mg daily as he reported bradycardia on full dose metoprolol.  EKG showed sinus bradycardia, 52 bpm, RBBB.  At that time, he noted impending hernia repair.  He was felt to be acceptable risk for noncardiac surgery without further cardiac testing needed.  It was recommended he hold anticoagulation 2 days prior to surgery.  Patient called our office wanting to be seen prior to his upcoming hernia repair surgery as he had noted mild chest pain.  Labs: 12/2017 - potassium 4.3, BUN 40, serum creatinine 1.71 (baseline 1.4), Hgb 12.2 10/2017 - LDL 106, AST/ALT normal, magnesium 2.0, TSH normal  Patient comes in noting a 1 to 1.5-week history of substernal chest discomfort that occurs at rest and lasts for 1 to 2 minutes followed by spontaneous resolution.  This chest discomfort will happen multiple times per day.  He last had chest discomfort earlier this morning.  He is currently symptom-free.  There is no associated shortness of breath, palpitations, dizziness, presyncope, or syncope.  Exertion, including pushing his wife in her wheelchair, does not exacerbate his chest discomfort.  No lower extremity swelling, abdominal distention, orthopnea, PND, early satiety.  He is tentatively scheduled to undergo hernia repair on 04/15/2018.  We contacted the patient's pharmacy to find out definitively his dose of metoprolol as he has previously been advised to take half  of a 25 mg tablet which will be 12.5 mg daily.  Patient feels like he has been taking half of a 50 mg tab daily.  His pharmacy verifies that he has been filling metoprolol succinate 50 mg half tab daily.   Past Medical History:  Diagnosis Date  . Aortic stenosis    a. 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 Glower MD @ [facility] via R thoracic approach.  . Atrial fibrillation ([place]) 10/2017   Dr Rockey Situ  . CKD (chronic kidney disease) stage 3, GFR 30-59 ml/min ([facility]) 06/17/2014  . Diabetes mellitus without complication ([place])   . Hypertension   . Non-obstructive CAD (coronary artery disease)    a. 12/2013 Cath ([facility]): LAD 30p, RI 60, otw nl (per CT surgery note from [facility]).  Marland Kitchen PSVT (paroxysmal supraventricular tachycardia) ([place])    a. 05/2014 s/p RFCA @ [facility].  Marland Kitchen Urinary incontinence     Past Surgical History:  Procedure Laterality Date  . AORTIC VALVE REPLACEMENT    . CARDIAC CATHETERIZATION    . CARDIOVERSION N/A 01/01/2018   Procedure: CARDIOVERSION;  Surgeon: Minna Merritts, MD;  Location: [facility] ORS;  Service: Cardiovascular;  Laterality: N/A;  . COLONOSCOPY  2010  . heart valve replacment  2015    Current Meds  Medication Sig  . amiodarone (PACERONE) 200 MG tablet Take 1 tablet (200 mg total) by mouth daily.  Marland Kitchen amLODipine (NORVASC) 10 MG tablet TAKE 1 TABLET BY MOUTH ONCE A DAY  . apixaban (ELIQUIS) 5 MG TABS tablet Take 1 tablet (5 mg total) by mouth 2 (two) times daily.  Marland Kitchen doxazosin (CARDURA) 8 MG tablet Take 1 tablet (8 mg total) by mouth at bedtime.  . furosemide (LASIX) 20 MG tablet Take 1 tablet (20 mg total) by mouth daily as needed. (Patient taking differently: Take 20 mg by mouth daily as needed for edema. )  . glipiZIDE (GLUCOTROL) 10 MG tablet TAKE 1 TABLET BY MOUTH TWICE (2) DAILY BEFORE A MEAL  . lisinopril (PRINIVIL,ZESTRIL) 40 MG tablet TAKE 1 TABLET BY MOUTH ONCE A DAY  . metoprolol succinate (TOPROL-XL) 50 MG 24 hr tablet Take 25 mg  by mouth daily.   . pioglitazone (ACTOS) 30 MG tablet TAKE 1 TABLET BY MOUTH ONCE DAILY  . rosuvastatin (CRESTOR) 10 MG tablet TAKE 1 TABLET BY MOUTH ONCE DAILY  . TURMERIC PO Take 1,000 mg by mouth daily.     Allergies:   Gabapentin and Hydralazine hcl   Social History:  The patient  reports that he quit smoking about 46 years ago. His smoking use included cigarettes. He has a 0.50 pack-year smoking history. He has never used smokeless tobacco. He reports that he does not drink alcohol or use drugs.   Family History:  The patient's family history includes Cancer in his mother; Stroke in his father.  ROS:   Review of Systems  Constitutional: Positive for malaise/fatigue. Negative for chills, diaphoresis, fever and weight loss.  HENT: Negative for congestion.   Eyes: Negative for discharge and redness.  Respiratory: Negative for cough, hemoptysis, sputum production, shortness of breath and wheezing.   Cardiovascular: Positive for chest pain. Negative for palpitations, orthopnea, claudication, leg swelling and PND.  Gastrointestinal: Negative for abdominal pain, blood in stool, heartburn, melena, nausea and vomiting.  Genitourinary: Negative for hematuria.  Musculoskeletal: Negative for falls and myalgias.  Skin: Negative for rash.  Neurological: Positive for weakness. Negative for dizziness, tingling, tremors, sensory change, speech change, focal weakness and loss of consciousness.  Endo/Heme/Allergies: Does not bruise/bleed easily.  Psychiatric/Behavioral: Negative for substance abuse. The patient is not nervous/anxious.   All other systems reviewed and are negative.    PHYSICAL EXAM:  VS:  BP (!) 124/56 (BP Location: Left Arm, Patient Position: Sitting, Cuff Size: Normal)   Pulse (!) 51   Ht 5\' 8"  (1.727 m)   Wt 198 lb 8 oz (90 kg)   BMI 30.18 kg/m  BMI: Body mass index is 30.18 kg/m.  Physical Exam  Constitutional: He is oriented to person, place, and time. He appears  well-developed and well-nourished.  HENT:  Head: Normocephalic and atraumatic.  Eyes: Right eye exhibits no discharge. Left eye exhibits no discharge.  Neck: Normal range of motion. No JVD present.  Cardiovascular: Regular rhythm, S1 normal and S2 normal. Bradycardia present. Exam reveals no distant heart sounds, no friction rub, no midsystolic click and no opening snap.  Murmur heard.  Harsh midsystolic murmur is present with a grade of 1/6 at the upper right sternal border radiating to the neck. Pulses:      Posterior tibial pulses are 2+ on the right side and 2+ on the left side.  Pulmonary/Chest: Effort normal and breath sounds normal. No respiratory distress. He has no decreased breath sounds. He has no wheezes. He has no rales. He exhibits no tenderness.  Abdominal: Soft. He exhibits no distension. There is no abdominal tenderness.  Musculoskeletal:        General: No edema.  Neurological: He is alert and oriented to person, place, and time.  Skin: Skin is warm and dry. No cyanosis. Nails show no clubbing.  Psychiatric: He has a normal mood and affect. His speech is normal and behavior is normal. Judgment and thought content normal.     EKG:  Was ordered and interpreted by me today. Shows sinus bradycardia, 51 bpm, RBBB (known)  Recent Labs: 11/05/2017: ALT 16; Magnesium 2.0; TSH 3.42 12/30/2017: BUN 40; Creatinine, Ser 1.71; Hemoglobin 12.2; Platelets 160; Potassium 4.3; Sodium 140  11/05/2017: Cholesterol 120; HDL 28; LDL Cholesterol (Calc) 65; Total CHOL/HDL Ratio 4.3; Triglycerides 207   CrCl cannot be calculated (Patient's most recent lab result is older than the maximum 21 days allowed.).   Wt Readings from Last 3 Encounters:  03/14/18 198 lb 8 oz (90 kg)  02/06/18 196 lb (88.9 kg)  01/27/18 197 lb 4.8 oz (89.5 kg)     Other studies reviewed: Additional studies/records reviewed today include: summarized above  ASSESSMENT AND PLAN:  1. Chest pain with moderate risk for  cardiac etiology with prior noted nonobstructive CAD: Currently chest pain-free.  Symptoms are relatively atypical in that they occur with rest and last for 1 minute with spontaneous resolution.  Schedule Lexiscan Myoview to evaluate for high risk ischemia.  On Eliquis in place of aspirin.  Aggressive risk factor prevention.  2. Systolic dysfunction: This was felt to be related to the patient's recently diagnosed A. fib.  He appears well compensated and euvolemic on exam today.  Ischemic evaluation with [facility] as outlined above given his cardiomyopathy.  Now that he has maintained sinus rhythm following cardioversion, we will repeat echocardiogram to evaluate for improvement in LV systolic function.  Continue  Toprol-XL and lisinopril.  3. Persistent A. Fib: Status post recent DCCV on 01/01/2018.  Maintaining sinus rhythm with a bradycardic heart rate.  Patient has been taking the incorrect dose of Toprol-XL as outlined below.  We have sent a new prescription for Toprol-XL 25 mg half tab daily.  Continue amiodarone 200 mg daily.  Should his bradycardia persist, could consider decreasing amiodarone further to 100 mg daily.  Recent liver function, thyroid function, and CBC unrevealing.  Continue Eliquis 5 mg twice daily.  4. SVT: Status post RFCA.  Maintaining sinus rhythm with a bradycardic heart rate.  5. NSVT: Noted on recent outpatient cardiac monitoring.  Remains on Coreg and amiodarone as outlined above.  Ischemic evaluation as above.  Recent labs including magnesium, potassium, and thyroid function unrevealing.  6. Bradycardia: Patient has been taking Toprol-XL 50 mg half tab daily rather than the previously recommended dose of 25 mg half tab daily.  This was verified with his pharmacy today.  We have sent in a new prescription for Toprol-XL 25 mg half tab daily.  If needed, consider decreasing amiodarone to 100 mg daily in follow-up.  7. Severe aortic stenosis status post bovine AVR: Most  recent echo demonstrated stable bioprosthetic aortic valve replacement in 10/2017.  Patient is concerned for possible changes given recent development of chest discomfort.  In the setting, we will repeat echocardiogram.  8. Hypertension: Blood pressure is well controlled today.  Continue current medications outside of the newly decreased dose of Toprol-XL as outlined above.  9. Hyperlipidemia: LDL of 109 from 10/2017.  Remains on Crestor 10 mg daily.  10. CKD stage 3: Recently had a mildly increased BUN/serum creatinine in the setting of Lasix usage.  He has not used Lasix for several weeks.  Recheck BMP in follow-up.  11. Preoperative cardiovascular evaluation: Await ischemic and echocardiographic evaluation as above prior to providing formal risk stratification.  Should he decide to proceed with his hernia repair, he will need to hold Eliquis for 2 days prior to his procedure.  Disposition: F/u with Dr. Rockey Situ or an APP in 2 weeks.   Current medicines are reviewed at length with the patient today.  The patient did not have any concerns regarding medicines.  Signed, Christell Faith, PA-C 03/14/2018 11:29 AM     [place] [address] [place] [address] [place], [place] [address] [phone]

## 2018-03-14 ENCOUNTER — Ambulatory Visit (INDEPENDENT_AMBULATORY_CARE_PROVIDER_SITE_OTHER): Payer: Medicare Other | Admitting: Physician Assistant

## 2018-03-14 ENCOUNTER — Ambulatory Visit (INDEPENDENT_AMBULATORY_CARE_PROVIDER_SITE_OTHER): Payer: Medicare Other

## 2018-03-14 ENCOUNTER — Encounter: Payer: Self-pay | Admitting: Physician Assistant

## 2018-03-14 VITALS — BP 124/56 | HR 51 | Ht 68.0 in | Wt 198.5 lb

## 2018-03-14 DIAGNOSIS — E782 Mixed hyperlipidemia: Secondary | ICD-10-CM

## 2018-03-14 DIAGNOSIS — R079 Chest pain, unspecified: Secondary | ICD-10-CM

## 2018-03-14 DIAGNOSIS — I472 Ventricular tachycardia: Secondary | ICD-10-CM

## 2018-03-14 DIAGNOSIS — I4819 Other persistent atrial fibrillation: Secondary | ICD-10-CM

## 2018-03-14 DIAGNOSIS — N183 Chronic kidney disease, stage 3 unspecified: Secondary | ICD-10-CM

## 2018-03-14 DIAGNOSIS — I2510 Atherosclerotic heart disease of native coronary artery without angina pectoris: Secondary | ICD-10-CM

## 2018-03-14 DIAGNOSIS — I519 Heart disease, unspecified: Secondary | ICD-10-CM

## 2018-03-14 DIAGNOSIS — I471 Supraventricular tachycardia: Secondary | ICD-10-CM

## 2018-03-14 DIAGNOSIS — Z952 Presence of prosthetic heart valve: Secondary | ICD-10-CM

## 2018-03-14 DIAGNOSIS — I350 Nonrheumatic aortic (valve) stenosis: Secondary | ICD-10-CM

## 2018-03-14 DIAGNOSIS — I10 Essential (primary) hypertension: Secondary | ICD-10-CM

## 2018-03-14 DIAGNOSIS — I4729 Other ventricular tachycardia: Secondary | ICD-10-CM

## 2018-03-14 DIAGNOSIS — Z01810 Encounter for preprocedural cardiovascular examination: Secondary | ICD-10-CM

## 2018-03-14 DIAGNOSIS — R001 Bradycardia, unspecified: Secondary | ICD-10-CM

## 2018-03-14 LAB — ECHOCARDIOGRAM COMPLETE
Height: 68 in
Weight: 3176 oz

## 2018-03-14 MED ORDER — METOPROLOL SUCCINATE ER 25 MG PO TB24: 12.5000 mg | ORAL_TABLET | Freq: Every day | ORAL | 3 refills | Status: DC

## 2018-03-14 NOTE — Patient Instructions (Signed)
Medication Instructions:  Your physician has recommended you make the following change in your medication:  1- DECREASE Toprol to 0.5 tablet (12.5 mg total) once daily   If you need a refill on your cardiac medications before your next appointment, please call your pharmacy.   Lab work: None ordered  If you have labs (blood work) drawn today and your tests are completely normal, you will receive your results only by: Marland Kitchen MyChart Message (if you have MyChart) OR . A paper copy in the mail If you have any lab test that is abnormal or we need to change your treatment, we will call you to review the results.  Testing/Procedures: 1- Echo Your physician has requested that you have an echocardiogram. Echocardiography is a painless test that uses sound waves to create images of your heart. It provides your doctor with information about the size and shape of your heart and how well your heart's chambers and valves are working. This procedure takes approximately one hour. There are no restrictions for this procedure.  2- [place]  Your caregiver has ordered a Stress Test with nuclear imaging. The purpose of this test is to evaluate the blood supply to your heart muscle. This procedure is referred to as a "Non-Invasive Stress Test." This is because other than having an IV started in your vein, nothing is inserted or "invades" your body. Cardiac stress tests are done to find areas of poor blood flow to the heart by determining the extent of coronary artery disease (CAD). Some patients exercise on a treadmill, which naturally increases the blood flow to your heart, while others who are  unable to walk on a treadmill due to physical limitations have a pharmacologic/chemical stress agent called Lexiscan . This medicine will mimic walking on a treadmill by temporarily increasing your coronary blood flow.   Please note: these test may take anywhere between 2-4 hours to complete  PLEASE REPORT TO  [place] AT THE FIRST DESK WILL DIRECT YOU WHERE TO GO  Date of Procedure:_____________________________________  Arrival Time for Procedure:______________________________  Instructions regarding medication:   __x__ : Hold diabetes medication morning of procedure (glipizide, actos)  __x__:  Hold betablocker(s) night before procedure and morning of procedure  __x__:  Hold other medications as follows:______Do not take lasix the morning of your procedure.________________________________________________________________________________________  PLEASE NOTIFY THE OFFICE AT LEAST 24 HOURS IN ADVANCE IF YOU ARE UNABLE TO KEEP YOUR APPOINTMENT.  [phone] AND  PLEASE NOTIFY NUCLEAR MEDICINE AT [facility] AT LEAST 24 HOURS IN ADVANCE IF YOU ARE UNABLE TO KEEP YOUR APPOINTMENT. [phone]  How to prepare for your Myoview test:  1. Do not eat or drink after midnight 2. No caffeine for 24 hours prior to test 3. No smoking 24 hours prior to test. 4. Your medication may be taken with water.  If your doctor stopped a medication because of this test, do not take that medication. 5. Ladies, please do not wear dresses.  Skirts or pants are appropriate. Please wear a short sleeve shirt. 6. No perfume, cologne or lotion. 7. Wear comfortable walking shoes. No heels!    Follow-Up: At [facility], you and your health needs are our priority.  As part of our continuing mission to provide you with exceptional heart care, we have created designated Provider Care Teams.  These Care Teams include your primary Cardiologist (physician) and Advanced Practice Providers (APPs -  Physician Assistants and Nurse Practitioners) who all work together to  provide you with the care you need, when you need it. You will need a follow up appointment in 2 weeks. You may see Dr. Rockey Situ or Christell Faith, PA-C

## 2018-03-17 ENCOUNTER — Telehealth: Payer: Self-pay

## 2018-03-17 NOTE — Telephone Encounter (Signed)
-----   Message from Rise Mu, PA-C sent at 03/14/2018  5:02 PM EST ----- Echo showed normal pump function of 60 to 65%, slightly increased left ventricular wall with a slightly stiffened heart.  Mildly dilated left atrium.  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  Overall, this is a reassuring study.  Continue with planned ischemic evaluation with Lexiscan Myoview.

## 2018-03-17 NOTE — Telephone Encounter (Signed)
Attempted to call patient, no VM available to leave message. 03/17/2018

## 2018-03-18 ENCOUNTER — Ambulatory Visit
Admission: RE | Admit: 2018-03-18 | Discharge: 2018-03-18 | Disposition: A | Discharge status: Home / Self Care | Payer: Medicare Other | Source: Ambulatory Visit | Attending: Physician Assistant | Admitting: Physician Assistant

## 2018-03-18 DIAGNOSIS — R079 Chest pain, unspecified: Secondary | ICD-10-CM

## 2018-03-18 LAB — NM MYOCAR MULTI W/SPECT W/WALL MOTION / EF
LV dias vol: 124 mL (ref 62–150)
LV sys vol: 51 mL
MPHR: 141 {beats}/min
Peak HR: 78 {beats}/min
Percent HR: 55 %
Rest HR: 50 {beats}/min
TID: 0.86

## 2018-03-18 MED ORDER — TECHNETIUM TC 99M TETROFOSMIN IV KIT
10.0000 | PACK | Freq: Once | INTRAVENOUS | Status: AC | PRN
  Administered 2018-03-18: 10.55 via INTRAVENOUS

## 2018-03-18 MED ORDER — REGADENOSON 0.4 MG/5ML IV SOLN
0.4000 mg | Freq: Once | INTRAVENOUS | Status: AC
  Administered 2018-03-18: 0.4 mg via INTRAVENOUS

## 2018-03-18 MED ORDER — TECHNETIUM TC 99M TETROFOSMIN IV KIT
32.5500 | PACK | Freq: Once | INTRAVENOUS | Status: AC | PRN
  Administered 2018-03-18: 32.55 via INTRAVENOUS

## 2018-03-19 NOTE — Telephone Encounter (Signed)
No answer. Left message to call back.  Patient needs his echo and stress test results.     Stress test showed no significant ischemia or scar.  EF calculated as slightly reduced, felt to be secondary to GI uptake artifact.  Low risk stress test.   Recent stress test on 03/14/2018 showed normal EF.   Patient can proceed with noncardiac operation at an overall low risk without further cardiac testing needed.

## 2018-03-19 NOTE — Telephone Encounter (Signed)
Pt returned call to office. I gave him results of myoview and echo with notes from provider.  He verbalized understanding and had no further questions at this time.   No further orders.

## 2018-03-25 NOTE — Telephone Encounter (Signed)
If patient is continuing to feel well, appointment can be cancelled.

## 2018-03-25 NOTE — Telephone Encounter (Signed)
Patient calling  Patient has an appointment scheduled with R Dunn on 2/28 for a 2 week follow up Patient does not feel that appointment is necessary since all tests came back clear Would like to cancel Please advise

## 2018-03-25 NOTE — Telephone Encounter (Signed)
Do you advise for patient to keep appointment with you?

## 2018-03-25 NOTE — Telephone Encounter (Signed)
No answer. Left message to call back on two different numbers.  Third number was patient's wife. She will let patient know to call us back.

## 2018-03-28 ENCOUNTER — Ambulatory Visit: Payer: Medicare Other | Admitting: Physician Assistant

## 2018-03-31 ENCOUNTER — Telehealth: Payer: Self-pay | Admitting: Nurse Practitioner

## 2018-03-31 ENCOUNTER — Other Ambulatory Visit: Payer: Self-pay | Admitting: Cardiovascular Disease

## 2018-03-31 ENCOUNTER — Telehealth: Payer: Self-pay | Admitting: Family Medicine

## 2018-03-31 DIAGNOSIS — I10 Essential (primary) hypertension: Secondary | ICD-10-CM

## 2018-03-31 NOTE — Telephone Encounter (Signed)
Patient is due for routine appointment, please schedule appointment within the next month.

## 2018-03-31 NOTE — Telephone Encounter (Signed)
Mailbox is full, unable to leave message.

## 2018-03-31 NOTE — Telephone Encounter (Signed)
Refill Request.  

## 2018-04-01 NOTE — Telephone Encounter (Signed)
Will try again later. When calling it ring one time and then goes to vm in which I am not able to leave.

## 2018-04-01 NOTE — Telephone Encounter (Signed)
Patient was due to be seen in January (it is now March) He has no upcoming appointments with me Please contact him, schedule him for follow-up visit first available and we'll get labs then I'll approve refill

## 2018-04-01 NOTE — Telephone Encounter (Signed)
Tried again and this time it did ring, however vm picked up. Mailbox is full. Please schedule appt

## 2018-04-07 ENCOUNTER — Encounter
Admission: RE | Admit: 2018-04-07 | Discharge: 2018-04-07 | Disposition: A | Discharge status: Home / Self Care | Payer: Medicare Other | Source: Ambulatory Visit | Attending: Surgery | Admitting: Surgery

## 2018-04-07 ENCOUNTER — Ambulatory Visit (INDEPENDENT_AMBULATORY_CARE_PROVIDER_SITE_OTHER): Payer: BLUE CROSS/BLUE SHIELD | Admitting: Surgery

## 2018-04-07 ENCOUNTER — Encounter: Payer: Self-pay | Admitting: Surgery

## 2018-04-07 ENCOUNTER — Other Ambulatory Visit: Payer: Self-pay

## 2018-04-07 VITALS — BP 126/71 | HR 58 | Temp 97.7°F | Ht 68.0 in | Wt 199.6 lb

## 2018-04-07 DIAGNOSIS — K4090 Unilateral inguinal hernia, without obstruction or gangrene, not specified as recurrent: Secondary | ICD-10-CM

## 2018-04-07 DIAGNOSIS — Z01818 Encounter for other preprocedural examination: Secondary | ICD-10-CM | POA: Insufficient documentation

## 2018-04-07 NOTE — Patient Instructions (Addendum)
Patient is scheduled for surgery with Dr.Pabon on 04/15/2018 for hernia repair.  Call the office with any questions or concerns.

## 2018-04-07 NOTE — Progress Notes (Signed)
Outpatient Surgical Follow Up  04/07/2018  Timothy Riley is an 78 y.o. male.   Chief Complaint  Patient presents with  . Follow-up     pre-op robotic RIH repair scheduled for 04-15-18    HPI: Timothy Riley is a 78 y.o. male known for a right inguinal hernia that is mildy symptomtic.    He is able to perform more than 4 METS of activity without any shortness of breath or chest pain.  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  A. fib and has been on anticoagulation.  Has been seen by Dr. Rockey Situ was cardioverted and echo was performed showing no new areas of concerns. He has been optimized by cardiology. Over the last few weeks he has experienced some intermittent chest discomfort but all his cardiac w/u has been negative.  Echocardiogram showing preserved ejection fraction.  HE is on eliquis  Past Medical History:  Diagnosis Date  . Aortic stenosis    a. 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 Glower MD @ [facility] via R thoracic approach.  . Atrial fibrillation ([place]) 10/2017   Dr Rockey Situ  . CKD (chronic kidney disease) stage 3, GFR 30-59 ml/min (HCC) 06/17/2014  . Diabetes mellitus without complication ([place])   . Hypertension   . Non-obstructive CAD (coronary artery disease)    a. 12/2013 Cath ([facility]): LAD 30p, RI 60, otw nl (per CT surgery note from [facility]).  Marland Kitchen PSVT (paroxysmal supraventricular tachycardia) ([place])    a. 05/2014 s/p RFCA @ [facility].  Marland Kitchen Urinary incontinence     Past Surgical History:  Procedure Laterality Date  . AORTIC VALVE REPLACEMENT    . CARDIAC CATHETERIZATION    . CARDIOVERSION N/A 01/01/2018   Procedure: CARDIOVERSION;  Surgeon: Minna Merritts, MD;  Location: [facility] ORS;  Service: Cardiovascular;  Laterality: N/A;  . COLONOSCOPY  2010  . heart valve replacment  2015    Family History  Problem Relation Age of Onset  . Cancer Mother   . Stroke Father     Social History:  reports that he quit  smoking about 46 years ago. His smoking use included cigarettes. He has a 0.50 pack-year smoking history. He has never used smokeless tobacco. He reports that he does not drink alcohol or use drugs.  Allergies:  Allergies  Allergen Reactions  . Gabapentin     dizzy  . Hydralazine Hcl Rash    Medications reviewed.    ROS Full ROS performed and is otherwise negative other than what is stated in HPI   BP 126/71   Pulse (!) 58   Temp 97.7 F (36.5 C) (Temporal)   Ht 5\' 8"  (1.727 m)   Wt 199 lb 9.6 oz (90.5 kg)   SpO2 98%   BMI 30.35 kg/m   Physical Exam Constitutional:      General: He is not in acute distress.    Appearance: He is normal weight.  HENT:     Head: Normocephalic.  Eyes:     General: No scleral icterus.       Right eye: No discharge.        Left eye: No discharge.     Extraocular Movements: Extraocular movements intact.  Neck:     Musculoskeletal: Normal range of motion and neck supple. No neck rigidity or muscular tenderness.     Vascular: No carotid bruit.  Cardiovascular:     Rate and Rhythm: Normal rate and regular rhythm.  Pulses: Normal pulses.     Heart sounds: No murmur. No friction rub.  Pulmonary:     Effort: Pulmonary effort is normal. No respiratory distress.     Breath sounds: Normal breath sounds. No stridor.  Abdominal:     General: Abdomen is flat. There is no distension.     Palpations: Abdomen is soft. There is no mass.     Tenderness: There is no abdominal tenderness. There is no guarding or rebound.     Hernia: A hernia is present.     Comments: Large RIH reducible  Skin:    General: Skin is warm and dry.     Capillary Refill: Capillary refill takes less than 2 seconds.     Coloration: Skin is not jaundiced.  Neurological:     General: No focal deficit present.     Mental Status: He is alert and oriented to person, place, and time.  Psychiatric:        Mood and Affect: Mood normal.        Behavior: Behavior normal.         Thought Content: Thought content normal.        Judgment: Judgment normal.       Assessment/Plan: 78 year old male with a right symptomatic inguinal hernia.  He has been cleared by cardiology.  Discussed with the patient in detail again about the proposed robotic assisted inguinal hernia repair.  Risks, benefits and possible complications including but not limited to: Bleeding, infection, recurrence, pain, re-interventions, bowel injury also discussed the chance of bilateral disease requiring repair during same setting. He knows to stop eliquis 2 days before Greater than 50% of the 25 minutes  visit was spent in counseling/coordination of care   Caroleen Hamman, MD [place] [facility]

## 2018-04-07 NOTE — Patient Instructions (Signed)
Your procedure is scheduled on: Tuesday 04/15/18 Report to [place]. To find out your arrival time please call [phone] between 1PM - 3PM on Monday 04/14/18.  Remember: Instructions that are not followed completely may result in serious medical risk, up to and including death, or upon the discretion of your surgeon and anesthesiologist your surgery may need to be rescheduled.     _X__ 1. Do not eat food after midnight the night before your procedure.                 No gum chewing or hard candies. You may drink clear liquids up to 2 hours                 before you are scheduled to arrive for your surgery- DO not drink clear                 liquids within 2 hours of the start of your surgery.                 Clear Liquids include:  water, apple juice without pulp, clear carbohydrate                 drink such as Clearfast or Gatorade, Black Coffee or Tea (Do not add                 anything to coffee or tea).  __X__2.  On the morning of surgery brush your teeth with toothpaste and water, you                 may rinse your mouth with mouthwash if you wish.  Do not swallow any              toothpaste of mouthwash.     _X__ 3.  No Alcohol for 24 hours before or after surgery.   _X__ 4.  Do Not Smoke or use e-cigarettes For 24 Hours Prior to Your Surgery.                 Do not use any chewable tobacco products for at least 6 hours prior to                 surgery.  ____  5.  Bring all medications with you on the day of surgery if instructed.   __X__  6.  Notify your doctor if there is any change in your medical condition      (cold, fever, infections).     Do not wear jewelry, make-up, hairpins, clips or nail polish. Do not wear lotions, powders, or perfumes.  Do not shave 48 hours prior to surgery. Men may shave face and neck. Do not bring valuables to the hospital.    [facility] is not responsible for any belongings or  valuables.  Contacts, dentures/partials or body piercings may not be worn into surgery. Bring a case for your contacts, glasses or hearing aids, a denture cup will be supplied. Leave your suitcase in the car. After surgery it may be brought to your room. For patients admitted to the hospital, discharge time is determined by your treatment team.   Patients discharged the day of surgery will not be allowed to drive home.   Please read over the following fact sheets that you were given:   MRSA Information  __X__ Take these medicines the morning of surgery with A SIP OF WATER:  1. amiodarone (PACERONE)  2. amLODipine (NORVASC  3. rosuvastatin (CRESTOR  4.  5.  6. Take your morning blood pressure and heart medications you normally take in the morning except for lisinopril if that is a morning med.  ____ Fleet Enema (as directed)   __X__ Use CHG Soap/SAGE wipes as directed  ____ Use inhalers on the day of surgery  ____ Stop metformin/Janumet/Farxiga 2 days prior to surgery    ____ Take 1/2 of usual insulin dose the night before surgery. No insulin the morning          of surgery.   __X__ Stop Blood Thinners Coumadin/Plavix/Xarelto/Pleta/Pradaxa/Eliquis/Effient/Aspirin as instructed...  Or contact your Surgeon, Cardiologist or Medical Doctor regarding  ability to stop your blood thinners  __X__ Stop Anti-inflammatories 7 days before surgery such as Advil, Ibuprofen, Motrin,  BC or Goodies Powder, Naprosyn, Naproxen, Aleve, Aspirin    __X__ Stop all herbal supplements, fish oil or vitamin E until after surgery. Tumeric   ____ Bring C-Pap to the hospital.

## 2018-04-08 ENCOUNTER — Other Ambulatory Visit: Payer: Self-pay | Admitting: Nurse Practitioner

## 2018-04-08 ENCOUNTER — Telehealth: Payer: Self-pay | Admitting: Family Medicine

## 2018-04-08 NOTE — Telephone Encounter (Signed)
Copied from [place] [id number]. Topic: Quick Communication - See Telephone Encounter >> Apr 08, 2018 10:16 AM Ahmed Prima L wrote: CRM for notification. See Telephone encounter for: 04/08/18.  Patient is scheduled for a follow up with Dr Sanda Klein for med refills for April 2nd, 2020. He said that he is going to have hernia surgery on Tuesday, March 17th and will be out of commission for 2 weeks. He said he has several medications that he will need and wants to make sure they will not be denied as he will need that. I asked him to have the pharmacy send the request to the office.

## 2018-04-08 NOTE — Telephone Encounter (Signed)
Patient needs an appt ASAP Please see my note from March 2nd He was due to be seen in January If I do not have an opening, offer appt with Benjamine Mola

## 2018-04-08 NOTE — Telephone Encounter (Signed)
Tried to contact pt mailbox full. Will try again later today

## 2018-04-09 NOTE — Telephone Encounter (Signed)
I'm sorry if I did not make my request clear He needs an appt ASAP He already had an appt for May 01, 2018 per the note from [place] on March 10th I don't want him to wait three more weeks to be seen

## 2018-04-09 NOTE — Telephone Encounter (Signed)
Just spoke with patient and he is not able to come in any sooner. Stated that he has to prepare for hernia surgery on Tuesday. I offered him an appt with Benjamine Mola and he also refused as well.

## 2018-04-09 NOTE — Telephone Encounter (Signed)
appt made for 4.2.2020 with you

## 2018-04-14 ENCOUNTER — Telehealth: Payer: Self-pay

## 2018-04-14 ENCOUNTER — Telehealth: Payer: Self-pay | Admitting: Cardiovascular Disease

## 2018-04-14 NOTE — Telephone Encounter (Signed)
Pt c/o medication issue:  1. Name of Medication: Eliquis   2. How are you currently taking this medication (dosage and times per day)? Holding for sx  3. Are you having a reaction (difficulty breathing--STAT)? no  4. What is your medication issue? Patient surgery cancelled and he wants to clarify restart of med today   Please call. If no answer please leave detailed vm

## 2018-04-14 NOTE — Telephone Encounter (Signed)
The patient has called to reschedule their surgery to a latter date due to the COVID virus. He is now rescheduled to 05/20/18 at [facility] with Dr Dahlia Byes. He is aware to call the day prior to get his arrival time.

## 2018-04-14 NOTE — Telephone Encounter (Signed)
Call to patient to restart Eliquis today. Can take as previously written. Left detailed message on machine as requested by patient.   Encouraged patient to call back with further questions or concerns.   Encounter completed at this time.

## 2018-04-17 ENCOUNTER — Other Ambulatory Visit: Payer: Self-pay | Admitting: Cardiovascular Disease

## 2018-04-29 ENCOUNTER — Other Ambulatory Visit: Payer: Self-pay | Admitting: Family Medicine

## 2018-04-29 NOTE — Telephone Encounter (Signed)
Last echo: "Echo showed normal pump function of 60 to 65%"  Rx approved

## 2018-05-01 ENCOUNTER — Encounter: Payer: Self-pay | Admitting: Family Medicine

## 2018-05-01 ENCOUNTER — Ambulatory Visit (INDEPENDENT_AMBULATORY_CARE_PROVIDER_SITE_OTHER): Payer: Medicare Other | Admitting: Family Medicine

## 2018-05-01 DIAGNOSIS — E118 Type 2 diabetes mellitus with unspecified complications: Secondary | ICD-10-CM

## 2018-05-01 DIAGNOSIS — E782 Mixed hyperlipidemia: Secondary | ICD-10-CM | POA: Diagnosis not present

## 2018-05-01 DIAGNOSIS — I480 Paroxysmal atrial fibrillation: Secondary | ICD-10-CM

## 2018-05-01 DIAGNOSIS — I10 Essential (primary) hypertension: Secondary | ICD-10-CM

## 2018-05-01 NOTE — Assessment & Plan Note (Signed)
Controlled right now; not adding salt

## 2018-05-01 NOTE — Assessment & Plan Note (Signed)
reviwed last lipids; patient opts to not have labs drawn at this time in the office due to COVID-19 pandemic; we'll recheck in 3-6 months; advised limiting the bacon and sausage; continue statin

## 2018-05-01 NOTE — Assessment & Plan Note (Signed)
Managed by cardiologist 

## 2018-05-01 NOTE — Assessment & Plan Note (Addendum)
Last A1c was just under 6 months ago; due for recheck but concerns for patients with diabetes and other conditions that put them at high risk for complications should they contract COVID-19 discussed; offered lab testing alone in the office versus staff coming out to his car window and doing fingerstick A1c; patient prefers to come to the parking lot a little after 11:00 am on Monday May 05, 2018 and he'll call the office to let them know he has arrived and staff can go out and do fingerstick A1c (POCT) for his diabetes

## 2018-05-01 NOTE — Progress Notes (Signed)
BP 111/65   Pulse 68    Subjective:    Patient ID: Timothy Riley, male    DOB: 1940/11/13, 78 y.o.   MRN: [id number]  HPI: Timothy Riley is a 78 y.o. male  Chief Complaint  Patient presents with  . Follow-up  . Diabetes    138     HPI Virtual Visit via Telephone Note   I connected with Timothy Riley on May 01, 2018 at 3:00 pm EDT by telephone and verified that I am speaking with the correct person using two identifiers.   Staff discussed the limitations, risks, security, and privacy concerns of performing an evaluation and management service by telephone and the availability of in-person appointments. Staff discussed with the patient that he/she may be responsible for charges related to this service. The patient expressed understanding and agreed to proceed. The patient was asked to check her surroundings, be sure to keep phone off of speaker, let me know immediately if someone comes near and our conversation has to be terminated or paused for privacy concerns.  Additional participants: none  Patient location: home Provider location: home  Call started: 3:00 pm Call terminated: 3:12 pm Total length of call: 12 minutes  Issues discussed:  Diabetes mellitus; he checked FSBS this morning 138; no dry mouth, no blurred vision; tries to watch his diet  Lab Results  Component Value Date   HGBA1C 7.1 (A) 11/05/2017   HGBA1C 7.1 11/05/2017   HGBA1C 7.1 (A) 11/05/2017   HGBA1C 7.1 (A) 11/05/2017   He had the surgery canceled march 17th and they moved it back to later this month, April 21st and he thinks  BP 111/65 this morning; he feels great overall; played golf last week; does not add salt to his food  High cholesterol; bacon and grits and eggs and coffee and toast; normally he gets a biscuit and cup of coffee; eating out less because of the pandemic right now; he doesn't cook much any more; easier to go to fast food  Atrial fibrillation; he has had lots of tests and  blood drawn; Dr. Rockey Situ is his cardiologist; he has had nuclear testing and an echocardiogram  Depression screen [facility] 2/9 05/01/2018 01/27/2018 11/05/2017 01/11/2017 06/29/2016  Decreased Interest 0 0 0 0 0  Down, Depressed, Hopeless 0 0 0 0 1  PHQ - 2 Score 0 0 0 0 1  Altered sleeping 0 1 0 - -  Tired, decreased energy 0 3 0 - -  Change in appetite 0 0 0 - -  Feeling bad or failure about yourself  0 0 0 - -  Trouble concentrating 0 1 0 - -  Moving slowly or fidgety/restless 0 0 0 - -  Suicidal thoughts 0 0 0 - -  PHQ-9 Score 0 5 0 - -  Difficult doing work/chores Not difficult at all Not difficult at all Not difficult at all - -   Fall Risk  05/01/2018 04/07/2018 01/27/2018 12/04/2017 11/18/2017  Falls in the past year? 0 0 0 0 No  Number falls in past yr: 0 - 0 - -  Injury with Fall? 0 0 0 - -  Follow up - - - Falls evaluation completed -    Relevant past medical, surgical, family and social history reviewed Past Medical History:  Diagnosis Date  . Aortic stenosis    a. 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 Glower MD @ [facility] via R thoracic approach.  Marland Kitchen  Atrial fibrillation ([place]) 10/2017   Dr Rockey Situ  . CKD (chronic kidney disease) stage 3, GFR 30-59 ml/min (HCC) 06/17/2014  . Diabetes mellitus without complication ([place])   . Hypertension   . Non-obstructive CAD (coronary artery disease)    a. 12/2013 Cath ([facility]): LAD 30p, RI 60, otw nl (per CT surgery [facility]).  Marland Kitchen PSVT (paroxysmal supraventricular tachycardia) ([place])    a. 05/2014 s/p RFCA @ [facility].  Marland Kitchen Urinary incontinence    Past Surgical History:  Procedure Laterality Date  . AORTIC VALVE REPLACEMENT    . CARDIAC CATHETERIZATION    . CARDIOVERSION N/A 01/01/2018   Procedure: CARDIOVERSION;  Surgeon: Minna Merritts, MD;  Location: [facility] ORS;  Service: Cardiovascular;  Laterality: N/A;  . COLONOSCOPY  2010  . heart valve replacment  2015   Family History  Problem Relation Age of Onset  .  Cancer Mother   . Stroke Father    Social History   Tobacco Use  . Smoking status: Former Smoker    Packs/day: 0.50    Years: 1.00    Pack years: 0.50    Types: Cigarettes    Last attempt to quit: 01/30/1972    Years since quitting: 46.2  . Smokeless tobacco: Never Used  . Tobacco comment: over 45 yrs ago  Substance Use Topics  . Alcohol use: No    Alcohol/week: 0.0 standard drinks  . Drug use: No     Office Visit from 05/01/2018 in [facility]  AUDIT-C Score  0      Interim medical history since last visit reviewed. Allergies and medications reviewed  Review of Systems Per HPI unless specifically indicated above     Objective:    BP 111/65   Pulse 68   Wt Readings from Last 3 Encounters:  04/07/18 200 lb (90.7 kg)  04/07/18 199 lb 9.6 oz (90.5 kg)  03/14/18 198 lb 8 oz (90 kg)    Physical Exam Pulmonary:     Effort: No respiratory distress.  Neurological:     Mental Status: He is alert.  Psychiatric:        Speech: Speech is not rapid and pressured, delayed or slurred.    Results for orders placed or performed during the hospital encounter of 03/18/18  NM Myocar Multi W/Spect W/Wall Motion / EF  Result Value Ref Range   Rest HR 50 bpm   Rest BP 152/67 mmHg   Percent HR 55 %   Peak HR 78 bpm   Peak BP 140/69 mmHg   MPHR 141 bpm   TID 0.86    LV sys vol 51 mL   LV dias vol 124 62 - 150 mL      Assessment & Plan:   Problem List Items Addressed This Visit      Cardiovascular and Mediastinum   Hypertension goal BP (blood pressure) < 140/90    Controlled right now; not adding salt      Atrial fibrillation ([place])    Managed by cardiologist        Endocrine   Type 2 diabetes mellitus with complication, without long-term current use of insulin (HCC)    Last A1c was just under 6 months ago; due for recheck but concerns for patients with diabetes and other conditions that put them at high risk for complications should they contract  COVID-19 discussed; offered lab testing alone in the office versus staff coming out to his car window and doing fingerstick A1c; patient prefers  to come to the parking lot a little after 11:00 am on Monday May 05, 2018 and he'll call the office to let them know he has arrived and staff can go out and do fingerstick A1c (POCT) for his diabetes        Other   Hyperlipidemia    reviwed last lipids; patient opts to not have labs drawn at this time in the office due to COVID-19 pandemic; we'll recheck in 3-6 months; advised limiting the bacon and sausage; continue statin          Follow up plan: No follow-ups on file.  An after-visit summary was printed and given to the patient at [place].  Please see the patient instructions which may contain other information and recommendations beyond what is mentioned above in the assessment and plan.  No orders of the defined types were placed in this encounter.   No orders of the defined types were placed in this encounter.

## 2018-05-07 ENCOUNTER — Telehealth: Payer: Self-pay | Admitting: Family Medicine

## 2018-05-07 NOTE — Telephone Encounter (Signed)
If he does not have a history of prostate cancer, I do not recommend that he have another PSA done at this time His last PSA was normal Most urologists are not continuing to check PSAs any more on gentlemen his age If he would like to be referred to a urologist for monitoring or discussion, we can certainly arrange that, but the concensus now is to stop screening for prostate cancer at his age

## 2018-05-07 NOTE — Telephone Encounter (Signed)
Pt is saying he was needing to get his PSA checked and need orders put in . Says he thinks that his surgery will be postponed again due to the covid 19

## 2018-05-08 NOTE — Telephone Encounter (Signed)
Left detailed voicemail

## 2018-05-12 ENCOUNTER — Telehealth: Payer: Self-pay | Admitting: *Deleted

## 2018-05-12 NOTE — Telephone Encounter (Signed)
Message left for patient to call the office.   Surgery that was scheduled for 05-20-18 with Dr. Dahlia Byes will be cancelled due to COVID-19 restrictions.   Patient will be contacted once restrictions are lifted in regards to rescheduling.

## 2018-05-12 NOTE — Telephone Encounter (Signed)
Patient called the office back and notified per previous message.   He verbalizes understanding.

## 2018-05-20 ENCOUNTER — Encounter: Admission: RE | Payer: Self-pay | Source: Home / Self Care

## 2018-05-20 ENCOUNTER — Ambulatory Visit: Admission: RE | Admit: 2018-05-20 | Payer: Medicare Other | Source: Home / Self Care | Admitting: Surgery

## 2018-05-20 SURGERY — ROBOT ASSISTED INGUINAL HERNIA REPAIR
Anesthesia: Choice | Laterality: Right

## 2018-06-03 ENCOUNTER — Other Ambulatory Visit: Payer: Self-pay | Admitting: Nurse Practitioner

## 2018-06-11 ENCOUNTER — Other Ambulatory Visit: Payer: Self-pay | Admitting: Cardiovascular Disease

## 2018-06-13 ENCOUNTER — Telehealth: Payer: Self-pay | Admitting: *Deleted

## 2018-06-13 NOTE — Telephone Encounter (Signed)
Left message to call us back to schedule surgery

## 2018-06-24 ENCOUNTER — Other Ambulatory Visit: Payer: Self-pay | Admitting: Family Medicine

## 2018-06-24 ENCOUNTER — Other Ambulatory Visit: Payer: Self-pay | Admitting: Nurse Practitioner

## 2018-06-24 DIAGNOSIS — E119 Type 2 diabetes mellitus without complications: Secondary | ICD-10-CM

## 2018-06-24 DIAGNOSIS — I10 Essential (primary) hypertension: Secondary | ICD-10-CM

## 2018-07-04 ENCOUNTER — Other Ambulatory Visit: Payer: Self-pay | Admitting: Cardiovascular Disease

## 2018-07-07 NOTE — Telephone Encounter (Signed)
Dr. Saunders Revel (DOD) 12/19/199 documentation Message was routed to MD End, DOD.    His reply is as follows,  "Recommend decreasing amiodarone to 200 mg daily and metoprolol succinate to 25 mg daily. Further recommendations per Dr. Rockey Situ when he returns.  Nelva Bush, MD

## 2018-07-07 NOTE — Telephone Encounter (Signed)
Please advise if patient should be taking Amiodarone 200 mg twice a day or Amiodarone 200 mg one tablet daily.

## 2018-07-15 ENCOUNTER — Telehealth: Payer: Self-pay

## 2018-07-15 ENCOUNTER — Other Ambulatory Visit: Payer: Self-pay | Admitting: Cardiovascular Disease

## 2018-07-15 NOTE — Telephone Encounter (Signed)
Please advise if ok to refill doxazosin 8 mg tablet qd.

## 2018-07-15 NOTE — Telephone Encounter (Signed)
Call attempted to schedule F/U appt with Dr. Rockey Situ or APP. Line busy.

## 2018-07-21 ENCOUNTER — Telehealth: Payer: Self-pay | Admitting: *Deleted

## 2018-07-21 NOTE — Telephone Encounter (Signed)
Contacted patient today about rescheduling robotic right inguinal hernia repair. Surgery was previously postponed due to COVID-19.   The patient states he does not want to reschedule surgery "until all this mess is over."  Patient wishes to contact our office if he would like to reschedule in the future.   Note routed to Dr. Dahlia Byes.

## 2018-07-23 ENCOUNTER — Other Ambulatory Visit: Payer: Self-pay | Admitting: Nurse Practitioner

## 2018-07-23 DIAGNOSIS — E119 Type 2 diabetes mellitus without complications: Secondary | ICD-10-CM

## 2018-07-23 NOTE — Telephone Encounter (Signed)
Lvm to schedle appt for follow up and meds have been called in

## 2018-07-23 NOTE — Telephone Encounter (Signed)
Please schedule a 3 month follow-up routine

## 2018-08-08 ENCOUNTER — Other Ambulatory Visit: Payer: Self-pay | Admitting: Cardiovascular Disease

## 2018-08-08 DIAGNOSIS — I10 Essential (primary) hypertension: Secondary | ICD-10-CM

## 2018-08-14 ENCOUNTER — Encounter: Payer: Self-pay | Admitting: Nurse Practitioner

## 2018-08-14 ENCOUNTER — Ambulatory Visit (INDEPENDENT_AMBULATORY_CARE_PROVIDER_SITE_OTHER): Payer: Medicare Other | Admitting: Nurse Practitioner

## 2018-08-14 ENCOUNTER — Other Ambulatory Visit: Payer: Self-pay

## 2018-08-14 VITALS — BP 128/60 | HR 55 | Ht 68.0 in | Wt 204.8 lb

## 2018-08-14 DIAGNOSIS — N183 Chronic kidney disease, stage 3 unspecified: Secondary | ICD-10-CM

## 2018-08-14 DIAGNOSIS — Z953 Presence of xenogenic heart valve: Secondary | ICD-10-CM

## 2018-08-14 DIAGNOSIS — I10 Essential (primary) hypertension: Secondary | ICD-10-CM | POA: Diagnosis not present

## 2018-08-14 DIAGNOSIS — R400 Somnolence: Secondary | ICD-10-CM

## 2018-08-14 DIAGNOSIS — I4819 Other persistent atrial fibrillation: Secondary | ICD-10-CM

## 2018-08-14 DIAGNOSIS — T148XXA Other injury of unspecified body region, initial encounter: Secondary | ICD-10-CM

## 2018-08-14 DIAGNOSIS — E782 Mixed hyperlipidemia: Secondary | ICD-10-CM

## 2018-08-14 DIAGNOSIS — I2510 Atherosclerotic heart disease of native coronary artery without angina pectoris: Secondary | ICD-10-CM

## 2018-08-14 NOTE — Progress Notes (Signed)
Office Visit    Patient Name: Timothy Riley Date of Encounter: 08/14/2018  Primary Care Provider:  Arnetha Courser, MD Primary Cardiologist:  Ida Rogue, MD  Chief Complaint    78 year old male with history of aortic stenosis status post aortic valve replacement with pericardial tissue valve at [facility] in 2016, hypertension, supraventricular tachycardia status post catheter ablation, diabetes, persistent atrial fibrillation on amiodarone and Eliquis, and stage III chronic kidney disease, who presents for follow-up related to bruising.  Past Medical History    Past Medical History:  Diagnosis Date  . Aortic stenosis    a. 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 Glower MD @ [facility] via R thoracic approach; b. 03/2018 Echo: EF 60-65%, DD. Mildly dil LA. Nl fxn AoV prosthesis. Grad 35mmHg.  . CKD (chronic kidney disease) stage 3, GFR 30-59 ml/min (HCC) 06/17/2014  . Diabetes mellitus without complication ([place])   . Hypertension   . Non-obstructive CAD (coronary artery disease)    a. 12/2013 Cath ([facility]): LAD 30p, RI 60, otw nl (per CT surgery note from [place]); b. 03/2018 MV: EF 59%, no ischemia/scar.  . Persistent atrial fibrillation 10/2017   a. CHA2DS2VASc = 5-->Eliquis; b. 12/2017 loaded w/amio-->DCCV.  Marland Kitchen PSVT (paroxysmal supraventricular tachycardia) ([place])    a. 05/2014 s/p RFCA @ [facility].  . Right inguinal hernia   . Urinary incontinence    Past Surgical History:  Procedure Laterality Date  . AORTIC VALVE REPLACEMENT    . CARDIAC CATHETERIZATION    . CARDIOVERSION N/A 01/01/2018   Procedure: CARDIOVERSION;  Surgeon: Minna Merritts, MD;  Location: [facility] ORS;  Service: Cardiovascular;  Laterality: N/A;  . COLONOSCOPY  2010  . heart valve replacment  2015    Allergies  Allergies  Allergen Reactions  . Gabapentin     dizzy  . Hydralazine Hcl Rash    History of Present Illness    78 year old male with above past medical history including  severe aortic stenosis status post minimally invasive aortic valve replacement with a 23 mm Carpentier-Edwards pericardial tissue valve at [facility] in January 2016.  Postoperative course was complicated by paroxysmal supraventricular tachycardia and this was subsequently treated with radiofrequency catheter ablation in May 2016.  Other history includes hypertension, hyperlipidemia, diabetes, nonobstructive CAD, stage III chronic kidney disease, and persistent atrial fibrillation which was diagnosed in October 2019, at which time he was placed on Eliquis and subsequently amiodarone after outpatient monitoring showed persistent A. fib with 100% burden and rates between 58 and 152.  Following adequate amiodarone loading, he underwent cardioversion x2 in December 2019 and has been maintaining sinus rhythm since.  Timothy Riley was last seen in clinic in February, at which time he reported somewhat atypical chest discomfort.  Stress testing was undertaken and was nonischemic.  Echocardiogram showed normal LV function with properly functioning bioprosthetic valve with a mean gradient of 16 mmHg across the valve.  At the same time, he was being evaluated for right inguinal hernia repair and he was cleared for surgery however, surgery was canceled twice in the setting of COVID-19.  This surgery has been tentatively rescheduled for late October, as patient prefers to continue to delay this as long as possible in the setting of the ongoing pandemic and rising hospitalizations.  From a cardiac standpoint, he has done well.  He is somewhat active in and around his home and is able to perform activities without chest pain or dyspnea.  He has not  had any palpitations and to the best of his knowledge, he has been maintaining sinus rhythm.  His biggest complaint today is bruising.  He notes that he bruises very easily with minimal provocation.  This is typically on his arms as he is frequently doing work around his house.  He has not  had any bleeding and denies melena or bright red blood per rectum.  He has not sustained any significant traumas and does not fall.  He is wondering if he can reduce his Eliquis dose and we had a long discussion today about how Eliquis is dosed and when it might be appropriate to reduce that dose.  He denies PND, orthopnea, dizziness, syncope, edema, or early satiety.  He has noted some daytime somnolence, especially if he has been real busy in the morning and then has to take a nap in the afternoon.  Upon further questioning, he notes that he does not sleep well at night, typically awakening every hour to 2 hours and sometimes only sleeping a total of 4 hours.  He is not interested in a sleep study.  Home Medications    Prior to Admission medications   Medication Sig Start Date End Date Taking? Authorizing Provider  amiodarone (PACERONE) 200 MG tablet Take 1 tablet (200 mg total) by mouth daily. 07/07/18  Yes Minna Merritts, MD  amLODipine (NORVASC) 10 MG tablet TAKE 1 TABLET BY MOUTH ONCE DAILY 06/24/18  Yes Poulose, Bethel Born, NP  doxazosin (CARDURA) 8 MG tablet TAKE 1 TABLET BY MOUTH EVERY NIGHT AT BEDTIME 07/15/18  Yes Gollan, Kathlene November, MD  ELIQUIS 5 MG TABS tablet TAKE 1 TABLET BY MOUTH TWICE (2) DAILY Patient taking differently: Take 5 mg by mouth 2 (two) times daily.  03/31/18  Yes Gollan, Kathlene November, MD  furosemide (LASIX) 20 MG tablet Take 1 tablet (20 mg total) by mouth daily as needed. 11/21/17  Yes Gollan, Kathlene November, MD  glipiZIDE (GLUCOTROL) 10 MG tablet TAKE 1 TABLET BY MOUTH TWICE A DAY BEFORE A MEAL. 07/23/18  Yes Poulose, Bethel Born, NP  lisinopril (ZESTRIL) 40 MG tablet TAKE 1 TABLET BY MOUTH ONCE DAILY 08/08/18  Yes Gollan, Kathlene November, MD  metoprolol succinate (TOPROL-XL) 25 MG 24 hr tablet Take 0.5 tablets (12.5 mg total) by mouth daily. Patient taking differently: Take 6.25 mg by mouth at bedtime. Pt is only taking 1/4 of a tablet 03/14/18  Yes Dunn, Ryan M, PA-C  pioglitazone  (ACTOS) 30 MG tablet TAKE 1 TABLET BY MOUTH ONCE A DAY 04/29/18  Yes Lada, Satira Anis, MD  rosuvastatin (CRESTOR) 10 MG tablet TAKE 1 TABLET BY MOUTH DAILY NEED APPT FOR FURTHER REFILLS 06/03/18  Yes Poulose, Bethel Born, NP    Review of Systems    Some daytime somnolence/fatigue as well as upper extremity bruising.  He denies chest pain, palpitations, dyspnea, PND, orthopnea, dizziness, syncope, edema, or early satiety.  All other systems reviewed and are otherwise negative except as noted above.  Physical Exam    VS:  BP 128/60 (BP Location: Left Arm, Patient Position: Sitting, Cuff Size: Normal)   Pulse (!) 55   Ht 5\' 8"  (1.727 m)   Wt 204 lb 12 oz (92.9 kg)   BMI 31.13 kg/m  , BMI Body mass index is 31.13 kg/m. GEN: Well nourished, well developed, in no acute distress. HEENT: normal. Neck: Supple, no JVD, carotid bruits, or masses. Cardiac: RRR, 2/6 systolic ejection murmur at the upper sternal borders, no rubs, or gallops.  No clubbing, cyanosis, edema.  Radials/DP/PT 2+ and equal bilaterally.  Respiratory:  Respirations regular and unlabored, clear to auscultation bilaterally. GI: Soft, nontender, nondistended, BS + x 4. MS: no deformity or atrophy. Skin: warm and dry, no rash. Neuro:  Strength and sensation are intact. Psych: Normal affect.  Accessory Clinical Findings    ECG personally reviewed by me today -sinus bradycardia, 55, right bundle branch block- no acute changes.  Lab Results  Component Value Date   WBC 5.1 12/30/2017   HGB 12.2 (L) 12/30/2017   HCT 36.8 (L) 12/30/2017   MCV 90.0 12/30/2017   PLT 160 12/30/2017   Lab Results  Component Value Date   CREATININE 1.71 (H) 12/30/2017   BUN 40 (H) 12/30/2017   NA 140 12/30/2017   K 4.3 12/30/2017   CL 105 12/30/2017   CO2 27 12/30/2017   Lab Results  Component Value Date   ALT 16 11/05/2017   AST 23 11/05/2017   ALKPHOS 79 06/29/2016   BILITOT 0.7 11/05/2017   Lab Results  Component Value Date   CHOL  120 11/05/2017   HDL 28 (L) 11/05/2017   LDLCALC 65 11/05/2017   TRIG 207 (H) 11/05/2017   CHOLHDL 4.3 11/05/2017     Assessment & Plan    1.  Persistent atrial fibrillation: Maintaining sinus rhythm and he denies any palpitations.  He is concerned about bruising in the setting of Eliquis therapy.  He has not had any bleeding or significant trauma.  We discussed the indication and dosing for Eliquis at length today.  He initially questioned if the dose could be reduced.  Though he does have stage III chronic kidney disease with a creatinine of 1.71 in December 2019, at this point, he remains under the age of 14 and his weight is well above 60 kg.  That said, he will turn 31 on October 21 of this year and at that point, assuming creatinine remains greater than 1.5, we will need to be reducing his dose to 2.5 mg twice daily.  He is willing to stay on the higher dose until his 78th birthday, at which point we can reconsider based on renal function.  He remains on amiodarone and beta-blocker therapy.  2.  Bruising: See discussion above.  3.  Daytime somnolence: Patient frequently naps in the afternoon and says he is often tired after working outside in the mornings.  On further discussion, he notes that he does not sleep well at night, awaking frequently and often getting only about 4 hours of sleep total.  We discussed that this is likely playing a large role in his daytime sleepiness and fatigue.  I recommended a sleep study however, he wishes to defer as he thinks it will cost too much money.  His wife has sleep apnea and uses CPAP but at this time, he would not be interested in something like that.  In the setting of bruising and fatigue, I will follow-up a CBC today.  4.  Essential hypertension: Stable on beta-blocker, ACE inhibitor, and amlodipine therapy.  5.  Stage III chronic kidney disease: Follow-up basic metabolic panel today.  6.  Status post bioprosthetic aortic valve replacement: Normal  functioning valve by echo earlier this year.  7.  History of chest pain with nonobstructive CAD: Nonischemic Myoview earlier this year.  No recurrent chest pain.  He remains on beta-blocker and statin therapy.  8.  Right inguinal hernia pending repair: Previously cleared in the setting of nonischemic Myoview and  reassuring echo.  Surgery now on hold until late October.  9.  Hyperlipidemia: LDL 65 in October 2019 with normal LFTs at that time.  Continue statin therapy.  10.  DMII: A1c 7.1 last Oct.  On glipizide and actos.  Followed by primary care.  11.  Disposition: Follow-up CBC and basic metabolic panel today.  He will follow-up in clinic in 3 months at which point, we will likely be reducing his Eliquis dose to 2.5 mg twice daily as he turns 78.  Murray Hodgkins, NP 08/14/2018, 10:10 AM

## 2018-08-14 NOTE — Patient Instructions (Signed)
Medication Instructions:  Your physician recommends that you continue on your current medications as directed. Please refer to the Current Medication list given to you today.  If you need a refill on your cardiac medications before your next appointment, please call your pharmacy.   Lab work: TODAY: BMET & CBC   If you have labs (blood work) drawn today and your tests are completely normal, you will receive your results only by: Marland Kitchen MyChart Message (if you have MyChart) OR . A paper copy in the mail If you have any lab test that is abnormal or we need to change your treatment, we will call you to review the results.  Testing/Procedures: None   Follow-Up: At [facility], you and your health needs are our priority.  As part of our continuing mission to provide you with exceptional heart care, we have created designated Provider Care Teams.  These Care Teams include your primary Cardiologist (physician) and Advanced Practice Providers (APPs -  Physician Assistants and Nurse Practitioners) who all work together to provide you with the care you need, when you need it. You will need a follow up appointment in 3 months.  Please call our office 2 months in advance to schedule this appointment.  You may see Dr. Rockey Situ or one of the following Advanced Practice Providers on your designated Care Team:   Murray Hodgkins, NP Christell Faith, PA-C . Marrianne Mood, PA-C  Any Other Special Instructions Will Be Listed Below (If Applicable).

## 2018-08-15 ENCOUNTER — Other Ambulatory Visit: Payer: Self-pay

## 2018-08-15 ENCOUNTER — Telehealth: Payer: Self-pay | Admitting: Nurse Practitioner

## 2018-08-15 ENCOUNTER — Other Ambulatory Visit: Payer: Self-pay | Admitting: Cardiovascular Disease

## 2018-08-15 LAB — CBC
Hematocrit: 35.2 % — ABNORMAL LOW (ref 37.5–51.0)
Hemoglobin: 11.7 g/dL — ABNORMAL LOW (ref 13.0–17.7)
MCH: 31.3 pg (ref 26.6–33.0)
MCHC: 33.2 g/dL (ref 31.5–35.7)
MCV: 94 fL (ref 79–97)
Platelets: 160 10*3/uL (ref 150–450)
RBC: 3.74 x10E6/uL — ABNORMAL LOW (ref 4.14–5.80)
RDW: 13.2 % (ref 11.6–15.4)
WBC: 6.2 10*3/uL (ref 3.4–10.8)

## 2018-08-15 LAB — BASIC METABOLIC PANEL
BUN/Creatinine Ratio: 15 (ref 10–24)
BUN: 34 mg/dL — ABNORMAL HIGH (ref 8–27)
CO2: 22 mmol/L (ref 20–29)
Calcium: 9.4 mg/dL (ref 8.6–10.2)
Chloride: 103 mmol/L (ref 96–106)
Creatinine, Ser: 2.34 mg/dL — ABNORMAL HIGH (ref 0.76–1.27)
GFR calc Af Amer: 29 mL/min/{1.73_m2} — ABNORMAL LOW (ref >59)
GFR calc non Af Amer: 25 mL/min/{1.73_m2} — ABNORMAL LOW (ref >59)
Glucose: 127 mg/dL — ABNORMAL HIGH (ref 65–99)
Potassium: 4.1 mmol/L (ref 3.5–5.2)
Sodium: 145 mmol/L — ABNORMAL HIGH (ref 134–144)

## 2018-08-15 NOTE — Telephone Encounter (Signed)
Notes recorded by Emily Filbert, RN on 08/15/2018 at 5:27 PM EDT  Attempted to call the patient back.  No answer- he had called back earlier today and asked that a detailed message be left on his voice mail.   Detailed message was left on his home voice mail.  I asked that he call back with any questions or concerns.

## 2018-08-15 NOTE — Telephone Encounter (Signed)
Patient will not be home later but asked that nurse leave detailed msg and instructions on phone.

## 2018-08-15 NOTE — Telephone Encounter (Signed)
Attempted to call the patient. No answer- I left a message to please call back.  

## 2018-08-15 NOTE — Telephone Encounter (Signed)
Notes recorded by Theora Gianotti, NP on 08/15/2018 at 9:41 AM EDT  Blood counts stable.  ------   Notes recorded by Theora Gianotti, NP on 08/15/2018 at 9:41 AM EDT  Blood count stable. Creatinine is higher than it had been previously - was trending 1.4-1.7, currently 2.34. His nephrologist would know better if this is a new change or in line with what they have been seeing - he should f/u w/ them. He needs to be sure that he is hydrating adequately throughout the day.

## 2018-08-30 ENCOUNTER — Other Ambulatory Visit: Payer: Self-pay | Admitting: Cardiovascular Disease

## 2018-09-01 NOTE — Telephone Encounter (Signed)
Please review for refill. Thanks!  

## 2018-09-01 NOTE — Telephone Encounter (Signed)
Pt's age 78, wt 92.9 kg, SCr 2.34, CrCl 33.64, last ov w/ CB 08/14/18. Will need to reduce dosage to 2.5 mg BID in October when pt turns 78.

## 2018-09-10 ENCOUNTER — Telehealth: Payer: Self-pay | Admitting: Cardiovascular Disease

## 2018-09-10 MED ORDER — AMOXICILLIN 500 MG PO CAPS: ORAL_CAPSULE | ORAL | 2 refills | Status: DC

## 2018-09-10 NOTE — Telephone Encounter (Addendum)
Contacted [facility] and spoke with Dixie. Adv her of our Pharm-D recommendations below.  Spoke with the patient and made him aware as well. Adv the patient that Amoxicillin 2g to be taken 30-60 min prior to his dental procedure tomorrow has been sent to his pharmacy. Patient verbalized understanding and voiced appreciation for the call.

## 2018-09-10 NOTE — Telephone Encounter (Signed)
Patient has a hx of AVR. Dr.Gollan is out of the office will route to a Pharm-D to advise if antibiotic prophylaxis is indicated.

## 2018-09-10 NOTE — Telephone Encounter (Signed)
Dentist office calling to verify if patient still needs a premed before a procedure. Originally had a  Heart valve procedure in 2015 which brought the premed on but now dental office is wanting to check back on necessity.   Patient is due for dental procedure morning of 8/13

## 2018-09-10 NOTE — Telephone Encounter (Signed)
Yes patient will continue to need SBE prophylaxis prior to dental procedures. He will need amoxicillin 2g taken 30-31min prior to procedure. I have sent this into the pharmacy. Please advise patient and dental office. Thank you

## 2018-09-15 ENCOUNTER — Other Ambulatory Visit: Payer: Self-pay | Admitting: Cardiovascular Disease

## 2018-09-29 ENCOUNTER — Other Ambulatory Visit: Payer: Self-pay | Admitting: Cardiovascular Disease

## 2018-09-29 ENCOUNTER — Other Ambulatory Visit: Payer: Self-pay | Admitting: Nurse Practitioner

## 2018-09-29 DIAGNOSIS — I10 Essential (primary) hypertension: Secondary | ICD-10-CM

## 2018-09-29 NOTE — Telephone Encounter (Signed)
Please schedule routine appointment in 3 months

## 2018-10-27 ENCOUNTER — Other Ambulatory Visit: Payer: Self-pay | Admitting: Family Medicine

## 2018-10-27 ENCOUNTER — Other Ambulatory Visit: Payer: Self-pay | Admitting: Cardiovascular Disease

## 2018-10-27 DIAGNOSIS — E119 Type 2 diabetes mellitus without complications: Secondary | ICD-10-CM

## 2018-10-27 NOTE — Telephone Encounter (Signed)
Requested medication (s) are due for refill today: yes  Requested medication (s) are on the active medication list: yes  Last refill:  07/31/2018  Future visit scheduled: no  Notes to clinic: review for refill   Requested Prescriptions  Pending Prescriptions Disp Refills   pioglitazone (ACTOS) 30 MG tablet [Pharmacy Med Name: PIOGLITAZONE HCL 30 MG TAB] 90 tablet 1    Sig: TAKE [place] A DAY     Endocrinology:  Diabetes - Glitazones - pioglitazone Failed - 10/27/2018 11:31 AM      Failed - HBA1C is between 0 and 7.9 and within 180 days    Hemoglobin A1C  Date Value Ref Range Status  11/05/2017 7.1 (A) 4.0 - 5.6 % Final   HbA1c, POC (prediabetic range)  Date Value Ref Range Status  11/05/2017 7.1 (A) 5.7 - 6.4 % Final   HbA1c, POC (controlled diabetic range)  Date Value Ref Range Status  11/05/2017 7.1 (A) 0.0 - 7.0 % Final   HbA1c POC (<> result, manual entry)  Date Value Ref Range Status  11/05/2017 7.1 4.0 - 5.6 % Final         Passed - Valid encounter within last 6 months    Recent Outpatient Visits          5 months ago Type 2 diabetes mellitus with complication, without long-term current use of insulin [facility])   [place], Satira Anis, MD   9 months ago Viral upper respiratory tract infection   [place], NP   11 months ago Unilateral recurrent inguinal hernia without obstruction or gangrene   [place], NP   11 months ago Type 2 diabetes mellitus without complication, without long-term current use of insulin [facility])   [place], Satira Anis, MD   1 year ago Bilateral foot pain   [place], Astrid Divine, [place]

## 2018-10-27 NOTE — Telephone Encounter (Signed)
Please schedule patient for follow up in the next 30 days.  

## 2018-10-27 NOTE — Telephone Encounter (Signed)
Please review for refill. Thanks!  

## 2018-10-27 NOTE — Telephone Encounter (Signed)
Pt's age 78, wt 92.9 kg, SCr 2.34, CrCl 33.64, last ov w/ Ignacia Bayley, NP 08/14/18.

## 2018-10-27 NOTE — Telephone Encounter (Signed)
Mailbox full, please schedule appt within the next 30 days. Prescription has been sent to pharmacy

## 2018-10-27 NOTE — Telephone Encounter (Signed)
Pt's dose will need to be reduced to 2.5 mg on his 78th birthday, 11/19/18.

## 2018-10-28 NOTE — Telephone Encounter (Signed)
Tried contacting pt again, mailbox full

## 2018-10-30 ENCOUNTER — Telehealth: Payer: Self-pay | Admitting: Family Medicine

## 2018-10-30 NOTE — Telephone Encounter (Signed)
Pt called in, he is having a gout flare up. Pt would like to know what could he be prescribed that could help?    Pharmacy:  [place], [place] [phone] (Phone) [phone] (Fax)     Phone: [phone]

## 2018-10-30 NOTE — Telephone Encounter (Signed)
Mailbox is full.

## 2018-10-30 NOTE — Telephone Encounter (Signed)
Pt will need an appt.

## 2018-10-31 DIAGNOSIS — M79671 Pain in right foot: Secondary | ICD-10-CM | POA: Diagnosis not present

## 2018-10-31 DIAGNOSIS — R252 Cramp and spasm: Secondary | ICD-10-CM | POA: Diagnosis not present

## 2018-11-03 ENCOUNTER — Telehealth: Payer: Self-pay | Admitting: Family Medicine

## 2018-11-03 NOTE — Telephone Encounter (Signed)
Patient requesting call back from CMA to discuss whether TURMERIC PO is appropriate to take for poor circulation. Patient would like a call back to discuss further before scheduling appointment.

## 2018-11-04 NOTE — Telephone Encounter (Signed)
Left detailed voicemail for patient.

## 2018-11-24 ENCOUNTER — Other Ambulatory Visit: Payer: Self-pay | Admitting: Cardiovascular Disease

## 2018-11-24 NOTE — Telephone Encounter (Signed)
Pt just turned 78 last week, wt 92.9 kg, SCr 2.34, CrCl 33.08, per Emeline Gins last ov 08/14/18: "We discussed the indication and dosing for Eliquis at length today.  He initially questioned if the dose could be reduced.  Though he does have stage III chronic kidney disease with a creatinine of 1.71 in December 2019, at this point, he remains under the age of 37 and his weight is well above 60 kg.  That said, he will turn 46 on October 21 of this year and at that point, assuming creatinine remains greater than 1.5, we will need to be reducing his dose to 2.5 mg twice daily.  He is willing to stay on the higher dose until his 78th birthday, at which point we can reconsider based on renal function. "  Sending in updated lower dosage.

## 2018-11-24 NOTE — Telephone Encounter (Signed)
Refill Request.  

## 2018-11-29 ENCOUNTER — Ambulatory Visit: Admit: 2018-11-29 | Payer: Medicare Other | Admitting: Surgery

## 2018-11-29 SURGERY — ROBOT ASSISTED INGUINAL HERNIA REPAIR
Anesthesia: General | Laterality: Right

## 2018-12-01 ENCOUNTER — Telehealth: Payer: Self-pay

## 2018-12-01 DIAGNOSIS — E782 Mixed hyperlipidemia: Secondary | ICD-10-CM

## 2018-12-02 MED ORDER — ROSUVASTATIN CALCIUM 10 MG PO TABS: ORAL_TABLET | ORAL | 0 refills | Status: DC

## 2018-12-02 NOTE — Telephone Encounter (Signed)
Pt scheduled a cpe and med refill for 11.13.2020 with Kristeen Miss

## 2018-12-02 NOTE — Telephone Encounter (Signed)
Please schedule patient for follow up in the next 14 days.

## 2018-12-12 ENCOUNTER — Other Ambulatory Visit: Payer: Self-pay

## 2018-12-12 ENCOUNTER — Encounter: Payer: Self-pay | Admitting: Family Medicine

## 2018-12-12 ENCOUNTER — Ambulatory Visit (INDEPENDENT_AMBULATORY_CARE_PROVIDER_SITE_OTHER): Payer: Medicare Other | Admitting: Family Medicine

## 2018-12-12 VITALS — BP 122/64 | HR 68 | Temp 97.9°F | Resp 14 | Ht 68.0 in | Wt 209.3 lb

## 2018-12-12 DIAGNOSIS — R82998 Other abnormal findings in urine: Secondary | ICD-10-CM | POA: Diagnosis not present

## 2018-12-12 DIAGNOSIS — D509 Iron deficiency anemia, unspecified: Secondary | ICD-10-CM | POA: Insufficient documentation

## 2018-12-12 DIAGNOSIS — R319 Hematuria, unspecified: Secondary | ICD-10-CM | POA: Diagnosis not present

## 2018-12-12 DIAGNOSIS — Z7901 Long term (current) use of anticoagulants: Secondary | ICD-10-CM

## 2018-12-12 DIAGNOSIS — I10 Essential (primary) hypertension: Secondary | ICD-10-CM | POA: Diagnosis not present

## 2018-12-12 DIAGNOSIS — N1832 Chronic kidney disease, stage 3b: Secondary | ICD-10-CM | POA: Diagnosis not present

## 2018-12-12 DIAGNOSIS — N179 Acute kidney failure, unspecified: Secondary | ICD-10-CM | POA: Diagnosis not present

## 2018-12-12 DIAGNOSIS — E782 Mixed hyperlipidemia: Secondary | ICD-10-CM | POA: Diagnosis not present

## 2018-12-12 DIAGNOSIS — I4891 Unspecified atrial fibrillation: Secondary | ICD-10-CM

## 2018-12-12 DIAGNOSIS — D692 Other nonthrombocytopenic purpura: Secondary | ICD-10-CM

## 2018-12-12 DIAGNOSIS — I471 Supraventricular tachycardia: Secondary | ICD-10-CM

## 2018-12-12 DIAGNOSIS — E119 Type 2 diabetes mellitus without complications: Secondary | ICD-10-CM | POA: Diagnosis not present

## 2018-12-12 DIAGNOSIS — D649 Anemia, unspecified: Secondary | ICD-10-CM | POA: Insufficient documentation

## 2018-12-12 DIAGNOSIS — Z952 Presence of prosthetic heart valve: Secondary | ICD-10-CM

## 2018-12-12 DIAGNOSIS — Z5181 Encounter for therapeutic drug level monitoring: Secondary | ICD-10-CM

## 2018-12-12 LAB — POCT UA - MICROALBUMIN: Microalbumin Ur, POC: 100 mg/L

## 2018-12-12 LAB — POCT GLYCOSYLATED HEMOGLOBIN (HGB A1C): Hemoglobin A1C: 6.8 % — AB (ref 4.0–5.6)

## 2018-12-12 LAB — POCT URINALYSIS DIPSTICK
Bilirubin, UA: NEGATIVE
Glucose, UA: NEGATIVE
Ketones, UA: NEGATIVE
Nitrite, UA: NEGATIVE
Odor: NORMAL
Protein, UA: POSITIVE — AB
Spec Grav, UA: 1.025 (ref 1.010–1.025)
Urobilinogen, UA: 0.2 E.U./dL
pH, UA: 7.5 (ref 5.0–8.0)

## 2018-12-12 MED ORDER — ROSUVASTATIN CALCIUM 10 MG PO TABS: ORAL_TABLET | ORAL | 1 refills | Status: DC

## 2018-12-12 MED ORDER — CEPHALEXIN 500 MG PO CAPS: 500.0000 mg | ORAL_CAPSULE | Freq: Two times a day (BID) | ORAL | 0 refills | Status: AC

## 2018-12-12 NOTE — Progress Notes (Signed)
Name: USTIN CRUICKSHANK   MRN: [id number]    DOB: 21-Jun-1940   Date:12/12/2018       Progress Note  Chief Complaint  Patient presents with   Hypertension   Diabetes   Hyperlipidemia     Subjective:   JEROMIAH OHALLORAN is a 78 y.o. male, presents to clinic for routine follow up on the conditions listed above.   Diabetes Mellitus Type II: Currently managing with glipizide 10 mg BID prior to meals, and actos 30  Pt notes good med compliance Pt has only a little LE swelling from actos otherwise no SE from meds. Fasting CBGs typically run 140-150.  No hypoglycemic episodes Denies: Polyuria, polydipsia, polyphagia, vision changes, or neuropathy - urine color change - see below A1C checked today - DM stable and well controlled with A1C 6.8 today with POC A1C  Recent pertinent labs: Lab Results  Component Value Date   HGBA1C 7.1 (A) 11/05/2017   Current diet: well balanced - but overall he kinda does what he wants, his wife has been sick and in the hospital with covid, he's been rearranging surgery for himself, he has gained some weight over the past couple months.  He has not really tried with his diet for blood pressure, cholesterol or diabetes Current exercise: none Due for - DM foot exam and eye exam - refused today ACEI/ARB: Yes Statin: Yes  Hypertension:  Currently managed on Norvasc 10 mg, lisinopril, - Managed by Cardiology-also on amiodarone 20 mg tablet daily, metoprolol currently on 6.25 mg dose at bedtime, some doses of meds recently decreased- pt also on Eliquis 2.5 mg BID, (patient denies being on Cardura for BPH?) Pt reports good med compliance and denies any SE.  No lightheadedness, hypotension, syncope.  -But he thinks that he is overmedicated, he has tried to explain his cardiac history to me today but is a poor historian and patient is new to me is unclear if he is having trouble with his memory or this is his baseline.  Patient states that he is not feeling  confused and this is his baseline but he is here alone today.  He is known to the [place] she states this is fairly consistent with his baseline as well Blood pressure today is well controlled. BP Readings from Last 3 Encounters:  12/12/18 122/64  08/14/18 128/60  05/01/18 111/65  Pt denies CP, SOB, exertional sx, LE edema, palpitation, Ha's, visual disturbances Dietary efforts for BP?  None - see above He is not using Lasix he has not taken it for several months Reviewed chart, recent and past cardiac OV notes, care everywhere, CV studies and procedures over the past 2 years: Patient has a complicated cardiac history including aortic stenosis status post aortic valve replacement with porcine valve -surgery was done at [facility], SVT episodes following AVR requiring ablation in 2016 at [facility], but he is currently seeing cardiology with [facility] health, Dr. Rockey Situ  He has a history of hypotension had his tamsulosin stopped and chlorthalidone stopped last year, he is intolerant of statins Also mild history of coronary artery disease, non-obstructing.  12/2017 had cardioversion for afib - pt today denies afib or arrhythmia history and states it was all because [facility] messed him up with his procedure  Hyperlipidemia: Current Medication Regimen:  crestor 10 every other day Last Lipids: Lab Results  Component Value Date   CHOL 120 11/05/2017   HDL 28 (L) 11/05/2017   LDLCALC 65 11/05/2017   TRIG 207 (H) 11/05/2017  CHOLHDL 4.3 11/05/2017  - Current Diet: see above - Denies: Chest pain, shortness of breath, myalgias. - Documented aortic atherosclerosis? No - Risk factors for atherosclerosis: diabetes mellitus, hypercholesterolemia and hypertension  CKD stage 3, recent labs with cardiology show worsening renal function: Lab Results  Component Value Date   CREATININE 2.34 (H) 08/14/2018   Lab Results  Component Value Date   BUN 34 (H) 08/14/2018   Lab Results  Component Value Date   GFRNONAA 25 (L)  08/14/2018   August 14, 2018 creatinine was elevated above baseline GFR was decreased worse than his history, prior to that 12/2017 sCr was 1.71, 10/2017 1.41, and GFR 12/2018 was 43 and 55 respectively. Cardiology result note at that time did mention the worsening renal function question of his prerenal AKI, asked the patient if he was taking Lasix.  There were several attempts to contact the patient but does not seem that the patient ever returned the call.  Today patient states that he is aware of his worsening renal function, he asks if he needs a kidney doctor, apparently he does not have a nephrologist.  Patient also mentions yesterday he became very alarmed when he had urine that looks like [place] which she described as dark yellow-brown and he wondered if he has a UTI or worried something was wrong with him but he says that resolved today.  We were going to get a urine for his urine microalbumin test which is due today, urine is dark in generally brown in color, small amount of urine, we added on a urine dip which is pertinent for blood, protein and trace leukocytes, will send off her urine culture and microscopy.  Patient denies any dysuria, hematuria, straining, urgency, urinary frequency.  He drinks a couple cups of coffee a day and not much else throughout the day.  He adamantly denies taking Lasix in the last several months.    Patient Active Problem List   Diagnosis Date Noted   Type 2 diabetes mellitus with complication, without long-term current use of insulin ([place]) 11/05/2017   Arthritis of both feet 11/05/2017   H/O colonoscopy with polypectomy 08/27/2014   S/P AVR (aortic valve replacement) 08/24/2014   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   Hypertension goal BP (blood pressure) < 140/90 08/24/2014   CKD (chronic kidney disease) stage 3, GFR 30-59 ml/min 06/17/2014   Atrial fibrillation ([place]) 06/17/2014   H/O paroxysmal supraventricular tachycardia  05/31/2014   SVT (supraventricular tachycardia) ([place]) 05/29/2014   Arteriosclerosis of coronary artery 01/11/2014   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 12/01/2013   Mixed hyperlipidemia 12/01/2013    Past Surgical History:  Procedure Laterality Date   AORTIC VALVE REPLACEMENT     CARDIAC CATHETERIZATION     CARDIOVERSION N/A 01/01/2018   Procedure: CARDIOVERSION;  Surgeon: Minna Merritts, MD;  Location: [facility] ORS;  Service: Cardiovascular;  Laterality: N/A;   COLONOSCOPY  2010   heart valve replacment  2015    Family History  Problem Relation Age of Onset   Cancer Mother    Stroke Father     Social History   Socioeconomic History   Marital status: Married    Spouse name: betty   Number of children: 2   Years of education: 12   Highest education level: High school graduate  Occupational History   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    Worry: Never true    Inability: Never true   Transportation needs    Medical: No    Non-medical: No  Tobacco Use   Smoking status: Former Smoker    Packs/day: 0.50    Years: 1.00    Pack years: 0.50    Types: Cigarettes    Quit date: 01/30/1972    Years since quitting: 46.8   Smokeless tobacco: Never Used   Tobacco comment: over 45 yrs ago  Substance and Sexual Activity   Alcohol use: No    Alcohol/week: 0.0 standard drinks   Drug use: No   Sexual activity: Not Currently  Lifestyle   Physical activity    Days per week: 0 days    Minutes per session: 0 min   Stress: Not at all  Relationships   Social connections    Talks on phone: Once a week    Gets together: Once a week    Attends religious service: Never    Active member of club or organization: No    Attends meetings of clubs or organizations: Never    Relationship status: Married   Intimate partner violence    Fear of current  or ex partner: No    Emotionally abused: No    Physically abused: No    Forced sexual activity: Not on file  Other Topics Concern   Not on file  Social History Narrative   Not on file     Current Outpatient Medications:    amiodarone (PACERONE) 200 MG tablet, TAKE 1 TABLET BY MOUTH ONCE A DAY, Disp: 90 tablet, Rfl: 3   amLODipine (NORVASC) 10 MG tablet, TAKE 1 TABLET BY MOUTH ONCE DAILY, Disp: 90 tablet, Rfl: 0   amoxicillin (AMOXIL) 500 MG capsule, Take 4 capsules by mouth 30-60 min prior to dental procedure., Disp: 4 capsule, Rfl: 2   apixaban (ELIQUIS) 2.5 MG TABS tablet, Take 1 tablet (2.5 mg total) by mouth 2 (two) times daily., Disp: 180 tablet, Rfl: 1   furosemide (LASIX) 20 MG tablet, Take 1 tablet (20 mg total) by mouth daily as needed., Disp: 90 tablet, Rfl: 1   glipiZIDE (GLUCOTROL) 10 MG tablet, TAKE 1 TABLET BY MOUTH TWICE A DAY BEFORE A MEAL., Disp: 180 tablet, Rfl: 0   lisinopril (ZESTRIL) 40 MG tablet, TAKE 1 TABLET BY MOUTH ONCE DAILY, Disp: 90 tablet, Rfl: 0   metoprolol succinate (TOPROL-XL) 25 MG 24 hr tablet, Take 0.5 tablets (12.5 mg total) by mouth daily. (Patient taking differently: Take 6.25 mg by mouth at bedtime. Pt is only taking 1/4 of a tablet), Disp: 45 tablet, Rfl: 3   pioglitazone (ACTOS) 30 MG tablet, TAKE 1 TABLET BY MOUTH ONCE A DAY, Disp: 90 tablet, Rfl: 0   cephALEXin (KEFLEX) 500 MG capsule, Take 1 capsule (500 mg total) by mouth 2 (two) times daily for 7 days., Disp: 14 capsule, Rfl: 0   doxazosin (CARDURA) 8 MG tablet, TAKE 1 TABLET BY MOUTH EVERY NIGHT AT BEDTIME (Patient not taking: Reported on 12/12/2018), Disp: 30 tablet, Rfl: 5   rosuvastatin (CRESTOR) 10 MG tablet, TAKE 1 TABLET BY MOUTH DAILY NEED APPT FOR FURTHER REFILLS, Disp: 90 tablet, Rfl: 1  Allergies  Allergen Reactions   Gabapentin     dizzy   Hydralazine Hcl Rash    I personally reviewed active problem list, medication list, allergies, family history, social  history, health maintenance, notes from last encounter, lab results, imaging with the patient/caregiver today.  Review  of Systems  Constitutional: Negative.   HENT: Negative.   Eyes: Negative.   Respiratory: Negative.  Negative for cough, chest tightness and shortness of breath.   Cardiovascular: Negative.  Negative for chest pain, palpitations and leg swelling.  Gastrointestinal: Negative for abdominal pain, anal bleeding, blood in stool, constipation, diarrhea, nausea and vomiting.       Known hernia still irritating, seeing general surgery   Endocrine: Negative.   Genitourinary: Negative.  Negative for decreased urine volume, difficulty urinating, dysuria, flank pain, frequency, hematuria and urgency.  Musculoskeletal: Negative.  Negative for arthralgias and back pain.  Skin: Negative.  Negative for color change and pallor.  Allergic/Immunologic: Negative.   Neurological: Negative.  Negative for dizziness, syncope, facial asymmetry, weakness, light-headedness and headaches.  Hematological: Negative for adenopathy. Bruises/bleeds easily.  Psychiatric/Behavioral: Negative.   All other systems reviewed and are negative.    Objective:    Vitals:   12/12/18 1313  BP: 122/64  Pulse: 68  Resp: 14  Temp: 97.9 F (36.6 C)  SpO2: 95%  Weight: 209 lb 4.8 oz (94.9 kg)  Height: 5\' 8"  (1.727 m)    Body mass index is 31.82 kg/m.  Physical Exam Vitals signs and nursing note reviewed.  Constitutional:      General: He is not in acute distress.    Appearance: Normal appearance. He is well-developed. He is not ill-appearing, toxic-appearing or diaphoretic.     Interventions: Face mask in place.  HENT:     Head: Normocephalic and atraumatic.     Jaw: No trismus.     Right Ear: External ear normal.     Left Ear: External ear normal.  Eyes:     General: Lids are normal. No scleral icterus.    Conjunctiva/sclera: Conjunctivae normal.     Pupils: Pupils are equal, round, and reactive  to light.  Neck:     Musculoskeletal: Normal range of motion and neck supple.     Trachea: Trachea and phonation normal. No tracheal deviation.  Cardiovascular:     Rate and Rhythm: Normal rate and regular rhythm.     Pulses: Normal pulses.          Radial pulses are 2+ on the right side and 2+ on the left side.       Posterior tibial pulses are 2+ on the right side and 2+ on the left side.     Heart sounds: Normal heart sounds. No murmur. No friction rub. No gallop.      Comments: Mild b/l LE non-pitting edema Pulmonary:     Effort: Pulmonary effort is normal. No respiratory distress.     Breath sounds: Normal breath sounds. No stridor. No wheezing, rhonchi or rales.  Abdominal:     General: Bowel sounds are normal. There is no distension.     Palpations: Abdomen is soft.     Tenderness: There is no abdominal tenderness. There is no guarding or rebound.  Musculoskeletal: Normal range of motion.     Right lower leg: Edema present.     Left lower leg: Edema present.  Skin:    General: Skin is warm and dry.     Capillary Refill: Capillary refill takes less than 2 seconds.     Coloration: Skin is not jaundiced or pale.     Findings: No rash.     Nails: There is no clubbing.   Neurological:     Mental Status: He is alert.     Cranial Nerves: No dysarthria or  facial asymmetry.     Motor: No tremor or abnormal muscle tone.     Coordination: Coordination normal.     Gait: Gait normal.  Psychiatric:        Mood and Affect: Mood normal.        Speech: Speech normal.        Behavior: Behavior normal. Behavior is cooperative.      Recent Results (from the past 2160 hour(s))  POCT A1C     Status: Abnormal   Collection Time: 12/12/18  2:14 PM  Result Value Ref Range   Hemoglobin A1C 6.8 (A) 4.0 - 5.6 %   HbA1c POC (<> result, manual entry)     HbA1c, POC (prediabetic range)     HbA1c, POC (controlled diabetic range)    POCT UA - Microalbumin     Status: Abnormal   Collection  Time: 12/12/18  2:14 PM  Result Value Ref Range   Microalbumin Ur, POC 100 mg/L   Creatinine, POC     Albumin/Creatinine Ratio, Urine, POC    POCT UA dip     Status: Abnormal   Collection Time: 12/12/18  2:15 PM  Result Value Ref Range   Color, UA drk    Clarity, UA cloudy    Glucose, UA Negative Negative   Bilirubin, UA neg    Ketones, UA neg    Spec Grav, UA 1.025 1.010 - 1.025   Blood, UA positve    pH, UA 7.5 5.0 - 8.0   Protein, UA Positive (A) Negative   Urobilinogen, UA 0.2 0.2 or 1.0 E.U./dL   Nitrite, UA neg    Leukocytes, UA Trace (A) Negative   Appearance clear    Odor normal     Diabetic Foot Exam: declined today, but needed when pt willing   PHQ2/9: Depression screen [facility] 2/9 12/12/2018 05/01/2018 01/27/2018 11/05/2017 01/11/2017  Decreased Interest 0 0 0 0 0  Down, Depressed, Hopeless 1 0 0 0 0  PHQ - 2 Score 1 0 0 0 0  Altered sleeping 0 0 1 0 -  Tired, decreased energy 0 0 3 0 -  Change in appetite 0 0 0 0 -  Feeling bad or failure about yourself  0 0 0 0 -  Trouble concentrating 0 0 1 0 -  Moving slowly or fidgety/restless 0 0 0 0 -  Suicidal thoughts 0 0 0 0 -  PHQ-9 Score 1 0 5 0 -  Difficult doing work/chores Not difficult at all Not difficult at all Not difficult at all Not difficult at all -    phq 9 is negative, score less than 4, reviewed today  Fall Risk: Fall Risk  12/12/2018 05/01/2018 04/07/2018 01/27/2018 12/04/2017  Falls in the past year? 0 0 0 0 0  Number falls in past yr: 0 0 - 0 -  Injury with Fall? 0 0 0 0 -  Follow up - - - - Falls evaluation completed      Functional Status Survey: Is the patient deaf or have difficulty hearing?: No Does the patient have difficulty seeing, even when wearing glasses/contacts?: No Does the patient have difficulty concentrating, remembering, or making decisions?: No Does the patient have difficulty walking or climbing stairs?: No Does the patient have difficulty dressing or bathing?: No Does the  patient have difficulty doing errands alone such as visiting a doctor's office or shopping?: No    Assessment & Plan:       ICD-10-CM   1. Controlled  type 2 diabetes mellitus without complication, without long-term current use of insulin (HCC)  E11.9 POCT A1C    POCT UA - Microalbumin    CMP w GFR   on glimipirde 10 mg BID -discussed risk of hypoglycemia, need for monitoring of blood sugar, holding with low blood sugar or skipping meals  2. Mixed hyperlipidemia  E78.2 rosuvastatin (CRESTOR) 10 MG tablet    CMP w GFR    Lipid Panel   Every other day dosing of Crestor is currently tolerated, no diet efforts and has gained some weight recheck lipids  3. Hypertension goal BP (blood pressure) < 140/90  I10 CMP w GFR   Blood pressure well controlled  4. Stage 3b chronic kidney disease  N18.32 CMP w GFR   Kidney function much worse with labs 4 months ago with cardiology, will recheck today, do not believe patient has seen nephrology in the past  5. Anemia, unspecified type  D64.9 CBC w/ Diff   History of anemia, unspecified likely anemia secondary to chronic disease, recheck CBC today  6. Dark urine  R82.998 POCT UA dip    Urine Culture    Urinalysis, microscopic only   Unclear if it is from dehydration or kidney injury, ATN etc?  holding refills on meds until labs done  7. AKI (acute kidney injury) ([place])  N17.9 CMP w GFR    POCT UA dip   last dec to July acute worsening of renal function and increase sCr, unclear if old AKI or worsening CKD - if GFR < 45 will refer to nephrology  8. 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 Hematuria, unspecified type  R31.9 Urinalysis, microscopic only   trace blood in dip today - waiting for microscopy/urine culture, asx  10. S/P AVR (aortic valve replacement)  Z95.2    2016 - seeing cardiology twice a year - on eliquis  11. SVT (supraventricular tachycardia) (HCC)  I47.1    per  cardiology - RRR today, on amiodorone and metoprolol   12. Atrial fibrillation, unspecified type ([place])  I48.91    currently controlled, s/p cardioversion on amnio and metoprolol, HR 68 today regular  13. On continuous oral anticoagulation  Z79.01   14. Encounter for medication monitoring  Z51.81 CMP w GFR    Lipid Panel    CBC w/ Diff   Did have a lengthy discussion today with the patient about his diabetes medication, educated him on careful monitoring of his blood sugars when taking glimepiride, discussed the risk of hypoglycemia in someone of his age, he verbalized understanding but he was excited when his A1c was improved to 6.8 today.  Patient was encouraged to check his blood sugar at least once a day in the morning before breakfast, hold his glimepiride if blood sugars are low or if not eating.  Consider holding or discontinuing Actos if he is still taking it he did seem a little unsure today -did review drug data information and I did suggest to hold with any dark urine and to check hepatic function which were doing today  Patient's blood pressure well controlled and his renal function is suffering may be able to decrease his lisinopril. Did encourage him to drink much more water, cannot only drink coffee.  With blood and trace leukocytes in his urine did give him Keflex 500 mg twice daily to cover for any potential UTI through the weekend but unclear if  there is a kidney injury or infection will know more when labs are resulted.   Return in about 3 months (around 03/14/2019) for Routine follow-up, Medicare Well Visit.   Delsa Grana, PA-C 12/12/18 5:05 PM

## 2018-12-12 NOTE — Progress Notes (Deleted)
Diabetes Mellitus Type II: Currently managing with glipizide 10 mg BID prior to meals, and actos 30  Pt notes good med compliance Pt has only a little LE swelling from actos otherwise no SE from meds. Fasting CBGs typically run 140-150.  No hypoglycemic episodes Denies: Polyuria, polydipsia, polyphagia, vision changes, or neuropathy - urine color change - see below A1C checked today - DM stable and well controlled with A1C 6.8 today with POC A1C  Recent pertinent labs: Lab Results  Component Value Date   HGBA1C 7.1 (A) 11/05/2017   Current diet: well balanced - but overall he kinda does what he wants, his wife has been sick and in the hospital with covid, he's been rearranging surgery for himself, he has gained some weight over the past couple months.  He has not really tried with his diet for blood pressure, cholesterol or diabetes Current exercise: none Due for - DM foot exam and eye exam - refused today ACEI/ARB: Yes Statin: Yes  Hypertension:  Currently managed on Norvasc 10 mg, lisinopril, - Managed by Cardiology-also on amiodarone 20 mg tablet daily, metoprolol currently on 6.25 mg dose at bedtime, some doses of meds recently decreased- pt also on Eliquis 2.5 mg BID, (patient denies being on Cardura for BPH?) Pt reports good med compliance and denies any SE.  No lightheadedness, hypotension, syncope.  -But he thinks that he is overmedicated, he has tried to explain his cardiac history to me today but is a poor historian and patient is new to me is unclear if he is having trouble with his memory or this is his baseline.  Patient states that he is not feeling confused and this is his baseline but he is here alone today.  He is known to the [place] she states this is fairly consistent with his baseline as well Blood pressure today is well controlled. BP Readings from Last 3 Encounters:  12/12/18 122/64  08/14/18 128/60  05/01/18 111/65  Pt denies CP, SOB, exertional sx, LE edema,  palpitation, Ha's, visual disturbances Dietary efforts for BP?  None - see above He is not using Lasix he has not taken it for several months Reviewed chart, recent and past cardiac OV notes, care everywhere, CV studies and procedures over the past 2 years: Patient has a complicated cardiac history including aortic stenosis status post aortic valve replacement with porcine valve -surgery was done at [facility], SVT episodes following AVR requiring ablation in 2016 at [facility], but he is currently seeing cardiology with [facility], Dr. Rockey Situ  He has a history of hypotension had his tamsulosin stopped and chlorthalidone stopped last year, he is intolerant of statins Also mild history of coronary artery disease, non-obstructing.  12/2017 had cardioversion for afib - pt today denies afib or arrhythmia history and states it was all because [facility] messed him up with his procedure  Hyperlipidemia:  Current Medication Regimen: Last Lipids: Lab Results  Component Value Date   CHOL 120 11/05/2017   HDL 28 (L) 11/05/2017   LDLCALC 65 11/05/2017   TRIG 207 (H) 11/05/2017   CHOLHDL 4.3 11/05/2017   - Current Diet: - {ACTIONS;DENIES/REPORTS:21021675::"Denies"}: Chest pain, shortness of breath, myalgias. - Documented aortic atherosclerosis? {yes/no:20286} - Risk factors for atherosclerosis: {Causes; risk factors atherosclerosis:10337}   Results for orders placed or performed in visit on 12/12/18  POCT A1C  Result Value Ref Range   Hemoglobin A1C 6.8 (A) 4.0 - 5.6 %   HbA1c POC (<> result, manual entry)     HbA1c,  POC (prediabetic range)     HbA1c, POC (controlled diabetic range)    POCT UA - Microalbumin  Result Value Ref Range   Microalbumin Ur, POC 100 mg/L   Creatinine, POC     Albumin/Creatinine Ratio, Urine, POC

## 2018-12-13 LAB — URINALYSIS, MICROSCOPIC ONLY
Bacteria, UA: NONE SEEN /HPF
Hyaline Cast: NONE SEEN /LPF
RBC / HPF: >=60 /HPF — AB (ref 0–2)

## 2018-12-13 LAB — URINE CULTURE
MICRO NUMBER:: 1099834
SPECIMEN QUALITY:: ADEQUATE

## 2018-12-15 ENCOUNTER — Encounter: Payer: Self-pay | Admitting: Surgery

## 2018-12-15 ENCOUNTER — Other Ambulatory Visit: Payer: Self-pay | Admitting: Family Medicine

## 2018-12-15 ENCOUNTER — Other Ambulatory Visit: Payer: Self-pay

## 2018-12-15 ENCOUNTER — Ambulatory Visit (INDEPENDENT_AMBULATORY_CARE_PROVIDER_SITE_OTHER): Payer: Medicare Other | Admitting: Surgery

## 2018-12-15 ENCOUNTER — Telehealth: Payer: Self-pay

## 2018-12-15 VITALS — BP 144/72 | HR 68 | Temp 97.9°F | Resp 14 | Ht 68.0 in | Wt 209.8 lb

## 2018-12-15 DIAGNOSIS — K4090 Unilateral inguinal hernia, without obstruction or gangrene, not specified as recurrent: Secondary | ICD-10-CM | POA: Diagnosis not present

## 2018-12-15 DIAGNOSIS — R319 Hematuria, unspecified: Secondary | ICD-10-CM

## 2018-12-15 DIAGNOSIS — N289 Disorder of kidney and ureter, unspecified: Secondary | ICD-10-CM

## 2018-12-15 DIAGNOSIS — D509 Iron deficiency anemia, unspecified: Secondary | ICD-10-CM

## 2018-12-15 LAB — TEST AUTHORIZATION

## 2018-12-15 LAB — COMPLETE METABOLIC PANEL WITH GFR
AG Ratio: 1.9 (calc) (ref 1.0–2.5)
ALT: 31 U/L (ref 9–46)
AST: 35 U/L (ref 10–35)
Albumin: 4.1 g/dL (ref 3.6–5.1)
Alkaline phosphatase (APISO): 73 U/L (ref 35–144)
BUN/Creatinine Ratio: 15 (calc) (ref 6–22)
BUN: 32 mg/dL — ABNORMAL HIGH (ref 7–25)
CO2: 29 mmol/L (ref 20–32)
Calcium: 9.2 mg/dL (ref 8.6–10.3)
Chloride: 103 mmol/L (ref 98–110)
Creat: 2.09 mg/dL — ABNORMAL HIGH (ref 0.70–1.11)
GFR, Est African American: 34 mL/min/{1.73_m2} — ABNORMAL LOW (ref >=60)
GFR, Est Non African American: 29 mL/min/{1.73_m2} — ABNORMAL LOW (ref >=60)
Globulin: 2.2 g/dL (calc) (ref 1.9–3.7)
Glucose, Bld: 196 mg/dL — ABNORMAL HIGH (ref 65–99)
Potassium: 4 mmol/L (ref 3.5–5.3)
Sodium: 141 mmol/L (ref 135–146)
Total Bilirubin: 0.6 mg/dL (ref 0.2–1.2)
Total Protein: 6.3 g/dL (ref 6.1–8.1)

## 2018-12-15 LAB — LIPID PANEL
Cholesterol: 137 mg/dL (ref <200)
HDL: 30 mg/dL — ABNORMAL LOW (ref >=40)
LDL Cholesterol (Calc): 79 mg/dL (calc)
Non-HDL Cholesterol (Calc): 107 mg/dL (calc) (ref <130)
Total CHOL/HDL Ratio: 4.6 (calc) (ref <5.0)
Triglycerides: 186 mg/dL — ABNORMAL HIGH (ref <150)

## 2018-12-15 LAB — CBC WITH DIFFERENTIAL/PLATELET
Absolute Monocytes: 467 cells/uL (ref 200–950)
Basophils Absolute: 38 cells/uL (ref 0–200)
Basophils Relative: 0.6 %
Eosinophils Absolute: 109 cells/uL (ref 15–500)
Eosinophils Relative: 1.7 %
HCT: 34.2 % — ABNORMAL LOW (ref 38.5–50.0)
Hemoglobin: 11.3 g/dL — ABNORMAL LOW (ref 13.2–17.1)
Lymphs Abs: 973 cells/uL (ref 850–3900)
MCH: 30.6 pg (ref 27.0–33.0)
MCHC: 33 g/dL (ref 32.0–36.0)
MCV: 92.7 fL (ref 80.0–100.0)
MPV: 11.1 fL (ref 7.5–12.5)
Monocytes Relative: 7.3 %
Neutro Abs: 4813 cells/uL (ref 1500–7800)
Neutrophils Relative %: 75.2 %
Platelets: 179 10*3/uL (ref 140–400)
RBC: 3.69 10*6/uL — ABNORMAL LOW (ref 4.20–5.80)
RDW: 13.1 % (ref 11.0–15.0)
Total Lymphocyte: 15.2 %
WBC: 6.4 10*3/uL (ref 3.8–10.8)

## 2018-12-15 LAB — IRON, TOTAL/TOTAL IRON BINDING CAP
%SAT: 35 % (calc) (ref 20–48)
Iron: 83 ug/dL (ref 50–180)
TIBC: 239 mcg/dL (calc) — ABNORMAL LOW (ref 250–425)

## 2018-12-15 NOTE — Telephone Encounter (Signed)
-----   Message from Delsa Grana, [place] sent at 12/15/2018  9:35 AM EST ----- Can you please add on iron panel for dx microcytic anemia. We will need to do stool cards for Timothy Riley.  I will send you a result note in a bit too.

## 2018-12-15 NOTE — Addendum Note (Signed)
Addended by: Delsa Grana on: 12/15/2018 01:05 PM   Modules accepted: Orders

## 2018-12-15 NOTE — Telephone Encounter (Signed)
Ok sorry so you want him to come in for an office visit, when?

## 2018-12-15 NOTE — Patient Instructions (Addendum)
Our surgery scheduler will call you within 24-48 hours. Please have the [place] surgery sheet available when speaking with her.   Inguinal Hernia, Adult An inguinal hernia is when fat or your intestines push through a weak spot in a muscle where your leg meets your lower belly (groin). This causes a rounded lump (bulge). This kind of hernia could also be:  In your scrotum, if you are male.  In folds of skin around your vagina, if you are male. There are three types of inguinal hernias. These include:  Hernias that can be pushed back into the belly (are reducible). This type rarely causes pain.  Hernias that cannot be pushed back into the belly (are incarcerated).  Hernias that cannot be pushed back into the belly and lose their blood supply (are strangulated). This type needs emergency surgery. If you do not have symptoms, you may not need treatment. If you have symptoms or a large hernia, you may need surgery. Follow these instructions at home: Lifestyle  Do these things if told by your doctor so you do not have trouble pooping (constipation): ? Drink enough fluid to keep your pee (urine) pale yellow. ? Eat foods that have a lot of fiber. These include fresh fruits and vegetables, whole grains, and beans. ? Limit foods that are high in fat and processed sugars. These include foods that are fried or sweet. ? Take medicine for trouble pooping.  Avoid lifting heavy objects.  Avoid standing for long amounts of time.  Do not use any products that contain nicotine or tobacco. These include cigarettes and e-cigarettes. If you need help quitting, ask your doctor.  Stay at a healthy weight. General instructions  You may try to push your hernia in by very gently pressing on it when you are lying down. Do not try to force the bulge back in if it will not push in easily.  Watch your hernia for any changes in shape, size, or color. Tell your doctor if you see any changes.  Take  over-the-counter and prescription medicines only as told by your doctor.  Keep all follow-up visits as told by your doctor. This is important. Contact a doctor if:  You have a fever.  You have new symptoms.  Your symptoms get worse. Get help right away if:  The area where your leg meets your lower belly has: ? Pain that gets worse suddenly. ? A bulge that gets bigger suddenly, and it does not get smaller after that. ? A bulge that turns red or purple. ? A bulge that is painful when you touch it.  You are a man, and your scrotum: ? Suddenly feels painful. ? Suddenly changes in size.  You cannot push the hernia in by very gently pressing on it when you are lying down. Do not try to force the bulge back in if it will not push in easily.  You feel sick to your stomach (nauseous), and that feeling does not go away.  You throw up (vomit), and that keeps happening.  You have a fast heartbeat.  You cannot poop (have a bowel movement) or pass gas. These symptoms may be an emergency. Do not wait to see if the symptoms will go away. Get medical help right away. Call your local emergency services (911 in the [place] Summary  An inguinal hernia is when fat or your intestines push through a weak spot in a muscle where your leg meets your lower belly (groin). This causes a rounded lump (  bulge).  If you do not have symptoms, you may not need treatment. If you have symptoms or a large hernia, you may need surgery.  Avoid lifting heavy objects. Also avoid standing for long amounts of time.  Do not try to force the bulge back in if it will not push in easily. This information is not intended to replace advice given to you by your health care provider. Make sure you discuss any questions you have with your health care provider. Document Released: 02/15/2006 Document Revised: 02/16/2017 Document Reviewed: 10/17/2016 Elsevier Patient Education  2020 [employer].

## 2018-12-17 NOTE — H&P (View-Only) (Signed)
Outpatient Surgical Follow Up  12/17/2018  Timothy Riley is an 78 y.o. male.   Chief Complaint  Patient presents with  . Follow-up    update H&P RIH    HPI:  Timothy Riley is a an 78 year old male well-known to me with a history of right inguinal hernia that is mildly symptomatic.  He experiences intermittent pain that is mild to moderate intensity sharp in nature.  He was already scheduled for surgery but due to the Covid 19 epidemic this was postponed.  He recently also developed a urinary tract infection and is currently on antibiotic for it.  He has been also clear and evaluated by cardiology.  He continues to be in reasonable health and is able to perform more than 4 METS of activity without any shortness of breath or chest pain.    Past Medical History:  Diagnosis Date  . 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 Aortic stenosis    a. 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 Glower MD @ [facility] via R thoracic approach; b. 03/2018 Echo: EF 60-65%, DD. Mildly dil LA. Nl fxn AoV prosthesis. Grad 68mmHg.  . CKD (chronic kidney disease) stage 3, GFR 30-59 ml/min 06/17/2014  . Diabetes mellitus without complication ([place])   . Hypertension   . 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 Low serum HDL 10/26/2015  . Non-obstructive CAD (coronary artery disease)    a. 12/2013 Cath ([facility]): LAD 30p, RI 60, otw nl (per CT surgery note from [place]); b. 03/2018 MV: EF 59%, no ischemia/scar.  . Persistent atrial fibrillation ([place]) 10/2017   a. CHA2DS2VASc = 5-->Eliquis; b. 12/2017 loaded w/amio-->DCCV.  Marland Kitchen PSVT (paroxysmal supraventricular tachycardia) ([place])    a. 05/2014 s/p RFCA @ [facility].  . Right inguinal hernia   . Urinary incontinence     Past Surgical History:  Procedure Laterality Date  . AORTIC VALVE REPLACEMENT    . CARDIAC  CATHETERIZATION    . CARDIOVERSION N/A 01/01/2018   Procedure: CARDIOVERSION;  Surgeon: Minna Merritts, MD;  Location: [facility] ORS;  Service: Cardiovascular;  Laterality: N/A;  . COLONOSCOPY  2010  . heart valve replacment  2015    Family History  Problem Relation Age of Onset  . Cancer Mother   . Stroke Father     Social History:  reports that he quit smoking about 46 years ago. His smoking use included cigarettes. He has a 0.50 pack-year smoking history. He has never used smokeless tobacco. He reports that he does not drink alcohol or use drugs.  Allergies:  Allergies  Allergen Reactions  . Gabapentin     dizzy  . Hydralazine Hcl Rash    Medications reviewed.    ROS Full ROS performed and is otherwise negative other than what is stated in HPI   BP (!) 144/72   Pulse 68   Temp 97.9 F (36.6 C) (Temporal)   Resp 14   Ht 5\' 8"  (1.727 m)   Wt 209 lb 12.8 oz (95.2 kg)   SpO2 97%   BMI 31.90 kg/m   Physical Exam  Physical Exam Constitutional:      General: He is not in acute distress.    Appearance: He is normal weight.  HENT:     Head: Normocephalic.  Eyes:     General: No scleral icterus.  Right eye: No discharge.        Left eye: No discharge.     Extraocular Movements: Extraocular movements intact.  Neck:     Musculoskeletal: Normal range of motion and neck supple. No neck rigidity or muscular tenderness.     Vascular: No carotid bruit.  Cardiovascular:     Rate and Rhythm: Normal rate and regular rhythm.     Pulses: Normal pulses.     Heart sounds: No murmur. No friction rub.  Pulmonary:     Effort: Pulmonary effort is normal. No respiratory distress.     Breath sounds: Normal breath sounds. No stridor.  Abdominal:     General: Abdomen is flat. There is no distension.     Palpations: Abdomen is soft. There is no mass.     Tenderness: There is no abdominal tenderness. There is no guarding or rebound.       Comments: Large RIH reducible   Skin:    General: Skin is warm and dry.     Capillary Refill: Capillary refill takes less than 2 seconds.     Coloration: Skin is not jaundiced.  Neurological:     General: No focal deficit present.     Mental Status: He is alert and oriented to person, place, and time.  Psychiatric:        Mood and Affect: Mood normal.        Behavior: Behavior normal.        Thought Content: Thought content normal.        Judgment: Judgment normal.     Assessment/Plan: Symptomatic right inguinal hernia in need for repair.  Discussed with patient detail about his disease process and I do recommend robotic repair.  Procedure discussed with him.  Risk, benefits and possible complications including but not limited to bleeding, infection recurrence, chronic pain.  We also talked about the chance of having bilateral disease and need for bilateral repairs.  He is in agreement. Greater than 50% of the 25 minutes  visit was spent in counseling/coordination of care   Caroleen Hamman, MD [place] Surgeon

## 2018-12-17 NOTE — Progress Notes (Signed)
Outpatient Surgical Follow Up  12/17/2018  Timothy Riley is an 78 y.o. male.   Chief Complaint  Patient presents with  . Follow-up    update H&P RIH    HPI:  Timothy Riley is a an 78 year old male well-known to me with a history of right inguinal hernia that is mildly symptomatic.  He experiences intermittent pain that is mild to moderate intensity sharp in nature.  He was already scheduled for surgery but due to the Covid 19 epidemic this was postponed.  He recently also developed a urinary tract infection and is currently on antibiotic for it.  He has been also clear and evaluated by cardiology.  He continues to be in reasonable health and is able to perform more than 4 METS of activity without any shortness of breath or chest pain.    Past Medical History:  Diagnosis Date  . 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 Aortic stenosis    a. 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 Glower MD @ [facility] via R thoracic approach; b. 03/2018 Echo: EF 60-65%, DD. Mildly dil LA. Nl fxn AoV prosthesis. Grad 52mmHg.  . CKD (chronic kidney disease) stage 3, GFR 30-59 ml/min 06/17/2014  . Diabetes mellitus without complication ([place])   . Hypertension   . 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 Low serum HDL 10/26/2015  . Non-obstructive CAD (coronary artery disease)    a. 12/2013 Cath ([facility]): LAD 30p, RI 60, otw nl (per CT surgery note from [place]); b. 03/2018 MV: EF 59%, no ischemia/scar.  . Persistent atrial fibrillation ([place]) 10/2017   a. CHA2DS2VASc = 5-->Eliquis; b. 12/2017 loaded w/amio-->DCCV.  Marland Kitchen PSVT (paroxysmal supraventricular tachycardia) ([place])    a. 05/2014 s/p RFCA @ [facility].  . Right inguinal hernia   . Urinary incontinence     Past Surgical History:  Procedure Laterality Date  . AORTIC VALVE REPLACEMENT    . CARDIAC  CATHETERIZATION    . CARDIOVERSION N/A 01/01/2018   Procedure: CARDIOVERSION;  Surgeon: Minna Merritts, MD;  Location: [facility] ORS;  Service: Cardiovascular;  Laterality: N/A;  . COLONOSCOPY  2010  . heart valve replacment  2015    Family History  Problem Relation Age of Onset  . Cancer Mother   . Stroke Father     Social History:  reports that he quit smoking about 46 years ago. His smoking use included cigarettes. He has a 0.50 pack-year smoking history. He has never used smokeless tobacco. He reports that he does not drink alcohol or use drugs.  Allergies:  Allergies  Allergen Reactions  . Gabapentin     dizzy  . Hydralazine Hcl Rash    Medications reviewed.    ROS Full ROS performed and is otherwise negative other than what is stated in HPI   BP (!) 144/72   Pulse 68   Temp 97.9 F (36.6 C) (Temporal)   Resp 14   Ht 5\' 8"  (1.727 m)   Wt 209 lb 12.8 oz (95.2 kg)   SpO2 97%   BMI 31.90 kg/m   Physical Exam  Physical Exam Constitutional:      General: He is not in acute distress.    Appearance: He is normal weight.  HENT:     Head: Normocephalic.  Eyes:     General: No scleral icterus.  Right eye: No discharge.        Left eye: No discharge.     Extraocular Movements: Extraocular movements intact.  Neck:     Musculoskeletal: Normal range of motion and neck supple. No neck rigidity or muscular tenderness.     Vascular: No carotid bruit.  Cardiovascular:     Rate and Rhythm: Normal rate and regular rhythm.     Pulses: Normal pulses.     Heart sounds: No murmur. No friction rub.  Pulmonary:     Effort: Pulmonary effort is normal. No respiratory distress.     Breath sounds: Normal breath sounds. No stridor.  Abdominal:     General: Abdomen is flat. There is no distension.     Palpations: Abdomen is soft. There is no mass.     Tenderness: There is no abdominal tenderness. There is no guarding or rebound.       Comments: Large RIH reducible   Skin:    General: Skin is warm and dry.     Capillary Refill: Capillary refill takes less than 2 seconds.     Coloration: Skin is not jaundiced.  Neurological:     General: No focal deficit present.     Mental Status: He is alert and oriented to person, place, and time.  Psychiatric:        Mood and Affect: Mood normal.        Behavior: Behavior normal.        Thought Content: Thought content normal.        Judgment: Judgment normal.     Assessment/Plan: Symptomatic right inguinal hernia in need for repair.  Discussed with patient detail about his disease process and I do recommend robotic repair.  Procedure discussed with him.  Risk, benefits and possible complications including but not limited to bleeding, infection recurrence, chronic pain.  We also talked about the chance of having bilateral disease and need for bilateral repairs.  He is in agreement. Greater than 50% of the 25 minutes  visit was spent in counseling/coordination of care   Caroleen Hamman, MD [place] Surgeon

## 2018-12-18 ENCOUNTER — Telehealth: Payer: Self-pay | Admitting: Surgery

## 2018-12-18 NOTE — Telephone Encounter (Signed)
Please call patient with the information below.    Surgery Date: 01/08/19 with Dr Kris Mouton assisted right inguinal hernia repair.  Preadmission Testing Date: 12/31/18 @ 12:45pm-office interview. Please advise the patient that he will need to arrive at the [facility] and he will be escorted to the preadmission office.  Covid Testing Date: 01/05/19 between 8-10:30am- - patient advised to go to the [facility] ([place])  [employer] Video sent via [employer] and [employer].  Please make patient aware to call [phone], between 1-3:00pm the day before surgery, to find out what time to arrive.

## 2018-12-18 NOTE — Telephone Encounter (Signed)
Mailbox full unable to leave message.

## 2018-12-22 NOTE — Telephone Encounter (Signed)
Mailbox full, unable to leave message at this time.

## 2018-12-23 ENCOUNTER — Encounter: Payer: Self-pay | Admitting: Family Medicine

## 2018-12-23 ENCOUNTER — Ambulatory Visit (INDEPENDENT_AMBULATORY_CARE_PROVIDER_SITE_OTHER): Payer: Medicare Other | Admitting: Family Medicine

## 2018-12-23 ENCOUNTER — Other Ambulatory Visit: Payer: Self-pay

## 2018-12-23 VITALS — BP 118/58 | HR 66 | Temp 97.3°F | Resp 14 | Wt 208.0 lb

## 2018-12-23 DIAGNOSIS — I10 Essential (primary) hypertension: Secondary | ICD-10-CM

## 2018-12-23 DIAGNOSIS — E782 Mixed hyperlipidemia: Secondary | ICD-10-CM

## 2018-12-23 DIAGNOSIS — N184 Chronic kidney disease, stage 4 (severe): Secondary | ICD-10-CM

## 2018-12-23 DIAGNOSIS — D509 Iron deficiency anemia, unspecified: Secondary | ICD-10-CM

## 2018-12-23 DIAGNOSIS — Z5181 Encounter for therapeutic drug level monitoring: Secondary | ICD-10-CM

## 2018-12-23 DIAGNOSIS — N179 Acute kidney failure, unspecified: Secondary | ICD-10-CM

## 2018-12-23 DIAGNOSIS — R809 Proteinuria, unspecified: Secondary | ICD-10-CM

## 2018-12-23 LAB — POC HEMOCCULT BLD/STL (HOME/3-CARD/SCREEN)
Card #2 Fecal Occult Blod, POC: NEGATIVE
Card #3 Fecal Occult Blood, POC: NEGATIVE
Fecal Occult Blood, POC: NEGATIVE

## 2018-12-23 NOTE — Progress Notes (Signed)
Name: Timothy Riley   MRN: [id number]    DOB: 05-Jun-1940   Date:12/24/2018       Progress Note  Chief Complaint  Patient presents with  . Follow-up    labs     Subjective:   Timothy Riley is a 78 y.o. male, presents to clinic for repeat labs to recheck worsening renal function, also here for routine follow-up on cholesterol blood pressure meds.  He states that he has hydrated been drinking a lot of water every day still drinking about 2 cups of coffee per day but this is significantly improved from what he told me previously.  His urine is now very clear to pale yellow.  He is no longer having any dark urine color, urinary frequency has actually decreased including his nocturia.  He has no dysuria, urinary urgency.  He denies any swelling in his extremities and he feels hydrated.  He is concerned that he is "overmedicated" for his blood pressure.  He continues to state that he is not taking Lasix at all.  He is taking his other medication for his heart and blood pressure per cardiology, currently taking 1/4 tablet of metoprolol for 6.25 mg dose, amiodarone 200 mg, lisinopril 40 mg, norvasc 10 mg. No lightheadedness, hypotension, syncope. Blood pressure today is well controlled / slightly low -reviewed his vital signs flowsheet over the past several years he does tend to fluctuate a little bit with his blood pressures this is the low end of normal for him but he feels like it is too low although he is not having any symptoms. BP Readings from Last 3 Encounters:  12/23/18 (!) 118/58  12/15/18 (!) 144/72  12/12/18 122/64  Pt denies CP, SOB, exertional sx, LE edema, palpitation, Ha's, visual disturbances  Patient states that "years ago" he was sent to nephrology, per chart review it appears that in 2018 he had a nephrology referral.    Patient is continuing to take his Crestor, 10 mg tablet is compliant without any myalgias or concerning side effects.  Patient states that he does  have a date scheduled for inguinal hernia repair he is seen the general surgeons, has Covid testing and preop testing arranged over the next 2 weeks.  We discussed with his worsening renal function, holding off on surgery.  May need to postpone or cancel, patient may also need to see nephrology first for clearance.  His last labs showed some mild anemia he continues to deny melena or hematochezia, fatigue, rapid heart rate cold intolerance or exertional shortness of breath.  He brings in his stool cards today which were tested and were all negative   Patient Active Problem List   Diagnosis Date Noted  . Senile purpura ([place]) 12/12/2018  . AKI (acute kidney injury) ([place]) 12/12/2018  . Anemia 12/12/2018  . Controlled type 2 diabetes mellitus without complication, without long-term current use of insulin ([place]) 11/05/2017  . Arthritis of both feet 11/05/2017  . H/O colonoscopy with polypectomy 08/27/2014  . S/P AVR (aortic valve replacement) 08/24/2014  . 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  . Hypertension goal BP (blood pressure) < 140/90 08/24/2014  . CKD (chronic kidney disease) stage 3, GFR 30-59 ml/min 06/17/2014  . Atrial fibrillation ([place]) 06/17/2014  . H/O paroxysmal supraventricular tachycardia 05/31/2014  . SVT (supraventricular tachycardia) ([place]) 05/29/2014  . Arteriosclerosis of coronary artery 01/11/2014  . 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 Mixed hyperlipidemia 12/01/2013    Past  Surgical History:  Procedure Laterality Date  . AORTIC VALVE REPLACEMENT    . CARDIAC CATHETERIZATION    . CARDIOVERSION N/A 01/01/2018   Procedure: CARDIOVERSION;  Surgeon: Minna Merritts, MD;  Location: [facility] ORS;  Service: Cardiovascular;  Laterality: N/A;  . COLONOSCOPY  2010  . heart valve replacment  2015    Family History  Problem Relation Age of Onset  . Cancer Mother   . Stroke Father     Social History   Socioeconomic  History  . Marital status: Married    Spouse name: betty  . Number of children: 2  . Years of education: 67  . Highest education level: High school graduate  Occupational History  . 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 Financial resource strain: Not hard at all  . Food insecurity    Worry: Never true    Inability: Never true  . Transportation needs    Medical: No    Non-medical: No  Tobacco Use  . Smoking status: Former Smoker    Packs/day: 0.50    Years: 1.00    Pack years: 0.50    Types: Cigarettes    Quit date: 01/30/1972    Years since quitting: 46.9  . Smokeless tobacco: Never Used  . Tobacco comment: over 45 yrs ago  Substance and Sexual Activity  . Alcohol use: No    Alcohol/week: 0.0 standard drinks  . Drug use: No  . Sexual activity: Not Currently  Lifestyle  . Physical activity    Days per week: 0 days    Minutes per session: 0 min  . Stress: Not at all  Relationships  . Social Herbalist on phone: Once a week    Gets together: Once a week    Attends religious service: Never    Active member of club or organization: No    Attends meetings of clubs or organizations: Never    Relationship status: Married  . Intimate partner violence    Fear of current or ex partner: No    Emotionally abused: No    Physically abused: No    Forced sexual activity: Not on file  Other Topics Concern  . Not on file  Social History Narrative  . Not on file     Current Outpatient Medications:  .  amiodarone (PACERONE) 200 MG tablet, TAKE 1 TABLET BY MOUTH ONCE A DAY, Disp: 90 tablet, Rfl: 3 .  amLODipine (NORVASC) 10 MG tablet, TAKE 1 TABLET BY MOUTH ONCE DAILY, Disp: 90 tablet, Rfl: 0 .  amoxicillin (AMOXIL) 500 MG capsule, Take 4 capsules by mouth 30-60 min prior to dental procedure., Disp: 4 capsule, Rfl: 2 .  apixaban (ELIQUIS) 5 MG TABS tablet, Take 5 mg by mouth 2 (two) times daily., Disp: , Rfl:  .  doxazosin (CARDURA) 8 MG tablet,  TAKE 1 TABLET BY MOUTH EVERY NIGHT AT BEDTIME, Disp: 30 tablet, Rfl: 5 .  furosemide (LASIX) 20 MG tablet, Take 1 tablet (20 mg total) by mouth daily as needed., Disp: 90 tablet, Rfl: 1 .  glipiZIDE (GLUCOTROL) 10 MG tablet, TAKE 1 TABLET BY MOUTH TWICE A DAY BEFORE A MEAL. (Patient taking differently: Take 10 mg by mouth 2 (two) times daily before a meal. ), Disp: 180 tablet, Rfl: 0 .  lisinopril (ZESTRIL) 40 MG tablet, TAKE 1 TABLET BY MOUTH ONCE DAILY, Disp: 90 tablet, Rfl: 0 .  metoprolol succinate (TOPROL-XL) 25 MG 24 hr tablet,  Take 0.5 tablets (12.5 mg total) by mouth daily. (Patient taking differently: Take 6.25 mg by mouth at bedtime. Pt is only taking 1/4 of a tablet), Disp: 45 tablet, Rfl: 3 .  pioglitazone (ACTOS) 30 MG tablet, TAKE 1 TABLET BY MOUTH ONCE A DAY, Disp: 90 tablet, Rfl: 0 .  rosuvastatin (CRESTOR) 10 MG tablet, TAKE 1 TABLET BY MOUTH DAILY NEED APPT FOR FURTHER REFILLS, Disp: 90 tablet, Rfl: 1 .  Turmeric Curcumin 500 MG CAPS, Take 500 mg by mouth daily., Disp: , Rfl:   Allergies  Allergen Reactions  . Gabapentin     dizzy  . Hydralazine Hcl Rash    I personally reviewed active problem list, medication list, allergies, family history, social history, health maintenance, notes from last several encounters, lab results, imaging with the patient/caregiver today.  Review of Systems  Constitutional: Negative.   HENT: Negative.   Eyes: Negative.   Respiratory: Negative.   Cardiovascular: Negative.   Gastrointestinal: Negative.   Endocrine: Negative.   Genitourinary: Negative.   Musculoskeletal: Negative.   Skin: Negative.   Allergic/Immunologic: Negative.   Neurological: Negative.   Hematological: Negative.   Psychiatric/Behavioral: Negative.   All other systems reviewed and are negative.    Objective:    Vitals:   12/23/18 1333  BP: (!) 118/58  Pulse: 66  Resp: 14  Temp: (!) 97.3 F (36.3 C)  SpO2: 96%  Weight: 208 lb (94.3 kg)    Body mass index  is 31.63 kg/m.  Physical Exam Vitals signs and nursing note reviewed.  Constitutional:      General: He is not in acute distress.    Appearance: Normal appearance. He is well-developed. He is not ill-appearing, toxic-appearing or diaphoretic.     Interventions: Face mask in place.  HENT:     Head: Normocephalic and atraumatic.     Jaw: No trismus.     Right Ear: External ear normal.     Left Ear: External ear normal.     Mouth/Throat:     Mouth: Mucous membranes are moist.     Pharynx: Oropharynx is clear.  Eyes:     General: Lids are normal. No scleral icterus.    Conjunctiva/sclera: Conjunctivae normal.     Pupils: Pupils are equal, round, and reactive to light.  Neck:     Musculoskeletal: Normal range of motion and neck supple.     Trachea: Trachea and phonation normal. No tracheal deviation.  Cardiovascular:     Rate and Rhythm: Normal rate and regular rhythm.     Pulses: Normal pulses.          Radial pulses are 2+ on the right side and 2+ on the left side.       Posterior tibial pulses are 2+ on the right side and 2+ on the left side.     Heart sounds: Normal heart sounds. No murmur. No friction rub. No gallop.   Pulmonary:     Effort: Pulmonary effort is normal. No respiratory distress.     Breath sounds: Normal breath sounds. No stridor. No wheezing, rhonchi or rales.  Abdominal:     General: Bowel sounds are normal. There is no distension.     Palpations: Abdomen is soft.     Tenderness: There is no abdominal tenderness. There is no guarding or rebound.  Musculoskeletal: Normal range of motion.     Right lower leg: No edema.     Left lower leg: No edema.  Skin:    General:  Skin is warm and dry.     Capillary Refill: Capillary refill takes less than 2 seconds.     Coloration: Skin is not jaundiced.     Findings: No rash.     Nails: There is no clubbing.   Neurological:     Mental Status: He is alert.     Cranial Nerves: No dysarthria or facial asymmetry.      Motor: No weakness, tremor or abnormal muscle tone.     Coordination: Coordination normal.     Gait: Gait normal.  Psychiatric:        Mood and Affect: Mood normal.        Speech: Speech normal.        Behavior: Behavior normal. Behavior is cooperative.      Recent Results (from the past 2160 hour(s))  CMP w GFR     Status: Abnormal   Collection Time: 12/12/18 12:00 AM  Result Value Ref Range   Glucose, Bld 196 (H) 65 - 99 mg/dL    Comment: .            Fasting reference interval . For someone without known diabetes, a glucose value >125 mg/dL indicates that they may have diabetes and this should be confirmed with a follow-up test. .    BUN 32 (H) 7 - 25 mg/dL   Creat 2.09 (H) 0.70 - 1.11 mg/dL    Comment: For patients >19 years of age, the reference limit for Creatinine is approximately 13% higher for people identified as African-American. .    GFR, Est Non African American 29 (L) > OR = 60 mL/min/1.32m2   GFR, Est African American 34 (L) > OR = 60 mL/min/1.39m2   BUN/Creatinine Ratio 15 6 - 22 (calc)   Sodium 141 135 - 146 mmol/L   Potassium 4.0 3.5 - 5.3 mmol/L   Chloride 103 98 - 110 mmol/L   CO2 29 20 - 32 mmol/L   Calcium 9.2 8.6 - 10.3 mg/dL   Total Protein 6.3 6.1 - 8.1 g/dL   Albumin 4.1 3.6 - 5.1 g/dL   Globulin 2.2 1.9 - 3.7 g/dL (calc)   AG Ratio 1.9 1.0 - 2.5 (calc)   Total Bilirubin 0.6 0.2 - 1.2 mg/dL   Alkaline phosphatase (APISO) 73 35 - 144 U/L   AST 35 10 - 35 U/L   ALT 31 9 - 46 U/L  Lipid Panel     Status: Abnormal   Collection Time: 12/12/18 12:00 AM  Result Value Ref Range   Cholesterol 137 <200 mg/dL   HDL 30 (L) > OR = 40 mg/dL   Triglycerides 186 (H) <150 mg/dL   LDL Cholesterol (Calc) 79 mg/dL (calc)    Comment: Reference range: <100 . Desirable range <100 mg/dL for primary prevention;   <70 mg/dL for patients with CHD or diabetic patients  with > or = 2 CHD risk factors. Marland Kitchen LDL-C is now calculated using the Martin-Hopkins   calculation, which is a validated novel method providing  better accuracy than the Friedewald equation in the  estimation of LDL-C.  Cresenciano Genre et al. Annamaria Helling. 1914;782(95): 2061-2068  (http://education.[facility].com/faq/FAQ164)    Total CHOL/HDL Ratio 4.6 <5.0 (calc)   Non-HDL Cholesterol (Calc) 107 <130 mg/dL (calc)    Comment: For patients with diabetes plus 1 major ASCVD risk  factor, treating to a non-HDL-C goal of <100 mg/dL  (LDL-C of <70 mg/dL) is considered a therapeutic  option.   CBC w/ Diff  Status: Abnormal   Collection Time: 12/12/18 12:00 AM  Result Value Ref Range   WBC 6.4 3.8 - 10.8 Thousand/uL   RBC 3.69 (L) 4.20 - 5.80 Million/uL   Hemoglobin 11.3 (L) 13.2 - 17.1 g/dL   HCT 34.2 (L) 38.5 - 50.0 %   MCV 92.7 80.0 - 100.0 fL   MCH 30.6 27.0 - 33.0 pg   MCHC 33.0 32.0 - 36.0 g/dL   RDW 13.1 11.0 - 15.0 %   Platelets 179 140 - 400 Thousand/uL   MPV 11.1 7.5 - 12.5 fL   Neutro Abs 4,813 1,500 - 7,800 cells/uL   Lymphs Abs 973 850 - 3,900 cells/uL   Absolute Monocytes 467 200 - 950 cells/uL   Eosinophils Absolute 109 15 - 500 cells/uL   Basophils Absolute 38 0 - 200 cells/uL   Neutrophils Relative % 75.2 %   Total Lymphocyte 15.2 %   Monocytes Relative 7.3 %   Eosinophils Relative 1.7 %   Basophils Relative 0.6 %  Urine Culture     Status: None   Collection Time: 12/12/18 12:00 AM   Specimen: Urine  Result Value Ref Range   MICRO NUMBER: [id number]    SPECIMEN QUALITY: Adequate    Sample Source URINE    STATUS: FINAL    Result:      Growth of mixed flora was isolated, suggesting probable contamination. No further testing will be performed. If clinically indicated, recollection using a method to minimize contamination, with prompt transfer to Urine Culture Transport Tube, is  recommended.   Urinalysis, microscopic only     Status: Abnormal   Collection Time: 12/12/18 12:00 AM  Result Value Ref Range   WBC, UA 6-10 (A) 0 - 5 /HPF   RBC / HPF > OR =  60 (A) 0 - 2 /HPF   Squamous Epithelial / LPF 10-20 (A) < OR = 5 /HPF   Bacteria, UA NONE SEEN NONE SEEN /HPF   Hyaline Cast NONE SEEN NONE SEEN /LPF  Iron,Total/Total Iron Binding Cap     Status: Abnormal   Collection Time: 12/12/18 12:00 AM  Result Value Ref Range   Iron 83 50 - 180 mcg/dL   TIBC 239 (L) 250 - 425 mcg/dL (calc)   %SAT 35 20 - 48 % (calc)  TEST AUTHORIZATION     Status: None   Collection Time: 12/12/18 12:00 AM  Result Value Ref Range   TEST NAME: IRON AND TOTAL IRON    TEST CODE: [id number]    CLIENT CONTACT: DARNISHA WILLIAMS    REPORT ALWAYS MESSAGE SIGNATURE      Comment: . The laboratory testing on this patient was verbally requested or confirmed by the ordering physician or his or her authorized representative after contact with an employee of [employer]. Federal regulations require that we maintain on file written authorization for all laboratory testing.  Accordingly we are asking that the ordering physician or his or her authorized representative sign a copy of this report and promptly return it to the client service representative. . . Signature:____________________________________________________ . Please fax this signed page to [phone] or return it via your [employer] courier.   POCT A1C     Status: Abnormal   Collection Time: 12/12/18  2:14 PM  Result Value Ref Range   Hemoglobin A1C 6.8 (A) 4.0 - 5.6 %   HbA1c POC (<> result, manual entry)     HbA1c, POC (prediabetic range)     HbA1c, POC (controlled diabetic range)  POCT UA - Microalbumin     Status: Abnormal   Collection Time: 12/12/18  2:14 PM  Result Value Ref Range   Microalbumin Ur, POC 100 mg/L   Creatinine, POC     Albumin/Creatinine Ratio, Urine, POC    POCT UA dip     Status: Abnormal   Collection Time: 12/12/18  2:15 PM  Result Value Ref Range   Color, UA drk    Clarity, UA cloudy    Glucose, UA Negative Negative   Bilirubin, UA neg    Ketones, UA neg     Spec Grav, UA 1.025 1.010 - 1.025   Blood, UA positve    pH, UA 7.5 5.0 - 8.0   Protein, UA Positive (A) Negative   Urobilinogen, UA 0.2 0.2 or 1.0 E.U./dL   Nitrite, UA neg    Leukocytes, UA Trace (A) Negative   Appearance clear    Odor normal   Microalbumin, urine     Status: None   Collection Time: 12/23/18 12:00 AM  Result Value Ref Range   Microalb, Ur 8.0 mg/dL    Comment: Reference Range Not established    RAM      Comment: . The ADA defines abnormalities in albumin excretion as follows: Marland Kitchen Category         Result (mcg/mg creatinine) . Normal                    <30 Microalbuminuria         30-299  Clinical albuminuria   > OR = 300 . The ADA recommends that at least two of three specimens collected within a 3-6 month period be abnormal before considering a patient to be within a diagnostic category.   Lipid Panel     Status: Abnormal   Collection Time: 12/23/18 12:00 AM  Result Value Ref Range   Cholesterol 135 <200 mg/dL   HDL 28 (L) > OR = 40 mg/dL   Triglycerides 185 (H) <150 mg/dL   LDL Cholesterol (Calc) 79 mg/dL (calc)    Comment: Reference range: <100 . Desirable range <100 mg/dL for primary prevention;   <70 mg/dL for patients with CHD or diabetic patients  with > or = 2 CHD risk factors. Marland Kitchen LDL-C is now calculated using the Martin-Hopkins  calculation, which is a validated novel method providing  better accuracy than the Friedewald equation in the  estimation of LDL-C.  Cresenciano Genre et al. Annamaria Helling. 2947;654(65): 2061-2068  (http://education.[facility].com/faq/FAQ164)    Total CHOL/HDL Ratio 4.8 <5.0 (calc)   Non-HDL Cholesterol (Calc) 107 <130 mg/dL (calc)    Comment: For patients with diabetes plus 1 major ASCVD risk  factor, treating to a non-HDL-C goal of <100 mg/dL  (LDL-C of <70 mg/dL) is considered a therapeutic  option.   CMP w GFR     Status: Abnormal   Collection Time: 12/23/18 12:00 AM  Result Value Ref Range   Glucose, Bld 118  (H) 65 - 99 mg/dL    Comment: .            Fasting reference interval . For someone without known diabetes, a glucose value between 100 and 125 mg/dL is consistent with prediabetes and should be confirmed with a follow-up test. .    BUN 44 (H) 7 - 25 mg/dL   Creat 2.57 (H) 0.70 - 1.11 mg/dL    Comment: For patients >19 years of age, the reference limit for Creatinine is approximately 13% higher for people identified as  African-American. .    GFR, Est Non African American 23 (L) > OR = 60 mL/min/1.86m2   GFR, Est African American 26 (L) > OR = 60 mL/min/1.59m2   BUN/Creatinine Ratio 17 6 - 22 (calc)   Sodium 141 135 - 146 mmol/L   Potassium 3.8 3.5 - 5.3 mmol/L   Chloride 103 98 - 110 mmol/L   CO2 30 20 - 32 mmol/L   Calcium 8.9 8.6 - 10.3 mg/dL   Total Protein 6.1 6.1 - 8.1 g/dL   Albumin 3.9 3.6 - 5.1 g/dL   Globulin 2.2 1.9 - 3.7 g/dL (calc)   AG Ratio 1.8 1.0 - 2.5 (calc)   Total Bilirubin 0.6 0.2 - 1.2 mg/dL   Alkaline phosphatase (APISO) 79 35 - 144 U/L   AST 43 (H) 10 - 35 U/L   ALT 41 9 - 46 U/L  POC Hemoccult Bld/Stl (3-Cd Home Screen)     Status: Normal   Collection Time: 12/23/18  1:23 PM  Result Value Ref Range   Card #1 Date 12/23/2018    Fecal Occult Blood, POC Negative Negative   Card #2 Date 12/21/2018    Card #2 Fecal Occult Blod, POC Negative    Card #3 Date 12/20/2018    Card #3 Fecal Occult Blood, POC Negative      PHQ2/9: Depression screen [facility] 2/9 12/23/2018 12/12/2018 05/01/2018 01/27/2018 11/05/2017  Decreased Interest 0 0 0 0 0  Down, Depressed, Hopeless 0 1 0 0 0  PHQ - 2 Score 0 1 0 0 0  Altered sleeping 0 0 0 1 0  Tired, decreased energy 0 0 0 3 0  Change in appetite 0 0 0 0 0  Feeling bad or failure about yourself  0 0 0 0 0  Trouble concentrating 0 0 0 1 0  Moving slowly or fidgety/restless 0 0 0 0 0  Suicidal thoughts 0 0 0 0 0  PHQ-9 Score 0 1 0 5 0  Difficult doing work/chores Not difficult at all Not difficult at all Not difficult  at all Not difficult at all Not difficult at all    phq 9 is negative Reviewed today   Fall Risk: Fall Risk  12/23/2018 12/15/2018 12/15/2018 12/12/2018 05/01/2018  Falls in the past year? 0 0 0 0 0  Number falls in past yr: 0 - - 0 0  Injury with Fall? 0 - - 0 0  Follow up - - - - -      Functional Status Survey: Is the patient deaf or have difficulty hearing?: No Does the patient have difficulty seeing, even when wearing glasses/contacts?: No Does the patient have difficulty concentrating, remembering, or making decisions?: No Does the patient have difficulty walking or climbing stairs?: No Does the patient have difficulty dressing or bathing?: No Does the patient have difficulty doing errands alone such as visiting a doctor's office or shopping?: No    Assessment & Plan:    1. AKI (acute kidney injury) ([place]) aki vs worsening CKD? Prerenal? ATN?   Urine color improved, pt pushing fluids, appears euvolemic, he is concerned BP is too low, I agree that we can decrease meds esp with decreased renal function.  Plan to take 1/2 tab lisinopril and monitor BP, recheck renal function - adjust meds based off GFR - hoping to see improvement with improved hydration.   - Microalbumin, urine - CMP w GFR  2. Microcytic anemia Pt brought back stool cards with last visit and labs showing  microcytic anemia, neg, no GI blood loss - POC Hemoccult Bld/Stl (3-Cd Home Screen); Future - POC Hemoccult Bld/Stl (3-Cd Home Screen)  3. CKD (chronic kidney disease) stage 4, GFR 15-29 ml/min ([facility]) Worsening renal function -I did consult with my colleagues here in the clinic and with our pharmacist, did a more thorough chart review did see in 2018 he was referred to Dr. Singh/nephrology patient states it was many years ago that he saw a kidney doctor.  Recheck renal function and proteinuria today - Microalbumin, urine - CMP w GFR  4. Hypertension goal BP (blood pressure) < 140/90 BP soft goal for blood  pressure I would be more comfortable with 120/60-140/80 -decrease meds, see above Monitor blood pressure at home and close follow-up - Lipid Panel - CMP w GFR  5. Encounter for medication monitoring - Microalbumin, urine - CMP w GFR  6. Mixed hyperlipidemia Tolerating his medications - CMP w GFR  7. Proteinuria, unspecified type - Microalbumin, urine - CMP w GFR  -DM  -on Actos and glipizide\ Lab Results  Component Value Date   HGBA1C 6.8 (A) 12/12/2018  Blood sugar well controlled   Discussed with the patient today his upcoming surgery, if renal function continues to decline I did tell him that I would send a message to his surgeons to postpone his surgery patient will need nephrology evaluation and clearance before prior to any surgeries/procedures.  Return in about 2 weeks (around 01/06/2019) for bp recheck with med changes.   Delsa Grana, PA-C 12/24/18 10:24 AM

## 2018-12-23 NOTE — Telephone Encounter (Signed)
Tried reaching patient again, unsuccessful, mail box full.

## 2018-12-23 NOTE — Patient Instructions (Addendum)
For the next week please take only HALF of your lisinopril. Take 1/2 of 40 mg lisinopril tablet, for 20 mg dose daily and monitor your blood pressure at home if able.  Recheck here in 2 weeks.  I would feel comfortable with your blood pressure between 120/60 and 140/80.     If your kidney function does not show improvement with your efforts to hydrate - then I will refer you to see the kidney specialist.

## 2018-12-23 NOTE — Telephone Encounter (Signed)
Patient came by office and I printed out the instructions for him in regards to surgery and ask to call the office for any questions or concerns

## 2018-12-24 LAB — COMPLETE METABOLIC PANEL WITH GFR
AG Ratio: 1.8 (calc) (ref 1.0–2.5)
ALT: 41 U/L (ref 9–46)
AST: 43 U/L — ABNORMAL HIGH (ref 10–35)
Albumin: 3.9 g/dL (ref 3.6–5.1)
Alkaline phosphatase (APISO): 79 U/L (ref 35–144)
BUN/Creatinine Ratio: 17 (calc) (ref 6–22)
BUN: 44 mg/dL — ABNORMAL HIGH (ref 7–25)
CO2: 30 mmol/L (ref 20–32)
Calcium: 8.9 mg/dL (ref 8.6–10.3)
Chloride: 103 mmol/L (ref 98–110)
Creat: 2.57 mg/dL — ABNORMAL HIGH (ref 0.70–1.11)
GFR, Est African American: 26 mL/min/{1.73_m2} — ABNORMAL LOW (ref >=60)
GFR, Est Non African American: 23 mL/min/{1.73_m2} — ABNORMAL LOW (ref >=60)
Globulin: 2.2 g/dL (calc) (ref 1.9–3.7)
Glucose, Bld: 118 mg/dL — ABNORMAL HIGH (ref 65–99)
Potassium: 3.8 mmol/L (ref 3.5–5.3)
Sodium: 141 mmol/L (ref 135–146)
Total Bilirubin: 0.6 mg/dL (ref 0.2–1.2)
Total Protein: 6.1 g/dL (ref 6.1–8.1)

## 2018-12-24 LAB — MICROALBUMIN, URINE: Microalb, Ur: 8 mg/dL

## 2018-12-24 LAB — LIPID PANEL
Cholesterol: 135 mg/dL (ref <200)
HDL: 28 mg/dL — ABNORMAL LOW (ref >=40)
LDL Cholesterol (Calc): 79 mg/dL (calc)
Non-HDL Cholesterol (Calc): 107 mg/dL (calc) (ref <130)
Total CHOL/HDL Ratio: 4.8 (calc) (ref <5.0)
Triglycerides: 185 mg/dL — ABNORMAL HIGH (ref <150)

## 2018-12-24 NOTE — Progress Notes (Signed)
Results for orders placed or performed in visit on 12/23/18  Microalbumin, urine  Result Value Ref Range   Microalb, Ur 8.0 mg/dL   RAM    Lipid Panel  Result Value Ref Range   Cholesterol 135 <200 mg/dL   HDL 28 (L) > OR = 40 mg/dL   Triglycerides 185 (H) <150 mg/dL   LDL Cholesterol (Calc) 79 mg/dL (calc)   Total CHOL/HDL Ratio 4.8 <5.0 (calc)   Non-HDL Cholesterol (Calc) 107 <130 mg/dL (calc)  CMP w GFR  Result Value Ref Range   Glucose, Bld 118 (H) 65 - 99 mg/dL   BUN 44 (H) 7 - 25 mg/dL   Creat 2.57 (H) 0.70 - 1.11 mg/dL   GFR, Est Non African American 23 (L) > OR = 60 mL/min/1.40m2   GFR, Est African American 26 (L) > OR = 60 mL/min/1.37m2   BUN/Creatinine Ratio 17 6 - 22 (calc)   Sodium 141 135 - 146 mmol/L   Potassium 3.8 3.5 - 5.3 mmol/L   Chloride 103 98 - 110 mmol/L   CO2 30 20 - 32 mmol/L   Calcium 8.9 8.6 - 10.3 mg/dL   Total Protein 6.1 6.1 - 8.1 g/dL   Albumin 3.9 3.6 - 5.1 g/dL   Globulin 2.2 1.9 - 3.7 g/dL (calc)   AG Ratio 1.8 1.0 - 2.5 (calc)   Total Bilirubin 0.6 0.2 - 1.2 mg/dL   Alkaline phosphatase (APISO) 79 35 - 144 U/L   AST 43 (H) 10 - 35 U/L   ALT 41 9 - 46 U/L  POC Hemoccult Bld/Stl (3-Cd Home Screen)  Result Value Ref Range   Card #1 Date 12/23/2018    Fecal Occult Blood, POC Negative Negative   Card #2 Date 12/21/2018    Card #2 Fecal Occult Blod, POC Negative    Card #3 Date 12/20/2018    Card #3 Fecal Occult Blood, POC Negative    Unfortunately renal function worse referral put in for nephrology and a message was sent to Dr. Juleen China  Pt instructed after labs resulted to hold lisinopril completely.  He is not taking lasix.   Will forward to cardiology and consult with my SP as well. Surgery will be notified.    12/24/18 11:50 AM Delsa Grana, PA-C

## 2018-12-24 NOTE — Addendum Note (Signed)
Addended by: Delsa Grana on: 12/24/2018 11:59 AM   Modules accepted: Orders

## 2018-12-30 ENCOUNTER — Encounter: Payer: Self-pay | Admitting: Pharmacist

## 2018-12-30 ENCOUNTER — Other Ambulatory Visit: Payer: Self-pay

## 2018-12-30 DIAGNOSIS — I10 Essential (primary) hypertension: Secondary | ICD-10-CM

## 2018-12-30 MED ORDER — AMLODIPINE BESYLATE 10 MG PO TABS: 10.0000 mg | ORAL_TABLET | Freq: Every day | ORAL | 1 refills | Status: DC

## 2018-12-30 NOTE — Telephone Encounter (Signed)
I have called patient to advise him that Dr Dahlia Byes will not be able to perform surgery on 01/08/19. Patient did not want to wait any longer to have surgery. I suggested that one of our other providers, Dr Christian Mate, could perform the surgery. Surgery date has changed from 01/08/19 with Dr Dahlia Byes to 01/07/19 with Dr Christian Mate.   Pt has been advised of pre admission date/time, Covid Testing date and Surgery date.  Surgery Date: 01/07/19 with Dr Wyline Beady assisted right inguinal hernia repair.  Preadmission Testing Date: 12/31/18 @ 12:45pm. Pt advised to arrive at the [facility].  Covid Testing Date: 01/05/19 between 8-10:30am - patient advised to go to the [place] ([place])  [employer] Video sent via [employer] [facility] and [employer].  Patient has been made aware to call [phone], between 1-3:00pm the day before surgery, to find out what time to arrive.

## 2018-12-31 ENCOUNTER — Encounter
Admission: RE | Admit: 2018-12-31 | Discharge: 2018-12-31 | Disposition: A | Discharge status: Home / Self Care | Payer: Medicare Other | Source: Ambulatory Visit | Attending: Surgery | Admitting: Surgery

## 2018-12-31 ENCOUNTER — Other Ambulatory Visit: Payer: Self-pay

## 2018-12-31 DIAGNOSIS — Z01812 Encounter for preprocedural laboratory examination: Secondary | ICD-10-CM | POA: Insufficient documentation

## 2018-12-31 LAB — APTT: aPTT: 42 seconds — ABNORMAL HIGH (ref 24–36)

## 2018-12-31 LAB — PROTIME-INR
INR: 1.4 — ABNORMAL HIGH (ref 0.8–1.2)
Prothrombin Time: 17.3 seconds — ABNORMAL HIGH (ref 11.4–15.2)

## 2018-12-31 NOTE — Patient Instructions (Signed)
Your COVID swab is scheduled on: Monday 01/05/2019.  Drive up in front of [address] any time 8:00-10:30 am and remain in your vehicle.  Your procedure is scheduled on: Wednesday 01/07/2019 Report to Same Day Surgery 2nd floor [facility] Entrance-take elevator on left to 2nd floor.  Check in with surgery information desk.) To find out your arrival time, call [phone] 1:00-3:00 PM on Tuesday 01/06/2019  Remember: Instructions that are not followed completely may result in serious medical risk, up to and including death, or upon the discretion of your surgeon and anesthesiologist your surgery may need to be rescheduled.    __x__ 1. Do not eat food (including mints, candies, chewing gum) after midnight the night before your procedure. You may drink clear liquids up to 2 hours before you are scheduled to arrive at the hospital for your procedure.  Do not drink anything within 2 hours of your scheduled arrival to the hospital.  Approved clear liquids:  --Water or Apple juice without pulp  --Clear carbohydrate beverage such as Gatorade or Powerade  --Black Coffee or Clear Tea (No milk, no creamers, do not add anything to the coffee or tea)    __x__ 2. No Alcohol for 24 hours before or after surgery.   __x__ 3. No Smoking or e-cigarettes for 24 hours before surgery.  Do not use any chewable tobacco products for at least 6 hours before surgery.   __x__ 4. Notify your doctor if there is any change in your medical condition (cold, fever, infections).   __x__ 5. On the morning of surgery brush your teeth with toothpaste and water.  You may rinse your mouth with mouthwash if you wish.  Do not swallow any toothpaste or mouthwash.  Please read over the following fact sheets that you were given:   [facility] Preparing for Surgery and/or MRSA Information    __x__ Use CHG Soap as directed on instruction sheet.   Do not wear jewelry, lotions, powders, deodorant, or perfumes  on the day of surgery.  Do not shave below the face/neck 48 hours prior to surgery.   Do not bring valuables to the hospital.  [facility] is not responsible for any belongings or valuables.               Glasses may not be worn into surgery.  For patients discharged on the day of surgery, you will NOT be permitted to drive yourself home.  You must have a responsible adult with you for 24 hours after surgery.  __x__ Take these medicines on the morning of surgery with a SMALL SIP OF WATER:  1. Amiodarone (Pacerone)  2. Amlodipine (Norvasc)  3. Metoprolol (Toprol)  4. Rosuvastatin (Crestor)  Skip your Pioglitazone (Actos) and Glipizide (Glucotrol) ONLY ON THE MORNING OF SURGERY.  You don't need to stop these medicines ahead of time.  __x__ Follow recommendations from Cardiologist, Pulmonologist or PCP regarding stopping blood thinners such as Eliquis, Aspirin, Coumadin, Plavix, Effient, Pradaxa, and Pletal.    __x__ STARTING TODAY: Do not take any Anti-inflammatories such as Advil, Ibuprofen, Motrin, Aleve, Naproxen, Naprosyn, BC/Goodies powders or aspirin products. It is okay to take Tylenol if needed for pain.   __x__ STARTING TODAY: Stop over the counter supplements (Turmeric) until after surgery.

## 2019-01-01 ENCOUNTER — Other Ambulatory Visit: Payer: Self-pay | Admitting: Nephrology

## 2019-01-01 DIAGNOSIS — N1832 Chronic kidney disease, stage 3b: Secondary | ICD-10-CM | POA: Diagnosis not present

## 2019-01-01 DIAGNOSIS — N179 Acute kidney failure, unspecified: Secondary | ICD-10-CM

## 2019-01-01 DIAGNOSIS — R3129 Other microscopic hematuria: Secondary | ICD-10-CM

## 2019-01-01 DIAGNOSIS — R600 Localized edema: Secondary | ICD-10-CM | POA: Diagnosis not present

## 2019-01-05 ENCOUNTER — Other Ambulatory Visit: Payer: Self-pay

## 2019-01-05 ENCOUNTER — Other Ambulatory Visit
Admission: RE | Admit: 2019-01-05 | Discharge: 2019-01-05 | Disposition: A | Discharge status: Home / Self Care | Payer: Medicare Other | Source: Ambulatory Visit | Attending: Surgery | Admitting: Surgery

## 2019-01-05 DIAGNOSIS — K4090 Unilateral inguinal hernia, without obstruction or gangrene, not specified as recurrent: Secondary | ICD-10-CM | POA: Diagnosis not present

## 2019-01-05 DIAGNOSIS — I4819 Other persistent atrial fibrillation: Secondary | ICD-10-CM | POA: Diagnosis not present

## 2019-01-05 DIAGNOSIS — M1990 Unspecified osteoarthritis, unspecified site: Secondary | ICD-10-CM | POA: Diagnosis not present

## 2019-01-05 DIAGNOSIS — N183 Chronic kidney disease, stage 3 unspecified: Secondary | ICD-10-CM | POA: Diagnosis not present

## 2019-01-05 DIAGNOSIS — E1151 Type 2 diabetes mellitus with diabetic peripheral angiopathy without gangrene: Secondary | ICD-10-CM | POA: Diagnosis not present

## 2019-01-05 DIAGNOSIS — Z87891 Personal history of nicotine dependence: Secondary | ICD-10-CM | POA: Diagnosis not present

## 2019-01-05 DIAGNOSIS — E1122 Type 2 diabetes mellitus with diabetic chronic kidney disease: Secondary | ICD-10-CM | POA: Diagnosis not present

## 2019-01-05 DIAGNOSIS — Z953 Presence of xenogenic heart valve: Secondary | ICD-10-CM | POA: Diagnosis not present

## 2019-01-05 DIAGNOSIS — E782 Mixed hyperlipidemia: Secondary | ICD-10-CM | POA: Diagnosis not present

## 2019-01-05 DIAGNOSIS — Z888 Allergy status to other drugs, medicaments and biological substances status: Secondary | ICD-10-CM | POA: Diagnosis not present

## 2019-01-05 DIAGNOSIS — I129 Hypertensive chronic kidney disease with stage 1 through stage 4 chronic kidney disease, or unspecified chronic kidney disease: Secondary | ICD-10-CM | POA: Diagnosis not present

## 2019-01-05 DIAGNOSIS — Z20828 Contact with and (suspected) exposure to other viral communicable diseases: Secondary | ICD-10-CM | POA: Diagnosis not present

## 2019-01-05 DIAGNOSIS — I2510 Atherosclerotic heart disease of native coronary artery without angina pectoris: Secondary | ICD-10-CM | POA: Diagnosis not present

## 2019-01-05 LAB — SARS CORONAVIRUS 2 (TAT 6-24 HRS): SARS Coronavirus 2: NEGATIVE

## 2019-01-07 ENCOUNTER — Other Ambulatory Visit: Payer: Self-pay

## 2019-01-07 ENCOUNTER — Ambulatory Visit: Payer: Medicare Other | Admitting: Registered Nurse

## 2019-01-07 ENCOUNTER — Ambulatory Visit: Payer: Medicare Other | Admitting: Family Medicine

## 2019-01-07 ENCOUNTER — Ambulatory Visit
Admission: RE | Admit: 2019-01-07 | Discharge: 2019-01-07 | Disposition: A | Discharge status: Home / Self Care | Payer: Medicare Other | Attending: Surgery | Admitting: Surgery

## 2019-01-07 ENCOUNTER — Encounter
Admission: RE | Disposition: A | Discharge status: Home / Self Care | Payer: Self-pay | Source: Home / Self Care | Attending: Surgery

## 2019-01-07 DIAGNOSIS — N183 Chronic kidney disease, stage 3 unspecified: Secondary | ICD-10-CM | POA: Diagnosis not present

## 2019-01-07 DIAGNOSIS — I2510 Atherosclerotic heart disease of native coronary artery without angina pectoris: Secondary | ICD-10-CM | POA: Insufficient documentation

## 2019-01-07 DIAGNOSIS — E1122 Type 2 diabetes mellitus with diabetic chronic kidney disease: Secondary | ICD-10-CM | POA: Diagnosis not present

## 2019-01-07 DIAGNOSIS — Z953 Presence of xenogenic heart valve: Secondary | ICD-10-CM | POA: Insufficient documentation

## 2019-01-07 DIAGNOSIS — I129 Hypertensive chronic kidney disease with stage 1 through stage 4 chronic kidney disease, or unspecified chronic kidney disease: Secondary | ICD-10-CM | POA: Insufficient documentation

## 2019-01-07 DIAGNOSIS — E1151 Type 2 diabetes mellitus with diabetic peripheral angiopathy without gangrene: Secondary | ICD-10-CM | POA: Insufficient documentation

## 2019-01-07 DIAGNOSIS — Z87891 Personal history of nicotine dependence: Secondary | ICD-10-CM | POA: Diagnosis not present

## 2019-01-07 DIAGNOSIS — E782 Mixed hyperlipidemia: Secondary | ICD-10-CM | POA: Diagnosis not present

## 2019-01-07 DIAGNOSIS — M1990 Unspecified osteoarthritis, unspecified site: Secondary | ICD-10-CM | POA: Insufficient documentation

## 2019-01-07 DIAGNOSIS — Z20828 Contact with and (suspected) exposure to other viral communicable diseases: Secondary | ICD-10-CM | POA: Diagnosis not present

## 2019-01-07 DIAGNOSIS — I4819 Other persistent atrial fibrillation: Secondary | ICD-10-CM | POA: Diagnosis not present

## 2019-01-07 DIAGNOSIS — K4090 Unilateral inguinal hernia, without obstruction or gangrene, not specified as recurrent: Secondary | ICD-10-CM | POA: Diagnosis not present

## 2019-01-07 DIAGNOSIS — Z888 Allergy status to other drugs, medicaments and biological substances status: Secondary | ICD-10-CM | POA: Insufficient documentation

## 2019-01-07 HISTORY — PX: XI ROBOTIC ASSISTED INGUINAL HERNIA REPAIR WITH MESH: SHX6706

## 2019-01-07 LAB — GLUCOSE, CAPILLARY
Glucose-Capillary: 167 mg/dL — ABNORMAL HIGH (ref 70–99)
Glucose-Capillary: 164 mg/dL — ABNORMAL HIGH (ref 70–99)

## 2019-01-07 SURGERY — REPAIR, HERNIA, INGUINAL, ROBOT-ASSISTED, LAPAROSCOPIC, USING MESH
Anesthesia: General | Site: Groin | Laterality: Right

## 2019-01-07 MED ORDER — CELECOXIB 200 MG PO CAPS
ORAL_CAPSULE | ORAL | Status: AC
  Administered 2019-01-07: 14:00:00 200 mg via ORAL
  Filled 2019-01-07: qty 1

## 2019-01-07 MED ORDER — SODIUM CHLORIDE 0.9 % IV SOLN
INTRAVENOUS | Status: DC
  Administered 2019-01-07: 14:00:00 via INTRAVENOUS

## 2019-01-07 MED ORDER — OXYCODONE HCL 5 MG PO TABS: 5.0000 mg | ORAL_TABLET | Freq: Once | ORAL | Status: DC | PRN

## 2019-01-07 MED ORDER — FAMOTIDINE 20 MG PO TABS: 20.0000 mg | ORAL_TABLET | Freq: Once | ORAL | Status: AC

## 2019-01-07 MED ORDER — ACETAMINOPHEN 500 MG PO TABS
ORAL_TABLET | ORAL | Status: AC
  Administered 2019-01-07: 14:00:00 1000 mg via ORAL
  Filled 2019-01-07: qty 2

## 2019-01-07 MED ORDER — CHLORHEXIDINE GLUCONATE CLOTH 2 % EX PADS
6.0000 | MEDICATED_PAD | Freq: Once | CUTANEOUS | Status: AC
  Administered 2019-01-07: 6 via TOPICAL

## 2019-01-07 MED ORDER — LIDOCAINE HCL (CARDIAC) PF 100 MG/5ML IV SOSY
PREFILLED_SYRINGE | INTRAVENOUS | Status: DC | PRN
  Administered 2019-01-07: 60 mg via INTRATRACHEAL

## 2019-01-07 MED ORDER — GLYCOPYRROLATE 0.2 MG/ML IJ SOLN
INTRAMUSCULAR | Status: DC | PRN
  Administered 2019-01-07: 0.2 mg via INTRAVENOUS

## 2019-01-07 MED ORDER — LIDOCAINE HCL (PF) 2 % IJ SOLN
INTRAMUSCULAR | Status: AC
  Filled 2019-01-07: qty 10

## 2019-01-07 MED ORDER — CEFAZOLIN SODIUM-DEXTROSE 2-4 GM/100ML-% IV SOLN
INTRAVENOUS | Status: AC
  Filled 2019-01-07: qty 100

## 2019-01-07 MED ORDER — PROPOFOL 10 MG/ML IV BOLUS
INTRAVENOUS | Status: DC | PRN
  Administered 2019-01-07: 90 mg via INTRAVENOUS

## 2019-01-07 MED ORDER — SUGAMMADEX SODIUM 200 MG/2ML IV SOLN
INTRAVENOUS | Status: DC | PRN
  Administered 2019-01-07: 200 mg via INTRAVENOUS

## 2019-01-07 MED ORDER — CELECOXIB 200 MG PO CAPS: 200.0000 mg | ORAL_CAPSULE | ORAL | Status: AC

## 2019-01-07 MED ORDER — ACETAMINOPHEN 500 MG PO TABS: 1000.0000 mg | ORAL_TABLET | ORAL | Status: AC

## 2019-01-07 MED ORDER — FENTANYL CITRATE (PF) 100 MCG/2ML IJ SOLN: 25.0000 ug | INTRAMUSCULAR | Status: DC | PRN

## 2019-01-07 MED ORDER — BUPIVACAINE-EPINEPHRINE (PF) 0.25% -1:200000 IJ SOLN
INTRAMUSCULAR | Status: DC | PRN
  Administered 2019-01-07: 15 mL via PERINEURAL

## 2019-01-07 MED ORDER — BUPIVACAINE-EPINEPHRINE (PF) 0.25% -1:200000 IJ SOLN
INTRAMUSCULAR | Status: AC
  Filled 2019-01-07: qty 30

## 2019-01-07 MED ORDER — ONDANSETRON HCL 4 MG/2ML IJ SOLN
INTRAMUSCULAR | Status: DC | PRN
  Administered 2019-01-07: 4 mg via INTRAVENOUS

## 2019-01-07 MED ORDER — HYDROCODONE-ACETAMINOPHEN 5-325 MG PO TABS: 1.0000 | ORAL_TABLET | ORAL | 0 refills | Status: DC | PRN

## 2019-01-07 MED ORDER — PROMETHAZINE HCL 25 MG/ML IJ SOLN: 6.2500 mg | INTRAMUSCULAR | Status: DC | PRN

## 2019-01-07 MED ORDER — DEXAMETHASONE SODIUM PHOSPHATE 10 MG/ML IJ SOLN
INTRAMUSCULAR | Status: DC | PRN
  Administered 2019-01-07: 10 mg via INTRAVENOUS

## 2019-01-07 MED ORDER — FAMOTIDINE 20 MG PO TABS
ORAL_TABLET | ORAL | Status: AC
  Administered 2019-01-07: 20 mg via ORAL
  Filled 2019-01-07: qty 1

## 2019-01-07 MED ORDER — OXYCODONE HCL 5 MG/5ML PO SOLN: 5.0000 mg | Freq: Once | ORAL | Status: DC | PRN

## 2019-01-07 MED ORDER — MEPERIDINE HCL 50 MG/ML IJ SOLN: 6.2500 mg | INTRAMUSCULAR | Status: DC | PRN

## 2019-01-07 MED ORDER — PROPOFOL 10 MG/ML IV BOLUS
INTRAVENOUS | Status: AC
  Filled 2019-01-07: qty 20

## 2019-01-07 MED ORDER — FENTANYL CITRATE (PF) 250 MCG/5ML IJ SOLN
INTRAMUSCULAR | Status: DC | PRN
  Administered 2019-01-07 (×3): 50 ug via INTRAVENOUS

## 2019-01-07 MED ORDER — FENTANYL CITRATE (PF) 250 MCG/5ML IJ SOLN
INTRAMUSCULAR | Status: AC
  Filled 2019-01-07: qty 5

## 2019-01-07 MED ORDER — ROCURONIUM BROMIDE 100 MG/10ML IV SOLN
INTRAVENOUS | Status: DC | PRN
  Administered 2019-01-07: 30 mg via INTRAVENOUS
  Administered 2019-01-07: 20 mg via INTRAVENOUS

## 2019-01-07 MED ORDER — DEXAMETHASONE SODIUM PHOSPHATE 10 MG/ML IJ SOLN
INTRAMUSCULAR | Status: AC
  Filled 2019-01-07: qty 1

## 2019-01-07 MED ORDER — CEFAZOLIN SODIUM-DEXTROSE 2-4 GM/100ML-% IV SOLN
2.0000 g | INTRAVENOUS | Status: AC
  Administered 2019-01-07: 2 g via INTRAVENOUS

## 2019-01-07 MED ORDER — ROCURONIUM BROMIDE 50 MG/5ML IV SOLN
INTRAVENOUS | Status: AC
  Filled 2019-01-07: qty 1

## 2019-01-07 MED ORDER — ONDANSETRON HCL 4 MG/2ML IJ SOLN
INTRAMUSCULAR | Status: AC
  Filled 2019-01-07: qty 2

## 2019-01-07 SURGICAL SUPPLY — 42 items
BLADE CLIPPER SURG (BLADE) ×2 IMPLANT
CANISTER SUCT 1200ML W/VALVE (MISCELLANEOUS) ×2 IMPLANT
CHLORAPREP W/TINT 26 (MISCELLANEOUS) ×2 IMPLANT
COVER TIP SHEARS 8 DVNC (MISCELLANEOUS) ×1 IMPLANT
COVER TIP SHEARS 8MM DA VINCI (MISCELLANEOUS) ×1
COVER WAND RF STERILE (DRAPES) ×2 IMPLANT
DEFOGGER SCOPE WARMER CLEARIFY (MISCELLANEOUS) ×2 IMPLANT
DERMABOND ADVANCED (GAUZE/BANDAGES/DRESSINGS) ×1
DERMABOND ADVANCED .7 DNX12 (GAUZE/BANDAGES/DRESSINGS) ×1 IMPLANT
DRAPE 3/4 80X56 (DRAPES) ×2 IMPLANT
DRAPE ARM DVNC X/XI (DISPOSABLE) ×3 IMPLANT
DRAPE COLUMN DVNC XI (DISPOSABLE) ×1 IMPLANT
DRAPE DA VINCI XI ARM (DISPOSABLE) ×3
DRAPE DA VINCI XI COLUMN (DISPOSABLE) ×1
ELECT REM PT RETURN 9FT ADLT (ELECTROSURGICAL) ×2
ELECTRODE REM PT RTRN 9FT ADLT (ELECTROSURGICAL) ×1 IMPLANT
GLOVE ORTHO TXT STRL SZ7.5 (GLOVE) ×6 IMPLANT
GOWN STRL REUS W/ TWL LRG LVL3 (GOWN DISPOSABLE) ×3 IMPLANT
GOWN STRL REUS W/TWL LRG LVL3 (GOWN DISPOSABLE) ×3
GRASPER SUT TROCAR 14GX15 (MISCELLANEOUS) ×2 IMPLANT
IRRIGATION STRYKERFLOW (MISCELLANEOUS) IMPLANT
IRRIGATOR STRYKERFLOW (MISCELLANEOUS)
IV CATH ANGIO 14GX1.88 NO SAFE (IV SOLUTION) ×2 IMPLANT
IV NS 1000ML (IV SOLUTION)
IV NS 1000ML BAXH (IV SOLUTION) IMPLANT
KIT PINK PAD W/HEAD ARE REST (MISCELLANEOUS) ×2
KIT PINK PAD W/HEAD ARM REST (MISCELLANEOUS) ×1 IMPLANT
LABEL OR SOLS (LABEL) ×2 IMPLANT
MESH 3DMAX LIGHT 4.1X6.2 RT LR (Mesh General) ×2 IMPLANT
NEEDLE HYPO 22GX1.5 SAFETY (NEEDLE) ×2 IMPLANT
NEEDLE INSUFFLATION 14GA 120MM (NEEDLE) ×2 IMPLANT
PACK LAP CHOLECYSTECTOMY (MISCELLANEOUS) ×2 IMPLANT
SEAL CANN UNIV 5-8 DVNC XI (MISCELLANEOUS) ×3 IMPLANT
SEAL XI 5MM-8MM UNIVERSAL (MISCELLANEOUS) ×3
SET TUBE SMOKE EVAC HIGH FLOW (TUBING) ×2 IMPLANT
SOLUTION ELECTROLUBE (MISCELLANEOUS) ×2 IMPLANT
SUT MNCRL AB 4-0 PS2 18 (SUTURE) ×2 IMPLANT
SUT VIC AB 0 CT1 36 (SUTURE) ×2 IMPLANT
SUT VIC AB 2-0 SH 27 (SUTURE) ×1
SUT VIC AB 2-0 SH 27XBRD (SUTURE) ×1 IMPLANT
SUT VICRYL 0 AB UR-6 (SUTURE) ×2 IMPLANT
SUT VLOC 90 S/L VL9 GS22 (SUTURE) ×2 IMPLANT

## 2019-01-07 NOTE — Discharge Instructions (Signed)
AMBULATORY SURGERY  DISCHARGE INSTRUCTIONS   1) The drugs that you were given will stay in your system until tomorrow so for the next 24 hours you should not:  A) Drive an automobile B) Make any legal decisions C) Drink any alcoholic beverage   2) You may resume regular meals tomorrow.  Today it is better to start with liquids and gradually work up to solid foods.  You may eat anything you prefer, but it is better to start with liquids, then soup and crackers, and gradually work up to solid foods.   3) Please notify your doctor immediately if you have any unusual bleeding, trouble breathing, redness and pain at the surgery site, drainage, fever, or pain not relieved by medication.    4) Additional Instructions:        Please contact your physician with any problems or Same Day Surgery at [phone], Monday through Friday 6 am to 4 pm, or [place] at [facility] number at [phone].Laparoscopic Inguinal Hernia Repair, Adult, Care After This sheet gives you information about how to care for yourself after your procedure. Your health care provider may also give you more specific instructions. If you have problems or questions, contact your health care provider. What can I expect after the procedure? After the procedure, it is common to have:  Pain.  Swelling and bruising around the incision area.  Scrotal swelling, in men.  Some fluid or blood draining from your incisions. Follow these instructions at home: Incision care  Follow instructions from your health care provider about how to take care of your incisions. Make sure you: ? Wash your hands with soap and water before you change your bandage (dressing). If soap and water are not available, use hand sanitizer. ? Change your dressing as told by your health care provider. ? Leave stitches (sutures), skin glue, or adhesive strips in place. These skin closures may need to stay in place for 2 weeks or longer. If  adhesive strip edges start to loosen and curl up, you may trim the loose edges. Do not remove adhesive strips completely unless your health care provider tells you to do that.  Check your incision area every day for signs of infection. Check for: ? More redness, swelling, or pain. ? More fluid or blood. ? Warmth. ? Pus or a bad smell.  Wear loose, soft clothing while your incisions heal. Driving  Do not drive or use heavy machinery while taking prescription pain medicine.  Do not drive for 24 hours if you were given a medicine to help you relax (sedative) during your procedure. Activity  Do not lift anything that is heavier than 10 lb (4.5 kg), or the limit that you are told, until your health care provider says that it is safe.  Ask your health care provider what activities are safe for you.A lot of activity during the first week after surgery can increase pain and swelling. For 1 week after your procedure: ? Avoid activities that take a lot of effort, such as exercise or sports. ? You may walk and climb stairs as needed for daily activity, but avoid long walks or climbing stairs for exercise. Managing pain and swelling   Put ice on painful or swollen areas: ? Put ice in a plastic bag. ? Place a towel between your skin and the bag. ? Leave the ice on for 20 minutes, 2-3 times a day. General instructions  Do not take baths, swim, or use a hot tub until  your health care provider approves. Ask your health care provider if you may take showers. You may only be allowed to take sponge baths.  Take over-the-counter and prescription medicines only as told by your health care provider.  To prevent or treat constipation while you are taking prescription pain medicine, your health care provider may recommend that you: ? Drink enough fluid to keep your urine pale yellow. ? Take over-the-counter or prescription medicines. ? Eat foods that are high in fiber, such as fresh fruits and  vegetables, whole grains, and beans. ? Limit foods that are high in fat and processed sugars, such as fried and sweet foods.  Do not use any products that contain nicotine or tobacco, such as cigarettes and e-cigarettes. If you need help quitting, ask your health care provider.  Drink enough fluid to keep your urine pale yellow.  Keep all follow-up visits as told by your health care provider. This is important. Contact a health care provider if:  You have more redness, swelling, or pain around your incisions or your groin area.  You have more swelling in your scrotum.  You have more fluid or blood coming from your incisions.  Your incisions feel warm to the touch.  You have severe pain and medicines do not help.  You have abdominal pain or swelling.  You cannot eat or drink without vomiting.  You cannot urinate or pass a bowel movement.  You faint.  You feel dizzy.  You have nausea and vomiting.  You have a fever. Get help right away if:  You have pus or a bad smell coming from your incisions.  You have chest pain.  You have problems breathing. Summary  Pain, swelling, and bruising are common after the procedure.  Check your incision area every day for signs of infection, such as more redness, swelling, or pain.  Put ice on painful or swollen areas for 20 minutes, 2-3 times a day. This information is not intended to replace advice given to you by your health care provider. Make sure you discuss any questions you have with your health care provider. Document Released: 04/26/2016 Document Revised: 01/18/2017 Document Reviewed: 04/26/2016 Elsevier Patient Education  [place] incision was closed with Dermabond.  It is best to keep it clean and dry, it will tolerate a brief shower, but do not soak it or apply any creams or lotions to the incisions.  The Dermabond should gradually flake off over time.  Keep it open to air so you can evaluate your  incisions.  Dermabond assists the underlying sutures to keep your incision closed and protected from infection.  Should you develop some drainage from your incision, some drops of drainage would be okay but if it persists continue to put keep a dry dressing over it.

## 2019-01-07 NOTE — Anesthesia Procedure Notes (Signed)
Procedure Name: Intubation Date/Time: 01/07/2019 3:41 PM Performed by: Nelda Marseille, CRNA Pre-anesthesia Checklist: Patient identified, Patient being monitored, Timeout performed, Emergency Drugs available and Suction available Patient Re-evaluated:Patient Re-evaluated prior to induction Oxygen Delivery Method: Circle system utilized Preoxygenation: Pre-oxygenation with 100% oxygen Induction Type: IV induction Ventilation: Mask ventilation without difficulty Laryngoscope Size: Mac, 3 and McGraph Grade View: Grade II Tube type: Oral Tube size: 7.5 mm Number of attempts: 1 Airway Equipment and Method: Stylet and Video-laryngoscopy Placement Confirmation: ETT inserted through vocal cords under direct vision,  positive ETCO2 and breath sounds checked- equal and bilateral Secured at: 21 cm Tube secured with: Tape Dental Injury: Teeth and Oropharynx as per pre-operative assessment

## 2019-01-07 NOTE — Transfer of Care (Signed)
Immediate Anesthesia Transfer of Care Note  Patient: Timothy Riley [facility]  Procedure(s) Performed: XI ROBOTIC ASSISTED INGUINAL HERNIA REPAIR WITH MESH (Right Groin)  Patient Location: PACU  Anesthesia Type:General  Level of Consciousness: sedated  Airway & Oxygen Therapy: Patient connected to face mask oxygen  Post-op Assessment: Post -op Vital signs reviewed and stable  Post vital signs: stable  Last Vitals:  Vitals Value Taken Time  BP 146/62 01/07/19 1634  Temp    Pulse 59 01/07/19 1634  Resp 15 01/07/19 1634  SpO2 98 % 01/07/19 1634  Vitals shown include unvalidated device data.  Last Pain:  Vitals:   01/07/19 1407  TempSrc: Temporal  PainSc: 0-No pain         Complications: No apparent anesthesia complications

## 2019-01-07 NOTE — Progress Notes (Signed)
Heart rate 52-55   No new orders from dr fitzgerald

## 2019-01-07 NOTE — Interval H&P Note (Signed)
History and Physical Interval Note:  01/07/2019 2:35 PM  Timothy Riley  has presented today for surgery, with the diagnosis of RIGHT INGUINAL HERNIA.  The various methods of treatment have been discussed with the patient and family. After consideration of risks, benefits and other options for treatment, the patient has consented to  Procedure(s): XI ROBOTIC [place] (Right) as a surgical intervention.  The patient's history has been reviewed, patient examined, no change in status, stable for surgery.  I have reviewed the patient's chart and labs.  Questions were answered to the patient's satisfaction.     Ronny Bacon

## 2019-01-07 NOTE — Anesthesia Post-op Follow-up Note (Signed)
Anesthesia QCDR form completed.        

## 2019-01-07 NOTE — Anesthesia Postprocedure Evaluation (Signed)
Anesthesia Post Note  Patient: Timothy Riley [facility]  Procedure(s) Performed: XI ROBOTIC ASSISTED INGUINAL HERNIA REPAIR WITH MESH (Right Groin)  Patient location during evaluation: PACU Anesthesia Type: General Level of consciousness: awake and alert Pain management: pain level controlled Vital Signs Assessment: post-procedure vital signs reviewed and stable Respiratory status: spontaneous breathing, nonlabored ventilation and respiratory function stable Cardiovascular status: blood pressure returned to baseline and stable Postop Assessment: no apparent nausea or vomiting Anesthetic complications: no     Last Vitals:  Vitals:   01/07/19 1731 01/07/19 1813  BP: (!) 147/54 140/64  Pulse: (!) 55 61  Resp: 16 16  Temp: (!) 36.3 C   SpO2: 92% 99%    Last Pain:  Vitals:   01/07/19 1813  TempSrc:   PainSc: [place]

## 2019-01-07 NOTE — Anesthesia Preprocedure Evaluation (Signed)
Anesthesia Evaluation  Patient identified by MRN, date of birth, ID band Patient awake    Reviewed: Allergy & Precautions, NPO status , Patient's Chart, lab work & pertinent test results  History of Anesthesia Complications Negative for: history of anesthetic complications  Airway Mallampati: II  TM Distance: >3 FB Neck ROM: Full    Dental  (+) Poor Dentition   Pulmonary neg sleep apnea, neg COPD, former smoker,    breath sounds clear to auscultation- rhonchi (-) wheezing      Cardiovascular hypertension, + Peripheral Vascular Disease  (-) CAD, (-) Past MI and (-) Cardiac Stents + dysrhythmias Supra Ventricular Tachycardia  Rhythm:Regular Rate:Normal - Systolic murmurs and - Diastolic murmurs Echo 8/65/78: 1. The left ventricle has normal systolic function with an ejection fraction of 60-65%. The cavity size was normal. There is mildly increased left ventricular wall thickness. Left ventricular diastolic Doppler parameters are consistent with impaired  relaxation.  2. The right ventricle has normal systolic function. The cavity was normal. There is no increase in right ventricular wall thickness.  3. Left atrial size was mildly dilated.  4. 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 Procedure Date: 03/01/14 Normal aortic valve prosthesis. AV Mean Grad: 16.0 mmHg  5. Right atrial pressure is estimated at 10 mmHg.   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 01/2014 s/p AVR with 23 mm Carpentier-Edwards               pericardial tissue valve by D. Glower MD @ [facility] via R               thoracic approach; b. 03/2018 Echo: EF 60-65%, DD. Mildly               dil LA. Nl fxn AoV prosthesis. Grad 53mmHg. 06/17/2014: CKD (chronic kidney disease) stage 3, GFR 30-59 ml/min No date: Diabetes mellitus without complication (HCC) No date: Hypertension 12/01/2013: Hypertensive left ventricular hypertrophy 03/04/2014: Incomplete RBBB 10/26/2015: Low serum HDL No date: Non-obstructive CAD (coronary artery disease)     Comment:  a. 12/2013 Cath ([facility]): LAD 30p, RI 60, otw nl (per CT               surgery note from [place]); b. 03/2018 MV: EF 59%, no               ischemia/scar. 10/2017: Persistent atrial fibrillation ([place])     Comment:  a. CHA2DS2VASc = 5-->Eliquis; b. 12/2017 loaded               w/amio-->DCCV. No date: PSVT (paroxysmal supraventricular tachycardia) ([place])     Comment:  a. 05/2014 s/p RFCA @ [facility]. No date: Right inguinal hernia No date: Urinary incontinence   Reproductive/Obstetrics  Anesthesia Physical Anesthesia Plan  ASA: III  Anesthesia Plan: General   Post-op Pain Management:    Induction: Intravenous  PONV Risk Score and Plan: 1 and Ondansetron and Dexamethasone  Airway Management Planned: Oral ETT  Additional Equipment:   Intra-op Plan:   Post-operative Plan: Extubation in OR  Informed Consent: I have reviewed the patients History and Physical, chart, labs and discussed the procedure including the risks, benefits and alternatives for the proposed anesthesia with the patient or authorized representative who has indicated his/her understanding and acceptance.     Dental advisory given  Plan Discussed with: CRNA and Anesthesiologist  Anesthesia Plan Comments:         Anesthesia Quick  Evaluation

## 2019-01-07 NOTE — Brief Op Note (Signed)
01/07/2019  4:26 PM  PATIENT:  Timothy Timothy  78 y.o. male  PRE-OPERATIVE DIAGNOSIS:  RIGHT INGUINAL HERNIA  POST-OPERATIVE DIAGNOSIS:  RIGHT INGUINAL HERNIA  PROCEDURE:  Procedure(s): XI ROBOTIC ASSISTED INGUINAL HERNIA REPAIR WITH MESH (Right)  SURGEON:  Surgeon(s) and Role:    * Ronny Bacon, MD - Primary  PHYSICIAN ASSISTANT:   ASSISTANTS: none   ANESTHESIA:   general  EBL:  MINIMAL   BLOOD ADMINISTERED:none  DRAINS: none   LOCAL MEDICATIONS USED:  MARCAINE     SPECIMEN:  No Specimen  DISPOSITION OF SPECIMEN:  N/A  COUNTS:  YES  TOURNIQUET:  * No tourniquets in log *  DICTATION: .Other Dictation: Dictation Number [id number] UNAVAILABLE AT PRESENT.   PLAN OF CARE: Discharge to home after PACU  PATIENT DISPOSITION:  PACU - hemodynamically stable.   Delay start of Pharmacological VTE agent (>24hrs) due to surgical blood loss or risk of bleeding: yes

## 2019-01-07 NOTE — Progress Notes (Signed)
Spoke to pharmacy regarding Celebrex and creatinine clearance. Pharmacist stated one time dose pre-op is okay but not recommended for long term use.

## 2019-01-08 NOTE — Op Note (Signed)
Robotic assisted Laparoscopic Transabdominal Right Inguinal Hernia Repair with Mesh       Pre-operative Diagnosis:  Right  Inguinal Hernia   Post-operative Diagnosis: Same   Procedure: Robotic Laparoscopic  repair of right inguinal hernia(s)   Surgeon: Ronny Bacon, MD FACS   Anesthesia: Gen. with endotracheal tube   Findings: right  inguinal hernia, no  evidence of left sided hernia, sub-centimeter umbilical fascial defect.         Procedure Details  The patient was seen again in the Holding Room. The benefits, complications, treatment options, and expected outcomes were discussed with the patient. The risks of bleeding, infection, recurrence of symptoms, failure to resolve symptoms, recurrence of hernia, ischemic orchitis, chronic pain syndrome or neuroma, were reviewed again. The likelihood of improving the patient's symptoms with return to their baseline status is good.  The patient and/or family concurred with the proposed plan, giving informed consent.  The patient was taken to Operating Room, identified  and the procedure verified as Laparoscopic Inguinal Hernia Repair. Laterality confirmed.  A Time Out was held and the above information confirmed.   Prior to the induction of general anesthesia, antibiotic prophylaxis was administered. VTE prophylaxis was in place. General endotracheal anesthesia was then administered and tolerated well. After the induction, the abdomen was prepped with Chloraprep and draped in the sterile fashion. The patient was positioned in the supine position.   After local infiltration of quarter percent Marcaine with epinephrine, stab incision was made left upper quadrant.  Just below the costal margin approximately midclavicular line the Veress needle is passed with sensation of the layers to penetrate the abdominal wall and into the peritoneum.  Saline drop test is confirmed peritoneal placement.  Insufflation is initiated with carbon dioxide to pressures of  15 mmHg. An 8.5 mm port is placed via the umbilical fascial defect, with blunt tipped trocar.  Pneumoperitoneum maintained w/o HD changes. No evidence of bowel injuries.  Two 8.5 mm ports placed under direct vision. The laparoscopy revealed large right indirect defect and the bowel within it reduced with Trendelenburg and palpation to groin.  The robot was brought ot the table and docked in the standard fashion, no collision between arms was observed. Instruments were kept under direct view at all times. For right inguinal hernia repair,  I developed a peritoneal flap. The sac(s) were reduced and dissected free from adjacent structures. We preserved the vas and the vessels, and visualized them to their convergence and beyond in the retroperitoneum. Once dissection was completed a large right sided BARD 3D Light mesh was placed and secured at three points with interrupted 2-0 Vicryl to the pubic tubercle and anteriorly. There was good coverage of the direct, indirect and femoral spaces.  A large angiocath is placed under direct visualization in the groin to reduce trapped extraperitoneal air and confirm adequate peritoneal closure.  Second look revealed no complications or injuries.  The flap was closed with 2-0 V-lock suture. The umbilical defect was closed the the PMI underdirect visualization using 0-Vicryl.  Once assuring that hemostasis was adequate the ports were removed.  4-0 subcuticular Monocryl was used at all skin edges. Dermabond was placed.  Patient tolerated the procedure well. There were no complications. He was taken to the recovery room in stable condition.           Ronny Bacon, MD, FACS

## 2019-01-09 ENCOUNTER — Ambulatory Visit: Payer: Medicare Other

## 2019-01-12 ENCOUNTER — Ambulatory Visit (INDEPENDENT_AMBULATORY_CARE_PROVIDER_SITE_OTHER): Payer: Medicare Other | Admitting: Surgery

## 2019-01-12 ENCOUNTER — Encounter: Payer: Self-pay | Admitting: Surgery

## 2019-01-12 ENCOUNTER — Telehealth: Payer: Self-pay | Admitting: Surgery

## 2019-01-12 ENCOUNTER — Other Ambulatory Visit: Payer: Self-pay

## 2019-01-12 VITALS — BP 126/71 | HR 77 | Temp 97.7°F | Resp 14 | Ht 68.0 in | Wt 203.6 lb

## 2019-01-12 DIAGNOSIS — Z09 Encounter for follow-up examination after completed treatment for conditions other than malignant neoplasm: Secondary | ICD-10-CM

## 2019-01-12 NOTE — Patient Instructions (Signed)
Please continue to apply ice pack throughout the day.    You will need to wear tight underwear to keep your testicles from hanging loosely.

## 2019-01-12 NOTE — Telephone Encounter (Signed)
Patient has called stating that his right testicle is very swollen. Patient did have a right inguinal hernia repair done by Dr Christian Mate on 01/07/19. I did inform him that some swelling is normal. He states that the swelling is increasing rather than decreasing. He has been applying ice to the area, no pain, no nausea or vomiting or fever. Please call patient back with any other recommendations.

## 2019-01-12 NOTE — Telephone Encounter (Signed)
Right Inguinal repair 01/07/2019 Dr.Rodenberg.  Unable to leave message, mailbox is full. Called all numbers available.

## 2019-01-12 NOTE — Telephone Encounter (Signed)
Patient coming in today to be seen by Dr Dahlia Byes.

## 2019-01-13 ENCOUNTER — Encounter: Payer: Self-pay | Admitting: Surgery

## 2019-01-13 NOTE — Progress Notes (Signed)
S/p robotic RIH by dr. Dorothy Spark in because increase in size of scrotum No fevers or chills  Taking po  PE NAD Abd: soft, nt, no recurrence. Incisions healing well Testis w hydrocele on the right side, no evidence of torsion or ischemia.  A/P Doing well reassure pt and son about benign findings Elevate scrotum Ice and supportive underwear No evidence of complications

## 2019-01-15 ENCOUNTER — Encounter: Payer: Medicare Other | Admitting: Surgery

## 2019-01-15 ENCOUNTER — Ambulatory Visit: Payer: Medicare Other | Attending: Nephrology

## 2019-01-26 ENCOUNTER — Other Ambulatory Visit: Payer: Self-pay

## 2019-01-26 ENCOUNTER — Ambulatory Visit
Admission: RE | Admit: 2019-01-26 | Discharge: 2019-01-26 | Disposition: A | Discharge status: Home / Self Care | Payer: Medicare Other | Source: Ambulatory Visit | Attending: Nephrology | Admitting: Nephrology

## 2019-01-26 ENCOUNTER — Ambulatory Visit: Payer: Medicare Other

## 2019-01-26 DIAGNOSIS — N281 Cyst of kidney, acquired: Secondary | ICD-10-CM | POA: Diagnosis not present

## 2019-01-26 DIAGNOSIS — R3129 Other microscopic hematuria: Secondary | ICD-10-CM

## 2019-01-26 DIAGNOSIS — N179 Acute kidney failure, unspecified: Secondary | ICD-10-CM | POA: Diagnosis not present

## 2019-01-27 ENCOUNTER — Other Ambulatory Visit: Payer: Self-pay | Admitting: Family Medicine

## 2019-01-28 ENCOUNTER — Other Ambulatory Visit: Payer: Self-pay

## 2019-01-28 DIAGNOSIS — E119 Type 2 diabetes mellitus without complications: Secondary | ICD-10-CM

## 2019-01-28 MED ORDER — GLIPIZIDE 10 MG PO TABS: ORAL_TABLET | ORAL | 0 refills | Status: DC

## 2019-02-02 ENCOUNTER — Telehealth: Payer: Self-pay | Admitting: Cardiovascular Disease

## 2019-02-02 ENCOUNTER — Other Ambulatory Visit: Payer: Self-pay | Admitting: Physician Assistant

## 2019-02-02 NOTE — Telephone Encounter (Signed)
lmov to schedule  °

## 2019-02-02 NOTE — Telephone Encounter (Signed)
-----   Message from Horton Finer sent at 02/02/2019  1:19 PM EST ----- Please schedule overdue F/U appointment for this patient. Thank you!

## 2019-02-03 NOTE — Telephone Encounter (Signed)
-----   Message from Horton Finer sent at 02/02/2019  1:19 PM EST ----- Please schedule overdue F/U appointment for this patient. Thank you!

## 2019-02-03 NOTE — Telephone Encounter (Signed)
Attempted to schedule.  

## 2019-02-03 NOTE — Telephone Encounter (Signed)
Called to schedule, unable to lvm

## 2019-02-05 DIAGNOSIS — R768 Other specified abnormal immunological findings in serum: Secondary | ICD-10-CM | POA: Diagnosis not present

## 2019-02-05 DIAGNOSIS — R801 Persistent proteinuria, unspecified: Secondary | ICD-10-CM | POA: Diagnosis not present

## 2019-02-05 DIAGNOSIS — N184 Chronic kidney disease, stage 4 (severe): Secondary | ICD-10-CM | POA: Diagnosis not present

## 2019-02-05 DIAGNOSIS — R600 Localized edema: Secondary | ICD-10-CM | POA: Diagnosis not present

## 2019-02-12 NOTE — Progress Notes (Signed)
This encounter was created in error - please disregard.

## 2019-02-22 NOTE — Progress Notes (Signed)
Cardiology Office Note  Date:  02/24/2019   ID:  Timothy Riley, DOB Dec 07, 1940, MRN [id number]  PCP:  Delsa Grana, PA-C   Chief Complaint  Patient presents with  . office visit    3 month F/U; Meds verbally reviewed with patient.    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 at [facility] with bovine valve,  SVT episodes since his AVR, requiring ablation 06/30/2014 at [facility],  diabetes type 2 with most recent hemoglobin A1c 7.3,  BPH,  normal ejection fraction  who presents for routine follow-up of his bioprosthetic valve, hypertension , atrial fibrillation  In follow-up today reports he feels fine, no tachycardia or palpitations concerning for arrhythmia Continues on metoprolol succinate 12.5 daily , amiodarone 200 with now lower dose Eliquis 2.5 twice daily (We have adjust his his medication list today which read 5 mg twice daily)  Wife passed away She was living in [place], had debility, memory issues  He reports that he is doing no regular exercise Might have trace little bit of ankle swelling  Lab work reviewed with him, dramatic change in his renal function over the past year " Because I am dehydrated" CR 2.57, BUN 44 Total chol 135, LDL 79 HAB1C 6.8  Echo 03/2018, reviewed with him  1. The left ventricle has normal systolic function with an ejection fraction of 60-65%. The cavity size was normal. There is mildly increased left ventricular wall thickness. Left ventricular diastolic Doppler parameters are consistent with impaired  relaxation.  2. The right ventricle has normal systolic function. The cavity was normal. There is no increase in right ventricular wall thickness.  3. Left atrial size was mildly dilated.  4. 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 Procedure Date: 03/01/14 Normal aortic valve prosthesis.  AV Mean Grad: 16.0 mmHg  5. Right atrial pressure is estimated at 10 mmHg  EKG personally reviewed by myself on todays visit Shows sinus bradycardia rate 59 bpm right bundle branch block  Other past medical history reviewed [facility] Majestic to the emergency room November 05, 2017 atrial fibrillation,  Started on anticoagulation at that time, eliquis 5 BID   hypotensive on a previous clinic visit, stopped his tamsulosin, chlorthalidone  Previous notes from outside cardiologist says he has mild coronary artery disease, details not available through care everywhere Followed by endocrine for his diabetes  Prior episodes of SVT requiring adenosine    PMH:   has a past medical history of 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  PSH:    Past Surgical History:  Procedure Laterality Date  . AORTIC VALVE REPLACEMENT    . CARDIAC CATHETERIZATION    . CARDIOVERSION N/A 01/01/2018   Procedure: CARDIOVERSION;  Surgeon: Minna Merritts, MD;  Location: [facility] ORS;  Service: Cardiovascular;  Laterality: N/A;  . COLONOSCOPY  2010  . heart valve replacment  2015  . XI ROBOTIC ASSISTED INGUINAL HERNIA REPAIR WITH MESH Right 01/07/2019   Procedure: XI ROBOTIC ASSISTED INGUINAL HERNIA REPAIR WITH MESH;  Surgeon: Ronny Bacon, MD;  Location: [facility]  ORS;  Service: General;  Laterality: Right;    Current Outpatient Medications  Medication Sig Dispense Refill  . amiodarone (PACERONE) 200 MG tablet TAKE 1 TABLET BY MOUTH ONCE A DAY 90  tablet 3  . amLODipine (NORVASC) 10 MG tablet Take 1 tablet (10 mg total) by mouth daily. 90 tablet 1  . amoxicillin (AMOXIL) 500 MG capsule Take 4 capsules by mouth 30-60 min prior to dental procedure. 4 capsule 2  . apixaban (ELIQUIS) 5 MG TABS tablet Take 5 mg by mouth 2 (two) times daily.    Marland Kitchen doxazosin (CARDURA) 8 MG tablet TAKE 1 TABLET BY MOUTH EVERY NIGHT AT BEDTIME 30 tablet 5  . glipiZIDE (GLUCOTROL) 10 MG tablet TAKE 1 TABLET BY MOUTH TWICE A DAY BEFORE A MEAL. 180 tablet 0  . metoprolol succinate (TOPROL-XL) 25 MG 24 hr tablet TAKE 1/2 (12.5 MG TOTAL) BY MOUTH DAILY 15 tablet 0  . pioglitazone (ACTOS) 30 MG tablet TAKE 1 TABLET BY MOUTH ONCE DAILY 90 tablet 0  . Turmeric Curcumin 500 MG CAPS Take 500 mg by mouth daily.     No current facility-administered medications for this visit.     Allergies:   Gabapentin, Hydralazine, and Hydralazine hcl   Social History:  The patient  reports that he quit smoking about 47 years ago. His smoking use included cigarettes. He has a 0.50 pack-year smoking history. He has never used smokeless tobacco. He reports that he does not drink alcohol or use drugs.   Family History:   family history includes Cancer in his mother; Stroke in his father.    Review of Systems: Review of Systems  Constitutional: Negative.   HENT: Negative.   Respiratory: Negative.   Cardiovascular: Negative.   Gastrointestinal: Negative.   Musculoskeletal: Negative.   Neurological: Negative.   Psychiatric/Behavioral: Negative.   All other systems reviewed and are negative.   PHYSICAL EXAM: VS:  BP 140/60 (BP Location: Left Arm, Patient Position: Sitting, Cuff Size: Normal)   Pulse (!) 59   Temp 98.1 F (36.7 C)   Ht 5\' 8"  (1.727 m)   Wt 209 lb 8 oz (95 kg)   SpO2 96%   BMI 31.85 kg/m  , BMI Body mass index is 31.85 kg/m. Constitutional:  oriented to person, place, and time. No distress.  HENT:  Head: Grossly normal Eyes:  no discharge. No scleral  icterus.  Neck: No JVD, no carotid bruits  Cardiovascular: Regular rate and rhythm, no murmurs appreciated Pulmonary/Chest: Clear to auscultation bilaterally, no wheezes or rails Abdominal: Soft.  no distension.  no tenderness.  Musculoskeletal: Normal range of motion Neurological:  normal muscle tone. Coordination normal. No atrophy Skin: Skin warm and dry Psychiatric: normal affect, pleasant    Recent Labs: 12/12/2018: Hemoglobin 11.3; Platelets 179 12/23/2018: ALT 41; BUN 44; Creat 2.57; Potassium 3.8; Sodium 141    Lipid Panel Lab Results  Component Value Date   CHOL 135 12/23/2018   HDL 28 (L) 12/23/2018   LDLCALC 79 12/23/2018   TRIG 185 (H) 12/23/2018      Wt Readings from Last 3 Encounters:  02/24/19 209 lb 8 oz (95 kg)  01/12/19 203 lb 9.6 oz (92.4 kg)  12/31/18 206 lb 11.2 oz (93.8 kg)     ASSESSMENT AND PLAN:  Atrial fibrillation, persistent normal sinus rhythm  Prior cardioversion   low-dose amiodarone, metoprolol 12.5 daily Eliquis 5 twice daily  Acute on chronic renal failure Followed by nephrology, creatinine greater than 2 Reiterated to him  to avoid nephrotoxic medications and over-the-counter supplements/NSAIDs Stay hydrated  Hypertension goal BP (blood pressure) < 140/90 -  Blood pressure borderline elevated after long walk into the office today, recommend he monitor blood pressure at home  Arteriosclerosis of coronary artery - Plan: EKG 12-Lead Currently with no symptoms of angina. No further workup at this time. Continue current medication regimen.  S/P AVR (aortic valve replacement) - Plan: EKG 12-Lead Echocardiogram 2020 stable prosthetic valve  Hyperlipidemia Previously on Crestor 10, appears this is no longer on his list We will call him to inquire   Total encounter time more than 25 minutes  Greater than 50% was spent in counseling and coordination of care with the patient   Disposition:   F/U  12 month   No orders of the  defined types were placed in this encounter.    Signed, Esmond Plants, M.D., Ph.D. 02/24/2019  [place], [place]

## 2019-02-24 ENCOUNTER — Ambulatory Visit (INDEPENDENT_AMBULATORY_CARE_PROVIDER_SITE_OTHER): Payer: Medicare Other | Admitting: Cardiovascular Disease

## 2019-02-24 ENCOUNTER — Other Ambulatory Visit: Payer: Self-pay

## 2019-02-24 ENCOUNTER — Telehealth: Payer: Self-pay | Admitting: *Deleted

## 2019-02-24 ENCOUNTER — Encounter: Payer: Self-pay | Admitting: Cardiovascular Disease

## 2019-02-24 VITALS — BP 140/60 | HR 59 | Temp 98.1°F | Ht 68.0 in | Wt 209.5 lb

## 2019-02-24 DIAGNOSIS — I4819 Other persistent atrial fibrillation: Secondary | ICD-10-CM | POA: Diagnosis not present

## 2019-02-24 DIAGNOSIS — R079 Chest pain, unspecified: Secondary | ICD-10-CM

## 2019-02-24 DIAGNOSIS — N183 Chronic kidney disease, stage 3 unspecified: Secondary | ICD-10-CM | POA: Diagnosis not present

## 2019-02-24 DIAGNOSIS — I10 Essential (primary) hypertension: Secondary | ICD-10-CM | POA: Diagnosis not present

## 2019-02-24 DIAGNOSIS — Z953 Presence of xenogenic heart valve: Secondary | ICD-10-CM | POA: Diagnosis not present

## 2019-02-24 DIAGNOSIS — E782 Mixed hyperlipidemia: Secondary | ICD-10-CM

## 2019-02-24 DIAGNOSIS — I2510 Atherosclerotic heart disease of native coronary artery without angina pectoris: Secondary | ICD-10-CM | POA: Diagnosis not present

## 2019-02-24 DIAGNOSIS — I519 Heart disease, unspecified: Secondary | ICD-10-CM

## 2019-02-24 MED ORDER — APIXABAN 2.5 MG PO TABS: 2.5000 mg | ORAL_TABLET | Freq: Two times a day (BID) | ORAL | 11 refills | Status: DC

## 2019-02-24 NOTE — Telephone Encounter (Signed)
Called and spoke with patient and he is still taking Crestor 10 mg once daily which was prescribed by his PCP. Updated Epic medication list to reflect this and will make provider aware he is on this. Pt was appreciative for the call to follow up and had no further questions.

## 2019-02-24 NOTE — Patient Instructions (Addendum)
Medication Instructions:  We will change eliquis to the 2.5 twice a day  If you need a refill on your cardiac medications before your next appointment, please call your pharmacy.    Lab work: No new labs needed   If you have labs (blood work) drawn today and your tests are completely normal, you will receive your results only by: Marland Kitchen MyChart Message (if you have MyChart) OR . A paper copy in the mail If you have any lab test that is abnormal or we need to change your treatment, we will call you to review the results.   Testing/Procedures: No new testing needed   Follow-Up: At [facility], you and your health needs are our priority.  As part of our continuing mission to provide you with exceptional heart care, we have created designated Provider Care Teams.  These Care Teams include your primary Cardiologist (physician) and Advanced Practice Providers (APPs -  Physician Assistants and Nurse Practitioners) who all work together to provide you with the care you need, when you need it.  . You will need a follow up appointment in 12 months   . Providers on your designated Care Team:   . Murray Hodgkins, NP . Christell Faith, PA-C . Marrianne Mood, PA-C  Any Other Special Instructions Will Be Listed Below (If Applicable).  For educational health videos Log in to : [email or web address] Or : [email or web address], password : [email or web address]  COVID-19 Vaccine Information can be found at: ShippingScam.co.uk For questions related to vaccine distribution or appointments, please email [email or web address] or call [phone].

## 2019-02-24 NOTE — Telephone Encounter (Signed)
-----   Message from Minna Merritts, MD sent at 02/24/2019  8:35 AM EST ----- Seen in clinic today Crestor fallen off his list Can we call him to find out did he stop it or just needs a refill Thx TG

## 2019-03-02 ENCOUNTER — Other Ambulatory Visit: Payer: Self-pay | Admitting: Physician Assistant

## 2019-03-16 ENCOUNTER — Other Ambulatory Visit: Payer: Self-pay

## 2019-03-16 MED ORDER — DOXAZOSIN MESYLATE 8 MG PO TABS: 8.0000 mg | ORAL_TABLET | Freq: Every day | ORAL | 5 refills | Status: DC

## 2019-03-17 DIAGNOSIS — L820 Inflamed seborrheic keratosis: Secondary | ICD-10-CM | POA: Diagnosis not present

## 2019-03-17 DIAGNOSIS — L821 Other seborrheic keratosis: Secondary | ICD-10-CM | POA: Diagnosis not present

## 2019-03-17 DIAGNOSIS — Z85828 Personal history of other malignant neoplasm of skin: Secondary | ICD-10-CM | POA: Diagnosis not present

## 2019-03-17 DIAGNOSIS — L578 Other skin changes due to chronic exposure to nonionizing radiation: Secondary | ICD-10-CM | POA: Diagnosis not present

## 2019-04-23 ENCOUNTER — Other Ambulatory Visit: Payer: Self-pay | Admitting: Family Medicine

## 2019-04-23 NOTE — Telephone Encounter (Signed)
Requested Prescriptions  Pending Prescriptions Disp Refills  . pioglitazone (ACTOS) 30 MG tablet [Pharmacy Med Name: PIOGLITAZONE HCL 30 MG TAB] 90 tablet 0    Sig: TAKE 1 TABLET BY MOUTH ONCE DAILY     Endocrinology:  Diabetes - Glitazones - pioglitazone Passed - 04/23/2019 11:41 AM      Passed - HBA1C is between 0 and 7.9 and within 180 days    Hemoglobin A1C  Date Value Ref Range Status  12/12/2018 6.8 (A) 4.0 - 5.6 % Final   HbA1c, POC (prediabetic range)  Date Value Ref Range Status  11/05/2017 7.1 (A) 5.7 - 6.4 % Final   HbA1c, POC (controlled diabetic range)  Date Value Ref Range Status  11/05/2017 7.1 (A) 0.0 - 7.0 % Final   HbA1c POC (<> result, manual entry)  Date Value Ref Range Status  11/05/2017 7.1 4.0 - 5.6 % Final         Passed - Valid encounter within last 6 months    Recent Outpatient Visits          4 months ago AKI (acute kidney injury) [facility])   [facility] [place], Kristeen Miss, PA-C   4 months ago Controlled type 2 diabetes mellitus without complication, without long-term current use of insulin [facility])   [facility] [place], Kristeen Miss, PA-C   11 months ago Type 2 diabetes mellitus with complication, without long-term current use of insulin [facility])   [place], Satira Anis, MD   1 year ago Viral upper respiratory tract infection   [place], NP   1 year ago Unilateral recurrent inguinal hernia without obstruction or gangrene   [place], NP

## 2019-05-01 ENCOUNTER — Telehealth: Payer: Self-pay | Admitting: Family Medicine

## 2019-05-01 DIAGNOSIS — E119 Type 2 diabetes mellitus without complications: Secondary | ICD-10-CM

## 2019-05-01 NOTE — Telephone Encounter (Signed)
Pt will need OV and lab prior to any more refills for monitoring of DM with glipizide - higher risk of hypoglycemia, not seen for DM since 11/2018, will need f/up visit by May

## 2019-05-01 NOTE — Telephone Encounter (Signed)
Refill request for general medication.  Last office visit:12/02/18  No follow-ups on file.  Lab Results  Component Value Date   HGBA1C 6.8 (A) 12/12/2018

## 2019-05-04 NOTE — Telephone Encounter (Signed)
LVM for scheduling

## 2019-05-20 ENCOUNTER — Ambulatory Visit: Payer: Medicare Other | Admitting: Family Medicine

## 2019-05-21 ENCOUNTER — Ambulatory Visit (INDEPENDENT_AMBULATORY_CARE_PROVIDER_SITE_OTHER): Payer: Medicare Other | Admitting: Internal Medicine

## 2019-05-21 ENCOUNTER — Other Ambulatory Visit: Payer: Self-pay

## 2019-05-21 ENCOUNTER — Encounter: Payer: Self-pay | Admitting: Internal Medicine

## 2019-05-21 VITALS — BP 122/64 | HR 71 | Temp 97.3°F | Resp 16 | Ht 68.0 in | Wt 211.0 lb

## 2019-05-21 DIAGNOSIS — D649 Anemia, unspecified: Secondary | ICD-10-CM

## 2019-05-21 DIAGNOSIS — Z952 Presence of prosthetic heart valve: Secondary | ICD-10-CM

## 2019-05-21 DIAGNOSIS — N1832 Chronic kidney disease, stage 3b: Secondary | ICD-10-CM | POA: Diagnosis not present

## 2019-05-21 DIAGNOSIS — M7989 Other specified soft tissue disorders: Secondary | ICD-10-CM

## 2019-05-21 DIAGNOSIS — E782 Mixed hyperlipidemia: Secondary | ICD-10-CM

## 2019-05-21 DIAGNOSIS — N3943 Post-void dribbling: Secondary | ICD-10-CM

## 2019-05-21 DIAGNOSIS — E6609 Other obesity due to excess calories: Secondary | ICD-10-CM

## 2019-05-21 DIAGNOSIS — E119 Type 2 diabetes mellitus without complications: Secondary | ICD-10-CM | POA: Diagnosis not present

## 2019-05-21 DIAGNOSIS — N401 Enlarged prostate with lower urinary tract symptoms: Secondary | ICD-10-CM

## 2019-05-21 DIAGNOSIS — I4891 Unspecified atrial fibrillation: Secondary | ICD-10-CM

## 2019-05-21 DIAGNOSIS — I10 Essential (primary) hypertension: Secondary | ICD-10-CM | POA: Diagnosis not present

## 2019-05-21 LAB — POCT GLYCOSYLATED HEMOGLOBIN (HGB A1C): Hemoglobin A1C: 6.4 % — AB (ref 4.0–5.6)

## 2019-05-21 MED ORDER — TAMSULOSIN HCL 0.4 MG PO CAPS: 0.4000 mg | ORAL_CAPSULE | Freq: Every day | ORAL | 1 refills | Status: DC

## 2019-05-21 NOTE — Progress Notes (Signed)
Patient ID: Timothy Riley, male    DOB: 07-Jul-1940, 79 y.o.   MRN: [id number]  PCP: Towanda Malkin, MD  Chief Complaint  Patient presents with  . Follow-up  . Aortic Stenosis  . Diabetes  . Chronic Kidney Disease  . Hyperlipidemia  . fluid retention    anklels    Subjective:   Timothy Riley is a 79 y.o. male, presents to clinic with CC of the following:  Chief Complaint  Patient presents with  . Follow-up  . Aortic Stenosis  . Diabetes  . Chronic Kidney Disease  . Hyperlipidemia  . 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 Medications include - metoprolol succinate 12.5 daily , amiodarone 200 with  lower dose Eliquis 2.5 twice daily (was reduced as he was having frequent bruising in his hands, and this continues after activities like golfing despite the reduction of his Eliquis dose)  Most recent Echo 03/2018,  1. The left ventricle has normal systolic function with an ejection fraction of 60-65%. The cavity size was normal. There is mildly increased left ventricular wall thickness. Left ventricular diastolic Doppler parameters are consistent with impaired  relaxation. 2. The right ventricle has normal systolic function. The cavity was normal. There is no increase in right ventricular wall thickness. 3. Left atrial size was mildly dilated. 4. 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 Procedure Date: 03/01/14 Normal aortic valve  prosthesis. AV Mean Grad: 16.0 mmHg 5. Right atrial pressure is estimated at 10 mmHg.   Pt denies CP, SOB, exertional sx, + LE edema that has increased in the recent past after activities like playing golf and gone when off his feet, no palpitation, increased HAs, or visual disturbances.  He was given a small dose of furosemide to use as needed for his lower extremity swelling, has not used for many months, although did use a couple times more recently.  DM type 2   Currently managing with glipizide 10 mg BID prior to meals, and actos 30 mg Pt notes good med compliance Checks BS about once every 2 weeks, 130 fasting on ave No hypoglycemic episodes Denies: polydipsia, does note frequent urination-predominantly overnight and not so much during the day, no vision changes, or neuropathy symptoms Eye exam-not within the past year, did encourage more regular eye exams with his diabetes Diabetic foot exam-done today ACEI/ARB: Yes Statin: Yes Lab Results  Component Value Date   HGBA1C 6.8 (A) 12/12/2018   CKD -  Over the past year his kidney function had decreased, questioned if related to lack of hydration. is followed by nephrology (Dr. Candiss Norse) presently, next appointment is Monday with labs included. Lab Results  Component Value Date   CREATININE 2.57 (H) 12/23/2018   Hyperlipidemia Currently on Crestor-10 mg daily  LDL goal <70 Admits not really strict with his diet recently. No myalgias. Lab Results  Component Value Date   CHOL 135 12/23/2018   HDL 28 (L) 12/23/2018  LDLCALC 79 12/23/2018   TRIG 185 (H) 12/23/2018   CHOLHDL 4.8 12/23/2018   Obesity -  Has gained some weight in the recent past. Has not really been strict with dietary modifications over this time. His wife did pass away at the end of last year, which has increased some challenges with respect to his diet. Wt Readings from Last 3 Encounters:  05/21/19 211 lb (95.7 kg)  02/24/19 209 lb 8 oz (95 kg)  01/12/19  203 lb 9.6 oz (92.4 kg)    H/o mild anemia - Denies any dark or black stools, no bleeding per rectum, no marked fatigue, Brought in stool cards to his last visit in November 2020 which were tested and were all negative. CKD likely contributing Lab Results  Component Value Date   WBC 6.4 12/12/2018   HGB 11.3 (L) 12/12/2018   HCT 34.2 (L) 12/12/2018   MCV 92.7 12/12/2018   PLT 179 12/12/2018   BPH Had seen urologist in the past, although not in the recent past. Denies increased urgency, hesitancy, up about 4-5x/night to urinate.  He keeps a jug by his bed and goes into the jug rather than getting up each time. Lab Results  Component Value Date   PSA 1.1 11/05/2017     Had a robotic inguinal hernia repair in December 2020.  Tob -  quit smoking about 47 years ago. His smoking use included cigarettes. He has a 0.50 pack-year smoking history. He has never used smokeless tobacco.  Alcohol - He reports that he does not drink alcohol  He reports that he is doing no regular exercise, although he does like to golf, and when more active his ankles tend to get more swollen in the recent past.  He did have the Covid vaccine-2 doses  Patient Active Problem List   Diagnosis Date Noted  . Senile purpura ([place]) 12/12/2018  . AKI (acute kidney injury) ([place]) 12/12/2018  . Anemia 12/12/2018  . Controlled type 2 diabetes mellitus without complication, without long-term current use of insulin ([place]) 11/05/2017  . Arthritis of both feet 11/05/2017  . H/O colonoscopy with polypectomy 08/27/2014  . S/P AVR (aortic valve replacement) 08/24/2014  . 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  . Hypertension goal BP (blood pressure) < 140/90 08/24/2014  . CKD (chronic kidney disease) stage 3, GFR 30-59 ml/min 06/17/2014  . Atrial fibrillation ([place]) 06/17/2014  . H/O paroxysmal supraventricular tachycardia 05/31/2014  . SVT (supraventricular tachycardia) ([place]) 05/29/2014  .  Arteriosclerosis of coronary artery 01/11/2014  . 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 Mixed hyperlipidemia 12/01/2013      Current Outpatient Medications:  .  amiodarone (PACERONE) 200 MG tablet, TAKE 1 TABLET BY MOUTH ONCE A DAY, Disp: 90 tablet, Rfl: 3 .  amLODipine (NORVASC) 10 MG tablet, Take 1 tablet (10 mg total) by mouth daily., Disp: 90 tablet, Rfl: 1 .  apixaban (ELIQUIS) 2.5 MG TABS tablet, Take 1 tablet (2.5 mg total) by mouth 2 (two) times daily., Disp: 60 tablet, Rfl: 11 .  doxazosin (CARDURA) 8 MG tablet, Take 1 tablet (8 mg total) by mouth at bedtime., Disp: 30 tablet, Rfl: 5 .  glipiZIDE (GLUCOTROL) 10 MG tablet, TAKE 1 TABLET BY MOUTH TWICE A DAY BEFORE A MEAL., Disp: 180 tablet, Rfl: 0 .  metoprolol succinate (TOPROL-XL) 25 MG 24 hr tablet, TAKE 1/2 (12.5 MG TOTAL) BY MOUTH DAILY, Disp: 15 tablet, Rfl: 11 .  pioglitazone (ACTOS) 30 MG tablet, TAKE  1 TABLET BY MOUTH ONCE DAILY, Disp: 90 tablet, Rfl: 0 .  rosuvastatin (CRESTOR) 10 MG tablet, Take 1 tablet (10 mg total) by mouth daily., Disp: 90 tablet, Rfl: 3 .  Turmeric Curcumin 500 MG CAPS, Take 500 mg by mouth daily., Disp: , Rfl:  .  amoxicillin (AMOXIL) 500 MG capsule, Take 4 capsules by mouth 30-60 min prior to dental procedure. (Patient not taking: Reported on 05/21/2019), Disp: 4 capsule, Rfl: 2   Allergies  Allergen Reactions  . Gabapentin     dizzy  . Hydralazine Anxiety and Rash  . Hydralazine Hcl Rash     Past Surgical History:  Procedure Laterality Date  . AORTIC VALVE REPLACEMENT    . CARDIAC CATHETERIZATION    . CARDIOVERSION N/A 01/01/2018   Procedure: CARDIOVERSION;  Surgeon: Minna Merritts, MD;  Location: [facility] ORS;  Service: Cardiovascular;  Laterality: N/A;  . COLONOSCOPY  2010  . heart valve replacment  2015  . XI ROBOTIC ASSISTED INGUINAL HERNIA REPAIR WITH MESH Right 01/07/2019   Procedure: XI ROBOTIC ASSISTED INGUINAL HERNIA REPAIR WITH MESH;  Surgeon:  Ronny Bacon, MD;  Location: [facility] ORS;  Service: General;  Laterality: Right;     Family History  Problem Relation Age of Onset  . Cancer Mother   . Stroke Father      Social History   Tobacco Use  . Smoking status: Former Smoker    Packs/day: 0.50    Years: 1.00    Pack years: 0.50    Types: Cigarettes    Quit date: 01/30/1972    Years since quitting: 47.3  . Smokeless tobacco: Never Used  . Tobacco comment: over 45 yrs ago  Substance Use Topics  . Alcohol use: No    Alcohol/week: 0.0 standard drinks    With staff assistance, above reviewed with the patient today.  ROS: As per HPI, otherwise no specific complaints on a limited and focused system review   No results found for this or any previous visit (from the past 72 hour(s)).   PHQ2/9: Depression screen [facility] 2/9 05/21/2019 12/23/2018 12/12/2018 05/01/2018 01/27/2018  Decreased Interest 0 0 0 0 0  Down, Depressed, Hopeless 1 0 1 0 0  PHQ - 2 Score 1 0 1 0 0  Altered sleeping 1 0 0 0 1  Tired, decreased energy 1 0 0 0 3  Change in appetite 0 0 0 0 0  Feeling bad or failure about yourself  0 0 0 0 0  Trouble concentrating 0 0 0 0 1  Moving slowly or fidgety/restless 0 0 0 0 0  Suicidal thoughts 0 0 0 0 0  PHQ-9 Score 3 0 1 0 5  Difficult doing work/chores Not difficult at all Not difficult at all Not difficult at all Not difficult at all Not difficult at all   PHQ-2/9 Result reviewed  Fall Risk: Fall Risk  05/21/2019 01/12/2019 01/12/2019 12/23/2018 12/15/2018  Falls in the past year? 0 0 0 0 0  Number falls in past yr: 0 - - 0 -  Injury with Fall? 0 - - 0 -  Follow up - - - - -      Objective:   Vitals:   05/21/19 1305  BP: 122/64  Pulse: 71  Resp: 16  Temp: (!) 97.3 F (36.3 C)  TempSrc: Temporal  SpO2: 96%  Weight: 211 lb (95.7 kg)  Height: 5\' 8"  (1.727 m)    Body mass index is 32.08 kg/m.  Physical Exam  NAD, masked, pleasant HEENT - [place]/AT, sclera anicteric, PERRL, EOMI, conj -  non-inj'ed, TM's and canals clear, pharynx clear Neck - supple, no adenopathy, no TM, carotids 2+ and = without bruits bilat Car - RRR with 1- 2 / 6 murmur in the upper sternal border region (he noted he was told he has that by his cardiologist), no gallop or rub heard. Pulm- RR and effort normal at rest, CTA without wheeze or rales Abd - soft, NT, obese, protuberant, BS+,  No obvious masses, no obvious HSM Back - no CVA tenderness GU - Prostate - no tenderness, fluctuance, nodules on palpation, mildly increased in size - 2+ Skin-diffuse ecchymosis present on the dorsal aspect of both hands involving the majority of area on the dorsal surface of both hands from the MCP joints down to the wrist, no ecchymosis noted elsewhere on the abdomen, chest, back, extremities, and denied otherwise.  (He noted this happens frequently after golfing activities which he did earlier today, and the cardiologist noted likely due to the thin skin in this region and the traumas from these activities) Ext - 1-2+ LE edema on the right, 1+ on the left, no calf swelling, no increased erythema or warmth of the calf, nontender, Diabetic foot exam: No skin breakdown, ulcers Adequate DP pulses Normal sensation to light touch  Monofilament testing within normal limits Neuro/psychiatric - affect was not flat, appropriate with conversation  Alert and oriented  Grossly non-focal - good strength on testing extremities, sensation intact to LT in distal extremities  Speech and gait are normal   Results for orders placed or performed during the hospital encounter of 01/07/19  Glucose, capillary  Result Value Ref Range   Glucose-Capillary 167 (H) 70 - 99 mg/dL  Glucose, capillary  Result Value Ref Range   Glucose-Capillary 164 (H) 70 - 99 mg/dL   A1C- 6.4% today   Assessment & Plan:   1. Hypertension goal BP (blood pressure) < 140/90 Blood pressure controlled, continue current medications with continued input from  cardiology  2. Atrial fibrillation, unspecified type [facility]) Continue follow-up with cardiology Is on Eliquis at a low dose, and continue to monitor for bruising, and if involving more areas than just the hands, follow-up recommended  3. Controlled type 2 diabetes mellitus without complication, without long-term current use of insulin (HCC) His A1c today was 6.4%, down from previous. We will continue his current medication regimen, and did mention with his chronic kidney disease, that might be helpful to consider change to an SGLT2 inhibitor.  Also this may help get him off of the glipizide product, which may lessen hypoglycemic risks. I did asked that he talk with his nephrologist about this possibility on his follow-up on Monday, and did put that on the AVS to take with him to help as a reminder. He will be getting lab test done on Monday as well, and will include a kidney function test as part of that. We will hold on checking more labs today. - POCT HgB A1C  4. Stage 3b chronic kidney disease Continue with nephrology input.  5. 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  We will try a Flomax product-1 daily, and assess his response. May need to increase this over time pending his status. His last PSA in 2019 was good, and may check that again on follow-up lab in the future.  - tamsulosin (FLOMAX) 0.4 MG CAPS  capsule; Take 1 capsule (0.4 mg total) by mouth daily.  Dispense: 90 capsule; Refill: 1  6. S/P AVR (aortic valve replacement) Continue with cardiology follow-up.  7. Mixed hyperlipidemia We will hold on checking his lipid panel again today, and will do on a follow-up visit. Continue with statin presently.  8. Anemia, unspecified type Discussed checking a CBC again, and noted likely can get with his labs being drawn on Monday.  Also put that in his AVS to take  with him to that visit with nephrology to potentially add on that lab draw. Continue to monitor.  9. Class 1 obesity due to excess calories with serious comorbidity in adult, unspecified BMI Discussed today concerns if his weight continues to increase, how it will affect his above medical conditions, and recommended he try to prevent further increases, and if he can lose a little again that would be helpful.  He noted he really has been not watching his diet much in the recent past, and will try to do a better job at this point.  10. Localized swelling of both lower extremities Discussed with him the potential causes, and encouraged him to go away as he gets off his feet.  Did recommend compression stockings, especially when he is more active. Also can use the furosemide product given previously as needed, and if this is increasing further, may get back to cardiology again sooner than 1 year follow-up to help reassess.  He will follow up again in approximately 4 months time, sooner as needed      Towanda Malkin, MD 05/21/19 1:17 PM

## 2019-05-21 NOTE — Patient Instructions (Signed)
See if can get a CBC with blood draw on Monday.  Ask kidney doctor thoughts on an SGLT2 inhibitor medicine addition for your DM

## 2019-05-25 DIAGNOSIS — R600 Localized edema: Secondary | ICD-10-CM | POA: Diagnosis not present

## 2019-05-25 DIAGNOSIS — R801 Persistent proteinuria, unspecified: Secondary | ICD-10-CM | POA: Diagnosis not present

## 2019-05-25 DIAGNOSIS — N1832 Chronic kidney disease, stage 3b: Secondary | ICD-10-CM | POA: Diagnosis not present

## 2019-05-25 DIAGNOSIS — I10 Essential (primary) hypertension: Secondary | ICD-10-CM | POA: Diagnosis not present

## 2019-05-27 ENCOUNTER — Telehealth: Payer: Self-pay | Admitting: Internal Medicine

## 2019-05-27 NOTE — Telephone Encounter (Signed)
Left message for patient to schedule Annual Wellness Visit.  Please schedule with Nurse Health Advisor Victoria Britt, RN at [place]  

## 2019-06-05 ENCOUNTER — Telehealth: Payer: Self-pay | Admitting: Internal Medicine

## 2019-06-05 NOTE — Telephone Encounter (Signed)
Left message for patient to schedule Annual Wellness Visit.  Please schedule with Nurse Health Advisor KASEY UTHUS, RN.   

## 2019-06-13 ENCOUNTER — Other Ambulatory Visit: Payer: Self-pay | Admitting: Family Medicine

## 2019-07-01 ENCOUNTER — Other Ambulatory Visit: Payer: Self-pay | Admitting: Family Medicine

## 2019-07-01 DIAGNOSIS — I10 Essential (primary) hypertension: Secondary | ICD-10-CM

## 2019-07-02 ENCOUNTER — Other Ambulatory Visit: Payer: Self-pay

## 2019-07-08 ENCOUNTER — Ambulatory Visit: Payer: Medicare Other | Admitting: Internal Medicine

## 2019-07-10 ENCOUNTER — Ambulatory Visit: Payer: Medicare Other | Admitting: Internal Medicine

## 2019-07-13 NOTE — Progress Notes (Signed)
Patient ID: Timothy Riley, male    DOB: 1940-12-19, 79 y.o.   MRN: [id number]  PCP: Towanda Malkin, MD  Chief Complaint  Patient presents with  . Fatigue    even after sleeping all night and all day he will still wake up tired  . Hypertension    readings have been normal and he wants to compare those readings    Subjective:   Timothy Riley is a 79 y.o. male, presents to clinic with CC of the following:  Chief Complaint  Patient presents with  . Fatigue    even after sleeping all night and all day he will still wake up tired  . Hypertension    readings have been normal and he wants to compare those readings    HPI:  Patient is an 79 year old male who I first met 9/37/3428 He has a complicated past medical history, with a significant past cardiac history noted He follows up today with his main concern mind to make sure his blood pressure is controlled appropriately, as he noted a couple readings in the past few days being in the 110-120 range of the top number over the 60s, and a week and a half ago he stopped his metoprolol as he had been weaning it down to taking only a quarter of it in the past few months.  He noted he has an appointment with cardiology coming up in the next month, although he does not know the date.  He also noted he stopped the tamsulosin product as he had "no semen ", interpreted as having nothing come out with ejaculation.  He then stopped the tamsulosin product, and it returned to normal again.  He has remained off of this now.  He also has a spot on his foot he wanted me to look at which is not painful, not itchy, not bothersome, just he noticed it on the top of his foot at the base of his big toe, and given his diabetic history, he wanted to get it checked.  I then noted to him that I thought his main complaint was fatigue, and he notes he is doing very well for a person his age.  He states he just power washed his house 2 weeks ago by  himself, has put up shrubbery, and it hurt him to hire someone to get up on the ladders to clean out his gutters.  He did note he has days where he feels quite tired, but then noted other days he is full of energy.  He denied any recent infectious symptoms, also no increased bleeding more than the easy bruising which is not new, with no black or dark stools, no bleeding per rectum.     Issues followed by cardiology include (last visit with Dr. 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 The left ventricle has normal systolic function with an ejection fraction of 60-65%. The cavity size was normal. There is mildly increased left ventricular wall thickness. Left ventricular diastolic Doppler parameters are consistent with impaired  relaxation. 2. The right ventricle has  normal systolic function. The cavity was normal. There is no increase in right ventricular wall thickness. 3. Left atrial size was mildly dilated. 4. 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 Procedure Date: 03/01/14 Normal aortic valve prosthesis. AV Mean Grad: 16.0 mmHg 5. Right atrial pressure is estimated at 10 mmHg.   Pt denies CP, SOB, exertional sx, + LE edema that has not increased in the recent past, no palpitation, increased HAs, or visual disturbances.  He was given a small dose of furosemide to use as needed for his lower extremity swelling previously.  DM type 2 hx Last A1c was improved at 6.4 Currently managing with glipizide 10 mg BID prior  to meals, and actos 30 mg Pt notes good med compliance Checks BS about twice week, 130-140 fasting on ave No hypoglycemic episodes Denies: polydipsia, does note frequent urination at night often, no vision changes (has not seen the eye doctor in over a year), or neuropathy symptoms ACEI/ARB: Not on presently due to fluctuating renal function Statin: Yes Lab Results  Component Value Date   HGBA1C 6.4 (A) 05/21/2019   HGBA1C 6.8 (A) 12/12/2018   HGBA1C 7.1 (A) 11/05/2017   HGBA1C 7.1 11/05/2017   HGBA1C 7.1 (A) 11/05/2017   HGBA1C 7.1 (A) 11/05/2017   Lab Results  Component Value Date   MICROALBUR 8.0 12/23/2018   LDLCALC 79 12/23/2018   CREATININE 2.57 (H) 12/23/2018    CKD -  Over the past year his kidney function had decreased,  with that improved on most recent check by nephrology in April. is followed by nephrology (Dr. Candiss Norse) presently, last saw 05/25/19 Lab Results  Component Value Date   CREATININE 2.57 (H) 12/23/2018   BUN 44 (H) 12/23/2018   NA 141 12/23/2018   K 3.8 12/23/2018   CL 103 12/23/2018   CO2 30 12/23/2018  Labs from Nephrology visit 04/2019 BUN - 37 Cr - 2.37 GFR - 25 Is to return in 4 months with nephrology  Hyperlipidemia Currently on Crestor-10 mg daily  LDL goal <70 No myalgias.      Lab Results  Component Value Date   CHOL 135 12/23/2018   HDL 28 (L) 12/23/2018   LDLCALC 79 12/23/2018   TRIG 185 (H) 12/23/2018   CHOLHDL 4.8 12/23/2018   Obesity -  Had gained some weight in the recent past, and more recently has lost weight intentionally. Has tried to do a better job watching his diet consumption. His wife did pass away at the end of last year, which has increased some challenges with respect to his diet. Wt Readings from Last 3 Encounters:  07/14/19 206 lb 14.4 oz (93.8 kg)  05/21/19 211 lb (95.7 kg)  02/24/19 209 lb 8 oz (95 kg)    H/o mild anemia - Denies any dark or black stools, no bleeding per rectum, has fatigue on  some days more than others. Brought in stool cards to his  visit in November 2020 which were tested and were all negative. CKD likely contributing      Lab Results  Component Value Date   WBC 6.4 12/12/2018   HGB 11.3 (L) 12/12/2018   HCT 34.2 (L) 12/12/2018   MCV 92.7 12/12/2018   PLT 179 12/12/2018   BPH Had seen urologist in the past, although not in the recent past. Denies increased urgency, hesitancy, is up about 4-5x/night to urinate.  He keeps a jug by his bed and goes into the jug rather than getting up  each time. Added flomax last visit and not tolerated as noted above, and stopped the Flomax. Last PSA check was okay.      Lab Results  Component Value Date   PSA 1.1 11/05/2017    Tob -  quit smoking about 47 years ago. His smoking use included cigarettes. He has a 0.50 pack-year smoking history. He has never used smokeless tobacco.  Alcohol - He reports that he does not drink alcohol  He reports that he is doing no regular exercise,  although is very active still.  He did have the Covid vaccine-2 doses   Patient Active Problem List   Diagnosis Date Noted  . Localized swelling of both lower extremities 05/21/2019  . Class 1 obesity due to excess calories with serious comorbidity in adult 05/21/2019  . Senile purpura ([place]) 12/12/2018  . AKI (acute kidney injury) ([place]) 12/12/2018  . Anemia 12/12/2018  . Controlled type 2 diabetes mellitus without complication, without long-term current use of insulin ([place]) 11/05/2017  . Arthritis of both feet 11/05/2017  . H/O colonoscopy with polypectomy 08/27/2014  . S/P AVR (aortic valve replacement) 08/24/2014  . 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  . Hypertension goal BP (blood pressure) < 140/90 08/24/2014  . CKD (chronic kidney disease) stage 3, GFR 30-59 ml/min 06/17/2014  . Atrial fibrillation ([place]) 06/17/2014  . H/O paroxysmal supraventricular tachycardia 05/31/2014  . SVT (supraventricular  tachycardia) ([place]) 05/29/2014  . Arteriosclerosis of coronary artery 01/11/2014  . 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 Mixed hyperlipidemia 12/01/2013      Current Outpatient Medications:  .  amiodarone (PACERONE) 200 MG tablet, TAKE 1 TABLET BY MOUTH ONCE A DAY, Disp: 90 tablet, Rfl: 3 .  amLODipine (NORVASC) 10 MG tablet, TAKE 1 TABLET BY MOUTH ONCE A DAY, Disp: 90 tablet, Rfl: 3 .  apixaban (ELIQUIS) 2.5 MG TABS tablet, Take 1 tablet (2.5 mg total) by mouth 2 (two) times daily., Disp: 60 tablet, Rfl: 11 .  doxazosin (CARDURA) 8 MG tablet, Take 1 tablet (8 mg total) by mouth at bedtime., Disp: 30 tablet, Rfl: 5 .  glipiZIDE (GLUCOTROL) 10 MG tablet, TAKE 1 TABLET BY MOUTH TWICE A DAY BEFORE A MEAL., Disp: 180 tablet, Rfl: 0 .  rosuvastatin (CRESTOR) 10 MG tablet, TAKE 1 TABLET BY MOUTH ONCE DAILY, Disp: 90 tablet, Rfl: 3 .  Turmeric Curcumin 500 MG CAPS, Take 500 mg by mouth daily., Disp: , Rfl:  .  amoxicillin (AMOXIL) 500 MG capsule, Take 4 capsules by mouth 30-60 min prior to dental procedure. (Patient not taking: Reported on 05/21/2019), Disp: 4 capsule, Rfl: 2 .  metoprolol succinate (TOPROL-XL) 25 MG 24 hr tablet, TAKE 1/2 (12.5 MG TOTAL) BY MOUTH DAILY (Patient not taking: Reported on 07/14/2019), Disp: 15 tablet, Rfl: 11 .  pioglitazone (ACTOS) 30 MG tablet, TAKE 1 TABLET BY MOUTH ONCE DAILY (Patient not taking: Reported on 07/14/2019), Disp: 90 tablet, Rfl: 0 .  tamsulosin (FLOMAX) 0.4 MG CAPS capsule, Take 1 capsule (0.4 mg total) by mouth daily. (Patient not taking: Reported on 07/14/2019), Disp: 90 capsule, Rfl: 1   Allergies  Allergen Reactions  . Gabapentin     dizzy  . Hydralazine Anxiety and Rash  . Hydralazine Hcl Rash     Past Surgical History:  Procedure Laterality Date  . AORTIC VALVE REPLACEMENT    . CARDIAC CATHETERIZATION    . CARDIOVERSION N/A 01/01/2018   Procedure: CARDIOVERSION;  Surgeon: Minna Merritts, MD;  Location: [facility] ORS;  Service: Cardiovascular;  Laterality: N/A;  . COLONOSCOPY  2010  . heart valve replacment  2015  . XI ROBOTIC ASSISTED INGUINAL HERNIA REPAIR WITH MESH Right 01/07/2019   Procedure: XI ROBOTIC ASSISTED INGUINAL HERNIA REPAIR WITH MESH;  Surgeon: Ronny Bacon, MD;  Location: [facility] ORS;  Service: General;  Laterality: Right;     Family History  Problem Relation Age of Onset  . Cancer Mother   . Stroke Father      Social History   Tobacco Use  . Smoking status: Former Smoker    Packs/day: 0.50    Years: 1.00    Pack years: 0.50    Types: Cigarettes    Quit date: 01/30/1972    Years since quitting: 47.4  . Smokeless tobacco: Never Used  . Tobacco comment: over 45 yrs ago  Substance Use Topics  . Alcohol use: No    Alcohol/week: 0.0 standard drinks    With staff assistance, above reviewed with the patient today.  ROS: As per HPI, otherwise no specific complaints on a limited and focused system review   No results found for this or any previous visit (from the past 72 hour(s)).   PHQ2/9: Depression screen [facility] 2/9 07/14/2019 05/21/2019 12/23/2018 12/12/2018 05/01/2018  Decreased Interest 0 0 0 0 0  Down, Depressed, Hopeless 0 1 0 1 0  PHQ - 2 Score 0 1 0 1 0  Altered sleeping 2 1 0 0 0  Tired, decreased energy - 1 0 0 0  Change in appetite 0 0 0 0 0  Feeling bad or failure about yourself  0 0 0 0 0  Trouble concentrating 0 0 0 0 0  Moving slowly or fidgety/restless 0 0 0 0 0  Suicidal thoughts 0 0 0 0 0  PHQ-9 Score 2 3 0 1 0  Difficult doing work/chores Somewhat difficult Not difficult at all Not difficult at all Not difficult at all Not difficult at all  Some recent data might be hidden   PHQ-2/9 Result reviewed  Fall Risk: Fall Risk  07/14/2019 05/21/2019 01/12/2019 01/12/2019 12/23/2018  Falls in the past year? 0 0 0 0 0  Number falls in past yr: 0 0 - - 0  Injury with Fall? 0 0 - - 0  Follow up - - - - -      Objective:   Vitals:    07/14/19 1405  Pulse: 85  Resp: 16  Temp: 98.1 F (36.7 C)  TempSrc: Temporal  SpO2: 99%  Weight: 206 lb 14.4 oz (93.8 kg)  Height: '5\' 8"'  (1.727 m)    Body mass index is 31.46 kg/m.  Physical Exam   NAD, masked HEENT - [place]/AT, sclera anicteric, PERRL, EOMI, positive glasses, conj - non-inj'ed, pharynx clear Neck - supple, no adenopathy, no TM, carotids 2+ and = without bruits bilat Car - RRR with the 1-2/6 murmur in the upper sternal border, no gallop or rub Pulm- RR and effort normal at rest, CTA without wheeze or rales Abd - soft, NT, obese, protuberant,  Back - no CVA tenderness Skin-diffuse ecchymosis present on the dorsal aspect of both hands involving the majority of area on the dorsal surface of both hands from the MCP joints down to the wrist not new and notes he has had that for months),  scattered areas of ecchymosis noted on the forearms, he notes from doing the outside work around the house recently.  Ext - 1+ LE edema, slightly worse on  the right, a small erythematous abrasion was noted at the base of the left great toe and between the first and second toe which was nontender, no blistering, no open skin component, and no other lesions on the foot noted.  No concerns inspecting the bottom of the foot. Neuro/psychiatric - affect was not flat, appropriate with conversation  Alert and oriented  Grossly non-focal   Speech normal   Results for orders placed or performed in visit on 05/21/19  POCT HgB A1C  Result Value Ref Range   Hemoglobin A1C 6.4 (A) 4.0 - 5.6 %   HbA1c POC (<> result, manual entry)     HbA1c, POC (prediabetic range)     HbA1c, POC (controlled diabetic range)         Assessment & Plan:   1. Fatigue, unspecified type He noted several issues before I inquired about this which was noted in the chief complaint, as when I entered the room and asked how he was feeling, he noted not bad for an 79 year old, and that he had 2 weeks ago power washed his  entire house, put up [place] outside, and hated to have to hire someone to get up on the ladders to clean the gutters and emphasized to him that was the right thing to do. He notes there are days where he feels more tired than others, but he is remaining very active. Discussed some next steps that we should do if he is more fatigued, including checking some lab tests as a start, and he noted he had several tubes taken when went to the nephrologist just this past April, and wanted to wait before having more blood drawn, and he is concerned the cardiologist on a follow-up in the next month may want to do blood tests as well. With his medical history, reviewed many concerns that can increase fatigue, such as poor control of blood sugars, worsening kidney function, cardiac issues, increasing anemia concerns to name a few. Agreed not to draw labs today, and noted he has a follow-up with me in about 2 months, and plan to get lab tests on that follow-up visit, and emphasized if he is having more fatigue over the next week or 2, following up sooner to have these done.  He noted he would do so.  2. Hypertension goal BP (blood pressure) < 140/90 His blood pressures have remained well controlled, but I do not think they are too well controlled based on the readings he is getting at home and the readings here as well. Emphasized the importance of keeping blood pressures well controlled, and continuing the small dose of the metoprolol rather than just stopping it like he did a week and a half ago as await further input from cardiology. Noted the metoprolol may be helpful more than just trying to keep his blood pressure controlled. He plan to call the cardiologist office to ask about this and a follow-up appointment. Also will continue with blood pressure checks at home to help monitor.  3. Abrasion of left foot, initial encounter Discussed the abrasion on his foot is not ulcerative, and the importance of keeping  it clean noted. Would not apply a lot topically at this point, and continue to monitor. If does worsen, and not slowly heal, needs to follow-up.    4. 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 He noted the tamsulosin had side effects as noted above, which improved after stopping. Felt best to continue off of that medicine presently.  5.  Stage 3b chronic kidney disease Continues to follow with nephrology. He inquired about possibly seeing a different nephrologist in the future, and I stated to him that was an option. Did note keeping his blood pressure well controlled and his sugars well controlled in addition to avoiding chronic NSAID use were important in the management. The last check in April did show slight improvement and continue to monitor.  6. Controlled type 2 diabetes mellitus without complication, without long-term current use of insulin (HCC) His last A1c also showed improvement, and his blood sugars at home have been running very reasonable when checked. Continue the medication regimen presently, and continue with checking his blood sugars a couple times a week felt best.  7. Anemia, unspecified type Noted his history of this, and a possible source to some intermittent fatigue symptoms. Do feel rechecking the CBC would be appropriate, and after eventually agreeing not to check labs today, will check at the latest again in a couple months time. If his symptoms of fatigue are increasing some, or if any bleeding concerns arise, will follow-up and check sooner.  8. Localized swelling of both lower extremities Has remained stable in the recent past. Continue to monitor.   Offered and wanted to check labs today if his fatigue was increasing in the recent past, and noted that to [place] today, and he wanted to hold off and keep the planned follow-up again in August.  Also to keep the follow-ups with cardiology and nephrology. Emphasized if he is more symptomatic,  the importance of following up sooner and he understood.    Towanda Malkin, MD 07/14/19 2:16 PM

## 2019-07-14 ENCOUNTER — Encounter: Payer: Self-pay | Admitting: Internal Medicine

## 2019-07-14 ENCOUNTER — Ambulatory Visit (INDEPENDENT_AMBULATORY_CARE_PROVIDER_SITE_OTHER): Payer: Medicare Other | Admitting: Internal Medicine

## 2019-07-14 ENCOUNTER — Other Ambulatory Visit: Payer: Self-pay

## 2019-07-14 VITALS — BP 138/68 | HR 85 | Temp 98.1°F | Resp 16 | Ht 68.0 in | Wt 206.9 lb

## 2019-07-14 DIAGNOSIS — S90812A Abrasion, left foot, initial encounter: Secondary | ICD-10-CM | POA: Diagnosis not present

## 2019-07-14 DIAGNOSIS — R5383 Other fatigue: Secondary | ICD-10-CM

## 2019-07-14 DIAGNOSIS — N401 Enlarged prostate with lower urinary tract symptoms: Secondary | ICD-10-CM | POA: Diagnosis not present

## 2019-07-14 DIAGNOSIS — I10 Essential (primary) hypertension: Secondary | ICD-10-CM

## 2019-07-14 DIAGNOSIS — D649 Anemia, unspecified: Secondary | ICD-10-CM

## 2019-07-14 DIAGNOSIS — M7989 Other specified soft tissue disorders: Secondary | ICD-10-CM

## 2019-07-14 DIAGNOSIS — E119 Type 2 diabetes mellitus without complications: Secondary | ICD-10-CM

## 2019-07-14 DIAGNOSIS — N184 Chronic kidney disease, stage 4 (severe): Secondary | ICD-10-CM

## 2019-07-14 DIAGNOSIS — N1832 Chronic kidney disease, stage 3b: Secondary | ICD-10-CM

## 2019-07-14 DIAGNOSIS — N3943 Post-void dribbling: Secondary | ICD-10-CM

## 2019-07-20 ENCOUNTER — Ambulatory Visit: Payer: Medicare Other | Admitting: Dermatology

## 2019-07-24 ENCOUNTER — Other Ambulatory Visit: Payer: Self-pay | Admitting: Family Medicine

## 2019-07-25 DIAGNOSIS — L03114 Cellulitis of left upper limb: Secondary | ICD-10-CM | POA: Diagnosis not present

## 2019-07-25 DIAGNOSIS — R21 Rash and other nonspecific skin eruption: Secondary | ICD-10-CM | POA: Diagnosis not present

## 2019-07-27 NOTE — Telephone Encounter (Signed)
Pt called in to check the status of this this refill.  He stated they sent it last week and again today.

## 2019-07-30 ENCOUNTER — Other Ambulatory Visit: Payer: Self-pay | Admitting: Family Medicine

## 2019-07-30 DIAGNOSIS — E119 Type 2 diabetes mellitus without complications: Secondary | ICD-10-CM

## 2019-08-15 DIAGNOSIS — T63441A Toxic effect of venom of bees, accidental (unintentional), initial encounter: Secondary | ICD-10-CM | POA: Diagnosis not present

## 2019-08-15 DIAGNOSIS — N184 Chronic kidney disease, stage 4 (severe): Secondary | ICD-10-CM | POA: Diagnosis not present

## 2019-08-15 DIAGNOSIS — E119 Type 2 diabetes mellitus without complications: Secondary | ICD-10-CM | POA: Diagnosis not present

## 2019-09-14 ENCOUNTER — Other Ambulatory Visit: Payer: Self-pay

## 2019-09-14 ENCOUNTER — Encounter: Payer: Self-pay | Admitting: Internal Medicine

## 2019-09-14 ENCOUNTER — Other Ambulatory Visit: Payer: Self-pay | Admitting: Cardiovascular Disease

## 2019-09-14 ENCOUNTER — Ambulatory Visit: Payer: Medicare Other | Admitting: Internal Medicine

## 2019-09-14 VITALS — BP 134/62 | HR 71 | Temp 98.0°F | Resp 16 | Ht 68.0 in | Wt 210.5 lb

## 2019-09-14 DIAGNOSIS — H1013 Acute atopic conjunctivitis, bilateral: Secondary | ICD-10-CM | POA: Diagnosis not present

## 2019-09-14 DIAGNOSIS — J309 Allergic rhinitis, unspecified: Secondary | ICD-10-CM | POA: Diagnosis not present

## 2019-09-14 DIAGNOSIS — H05229 Edema of unspecified orbit: Secondary | ICD-10-CM

## 2019-09-14 MED ORDER — FLUTICASONE PROPIONATE 50 MCG/ACT NA SUSP: 1.0000 | Freq: Every day | NASAL | 1 refills | Status: DC

## 2019-09-14 NOTE — Progress Notes (Signed)
Patient ID: Timothy Riley, male    DOB: 1940-04-26, 79 y.o.   MRN: [id number]  PCP: Towanda Malkin, MD  Chief Complaint  Patient presents with  . Watery eyes    Last couple of months his eyes are tearing and puffy, continuously    Subjective:   Timothy Riley is a 79 y.o. male, presents to clinic with CC of the following:  Chief Complaint  Patient presents with  . Watery eyes    Last couple of months his eyes are tearing and puffy, continuously    HPI:  Patient is an 79 year old male His last visit with me was 07/14/2019 but that note reviewed He has a planned follow-up after that visit on 09/22/2019 scheduled He presents today with an eye concern.  He notes for the last month plus, he has had increased tearing from his eyes, often some mild swelling around the eyes, described as more puffiness, and also some increased nasal drainage at times, with the mucus clear.  He denied any significant allergy history in his past, perhaps some milder symptoms.  He did recently have both a bee sting episode that prompted a visit to urgent care and then shortly after a wasp bite episode that prompted her return to the urgent care, and has been managed with steroids for that.  He is off of those presently. He denies any fevers, no discolored mucus, no cough, no sore throats, no redness of the eyes, no loss of vision or change in vision or blurry vision, he wears glasses, does not wear contacts.  Denies any vision change. He has been trying some cool compresses at times, also a couple Benadryl at bedtime, and at times he uses during the day and notes it does not make him drowsy. He has an eye doctor appointment scheduled again for September.  All in all, he notes he has not been feeling bad, just having these persistent symptoms above. He noted he turns 79 in a couple months. He also noted he has follow-ups with his kidney doctor and cardiologist planned in the very near future.    Patient Active Problem List   Diagnosis Date Noted  . Localized swelling of both lower extremities 05/21/2019  . Class 1 obesity due to excess calories with serious comorbidity in adult 05/21/2019  . Senile purpura ([place]) 12/12/2018  . AKI (acute kidney injury) ([place]) 12/12/2018  . Anemia 12/12/2018  . Controlled type 2 diabetes mellitus without complication, without long-term current use of insulin ([place]) 11/05/2017  . Arthritis of both feet 11/05/2017  . H/O colonoscopy with polypectomy 08/27/2014  . S/P AVR (aortic valve replacement) 08/24/2014  . 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  . Hypertension goal BP (blood pressure) < 140/90 08/24/2014  . CKD (chronic kidney disease) stage 3, GFR 30-59 ml/min 06/17/2014  . Atrial fibrillation ([place]) 06/17/2014  . H/O paroxysmal supraventricular tachycardia 05/31/2014  . SVT (supraventricular tachycardia) ([place]) 05/29/2014  . Arteriosclerosis of coronary artery 01/11/2014  . 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 Mixed hyperlipidemia 12/01/2013      Current Outpatient Medications:  .  amiodarone (PACERONE) 200 MG tablet, TAKE 1 TABLET BY MOUTH ONCE A DAY, Disp: 90 tablet, Rfl: 3 .  amLODipine (NORVASC) 10 MG tablet, TAKE 1 TABLET BY MOUTH ONCE A DAY, Disp: 90 tablet, Rfl: 3 .  amoxicillin (AMOXIL) 500 MG capsule, Take 4 capsules by mouth 30-60 min prior to dental procedure. (Patient not taking:  Reported on 05/21/2019), Disp: 4 capsule, Rfl: 2 .  apixaban (ELIQUIS) 2.5 MG TABS tablet, Take 1 tablet (2.5 mg total) by mouth 2 (two) times daily., Disp: 60 tablet, Rfl: 11 .  doxazosin (CARDURA) 8 MG tablet, TAKE 1 TABLET BY MOUTH EVERY NIGHT AT BEDTIME, Disp: 30 tablet, Rfl: 5 .  glipiZIDE (GLUCOTROL) 10 MG tablet, TAKE 1 TABLET BY MOUTH TWICE A DAY BEFORE A MEAL., Disp: 180 tablet, Rfl: 0 .  metoprolol succinate (TOPROL-XL) 25 MG 24 hr tablet, TAKE 1/2 (12.5 MG TOTAL) BY MOUTH DAILY (Patient not  taking: Reported on 07/14/2019), Disp: 15 tablet, Rfl: 11 .  pioglitazone (ACTOS) 30 MG tablet, TAKE 1 TABLET BY MOUTH ONCE DAILY, Disp: 90 tablet, Rfl: 0 .  rosuvastatin (CRESTOR) 10 MG tablet, TAKE 1 TABLET BY MOUTH ONCE DAILY, Disp: 90 tablet, Rfl: 3 .  tamsulosin (FLOMAX) 0.4 MG CAPS capsule, Take 1 capsule (0.4 mg total) by mouth daily. (Patient not taking: Reported on 07/14/2019), Disp: 90 capsule, Rfl: 1 .  Turmeric Curcumin 500 MG CAPS, Take 500 mg by mouth daily., Disp: , Rfl:    Allergies  Allergen Reactions  . Gabapentin     dizzy  . Hydralazine Anxiety and Rash  . Hydralazine Hcl Rash     Past Surgical History:  Procedure Laterality Date  . AORTIC VALVE REPLACEMENT    . CARDIAC CATHETERIZATION    . CARDIOVERSION N/A 01/01/2018   Procedure: CARDIOVERSION;  Surgeon: Minna Merritts, MD;  Location: [facility] ORS;  Service: Cardiovascular;  Laterality: N/A;  . COLONOSCOPY  2010  . heart valve replacment  2015  . XI ROBOTIC ASSISTED INGUINAL HERNIA REPAIR WITH MESH Right 01/07/2019   Procedure: XI ROBOTIC ASSISTED INGUINAL HERNIA REPAIR WITH MESH;  Surgeon: Ronny Bacon, MD;  Location: [facility] ORS;  Service: General;  Laterality: Right;     Family History  Problem Relation Age of Onset  . Cancer Mother   . Stroke Father      Social History   Tobacco Use  . Smoking status: Former Smoker    Packs/day: 0.50    Years: 1.00    Pack years: 0.50    Types: Cigarettes    Quit date: 01/30/1972    Years since quitting: 47.6  . Smokeless tobacco: Never Used  . Tobacco comment: over 45 yrs ago  Substance Use Topics  . Alcohol use: No    Alcohol/week: 0.0 standard drinks    With staff assistance, above reviewed with the patient  today.  ROS: As per HPI, otherwise no specific complaints on a limited and focused system review   No results found for this or any previous visit (from the past 72 hour(s)).   PHQ2/9: Depression screen [facility] 2/9 09/14/2019 07/14/2019 05/21/2019  12/23/2018 12/12/2018  Decreased Interest 0 0 0 0 0  Down, Depressed, Hopeless 0 0 1 0 1  PHQ - 2 Score 0 0 1 0 1  Altered sleeping 0 2 1 0 0  Tired, decreased energy 0 - 1 0 0  Change in appetite 0 0 0 0 0  Feeling bad or failure about yourself  0 0 0 0 0  Trouble concentrating 0 0 0 0 0  Moving slowly or fidgety/restless 0 0 0 0 0  Suicidal thoughts 0 0 0 0 0  PHQ-9 Score 0 2 3 0 1  Difficult doing work/chores Not difficult at all Somewhat difficult Not difficult at all Not difficult at all Not difficult at all  Some recent data  might be hidden   PHQ-2/9 Result reviewed   Fall Risk: Fall Risk  09/14/2019 07/14/2019 05/21/2019 01/12/2019 01/12/2019  Falls in the past year? 0 0 0 0 0  Number falls in past yr: 0 0 0 - -  Injury with Fall? 0 0 0 - -  Follow up - - - - -      Objective:   Vitals:   09/14/19 1302  BP: 134/62  Pulse: 71  Resp: 16  Temp: 98 F (36.7 C)  TempSrc: Temporal  SpO2: 94%  Weight: 210 lb 8 oz (95.5 kg)  Height: 5\' 8"  (1.727 m)    Body mass index is 32.01 kg/m.  Physical Exam   NAD, masked, very pleasant HEENT - [place]/AT, sclera anicteric, PERRL, EOMI, no pain with extraocular motion testing, no photophobia, conj - non-inj'ed, mild swelling noted periorbitally on the right greater than the left, involving the upper lid some, and below the eye more on the right than left.  No erythema, no rash or other lesions of concern, nares patent, no big swollen turbinates, minimally erythematous at best,, pharynx clear.  Nontender palpating the periorbital regions bilaterally Neck - supple, no adenopathy, no rigidity Car - RRR without m/g/r Pulm- RR and effort normal at rest, CTA without wheeze or rales Skin-no rash noted on exposed areas, denied otherwise Neuro/psychiatric - affect was not flat, appropriate with conversation  Alert with normal speech    Results for orders placed or performed in visit on 05/21/19  POCT HgB A1C  Result Value Ref Range    Hemoglobin A1C 6.4 (A) 4.0 - 5.6 %   HbA1c POC (<> result, manual entry)     HbA1c, POC (prediabetic range)     HbA1c, POC (controlled diabetic range)         Assessment & Plan:   1. Allergic rhinitis, unspecified seasonality, unspecified trigger Noted this seems most consistent with an allergic source to his rhinitis, periorbital swelling, and eye tearing.  Does not seem consistent with an infectious concern presently.  Has not had fevers, feels well with his mucus clear. Recommended a eyedrop, with a Zaditor eyedrop often helpful, and is over-the-counter.  Can apply 1 drop to each eye in the morning, and then again 8 to 12 hours later. Also recommended a Flonase nasal spray, tw daily for the first 3 days, then once daily after. Discussed a nonsedating antihistamine, although the Benadryl is not sedating for him, and will use a Benadryl product-50 mg at bedtime, and 25 mg during the day at about 6-hour intervals as needed to help. Can also continue the cool compresses as he has been doing to the eyes  - fluticasone (FLONASE) 50 MCG/ACT nasal spray; Place 1 spray into both nostrils daily. Can use twice daily for the first 3 days  Dispense: 16 g; Refill: 1  2. Allergic conjunctivitis of both eyes 3. Orbital swelling As above.  Should follow-up if symptoms not improving or more problematic despite the above, and keep his planned eye doctor follow-up also recommended in September.  Did note he has a follow-up again here at the end of the month, follow-up sooner as needed.    Towanda Malkin, MD 09/14/19 1:13 PM

## 2019-09-14 NOTE — Patient Instructions (Addendum)
Recommend using a Benadryl (diphenhydramine) product 50 mg at bedtime (2 tablets), and can take 1 tablet 6 hours apart during the daytime hours as needed.  This is available over-the-counter.  Also can use Zaditor eyedrops, which are over-the-counter eyedrops, 1 drop to each eye in the morning, and can repeat again 8 to 12 hours later.  Also recommend using a Flonase (fluticasone) nasal spray, can use 1 spray to each nostril twice a day for the first 3 days, then just use once a day in the morning.  This is available over-the-counter, although I did put a prescription through for this to your pharmacy to help.  Also can continue to use cool compresses as needed.  Keep your eye doctor appointment as scheduled in September as well.

## 2019-09-21 NOTE — Progress Notes (Signed)
Cardiology Office Note  Date:  09/22/2019   ID:  Timothy Riley, DOB [birth year], MRN [id number]  PCP:  Towanda Malkin, MD   Chief Complaint  Patient presents with  . other    12 month follow up. Meds reviewed bty the pt. verbally. "doing well."    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 at [facility] with bovine valve,  SVT episodes since his AVR, requiring ablation 06/30/2014 at [facility],  diabetes type 2 with most recent hemoglobin A1c 7.3,  BPH,  normal ejection fraction  who presents for routine follow-up of his bioprosthetic valve, hypertension , atrial fibrillation  Last seen in clinic 01/2019  Doing well, eliquis expensive, he has hit the donut hole  Does exercise [employer], has not been for months,  No arrhythmia,  Off balance once in a while No chest pain Gets tired early Sedentary, legs weak  Weight up  Continues on metoprolol succinate 12.5 daily , amiodarone 200 with now lower dose Eliquis 2.5 twice daily  Wife passed away last year,  debility, memory issues  CR 2.57, BUN 44 11/2018, down to 2.37 in 04/2019 Total chol 135, LDL 79 in 11/2018 HAB1C  6.4  EKG personally reviewed by myself on todays visit NSR with RBBB rate 55   Other past medical history reviewed Echo 03/2018,   1. The left ventricle has normal systolic function with an ejection fraction of 60-65%. The cavity size was normal. There is mildly increased left ventricular wall thickness. Left ventricular diastolic Doppler parameters are consistent with impaired  relaxation.  2. The right ventricle has normal systolic function. The cavity was normal. There is no increase in right ventricular wall thickness.  3. Left atrial size was mildly dilated.  4. 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 Procedure Date: 03/01/14 Normal aortic valve  prosthesis. AV Mean Grad: 16.0 mmHg  5. 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  PSH:    Past Surgical History:  Procedure Laterality Date  . AORTIC VALVE REPLACEMENT    . CARDIAC CATHETERIZATION    . CARDIOVERSION N/A 01/01/2018   Procedure: CARDIOVERSION;  Surgeon: Minna Merritts, MD;  Location: [facility] ORS;  Service: Cardiovascular;  Laterality: N/A;  . COLONOSCOPY  2010  . heart valve replacment  2015  . XI ROBOTIC ASSISTED INGUINAL HERNIA REPAIR WITH MESH Right 01/07/2019   Procedure: XI ROBOTIC ASSISTED INGUINAL HERNIA REPAIR WITH MESH;  Surgeon: Ronny Bacon, MD;  Location: [facility] ORS;  Service: General;  Laterality: Right;    Current Outpatient  Medications  Medication Sig Dispense Refill  . amiodarone (PACERONE) 200 MG tablet TAKE 1 TABLET BY MOUTH ONCE A DAY 90 tablet 3  . amLODipine (NORVASC) 10 MG tablet TAKE 1 TABLET BY MOUTH ONCE A DAY 90 tablet 3  . amoxicillin  (AMOXIL) 500 MG capsule Take 4 capsules by mouth 30-60 min prior to dental procedure. 4 capsule 2  . apixaban (ELIQUIS) 2.5 MG TABS tablet Take 1 tablet (2.5 mg total) by mouth 2 (two) times daily. 60 tablet 11  . doxazosin (CARDURA) 8 MG tablet TAKE 1 TABLET BY MOUTH EVERY NIGHT AT BEDTIME 30 tablet 5  . fluticasone (FLONASE) 50 MCG/ACT nasal spray Place 1 spray into both nostrils daily. Can use twice daily for the first 3 days 16 g 1  . glipiZIDE (GLUCOTROL) 10 MG tablet TAKE 1 TABLET BY MOUTH TWICE A DAY BEFORE A MEAL. 180 tablet 0  . metoprolol succinate (TOPROL-XL) 25 MG 24 hr tablet TAKE 1/2 (12.5 MG TOTAL) BY MOUTH DAILY 15 tablet 11  . pioglitazone (ACTOS) 30 MG tablet TAKE 1 TABLET BY MOUTH ONCE DAILY 90 tablet 0  . rosuvastatin (CRESTOR) 10 MG tablet TAKE 1 TABLET BY MOUTH ONCE DAILY 90 tablet 3  . Turmeric Curcumin 500 MG CAPS Take 500 mg by mouth daily.     No current facility-administered medications for this visit.     Allergies:   Gabapentin, Hydralazine, and Hydralazine hcl   Social History:  The patient  reports that he quit smoking about 47 years ago. His smoking use included cigarettes. He has a 0.50 pack-year smoking history. He has never used smokeless tobacco. He reports that he does not drink alcohol and does not use drugs.   Family History:   family history includes Cancer in his mother; Stroke in his father.    Review of Systems: Review of Systems  Constitutional: Negative.   HENT: Negative.   Respiratory: Negative.   Cardiovascular: Positive for leg swelling.  Gastrointestinal: Negative.   Musculoskeletal: Negative.   Neurological: Negative.   Psychiatric/Behavioral: Negative.   All other systems reviewed and are negative.   PHYSICAL EXAM: VS:  BP 140/70 (BP Location: Left Arm, Patient Position: Sitting, Cuff Size: Normal)   Pulse (!) 55   Ht 5\' 8"  (1.727 m)   Wt 210 lb (95.3 kg)   SpO2 97%   BMI 31.93 kg/m  , BMI Body mass index is 31.93  kg/m. Constitutional:  oriented to person, place, and time. No distress.  HENT:  Head: Grossly normal Eyes:  no discharge. No scleral icterus.  Neck: No JVD, no carotid bruits  Cardiovascular: Regular rate and rhythm, 1-2 SEM RSB appreciated Trace edema rle Pulmonary/Chest: Clear to auscultation bilaterally, no wheezes or rails Abdominal: Soft.  no distension.  no tenderness.  Musculoskeletal: Normal range of motion Neurological:  normal muscle tone. Coordination normal. No atrophy Skin: Skin warm and dry Psychiatric: normal affect, pleasant    Recent Labs: 12/12/2018: Hemoglobin 11.3; Platelets 179 12/23/2018: ALT 41; BUN 44; Creat 2.57; Potassium 3.8; Sodium 141    Lipid Panel Lab Results  Component Value Date   CHOL 135 12/23/2018   HDL 28 (L) 12/23/2018   LDLCALC 79 12/23/2018   TRIG 185 (H) 12/23/2018      Wt Readings from Last 3 Encounters:  09/22/19 210 lb (95.3 kg)  09/14/19 210 lb 8 oz (95.5 kg)  07/14/19 206 lb 14.4 oz (93.8 kg)     ASSESSMENT AND PLAN:  Atrial fibrillation, persistent normal  sinus rhythm  Prior cardioversion   low-dose amiodarone, metoprolol 12.5 daily Eliquis 5 twice daily Stable, no changes  Acute on chronic renal failure Followed by nephrology, creatinine greater than 2 Recent 2.37 Avoid NSaids  HTN Blood pressure is well controlled on today's visit. No changes made to the medications.  Leg swelling Likely secondary to venous insufficiency, minimal symptoms on the left If symptoms get worse could change the amlodipine  Diabetes: Stable, HBA1C 6.5  Arteriosclerosis of coronary artery - Plan: EKG 12-Lead Currently with no symptoms of angina. No further workup at this time. Continue current medication regimen.  S/P AVR (aortic valve replacement) - Plan: EKG 12-Lead Echocardiogram 03/2018 stable prosthetic valve  Hyperlipidemia Continue crestor 10 stable   Total encounter time more than 25 minutes  Greater than 50% was  spent in counseling and coordination of care with the patient     Orders Placed This Encounter  Procedures  . EKG 12-Lead     Signed, Esmond Plants, M.D., Ph.D. 09/22/2019  [place], [place]

## 2019-09-22 ENCOUNTER — Other Ambulatory Visit: Payer: Self-pay

## 2019-09-22 ENCOUNTER — Encounter: Payer: Self-pay | Admitting: Cardiovascular Disease

## 2019-09-22 ENCOUNTER — Ambulatory Visit: Payer: Medicare Other | Admitting: Internal Medicine

## 2019-09-22 ENCOUNTER — Ambulatory Visit: Payer: Medicare Other | Admitting: Cardiovascular Disease

## 2019-09-22 VITALS — BP 140/70 | HR 55 | Ht 68.0 in | Wt 210.0 lb

## 2019-09-22 DIAGNOSIS — I10 Essential (primary) hypertension: Secondary | ICD-10-CM

## 2019-09-22 DIAGNOSIS — E782 Mixed hyperlipidemia: Secondary | ICD-10-CM

## 2019-09-22 DIAGNOSIS — I2510 Atherosclerotic heart disease of native coronary artery without angina pectoris: Secondary | ICD-10-CM

## 2019-09-22 DIAGNOSIS — N183 Chronic kidney disease, stage 3 unspecified: Secondary | ICD-10-CM

## 2019-09-22 DIAGNOSIS — I4819 Other persistent atrial fibrillation: Secondary | ICD-10-CM

## 2019-09-22 DIAGNOSIS — Z953 Presence of xenogenic heart valve: Secondary | ICD-10-CM

## 2019-09-22 NOTE — Patient Instructions (Addendum)
Medication Instructions:  No changes  If you need a refill on your cardiac medications before your next appointment, please call your pharmacy.    Lab work: No new labs needed   If you have labs (blood work) drawn today and your tests are completely normal, you will receive your results only by: Marland Kitchen MyChart Message (if you have MyChart) OR . A paper copy in the mail If you have any lab test that is abnormal or we need to change your treatment, we will call you to review the results.   Testing/Procedures: No new testing needed   Follow-Up: At [facility], you and your health needs are our priority.  As part of our continuing mission to provide you with exceptional heart care, we have created designated Provider Care Teams.  These Care Teams include your primary Cardiologist (physician) and Advanced Practice Providers (APPs -  Physician Assistants and Nurse Practitioners) who all work together to provide you with the care you need, when you need it.  . You will need a follow up appointment in 12 months   . Providers on your designated Care Team:   . Murray Hodgkins, NP . Christell Faith, PA-C . Marrianne Mood, PA-C  Any Other Special Instructions Will Be Listed Below (If Applicable).  For educational health videos Log in to : [email or web address] Or : [email or web address], password : [email or web address]  Medication Samples have been provided to the patient.  Drug name: Eliquis Strength: 2.5 mg         Qty: 4 boxes   LOT: [id number]   Exp.Date: 4/22

## 2019-09-28 DIAGNOSIS — N184 Chronic kidney disease, stage 4 (severe): Secondary | ICD-10-CM | POA: Diagnosis not present

## 2019-09-28 DIAGNOSIS — R600 Localized edema: Secondary | ICD-10-CM | POA: Diagnosis not present

## 2019-09-28 DIAGNOSIS — R801 Persistent proteinuria, unspecified: Secondary | ICD-10-CM | POA: Diagnosis not present

## 2019-09-28 DIAGNOSIS — I10 Essential (primary) hypertension: Secondary | ICD-10-CM | POA: Diagnosis not present

## 2019-10-12 ENCOUNTER — Other Ambulatory Visit: Payer: Self-pay | Admitting: Cardiovascular Disease

## 2019-10-22 ENCOUNTER — Telehealth: Payer: Self-pay | Admitting: Cardiovascular Disease

## 2019-10-22 ENCOUNTER — Other Ambulatory Visit: Payer: Self-pay | Admitting: Internal Medicine

## 2019-10-22 NOTE — Telephone Encounter (Signed)
No prolonged driving, stop at rest stop, wear compression hose, leg elevation Avoid excessive eating out fast food restaurant salt excessive fluid Lasix prn

## 2019-10-22 NOTE — Telephone Encounter (Signed)
Spoke with patient and he states that a couple of weeks ago he drove down to [employer] for a game and since then he has had swelling to his lower legs. He has taken a couple of old furosemide from 2020 but wanted to see if this should be something concerning. Reviewed that driving for long distances, salt, and fluid intake can certainly cause these issues. Encouraged compression socks, leg elevation, fluid restriction, and arranged appointment for next month with provider. Advised I would send message to him to see if there are any further recommendations until that appointment. He verbalized understanding of our conversation, agreement with plan, and had no further questions at this this time.

## 2019-10-22 NOTE — Telephone Encounter (Signed)
Left voicemail message to call back  

## 2019-10-22 NOTE — Telephone Encounter (Signed)
Pt c/o swelling: STAT is pt has developed SOB within 24 hours  1) How much weight have you gained and in what time span? NO WEIGHT GAIN  2) If swelling, where is the swelling located? BOTH ANKLES  3) Are you currently taking a fluid pill? YES  Are you currently SOB? NO  4) Do you have a log of your daily weights (if so, list)? NO WEIGHT GAIN  5) Have you gained 3 pounds in a day or 5 pounds in a week? NO   6) Have you traveled recently? 2 WEEKS AGO TO [place], [place], BUT THIS HAS BEEN GOING ON BEFORE THAT.

## 2019-10-22 NOTE — Telephone Encounter (Signed)
Patient returning call.

## 2019-10-23 NOTE — Telephone Encounter (Signed)
Left voicemail message that I did get to discuss with Dr. Rockey Situ and if I did not hear back from him today I would call back on Monday.

## 2019-10-23 NOTE — Telephone Encounter (Signed)
Spoke with patient and reviewed provider recommendations and confirmation of follow up appointment. He verbalized understanding with no further questions at this time.

## 2019-10-26 DIAGNOSIS — E113293 Type 2 diabetes mellitus with mild nonproliferative diabetic retinopathy without macular edema, bilateral: Secondary | ICD-10-CM | POA: Diagnosis not present

## 2019-10-26 DIAGNOSIS — H02051 Trichiasis without entropian right upper eyelid: Secondary | ICD-10-CM | POA: Diagnosis not present

## 2019-10-26 DIAGNOSIS — H02054 Trichiasis without entropian left upper eyelid: Secondary | ICD-10-CM | POA: Diagnosis not present

## 2019-10-26 LAB — HM DIABETES EYE EXAM

## 2019-10-29 ENCOUNTER — Other Ambulatory Visit: Payer: Self-pay | Admitting: Internal Medicine

## 2019-10-29 DIAGNOSIS — E119 Type 2 diabetes mellitus without complications: Secondary | ICD-10-CM

## 2019-11-09 NOTE — Progress Notes (Signed)
Cardiology Office Note  Date:  11/10/2019   ID:  Timothy Riley, DOB 02/05/1940, MRN [id number]  PCP:  Towanda Malkin, MD   Chief Complaint  Patient presents with  . OTHER    C/o bilateral ankle swelling mostly at night, requesting refill on Furosemide. Medications verbally reviewed with patient.     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 at [facility] with bovine valve,  SVT episodes since his AVR, requiring ablation 06/30/2014 at [facility],  diabetes type 2 with most recent hemoglobin A1c 7.3,  BPH,  normal ejection fraction  who presents for routine follow-up of his bioprosthetic valve, hypertension , atrial fibrillation  Last seen in clinic Aug 2021 He follows up for worsening leg swelling, 6 pound weight gain High fluid intake lots of coffee, water Has prescription for Lasix but has not taken any Has little bit of abdominal fullness, denies significant shortness of breath No regular exercise program  Denies any chest pain on exertion Wife passed away last year,  debility, memory issues  Lab work reviewed with him hemoglobin A1c 6.4 In August creatinine 2.33 BUN 37 Total chol 135, LDL 79 in 11/2018  Declined EKG today  Other past medical history reviewed Echo 03/2018,   1. The left ventricle has normal systolic function with an ejection fraction of 60-65%. The cavity size was normal. There is mildly increased left ventricular wall thickness. Left ventricular diastolic Doppler parameters are consistent with impaired  relaxation.  2. The right ventricle has normal systolic function. The cavity was normal. There is no increase in right ventricular wall thickness.  3. Left atrial size was mildly dilated.  4. 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 Procedure Date: 03/01/14 Normal aortic valve prosthesis. AV Mean  Grad: 16.0 mmHg  5. 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  PSH:    Past Surgical History:  Procedure Laterality Date  . AORTIC VALVE REPLACEMENT    . CARDIAC CATHETERIZATION    . CARDIOVERSION N/A 01/01/2018   Procedure: CARDIOVERSION;  Surgeon: Minna Merritts, MD;  Location: [facility] ORS;  Service: Cardiovascular;  Laterality: N/A;  . COLONOSCOPY  2010  . heart valve replacment  2015  . XI ROBOTIC ASSISTED INGUINAL HERNIA REPAIR WITH MESH Right 01/07/2019   Procedure: XI ROBOTIC ASSISTED INGUINAL HERNIA REPAIR WITH MESH;  Surgeon: Ronny Bacon, MD;  Location: [facility] ORS;  Service: General;  Laterality: Right;    Current Outpatient Medications  Medication Sig Dispense Refill  . amiodarone (PACERONE) 200 MG tablet TAKE 1  TABLET BY MOUTH ONCE A DAY 90 tablet 3  . amLODipine (NORVASC) 10 MG tablet TAKE 1 TABLET BY MOUTH ONCE A DAY 90 tablet 3  . amoxicillin (AMOXIL) 500 MG  capsule Take 4 capsules by mouth 30-60 min prior to dental procedure. 4 capsule 2  . apixaban (ELIQUIS) 2.5 MG TABS tablet Take 1 tablet (2.5 mg total) by mouth 2 (two) times daily. 60 tablet 11  . doxazosin (CARDURA) 8 MG tablet TAKE 1 TABLET BY MOUTH EVERY NIGHT AT BEDTIME 30 tablet 5  . fluticasone (FLONASE) 50 MCG/ACT nasal spray Place 1 spray into both nostrils daily. Can use twice daily for the first 3 days 16 g 1  . glipiZIDE (GLUCOTROL) 10 MG tablet TAKE 1 TABLET BY MOUTH TWICE A DAY BEFORE A MEAL. 180 tablet 1  . metoprolol succinate (TOPROL-XL) 25 MG 24 hr tablet TAKE 1/2 (12.5 MG TOTAL) BY MOUTH DAILY 15 tablet 11  . pioglitazone (ACTOS) 30 MG tablet TAKE 1 TABLET BY MOUTH ONCE DAILY 90 tablet 1  . rosuvastatin (CRESTOR) 10 MG tablet TAKE 1 TABLET BY MOUTH ONCE DAILY 90 tablet 3  . Turmeric Curcumin 500 MG CAPS Take 500 mg by mouth daily.     No current facility-administered medications for this visit.     Allergies:   Gabapentin, Hydralazine, and Hydralazine hcl   Social History:  The patient  reports that he quit smoking about 47 years ago. His smoking use included cigarettes. He has a 0.50 pack-year smoking history. He has never used smokeless tobacco. He reports that he does not drink alcohol and does not use drugs.   Family History:   family history includes Cancer in his mother; Stroke in his father.    Review of Systems: Review of Systems  Constitutional: Negative.   HENT: Negative.   Respiratory: Negative.   Cardiovascular: Positive for leg swelling.  Gastrointestinal: Negative.   Musculoskeletal: Negative.   Neurological: Negative.   Psychiatric/Behavioral: Negative.   All other systems reviewed and are negative.   PHYSICAL EXAM: VS:  BP 130/70 (BP Location: Left Arm, Patient Position: Sitting, Cuff Size: Normal)   Pulse 64   Ht 5\' 8"  (1.727 m)   Wt 213 lb (96.6 kg)   SpO2 96%   BMI 32.39 kg/m  , BMI Body mass index is 32.39 kg/m. Constitutional:   oriented to person, place, and time. No distress.  HENT:  Head: Grossly normal Eyes:  no discharge. No scleral icterus.  Neck: No JVD, no carotid bruits  Cardiovascular: Regular rate and rhythm, 1-2 SEM RSB appreciated 1+ pitting edema bilateral lower extremities Pulmonary/Chest: Clear to auscultation bilaterally, no wheezes or rails Abdominal: Soft.  no distension.  no tenderness.  Musculoskeletal: Normal range of motion Neurological:  normal muscle tone. Coordination normal. No atrophy Skin: Skin warm and dry Psychiatric: normal affect, pleasant    Recent Labs: 12/12/2018: Hemoglobin 11.3; Platelets 179 12/23/2018: ALT 41; BUN 44; Creat 2.57; Potassium 3.8; Sodium 141    Lipid Panel Lab Results  Component Value Date   CHOL 135 12/23/2018   HDL 28 (L) 12/23/2018   LDLCALC 79 12/23/2018   TRIG 185 (H) 12/23/2018      Wt Readings from Last 3 Encounters:  11/10/19 213 lb (96.6 kg)  09/22/19 210 lb (95.3 kg)  09/14/19 210 lb 8 oz (95.5 kg)     ASSESSMENT AND PLAN:  Atrial fibrillation, persistent normal sinus rhythm on prior clinic visit Prior cardioversion   low-dose amiodarone, metoprolol 12.5 daily  Eliquis 5 twice daily No changes to his medications  Acute on chronic renal failure Followed by nephrology, creatinine greater than 2 Now with worsening lower extremity edema, he reports high fluid intake, coffee in particular Prior prescription of Lasix, he did not take any we have given him a new prescription Recommend he take Lasix two- three times per week We will make nephrology aware  HTN Blood pressure is well controlled on today's visit. No changes made to the medications.  Leg swelling Worsening, increased pitting likely from fluid retention Recommended Lasix 2-3 times per week and to moderate his fluid intake  Diabetes: Stable, HBA1C 6.5  Arteriosclerosis of coronary artery - Plan: EKG 12-Lead Currently with no symptoms of angina. No further workup at  this time. Continue current medication regimen.  S/P AVR (aortic valve replacement) - Plan: EKG 12-Lead Echocardiogram 03/2018 stable prosthetic valve  Hyperlipidemia Continue crestor 10 stable   Total encounter time more than 25 minutes  Greater than 50% was spent in counseling and coordination of care with the patient     No orders of the defined types were placed in this encounter.    Signed, Esmond Plants, M.D., Ph.D. 11/10/2019  [place], [place]

## 2019-11-10 ENCOUNTER — Ambulatory Visit: Payer: Medicare Other | Admitting: Cardiovascular Disease

## 2019-11-10 ENCOUNTER — Encounter: Payer: Self-pay | Admitting: Cardiovascular Disease

## 2019-11-10 ENCOUNTER — Other Ambulatory Visit: Payer: Self-pay

## 2019-11-10 VITALS — BP 130/70 | HR 64 | Ht 68.0 in | Wt 213.0 lb

## 2019-11-10 DIAGNOSIS — R079 Chest pain, unspecified: Secondary | ICD-10-CM

## 2019-11-10 DIAGNOSIS — I2510 Atherosclerotic heart disease of native coronary artery without angina pectoris: Secondary | ICD-10-CM | POA: Diagnosis not present

## 2019-11-10 DIAGNOSIS — N183 Chronic kidney disease, stage 3 unspecified: Secondary | ICD-10-CM | POA: Diagnosis not present

## 2019-11-10 DIAGNOSIS — I519 Heart disease, unspecified: Secondary | ICD-10-CM

## 2019-11-10 DIAGNOSIS — I10 Essential (primary) hypertension: Secondary | ICD-10-CM

## 2019-11-10 DIAGNOSIS — E782 Mixed hyperlipidemia: Secondary | ICD-10-CM

## 2019-11-10 DIAGNOSIS — I4819 Other persistent atrial fibrillation: Secondary | ICD-10-CM

## 2019-11-10 MED ORDER — FUROSEMIDE 20 MG PO TABS: 20.0000 mg | ORAL_TABLET | Freq: Every day | ORAL | 3 refills | Status: DC | PRN

## 2019-11-10 NOTE — Telephone Encounter (Signed)
This encounter was created in error - please disregard.  This encounter was created in error - please disregard.

## 2019-11-10 NOTE — Patient Instructions (Addendum)
Medication Instructions:  Lasix 20 mg daily as needed for weight gain, leg swelling Take with banana  If you need a refill on your cardiac medications before your next appointment, please call your pharmacy.    Lab work: No new labs needed   If you have labs (blood work) drawn today and your tests are completely normal, you will receive your results only by:  [place] (if you have MyChart) OR  A paper copy in the mail If you have any lab test that is abnormal or we need to change your treatment, we will call you to review the results.   Testing/Procedures: No new testing needed   Follow-Up: At [facility], you and your health needs are our priority.  As part of our continuing mission to provide you with exceptional heart care, we have created designated Provider Care Teams.  These Care Teams include your primary Cardiologist (physician) and Advanced Practice Providers (APPs -  Physician Assistants and Nurse Practitioners) who all work together to provide you with the care you need, when you need it.   You will need a follow up appointment in 6 months   Providers on your designated Care Team:    Murray Hodgkins, NP  Christell Faith, PA-C  Marrianne Mood, PA-C  Any Other Special Instructions Will Be Listed Below (If Applicable).  COVID-19 Vaccine Information can be found at: ShippingScam.co.uk For questions related to vaccine distribution or appointments, please email [email or web address] or call [phone].

## 2020-01-08 ENCOUNTER — Telehealth: Payer: Self-pay | Admitting: Internal Medicine

## 2020-01-08 NOTE — Telephone Encounter (Signed)
Also of note, the patient cursed at the agent.

## 2020-01-08 NOTE — Telephone Encounter (Signed)
Patient is calling because he received a letter from Dr. Roxan Hockey that he will be leave the practice. The patient would like to know who his new doctor is so that he can report this to his new insurance company. Please advise CB- [phone]

## 2020-01-11 NOTE — Telephone Encounter (Signed)
lvm to inform pt that as of right now we are currently seeking another provider. As of right now, he may schedule one last appt with Dr Roxan Hockey for sometime in January to get any refills etc that he may need.

## 2020-01-20 ENCOUNTER — Other Ambulatory Visit: Payer: Self-pay | Admitting: Internal Medicine

## 2020-01-28 ENCOUNTER — Other Ambulatory Visit: Payer: Self-pay | Admitting: Internal Medicine

## 2020-01-28 DIAGNOSIS — E119 Type 2 diabetes mellitus without complications: Secondary | ICD-10-CM

## 2020-03-24 ENCOUNTER — Other Ambulatory Visit: Payer: Self-pay | Admitting: Cardiovascular Disease

## 2020-03-24 NOTE — Telephone Encounter (Signed)
53m, 96.6kg, 2.33 09/28/19, lovw/gollan 11/10/19.

## 2020-03-24 NOTE — Telephone Encounter (Signed)
Refill Request.  

## 2020-04-28 ENCOUNTER — Other Ambulatory Visit: Payer: Self-pay | Admitting: Cardiovascular Disease

## 2020-04-28 NOTE — Telephone Encounter (Signed)
Rx request sent to pharmacy.  

## 2020-05-05 ENCOUNTER — Telehealth: Payer: Self-pay | Admitting: Cardiovascular Disease

## 2020-05-05 NOTE — Telephone Encounter (Signed)
Rx request sent to pharmacy.  

## 2020-05-10 NOTE — Progress Notes (Signed)
Cardiology Office Note  Date:  05/11/2020   ID:  Timothy Riley, DOB Nov 04, 1940, MRN [id number]  PCP:  Towanda Malkin, MD   Chief Complaint  Patient presents with  . 6 month follow up     "doing well." Medications reviewed by the patient verbally.     HPI:  Mr. 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 at [facility] with bovine valve,  SVT episodes since his AVR, requiring ablation 06/30/2014 at [facility],  diabetes type 2  BPH,  normal ejection fraction  who presents for routine follow-up of his bioprosthetic valve, hypertension , atrial fibrillation  Last seen in clinic Oct 2021 In follow-up today reports that he stopped metoprolol, " BP was fine"  Prior lab work reviewed with him A1C 6.4  He is troubled by his chronic leg swelling Has 1+ edema b/l , left more 2+, right 1 + Taking Lasix 1-2 times per week, moderated dose secondary to GFR 25  132/60 at home  Concerned about physician turn-over at [place] office Changing to [facility] PMD  Active, no exercise Eating more bread, weight trending upwards  CR 2.33, GFR 25, stable Declining follow up with nephrology  Wife passed away last year,  debility, memory issues Recent bout on his son, pain scale active  EKG personally reviewed by myself on todays visit NSR rate 65 bpm RBBB  Other past medical history reviewed Echo 03/2018,   1. The left ventricle has normal systolic function with an ejection fraction of 60-65%. The cavity size was normal. There is mildly increased left ventricular wall thickness. Left ventricular diastolic Doppler parameters are consistent with impaired  relaxation.  2. The right ventricle has normal systolic function. The cavity was normal. There is no increase in right ventricular wall thickness.  3. Left atrial size was mildly dilated.  4. A bovine bioprosthesis valve is  present in the aortic position. Procedure Date: 03/01/14 Normal aortic valve prosthesis. AV Mean Grad: 16.0 mmHg  5. 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  PSH:    Past Surgical History:  Procedure Laterality Date  . AORTIC VALVE REPLACEMENT    . CARDIAC CATHETERIZATION    . CARDIOVERSION N/A 01/01/2018   Procedure: CARDIOVERSION;  Surgeon: Minna Merritts, MD;  Location: [facility] ORS;  Service: Cardiovascular;  Laterality: N/A;  . COLONOSCOPY  2010  . heart valve replacment  2015  . XI ROBOTIC ASSISTED INGUINAL HERNIA REPAIR WITH MESH Right 01/07/2019   Procedure: XI ROBOTIC ASSISTED INGUINAL HERNIA REPAIR WITH MESH;  Surgeon: Ronny Bacon, MD;  Location:  [facility] ORS;  Service: General;  Laterality: Right;    Current Outpatient Medications  Medication Sig Dispense Refill  . amiodarone (PACERONE) 200 MG tablet TAKE 1 TABLET BY MOUTH ONCE A DAY 90 tablet 3  . amLODipine  (NORVASC) 10 MG tablet TAKE 1 TABLET BY MOUTH ONCE A DAY 90 tablet 3  . amoxicillin (AMOXIL) 500 MG capsule TAKE 4 CAPSULES 1 HOUR BEFORE DENTAL APPOINTMENT 4 capsule 2  . doxazosin (CARDURA) 8 MG tablet TAKE 1 TABLET BY MOUTH ONCE DAILY AT BEDTIME 30 tablet 6  . ELIQUIS 2.5 MG TABS tablet TAKE 1 TABLET BY MOUTH TWICE A DAY 180 tablet 1  . fluticasone (FLONASE) 50 MCG/ACT nasal spray Place 1 spray into both nostrils daily. Can use twice daily for the first 3 days 16 g 1  . furosemide (LASIX) 20 MG tablet Take 1 tablet (20 mg total) by mouth daily as needed for edema. 90 tablet 3  . glipiZIDE (GLUCOTROL) 10 MG tablet TAKE 1 TABLET BY MOUTH TWICE A DAY BEFORE A MEAL. 180 tablet 1  . pioglitazone (ACTOS) 30 MG tablet TAKE 1 TABLET BY MOUTH ONCE DAILY 90 tablet 1  . rosuvastatin (CRESTOR) 10 MG tablet TAKE 1 TABLET BY MOUTH ONCE DAILY 90 tablet 3   No current facility-administered medications for this visit.    Allergies:   Gabapentin, Hydralazine, and Hydralazine hcl   Social History:  The patient  reports that he quit smoking about 48 years ago. His smoking use included cigarettes. He has a 0.50 pack-year smoking history. He has never used smokeless tobacco. He reports that he does not drink alcohol and does not use drugs.   Family History:   family history includes Cancer in his mother; Stroke in his father.   Review of Systems: Review of Systems  Constitutional: Negative.   HENT: Negative.   Respiratory: Negative.   Cardiovascular: Positive for leg swelling.  Gastrointestinal: Negative.   Musculoskeletal: Negative.   Neurological: Negative.   Psychiatric/Behavioral: Negative.   All other systems reviewed and are negative.   PHYSICAL EXAM: VS:  BP (!) 154/58 (BP Location: Left Arm, Patient Position: Sitting, Cuff Size: Normal)   Pulse 65   Ht 5\' 8"  (1.727 m)   Wt 218 lb 4 oz (99 kg)   SpO2 98%   BMI 33.18 kg/m  , BMI Body mass index is 33.18 kg/m. Constitutional:  oriented to  person, place, and time. No distress.  HENT:  Head: Grossly normal Eyes:  no discharge. No scleral icterus.  Neck: No JVD, no carotid bruits  Cardiovascular: Regular rate and rhythm, 1-2 SEM RSB appreciated 1+ pitting edema bilateral lower extremities Pulmonary/Chest: Clear to auscultation bilaterally, no wheezes or rails Abdominal: Soft.  no distension.  no tenderness.  Musculoskeletal: Normal range of motion Neurological:  normal muscle tone. Coordination normal. No atrophy Skin: Skin warm and dry Psychiatric: normal affect, pleasant   Recent Labs: No results found for requested labs within last 8760 hours.    Lipid Panel Lab Results  Component Value Date   CHOL 135 12/23/2018   HDL 28 (L) 12/23/2018   LDLCALC 79 12/23/2018   TRIG 185 (H) 12/23/2018    Wt Readings from Last 3 Encounters:  05/11/20 218 lb 4 oz (99 kg)  11/10/19 213 lb (96.6 kg)  09/22/19 210 lb (95.3 kg)     ASSESSMENT AND PLAN:  Atrial fibrillation, persistent Maintaining normal sinus rhythm Prior cardioversion   low-dose amiodarone, Eliquis 5 twice daily Held the metoprolol  on his own, felt it was only for blood pressure  Acute on chronic renal failure Previously followed by nephrology, creatinine greater than 2 GFR 25, stable Continues to take Lasix 1-2 times per week depending with leg swelling Encouraged him to have lab work done through primary care every 6 months  Leg swelling Likely exacerbated by amlodipine Recommended he stop the amlodipine Compression hose  HTN Hold amlodipine, likely contributing to leg swelling  increase Cardura up to 8 twice daily Monitor blood pressure at home, for near syncope recommended to call our office  Diabetes: Stable, HBA1C 6.5 Will have repeat lab work with primary care  Arteriosclerosis of coronary artery - Plan: EKG 12-Lead Currently with no symptoms of angina. No further workup at this time. Continue current medication regimen. Cholesterol  close to goal  S/P AVR (aortic valve replacement) - Plan: EKG 12-Lead Echocardiogram 03/2018 stable prosthetic valve Repeat echocardiogram 6 months  Hyperlipidemia Continue crestor 10 stable   Total encounter time more than 25 minutes  Greater than 50% was spent in counseling and coordination of care with the patient     Orders Placed This Encounter  Procedures  . EKG 12-Lead     Signed, Esmond Plants, M.D., Ph.D. 05/11/2020  [place], [place]

## 2020-05-11 ENCOUNTER — Ambulatory Visit: Payer: Medicare Other | Admitting: Cardiovascular Disease

## 2020-05-11 ENCOUNTER — Other Ambulatory Visit: Payer: Self-pay

## 2020-05-11 ENCOUNTER — Encounter: Payer: Self-pay | Admitting: Cardiovascular Disease

## 2020-05-11 VITALS — BP 154/58 | HR 65 | Ht 68.0 in | Wt 218.2 lb

## 2020-05-11 DIAGNOSIS — R079 Chest pain, unspecified: Secondary | ICD-10-CM

## 2020-05-11 DIAGNOSIS — N183 Chronic kidney disease, stage 3 unspecified: Secondary | ICD-10-CM | POA: Diagnosis not present

## 2020-05-11 DIAGNOSIS — I10 Essential (primary) hypertension: Secondary | ICD-10-CM

## 2020-05-11 DIAGNOSIS — Z953 Presence of xenogenic heart valve: Secondary | ICD-10-CM

## 2020-05-11 DIAGNOSIS — I519 Heart disease, unspecified: Secondary | ICD-10-CM

## 2020-05-11 DIAGNOSIS — I4819 Other persistent atrial fibrillation: Secondary | ICD-10-CM

## 2020-05-11 DIAGNOSIS — I2510 Atherosclerotic heart disease of native coronary artery without angina pectoris: Secondary | ICD-10-CM | POA: Diagnosis not present

## 2020-05-11 DIAGNOSIS — E782 Mixed hyperlipidemia: Secondary | ICD-10-CM

## 2020-05-11 MED ORDER — DOXAZOSIN MESYLATE 8 MG PO TABS: 8.0000 mg | ORAL_TABLET | Freq: Every day | ORAL | 6 refills | Status: DC

## 2020-05-11 NOTE — Patient Instructions (Addendum)
Medication Instructions:  STOP the amlodipine Increase the doxazosin up to 8 mg twice a day  If you need a refill on your cardiac medications before your next appointment, please call your pharmacy.    Lab work: No new labs needed  Testing/Procedures: Echo in 6 months for prosthetic aortic valve Your physician has requested that you have an echocardiogram. Echocardiography is a painless test that uses sound waves to create images of your heart. It provides your doctor with information about the size and shape of your heart and how well your heart's chambers and valves are working. This procedure takes approximately one hour. There are no restrictions for this procedure.  There is a possibility that an IV may need to be started during your test to inject an image enhancing agent. This is done to obtain more optimal pictures of your heart. Therefore we ask that you do at least drink some water prior to coming in to hydrate your veins.     Follow-Up: At [facility], you and your health needs are our priority.  As part of our continuing mission to provide you with exceptional heart care, we have created designated Provider Care Teams.  These Care Teams include your primary Cardiologist (physician) and Advanced Practice Providers (APPs -  Physician Assistants and Nurse Practitioners) who all work together to provide you with the care you need, when you need it.  . You will need a follow up appointment in 12 months  . Providers on your designated Care Team:   . Murray Hodgkins, NP . Christell Faith, PA-C . Marrianne Mood, PA-C   COVID-19 Vaccine Information can be found at: ShippingScam.co.uk For questions related to vaccine distribution or appointments, please email [email or web address] or call [phone].

## 2020-05-23 ENCOUNTER — Other Ambulatory Visit: Payer: Self-pay | Admitting: *Deleted

## 2020-05-23 MED ORDER — DOXAZOSIN MESYLATE 8 MG PO TABS: 8.0000 mg | ORAL_TABLET | Freq: Two times a day (BID) | ORAL | 11 refills | Status: DC

## 2020-05-23 NOTE — Telephone Encounter (Signed)
Requested Prescriptions   Signed Prescriptions Disp Refills   doxazosin (CARDURA) 8 MG tablet 60 tablet 11    Sig: Take 1 tablet (8 mg total) by mouth 2 (two) times daily.    Authorizing Provider: Minna Merritts    Ordering User: Britt Bottom

## 2020-05-23 NOTE — Telephone Encounter (Signed)
Pharmacy calling States patient takes doxazosin 8 MG 2 times daily  Please review and resend prescription to [facility]

## 2020-05-23 NOTE — Telephone Encounter (Signed)
Requested Prescriptions   Signed Prescriptions Disp Refills  . doxazosin (CARDURA) 8 MG tablet 60 tablet 11    Sig: Take 1 tablet (8 mg total) by mouth 2 (two) times daily.    Authorizing Provider: Minna Merritts    Ordering User: Britt Bottom

## 2020-06-29 ENCOUNTER — Telehealth: Payer: Self-pay

## 2020-06-29 ENCOUNTER — Telehealth: Payer: Self-pay | Admitting: Cardiovascular Disease

## 2020-06-29 MED ORDER — ROSUVASTATIN CALCIUM 10 MG PO TABS: 10.0000 mg | ORAL_TABLET | Freq: Every day | ORAL | 0 refills | Status: AC

## 2020-06-29 NOTE — Telephone Encounter (Signed)
Pt c/o medication issue:  1. Name of Medication: Amiodarone   2. How are you currently taking this medication (dosage and times per day)? 200mg  1 tablet a day  3. Are you having a reaction (difficulty breathing--STAT)? THL elevated  4. What is your medication issue? Provider thinks causing it to elevate

## 2020-06-29 NOTE — Telephone Encounter (Signed)
Yes, amiodarone can cause thyroid disease. This needs to be addressed by Dr. Rockey Situ.

## 2020-06-30 NOTE — Telephone Encounter (Signed)
Pt informed me that he has switched providers he is going to a place in [place]. He will have them to refill the medication

## 2020-07-04 NOTE — Telephone Encounter (Signed)
Attempted to reach out to pt, unable to get in touch via phone, answering machine stated"machine is off" therefore, could not leave a message.   Attempted to reach out to pt's son, [name] (DPR approved) unable to reach as well, LMTCB to discuss Sharod's medication.

## 2020-07-04 NOTE — Telephone Encounter (Signed)
Hold the amiodarone given thyroid disorder Without the metoprolol, which he held on his own previously , there is nothing to prevent atrial fibrillation Would get back on metoprolol succinate dose of 25 mg daily Can we send TSH result to primary care, needs follow-up TSH 4 to 6 weeks

## 2020-07-05 ENCOUNTER — Telehealth: Payer: Self-pay

## 2020-07-05 MED ORDER — METOPROLOL SUCCINATE ER 25 MG PO TB24: 25.0000 mg | ORAL_TABLET | Freq: Every day | ORAL | 3 refills | Status: DC

## 2020-07-05 NOTE — Telephone Encounter (Signed)
Was able to reach out to pt, made contact via phone, advised on Dr. Althea Charon advice of amiodarone and elevated TSH levels  Hold the amiodarone given thyroid disorder Without the metoprolol, which he held on his own previously , there is nothing to prevent atrial fibrillation Would get back on metoprolol succinate dose of 25 mg daily Can we send TSH result to primary care, needs follow-up TSH 4 to 6 weeks  [name] verbalized understanding, asked to send to his PCP Dr. Baldemar Lenis with [name], will send secure message to his office as requested.   Metoprolol sent in to [facility] Amiodarone removed from medication list.   [name] is thankful for the call and will stop the amiodarone, start metoprolol, and will have his TSH repeated by PCP. Will call back with any further concerns.

## 2020-07-05 NOTE — Telephone Encounter (Signed)
Faxed Dr. Donivan Scull advice to Dr. Baldemar Lenis  Hold the amiodarone given thyroid disorder Without the metoprolol, which he held on his own previously , there is nothing to prevent atrial fibrillation Would get back on metoprolol succinate dose of 25 mg daily Can we send TSH result to primary care, needs follow-up TSH 4 to 6 weeks  Sent updated med list as well with medication changes

## 2020-07-05 NOTE — Addendum Note (Signed)
Addended by: Wynema Birch on: 07/05/2020 10:53 AM   Modules accepted: Orders

## 2020-07-28 ENCOUNTER — Telehealth: Payer: Self-pay | Admitting: Cardiovascular Disease

## 2020-07-28 DIAGNOSIS — Z01818 Encounter for other preprocedural examination: Secondary | ICD-10-CM

## 2020-07-28 DIAGNOSIS — Z953 Presence of xenogenic heart valve: Secondary | ICD-10-CM

## 2020-07-28 NOTE — Telephone Encounter (Signed)
Can you please request a formal preop clearance form so that I can address their questions?

## 2020-07-28 NOTE — Telephone Encounter (Signed)
I was given some information by one of the nurses in our [place] office the pt is having an eye injection done by Dr. Sarajane Marek. I proceeded to call Dr. Beacher May office and s/w Ginger who states they have never seen this pt before. I then called and left a message for the pt to please call back to confirm name of the doctor who will be doing his procedure.

## 2020-07-28 NOTE — Telephone Encounter (Signed)
Patient has an eyelid surgery on 8/25 wants to know if he can be off Eliquis 2days before surgery & can he be put to sleep. Please advise

## 2020-07-29 NOTE — Telephone Encounter (Signed)
Patient returning call.

## 2020-07-29 NOTE — Telephone Encounter (Signed)
Spoke with patient  and asked him if he had the doctors name number of who will be performing him procedure. The patient immediately began to tell me that he is having a procedure on his eye lid he states Dr Sabino Dick is going to do his surgery. He states the procedure is scheduled for 09/22/20. He states the doctor him they would like for him to hold his Eliquis for 2 days prior and once day after. I advised patient that I need the doctors name and phone number and I would contact them.   I advised patient that I would reach out to Dr Rubinstein's office for them to send our office a clearance request so we can proceed properly. He voiced understanding.  I contacted Dr Beacher May office but they were closed. I left a detailed message requesting they send over a clearance letter to our office with necessary information and left our fax number. Awaiting a callback or a fax.

## 2020-08-02 NOTE — Telephone Encounter (Signed)
Ginger from Dr. Beacher May office called back. The office states they do not have this patient in their records. Please contact the number provided [phone]) to follow up with Ginger

## 2020-08-02 NOTE — Telephone Encounter (Signed)
Returned call and left message for Ginger with [facility] in regards to Mr. Dahle.

## 2020-08-08 NOTE — Telephone Encounter (Signed)
I s/w Anda Kraft with Dr. Quillian Quince Rubenstein's office. Per Anda Kraft yes clearance did state hold Eliquis x 4 days; however Anda Kraft states that if the cardiologist is fine holding Eliquis x 2 days that is perfectly acceptable. Anda Kraft states they will follow the directions and recommendations from the cardiologist. I thanked Anda Kraft for her help. I assured her that I will let our pre op team know ok then to hold Eliquis x 2 days if needed.

## 2020-08-08 NOTE — Telephone Encounter (Signed)
Ginger is calling in from Dr. Beacher May and stating once again the patients is not a patient of theirs. Rubinstein's office will not be able to be of assistance. Please advise patient for further help

## 2020-08-08 NOTE — Telephone Encounter (Signed)
Callback - can you please clarify if they want a four DAY hold or a four DOSE hold?

## 2020-08-08 NOTE — Telephone Encounter (Signed)
I called the pt back and confirmed the correct phone # to reach Dr. Letitia Caul Rubenstein's office is [phone]. Dr. Leonard Schwartz is with [facility]. I apologized to the pt that is seems that the message we received did  not have all complete information that was needed to reach the correct office in regards to pt's surgery. I assured the pt now that I have the correct information I will call the correct office and get a clearance request to be faxed over to our office for his upcoming surgery. Pt was very understanding. I then s/w [facility] and left a message for the surgery scheduler that I will need a clearance request to be faxed to our office [phone] ATTN: PRE OP TEAM/Isaiyah Feldhaus. Will need procedure being performed, anesthesia that may be used, any specific request how long they would like for Eliquis to be held. Pt did also state to me that he prefer to be put to sleep and so he just wanted to make sure ok to hold Eliquis x 2 days and can he be put to sleep. Once I receive the clearance form I will get the information into the system and forward to the pre op pool for assessment.

## 2020-08-08 NOTE — Telephone Encounter (Signed)
PLEASE DISREGARD IN PREVIOUS NOTES STATING DR. TAL RUBENSTEIN WAS THE SURGEON FO PROCEDURE. CORRECT SURGEON IS DR. DANIEL RUBENSTEIN.

## 2020-08-08 NOTE — Telephone Encounter (Signed)
Patient with diagnosis of A Fib on Eliquis for anticoagulation.    Procedure: blepharoplasty Date of procedure: 09/22/20   CHA2DS2-VASc Score = 5  This indicates a 7.2% annual risk of stroke. The patient's score is based upon: CHF History: No HTN History: Yes Diabetes History: Yes Stroke History: No Vascular Disease History: Yes Age Score: 2 Gender Score: 0    CrCl 34 mL/min Platelet count 172K  Per office protocol, patient can hold Eliquis for 2 days prior to procedure.

## 2020-08-08 NOTE — Telephone Encounter (Signed)
Patient reaching out to follow up on clearance status.    Attempted to reach surgeons office for update. Lmov .

## 2020-08-08 NOTE — Telephone Encounter (Signed)
Per request from pre op provider to confirm with surgeon's office if requesting to hold Elquis x 4 days or 4 doses. Per clearance form states 4 days. I did call Dr. Quillian Quince Rubentstein's office to clarify days or doses. Woman who answered said they are at lunch and she will have to page them to call our office back. I said that would be fine, gave [phone]. I was asked who the doctor is that the pt is having surgery with. I stated Dr. Kandis Cocking. Woman said they don't have a doctor there by that name. I stated I just s/w someone earlier this morning and confirmed surgeon and the office, as well as I just received a clearance request from their office.

## 2020-08-08 NOTE — Telephone Encounter (Signed)
I have obtained a fax # for [facility] and will fax these notes currently to their office.

## 2020-08-08 NOTE — Telephone Encounter (Signed)
   [facility] Pre-operative Risk Assessment    Patient Name: Timothy Riley  DOB: 28-May-1940 MRN: [id number]  HEARTCARE STAFF:  - IMPORTANT!!!!!! Under Visit Info/Reason for Call, type in Other and utilize the format Clearance MM/DD/YY or Clearance TBD. Do not use dashes or single digits. - Please review there is not already an duplicate clearance open for this procedure. - If request is for dental extraction, please clarify the # of teeth to be extracted. - If the patient is currently at the dentist's office, call Pre-Op Callback Staff (MA/nurse) to input urgent request.  - If the patient is not currently in the dentist office, please route to the Pre-Op pool.  Request for surgical clearance:  What type of surgery is being performed? BLEPHAROPLASTY  When is this surgery scheduled? 09/22/20  What type of clearance is required (medical clearance vs. Pharmacy clearance to hold med vs. Both)? BOTH  Are there any medications that need to be held prior to surgery and how long?  ELIQUIS x 4 PER CLEARANCE REQUEST   Practice name and name of physician performing surgery? [place]; DR. Quillian Quince RUBENSTEIN  What is the office phone number? [phone]   7.   What is the office fax number? [phone]  8.   Anesthesia type (None, local, MAC, general) ? NOT LISTED    Julaine Hua 08/08/2020, 11:07 AM  _________________________________________________________________   (provider comments below)

## 2020-08-09 NOTE — Telephone Encounter (Signed)
LV at home and cell phone, mailbox full.

## 2020-08-11 ENCOUNTER — Telehealth: Payer: Self-pay | Admitting: Cardiovascular Disease

## 2020-08-11 NOTE — Telephone Encounter (Signed)
error 

## 2020-08-16 NOTE — Telephone Encounter (Signed)
Attempted to contact patient as part of preoperative cardiac protocol.  Unable to leave voice message.  His mailbox is full.  Patient is call back.

## 2020-08-18 NOTE — Telephone Encounter (Signed)
Echo is being done for pre op clearance as well as Dr. Rockey Situ has been re-checking echo every 6 months.

## 2020-08-18 NOTE — Telephone Encounter (Signed)
Left message for pt to call back and schedule an echo to be done for pre op clearance. I have held a spot 09/13/20 @ 4 pm for pt to have echo in [place] office. Left message for pt to call back and either schedule on the date and time I have held for him or change to another day if need be. We are trying to see if we can get the echo done before his procedure per the pre op provider.

## 2020-08-18 NOTE — Telephone Encounter (Signed)
Patient called back and scheduled his echo. Patient is also curious if anyone who is working on his clearance knows anymore about his surgery date  Please advise

## 2020-08-18 NOTE — Telephone Encounter (Signed)
   Name: Timothy Riley  DOB: 1940/09/12  MRN: [id number]   Primary Cardiologist: None  Chart reviewed as part of pre-operative protocol coverage. 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 03/2018 echo showed EF 60 to 65%, mild LVH, mild diastolic dysfunction, bovine valve with AV mean gradient 16.0 mmHg, RAP 10 mmHg.  Timothy Riley was last seen on 05/11/2020 by Dr. Rockey Situ of [facility] clinic.  Amlodipine was discontinued to reduce leg swelling and Cardura increased for BP support.  He was maintaining NSR. Repeat echo was recommended every 6 months for his AVR with 05/11/2020 echo ordered but not yet obtained.    Patient was contacted by myself today, 08/18/2020, for reassessment of his symptoms. Since his OV, Timothy Riley has done well and denies chest pain or shortness of breath.  His most strenuous activity is weeding and mowing, without CP/SOB, earning at least 4 METS of activity.  Based on PMH/labs, calculated RCRI >11% risk of MACE, placing him high risk for the upcoming procedure.  The patient was advised that if he develops new symptoms prior to surgery to contact our office to arrange for a follow-up visit, and he verbalized understanding.  Pharmacy recommendations as below reviewed to hold anticoagulation for 2 days prior to procedure. ---------------------------------------------  Routing to his primary cardiologist Dr. Rockey Situ : (1) Can you advise if his recommended echo needs to occur before the 8/25 procedure? (2) Please route your response to the preop pool.  Routing to  Call back pool: (1) Please schedule pt for repeat echo, hopefully before his  procedure 09/22/2020.   (2) Please update the surgeon's office of this need for an echo prior to his procedure and status of clearance given time it has been in our box.  Per pharmacy: He should hold his Eliquis for 2 days prior to his procedure.  Once echo results are received, note can be bundled and sent off to surgeon. Recommend updating the office before then,  Arvil Chaco, PA-C 08/18/2020, 7:45 AM

## 2020-08-18 NOTE — Telephone Encounter (Signed)
I s/w the pt and he is aware the we have the pre op clearance in process and that we are doing the echo early for Dr. Rockey Situ so that we can try and clear him for his upcoming surgery. Pt thanked me for the call and the help.

## 2020-08-18 NOTE — Addendum Note (Signed)
Addended by: Michae Kava on: 08/18/2020 08:58 AM   Modules accepted: Orders

## 2020-08-22 NOTE — Telephone Encounter (Signed)
   Primary Cardiologist: Ida Rogue, MD  Chart reviewed as part of pre-operative protocol coverage. Given past medical history and time since last visit, based on ACC/AHA guidelines, Timothy Riley would be at acceptable risk for the planned procedure without further cardiovascular testing.   Per Dr. Rockey Situ his Eliquis may be held for 2-3 days prior to his procedure.  Please resume as soon as hemostasis is achieved.  I will route this recommendation to the requesting party via Epic fax function and remove from pre-op pool.  Please call with questions.  [name] NP-C    08/22/2020, [place] [address] Office [phone] Fax [phone]

## 2020-09-13 ENCOUNTER — Other Ambulatory Visit: Payer: Self-pay

## 2020-09-13 ENCOUNTER — Ambulatory Visit (INDEPENDENT_AMBULATORY_CARE_PROVIDER_SITE_OTHER): Payer: Medicare Other

## 2020-09-13 DIAGNOSIS — Z953 Presence of xenogenic heart valve: Secondary | ICD-10-CM

## 2020-09-13 DIAGNOSIS — Z01818 Encounter for other preprocedural examination: Secondary | ICD-10-CM

## 2020-09-13 DIAGNOSIS — I5020 Unspecified systolic (congestive) heart failure: Secondary | ICD-10-CM

## 2020-09-13 LAB — ECHOCARDIOGRAM COMPLETE
AR max vel: 2 cm2
AV Area VTI: 2.06 cm2
AV Area mean vel: 2.06 cm2
AV Mean grad: 13 mmHg
AV Peak grad: 23.9 mmHg
Ao pk vel: 2.45 m/s
Area-P 1/2: 3.1 cm2
Calc EF: 50.4 %
S' Lateral: 3.9 cm
Single Plane A2C EF: 50.6 %
Single Plane A4C EF: 50.2 %

## 2020-09-19 ENCOUNTER — Other Ambulatory Visit: Payer: Self-pay | Admitting: Family Medicine

## 2020-09-19 ENCOUNTER — Other Ambulatory Visit: Payer: Self-pay | Admitting: Cardiovascular Disease

## 2020-09-20 NOTE — Telephone Encounter (Signed)
Prescription refill request for Eliquis received. Indication:afib Last office visit:gollan 05/11/20 Scr:2.4 06/24/20 Age: 68m Weight:99kg

## 2020-09-22 ENCOUNTER — Telehealth: Payer: Self-pay

## 2020-09-22 NOTE — Telephone Encounter (Signed)
Attempted to call pt's numbers on file, home and cell are both non-working numbers.  [phone]  Called pt's son [name] (DPR approved), he reports that pt has moved and since then has gotten ride of both his numbers that are listed on file. [name] stated pt's new number is [phone].  Did review pt's results with son [name] (DPR approved)  pt's recent ECHO read by Dr. Rockey Situ and advised   "Echocardiogram  Low normal ejection fraction  EF estimated 50 to 55%  Tricuspid valve with mild to moderately,  Aortic valve bioprosthetic, functioning well "  [name] thankful for calling him with the results, he will call his father to let him know of the result as [name] handles all his dads medical care. All questions or concerns were address and no additional concerns at this time.   Will update pt's phone numbers on file at this time.

## 2020-10-25 ENCOUNTER — Telehealth: Payer: Self-pay

## 2020-10-25 NOTE — Telephone Encounter (Signed)
Pt is aware that we are limited on Eliquis samples.  I have suggested for pt to apply for Patient Assistance program. Pt is requesting samples due to cost and unable to afford the medication. Pt's cost increased from$37-$127 dollars and that is something that he may not continue to afford. I have printed application and coupon cards upfront for his use. Please advise if ok to give pt samples to hold him until he is approved for Eliquis.

## 2020-10-25 NOTE — Telephone Encounter (Signed)
Pt called requesting Eliquis 2.5mg  samples. Indication: Atrial Fib Last office visit: 05/11/20  Johnny Bridge MD Scr: 2.4 on 06/24/20 Age: 80 Weight: 99kg  Based on above findings Eliquis 2.5mg  twice daily is the appropriate dose.  OK to give samples if available.

## 2020-10-25 NOTE — Telephone Encounter (Signed)
2 Boxes

## 2020-10-25 NOTE — Telephone Encounter (Signed)
Patient would like Eliquis samples. 

## 2020-10-25 NOTE — Telephone Encounter (Signed)
Lot: [id number] EXP: 01/2021

## 2020-11-10 ENCOUNTER — Other Ambulatory Visit: Payer: Medicare Other

## 2021-02-21 ENCOUNTER — Telehealth: Payer: Self-pay | Admitting: Cardiovascular Disease

## 2021-02-21 NOTE — Telephone Encounter (Signed)
Called patient and received a message stating that patient has a VM box that has not been set up. Put a hold on an appointment with Cadence Furth at 11:20 tomorrow (02/22/21), Will try and reach before noon. If unable will release the hold this afternoon.

## 2021-02-21 NOTE — Telephone Encounter (Signed)
Patient called back and spoke with Meriam Sprague and scheduled the appointment for tomorrow. Closing encounter.

## 2021-02-21 NOTE — Telephone Encounter (Signed)
Called patient back at requested phone number and left a VM requesting a call back.

## 2021-02-21 NOTE — Telephone Encounter (Signed)
Patient states to call [phone] - this is his sons number

## 2021-02-21 NOTE — Telephone Encounter (Signed)
Pt c/o Shortness Of Breath: STAT if SOB developed within the last 24 hours or pt is noticeably SOB on the phone  1. Are you currently SOB (can you hear that pt is SOB on the phone)? Yes, but not noticeably   2. How long have you been experiencing SOB? Since November   3. Are you SOB when sitting or when up moving around? both  4. Are you currently experiencing any other symptoms? Unable to sleep   Patient wanting to be seen ASAP.  Please call to discuss.

## 2021-02-22 ENCOUNTER — Other Ambulatory Visit: Payer: Self-pay

## 2021-02-22 ENCOUNTER — Ambulatory Visit: Payer: Medicare Other | Admitting: Medical

## 2021-02-22 ENCOUNTER — Encounter: Payer: Self-pay | Admitting: Medical

## 2021-02-22 VITALS — BP 170/80 | HR 73 | Ht 65.0 in | Wt 219.0 lb

## 2021-02-22 DIAGNOSIS — R0602 Shortness of breath: Secondary | ICD-10-CM | POA: Diagnosis not present

## 2021-02-22 DIAGNOSIS — I519 Heart disease, unspecified: Secondary | ICD-10-CM

## 2021-02-22 DIAGNOSIS — Z952 Presence of prosthetic heart valve: Secondary | ICD-10-CM

## 2021-02-22 DIAGNOSIS — N183 Chronic kidney disease, stage 3 unspecified: Secondary | ICD-10-CM

## 2021-02-22 DIAGNOSIS — I10 Essential (primary) hypertension: Secondary | ICD-10-CM

## 2021-02-22 DIAGNOSIS — I4819 Other persistent atrial fibrillation: Secondary | ICD-10-CM

## 2021-02-22 DIAGNOSIS — G4730 Sleep apnea, unspecified: Secondary | ICD-10-CM | POA: Diagnosis not present

## 2021-02-22 DIAGNOSIS — I2510 Atherosclerotic heart disease of native coronary artery without angina pectoris: Secondary | ICD-10-CM

## 2021-02-22 MED ORDER — FUROSEMIDE 20 MG PO TABS: 20.0000 mg | ORAL_TABLET | Freq: Every day | ORAL | 3 refills | Status: DC

## 2021-02-22 NOTE — Patient Instructions (Signed)
Medication Instructions:  Your physician has recommended you make the following change in your medication:   START taking furosemide (Lasix) 20 mg daily   *If you need a refill on your cardiac medications before your next appointment, please call your pharmacy*   Lab Work:  Your provider has ordered labs (TSH, BNP, CBC) to be drawn today before you leave.   Your physician recommends that you return for lab work (BMET) in: 1 week    Please return to our office on_____________________at______________am/pm   If you have labs (blood work) drawn today and your tests are completely normal, you will receive your results only by: [place] (if you have MyChart) OR A paper copy in the mail If you have any lab test that is abnormal or we need to change your treatment, we will call you to review the results.   Testing/Procedures: None ordered   Follow-Up: At [facility], you and your health needs are our priority.  As part of our continuing mission to provide you with exceptional heart care, we have created designated Provider Care Teams.  These Care Teams include your primary Cardiologist (physician) and Advanced Practice Providers (APPs -  Physician Assistants and Nurse Practitioners) who all work together to provide you with the care you need, when you need it.  We recommend signing up for the patient portal called "MyChart".  Sign up information is provided on this After Visit Summary.  MyChart is used to connect with patients for Virtual Visits (Telemedicine).  Patients are able to view lab/test results, encounter notes, upcoming appointments, etc.  Non-urgent messages can be sent to your provider as well.   To learn more about what you can do with MyChart, go to [email or web address].    Your next appointment:   2 week(s)  The format for your next appointment:   In Person  Provider:   You may see Ida Rogue, MD or one of the following Advanced Practice Providers on  your designated Care Team:   Murray Hodgkins, NP Christell Faith, PA-C Cadence Kathlen Mody, PA-C1}    Other Instructions We have referred you to pulmonology. They will reach out to you to set up an appointment

## 2021-02-22 NOTE — Progress Notes (Signed)
Cardiology Office Note:    Date:  02/22/2021   ID:  MCIHAEL Riley, DOB 1940-07-27, MRN [id number]  PCP:  Derinda Late, MD  [facility] Cardiologist:  Ida Rogue, MD  [facility] Electrophysiologist:  None   Referring MD: Derinda Late, MD   Chief Complaint: shortness of breath  History of Present Illness:    Timothy Riley is a 81 y.o. male with a hx of persistent A. fib, former smoker, severe aortic valve stenosis status post AVR 03/01/2014 at [facility] with bovine valve, SVT episodes since his AVR requiring ablation 06/30/2014 at [facility], diabetes type 2, BPH, normal EF who presents for shortness of breath.  A. fib dates back to October 2019, he was seen in the ER and started on anticoagulation with Eliquis.  History of hypotension previously stopped tamsulosin and chlorthalidone.  Notes from outside cardiology offices suggest mild coronary artery disease.  Myoview Lexi scan in 2020 was normal without ischemia or scar, LVEF 59%, overall low risk study.  Echo 09/17/2020 showed LVEF 50 to 55%, mild LVH, grade 2 diastolic dysfunction, moderately dilated left atrium, mildly dilated right atrium, mild MR, mild to moderate TR, mild to moderate AI.  Seen in October 2021, at that time blood pressure was good so metoprolol was stopped.  Last seen 05/11/2020.  He has chronic leg swelling on Lasix 1-2 times a week.  EKG showed normal sinus rhythm. Amlodipine was stopped for LLE.   Today, the patient reports he had bronchitis, covid and the flu since November 2022. Has been on three rounds of steroids. Still is short of breath. Worse when he does something or leans over. O2 level is good.  No chest pain. Says he doesn't sleep at night because he is concerned of not breathing. He sleeps during the day, all day. Appears to have anxiety around health issues, always afraid it may be his time. Reports swelling on his feet. This has been going on for at least a week. Has not been taking lasix.    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 Glower MD @ [facility] via R thoracic approach; b. 03/2018 Echo: EF 60-65%, DD. Mildly dil LA. Nl fxn AoV prosthesis. Grad 11mmHg.   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 12/2013 Cath ([facility]): LAD 30p, RI 60, otw nl (per CT surgery note from [facility]); b. 03/2018 MV: EF 59%, no ischemia/scar.   Persistent atrial fibrillation ([place]) 10/2017   a. CHA2DS2VASc = 5-->Eliquis; b. 12/2017 loaded w/amio-->DCCV.   PSVT (paroxysmal supraventricular tachycardia) ([place])    a. 05/2014 s/p RFCA @ [facility].   Right inguinal hernia    Urinary incontinence     Past Surgical History:  Procedure Laterality Date   AORTIC VALVE REPLACEMENT     CARDIAC CATHETERIZATION     CARDIOVERSION N/A 01/01/2018   Procedure:  CARDIOVERSION;  Surgeon: Minna Merritts, MD;  Location: [facility] ORS;  Service: Cardiovascular;  Laterality: N/A;   COLONOSCOPY  2010   heart valve replacment  2015   XI ROBOTIC ASSISTED INGUINAL HERNIA REPAIR WITH MESH Right 01/07/2019   Procedure: XI ROBOTIC ASSISTED INGUINAL HERNIA REPAIR WITH MESH;  Surgeon: Ronny Bacon, MD;  Location: [facility] ORS;  Service: General;  Laterality: Right;    Current Medications: Current Meds  Medication Sig   amoxicillin (AMOXIL) 500 MG capsule TAKE 4 CAPSULES 1 HOUR BEFORE DENTAL APPOINTMENT   Cholecalciferol (VITAMIN D3  PO) Take by mouth daily.   doxazosin (CARDURA) 8 MG tablet Take 1 tablet (8 mg total) by mouth 2 (two) times daily.   ELIQUIS 2.5 MG TABS tablet TAKE 1 TABLET BY MOUTH TWICE A DAY   furosemide (LASIX) 20 MG tablet Take 1 tablet (20 mg total) by mouth daily.   glipiZIDE (GLUCOTROL) 10 MG tablet TAKE 1 TABLET BY MOUTH TWICE A DAY BEFORE A MEAL.   metoprolol succinate (TOPROL-XL) 25 MG 24 hr tablet Take 1 tablet (25 mg total) by mouth daily.   pioglitazone (ACTOS) 30 MG tablet TAKE 1 TABLET BY MOUTH ONCE DAILY   predniSONE (DELTASONE) 10 MG tablet Take 10 mg by mouth daily with breakfast.   rosuvastatin (CRESTOR) 10 MG tablet Take 1 tablet (10 mg total) by mouth daily.     Allergies:   Gabapentin, Hydralazine, and Hydralazine hcl   Social History   Socioeconomic History   Marital status: Married    Spouse name: betty   Number of children: 2   Years of education: 12   Highest education level: High school graduate  Occupational History   Occupation: retired    Comment: truck Geophysicist/field seismologist  Tobacco Use   Smoking status: Former    Packs/day: 0.50    Years: 1.00    Pack years: 0.50    Types: Cigarettes    Quit date: 01/30/1972    Years since quitting: 49.0   Smokeless tobacco: Never   Tobacco comments:    over 23 yrs ago  Vaping Use   Vaping Use: Never used  Substance and Sexual Activity   Alcohol use: No    Alcohol/week: 0.0 standard drinks   Drug use: No   Sexual activity: Not Currently  Other Topics Concern   Not on file  Social History Narrative   Not on file   Social Determinants of Health   Financial Resource Strain: Not on file  Food Insecurity: Not on file  Transportation Needs: Not on file  Physical Activity: Not on file  Stress: Not on file  Social Connections: Not on file     Family History: The patient's family history includes Cancer in his mother; Stroke in his father.  ROS:   Please see the history of present illness.     All other systems reviewed and are  negative.  EKGs/Labs/Other Studies Reviewed:    The following studies were reviewed today:  Echo 08/2020  1. Left ventricular ejection fraction, by estimation, is 50 to 55%. The  left ventricle has low normal function. Left ventricular endocardial  border not optimally defined to evaluate regional wall motion. There is  mild left ventricular hypertrophy. Left  ventricular diastolic parameters are consistent with Grade II diastolic  dysfunction (pseudonormalization). Elevated left atrial pressure.   2. Right ventricular systolic function is normal. The right ventricular  size is normal. There is moderately elevated pulmonary artery systolic  pressure.   3. Left  atrial size was moderately dilated.   4. Right atrial size was mildly dilated.   5. The mitral valve is abnormal. Mild mitral valve regurgitation. No  evidence of mitral stenosis.   6. Tricuspid valve regurgitation is mild to moderate.   7. The aortic valve has been repaired/replaced. Aortic valve  regurgitation is mild to moderate. There is a 23 mm Edwards bioprosthetic  valve present in the aortic position. Procedure Date: 03/01/14. Aortic valve  mean gradient measures 13.0 mmHg.   8. The inferior vena cava is dilated in size with >50% respiratory  variability, suggesting right atrial pressure of 8 mmHg.   Myoview Lexiscan 2020 Narrative & Impression  Normal pharmacologic myocardial perfusion stress test without ischemia or scar. The left ventricular ejection fraction is normal by visual estimation and Siemens calculation (59%). LVEF by QGS is likely reduced due to gating artifact (42%). This is a low risk study.   EKG:  EKG is  ordered today.  The ekg ordered today demonstrates SR with PACs, RBBB, 73bpm.   Recent Labs: No results found for requested labs within last 8760 hours.  Recent Lipid Panel    Component Value Date/Time   CHOL 135 12/23/2018 0000   TRIG 185 (H) 12/23/2018 0000   HDL 28 (L) 12/23/2018 0000    CHOLHDL 4.8 12/23/2018 0000   VLDL 70 (H) 06/29/2016 1408   LDLCALC 79 12/23/2018 0000    Physical Exam:    VS:  BP (!) 170/80 (BP Location: Left Arm, Patient Position: Sitting, Cuff Size: Large)    Pulse 73    Ht 5\' 5"  (1.651 m)    Wt 219 lb (99.3 kg)    SpO2 94%    BMI 36.44 kg/m     Wt Readings from Last 3 Encounters:  02/22/21 219 lb (99.3 kg)  05/11/20 218 lb 4 oz (99 kg)  11/10/19 213 lb (96.6 kg)     GEN:  Well nourished, well developed in no acute distress HEENT: Normal NECK: No JVD; No carotid bruits LYMPHATICS: No lymphadenopathy CARDIAC: RRR, no murmurs, rubs, gallops RESPIRATORY:  Clear to auscultation without rales, wheezing or rhonchi  ABDOMEN: Soft, non-tender, non-distended MUSCULOSKELETAL:  No edema; No deformity  SKIN: Warm and dry NEUROLOGIC:  Alert and oriented x 3 PSYCHIATRIC:  Normal affect   ASSESSMENT:    1. Shortness of breath   2. Persistent atrial fibrillation ([place])   3. Systolic dysfunction   4. Sleep apnea, unspecified type   5. Stage 3 chronic kidney disease, unspecified whether stage 3a or 3b CKD ([place])   6. Essential hypertension   7. S/P AVR   8. Coronary artery disease involving native coronary artery of native heart without angina pectoris    PLAN:    In order of problems listed above:  Persistent SOB H/o COVID/Flu/bronchitis Reports persistent SOB since COVID/Flu/bronchitis in November 2022. He has been on three rounds of steroids. Breathing is worse with exertion and bending over, says he can lay flat. Has some LLE on exam, he stopped taking lasix. Also he is not sleeping at night, seems to be very worried about breathing. He is sleeping a lot during the day. Has some audible wheezing, I will give albuterol inhaler. I will refer to pulmonology for sleep study and possible PFTs. I will check CBC, TsH and BNP. Restart lasix.   Leg swelling HFpEF Amlodipine stopped at the last visit and LLE improved. Patient stopped lasix a couple  months ago, seems he was taking is as  needed (1-2 times weekly), He has also been on steroids for 3 months. He has 2+ lower leg edema on exam. Lungs clear. Restart lasix 20mg  daily. BNP today. BMET in a week. CHF education discussed.   Persistent atrial fibrillation In SR with PACs today. Continue a/c with Eliquis 2.5mg  BID. Continue rate control with Toprol 25mg  daily.   CKD Most recent creatinine 1.6, BUN 25. BMET in a week with initiation on lasix.   Status post AVR He had recent Echo in 08/2020 that showed LVEF 50-55%, mild to mod TR, normally functioning valve. Discussed repeat echo, but we will wait at this time.   Hypertension BP up, but also is volume overloaded and very anxious. Restart lasix as above. Continue other antihypertensives.  Mild CAD No chest pain reported. No ischemic testing at this time. Continue BB and statin. No asa with Eliquis.   Disposition: Follow up in 2 week(s) with MD/APP    Signed, Jerald Villalona Ninfa Meeker, PA-C  02/22/2021 12:05 PM    [facility]

## 2021-02-23 LAB — CBC
Hematocrit: 32.5 % — ABNORMAL LOW (ref 37.5–51.0)
Hemoglobin: 10.6 g/dL — ABNORMAL LOW (ref 13.0–17.7)
MCH: 29.4 pg (ref 26.6–33.0)
MCHC: 32.6 g/dL (ref 31.5–35.7)
MCV: 90 fL (ref 79–97)
Platelets: 172 10*3/uL (ref 150–450)
RBC: 3.61 x10E6/uL — ABNORMAL LOW (ref 4.14–5.80)
RDW: 15.2 % (ref 11.6–15.4)
WBC: 6.6 10*3/uL (ref 3.4–10.8)

## 2021-02-23 LAB — TSH: TSH: 6.4 u[IU]/mL — ABNORMAL HIGH (ref 0.450–4.500)

## 2021-02-23 LAB — BRAIN NATRIURETIC PEPTIDE: BNP: 938.3 pg/mL — ABNORMAL HIGH (ref 0.0–100.0)

## 2021-02-24 ENCOUNTER — Telehealth: Payer: Self-pay | Admitting: *Deleted

## 2021-02-24 NOTE — Telephone Encounter (Signed)
-----   Message from [place], PA-C sent at 02/24/2021 11:51 AM EST ----- Labs showed volume overload, agree with restarting lasix. Lets have him take lasix 20mg  BID for 3 days then back down to 20mg  daily.  TSH up, lets check Ft4>> ultimately PCP can follow

## 2021-02-24 NOTE — Telephone Encounter (Signed)
No answer/No voicemail box set up.  

## 2021-03-01 ENCOUNTER — Other Ambulatory Visit (INDEPENDENT_AMBULATORY_CARE_PROVIDER_SITE_OTHER): Payer: Medicare Other

## 2021-03-01 ENCOUNTER — Other Ambulatory Visit: Payer: Self-pay

## 2021-03-01 ENCOUNTER — Telehealth: Payer: Self-pay

## 2021-03-01 DIAGNOSIS — I519 Heart disease, unspecified: Secondary | ICD-10-CM | POA: Diagnosis not present

## 2021-03-01 NOTE — Telephone Encounter (Signed)
Patient's son returned call.   Explained to son why I was calling. Went over previous results that I had been attempting to reach patient in regards to and the PAs recommendations. Timothy Riley, son, verbalized understanding.   Son reports that pt has been having a lot of complaints at home recently. Reports:   - Pt states that he can't breath, which is his main complaint. Per son, pt will frequently complain of this but hs no obvious signs of breathing difficulty. Wants to be referred to pulmonary because of this issue.   - Is not sleeping at night. Pt will be up all night in the living room watching TV because he states that he can't sleep at night. During the day, he will leave the house around noon, go to get a biscuit, then park his truck somewhere with the heat cranked and sleep in his truck for several hours during the day. Per patient, this is the only way he can sleep due to his SOB. Pt saw his PCP on 1/19 and was given 50 mg of trazodone to take at bedtime. Pt reportedly took this twice, but it didn't work so he stopped taking it. Recommended to son that he reach out to PCP to see if they can increase the dose.   - Son is concerned that pt has underlying anxiety and depression and needs to be seen by psychiatry. He keeps a bag packed in his truck in the event that he needs to be admitted to the hospital. Son feels as though he is just waiting to die. Wonders if something like xanax may be of some benefit to pt.   - Pt expressed dissatisfaction with PA. Son was at last visit and said that PA was wonderful, but his dad feels like he always needs to see the MD and doesn't always respond well to PA/NPs. Explained to pt's son that it would be of little benefit to patient for him to see a PA again if he doesn't want to participate in the visit, so appt rescheduled from [place], [place] to Dr. Rockey Situ on 2/27 at 4:20 pm. Pt added to wait list. Son verbalized understanding and stated that he would attempt to come  to the appt with his father. Also stated that he should be the one called if a sooner appt comes up.   Timothy Riley voiced appreciation for the call and understands that they can reach out to our office with any questions or concerns.

## 2021-03-01 NOTE — Telephone Encounter (Signed)
Attempted to call patient, no answer, no voicemail box set up.   Attempted to call son (Ok per [facility]). No answer, lmtcb.

## 2021-03-01 NOTE — Telephone Encounter (Signed)
Patient was here today for labs.   Patient was very upset/irritated with the fact he is short of breath and still has swelling in his ankles.  He was very confused why Doxazosin isn't helping anymore with his swelling. He voiced a lot of concern on not being able to sleep at night due to his SOB.   He also stated that the lasix isn't making him urinate anymore than normal.  Patients ankles were swollen today.   Verified that the patient is taking Lasix 20MG  daily -- and he is.   Patient stated that his PCP put him on trazodone to see if that would help him sleep -- He tried it for 2 weeks and stopped because it was no help.   He stated as he was leaving that he was going to the park to try to get some sleep there because when he is at home all he does is move back and forth from the couch to his bed.   Patient stated that someone told him that it would take years to get rid of his SOB since he had COVID and he said he couldn't wait years to feel better.   He also stated that he would much rather see Dr. Rockey Situ due to the fact that a PA cannot answer all the questions he has. I made him aware that he does have an appt 03/10/2021 and he was be seeing Cadence Kathlen Mody, PA who he saw the last time.

## 2021-03-01 NOTE — Telephone Encounter (Signed)
Attempted to call patient to go over results and recommendations. No answer, no voicemail box set up.

## 2021-03-02 ENCOUNTER — Telehealth: Payer: Self-pay | Admitting: Emergency Medicine

## 2021-03-02 LAB — BASIC METABOLIC PANEL
BUN/Creatinine Ratio: 12 (ref 10–24)
BUN: 19 mg/dL (ref 8–27)
CO2: 29 mmol/L (ref 20–29)
Calcium: 9 mg/dL (ref 8.6–10.2)
Chloride: 104 mmol/L (ref 96–106)
Creatinine, Ser: 1.55 mg/dL — ABNORMAL HIGH (ref 0.76–1.27)
Glucose: 199 mg/dL — ABNORMAL HIGH (ref 70–99)
Potassium: 3.8 mmol/L (ref 3.5–5.2)
Sodium: 147 mmol/L — ABNORMAL HIGH (ref 134–144)
eGFR: 44 mL/min/{1.73_m2} — ABNORMAL LOW (ref >59)

## 2021-03-02 NOTE — Telephone Encounter (Signed)
Called patient to go over results. No answer, no voicemail set up.   Called patient's son Madaline Brilliant per [facility]). Results and recommendations gone over. Son verbalized understanding and questions (if any) answered. Son voiced appreciation for call.

## 2021-03-02 NOTE — Telephone Encounter (Signed)
-----   Message from [place], PA-C sent at 03/02/2021 11:02 AM EST ----- Kidney function stable, continue current lasix dose.

## 2021-03-07 ENCOUNTER — Encounter: Payer: Self-pay | Admitting: Emergency Medicine

## 2021-03-07 ENCOUNTER — Inpatient Hospital Stay
Admission: EM | Admit: 2021-03-07 | Discharge: 2021-03-15 | DRG: 291 | Disposition: A | Discharge status: Home / Self Care | Payer: Medicare Other | Attending: Internal Medicine | Admitting: Internal Medicine

## 2021-03-07 ENCOUNTER — Emergency Department: Payer: Medicare Other

## 2021-03-07 ENCOUNTER — Other Ambulatory Visit: Payer: Self-pay

## 2021-03-07 DIAGNOSIS — I130 Hypertensive heart and chronic kidney disease with heart failure and stage 1 through stage 4 chronic kidney disease, or unspecified chronic kidney disease: Secondary | ICD-10-CM | POA: Diagnosis not present

## 2021-03-07 DIAGNOSIS — Z888 Allergy status to other drugs, medicaments and biological substances status: Secondary | ICD-10-CM

## 2021-03-07 DIAGNOSIS — T501X6A Underdosing of loop [high-ceiling] diuretics, initial encounter: Secondary | ICD-10-CM | POA: Diagnosis present

## 2021-03-07 DIAGNOSIS — E785 Hyperlipidemia, unspecified: Secondary | ICD-10-CM | POA: Diagnosis present

## 2021-03-07 DIAGNOSIS — E782 Mixed hyperlipidemia: Secondary | ICD-10-CM | POA: Diagnosis present

## 2021-03-07 DIAGNOSIS — N1832 Chronic kidney disease, stage 3b: Secondary | ICD-10-CM

## 2021-03-07 DIAGNOSIS — E860 Dehydration: Secondary | ICD-10-CM

## 2021-03-07 DIAGNOSIS — N179 Acute kidney failure, unspecified: Secondary | ICD-10-CM | POA: Diagnosis present

## 2021-03-07 DIAGNOSIS — Z953 Presence of xenogenic heart valve: Secondary | ICD-10-CM

## 2021-03-07 DIAGNOSIS — Z952 Presence of prosthetic heart valve: Secondary | ICD-10-CM

## 2021-03-07 DIAGNOSIS — R197 Diarrhea, unspecified: Secondary | ICD-10-CM

## 2021-03-07 DIAGNOSIS — Z6834 Body mass index (BMI) 34.0-34.9, adult: Secondary | ICD-10-CM

## 2021-03-07 DIAGNOSIS — D696 Thrombocytopenia, unspecified: Secondary | ICD-10-CM | POA: Diagnosis present

## 2021-03-07 DIAGNOSIS — I519 Heart disease, unspecified: Secondary | ICD-10-CM

## 2021-03-07 DIAGNOSIS — Z79899 Other long term (current) drug therapy: Secondary | ICD-10-CM

## 2021-03-07 DIAGNOSIS — Z85828 Personal history of other malignant neoplasm of skin: Secondary | ICD-10-CM

## 2021-03-07 DIAGNOSIS — I4891 Unspecified atrial fibrillation: Secondary | ICD-10-CM | POA: Diagnosis present

## 2021-03-07 DIAGNOSIS — Z8616 Personal history of COVID-19: Secondary | ICD-10-CM

## 2021-03-07 DIAGNOSIS — I272 Pulmonary hypertension, unspecified: Secondary | ICD-10-CM | POA: Diagnosis present

## 2021-03-07 DIAGNOSIS — R112 Nausea with vomiting, unspecified: Principal | ICD-10-CM

## 2021-03-07 DIAGNOSIS — Z87891 Personal history of nicotine dependence: Secondary | ICD-10-CM

## 2021-03-07 DIAGNOSIS — Z9114 Patient's other noncompliance with medication regimen: Secondary | ICD-10-CM

## 2021-03-07 DIAGNOSIS — R0902 Hypoxemia: Secondary | ICD-10-CM | POA: Diagnosis present

## 2021-03-07 DIAGNOSIS — Z7984 Long term (current) use of oral hypoglycemic drugs: Secondary | ICD-10-CM

## 2021-03-07 DIAGNOSIS — A084 Viral intestinal infection, unspecified: Secondary | ICD-10-CM | POA: Diagnosis present

## 2021-03-07 DIAGNOSIS — J90 Pleural effusion, not elsewhere classified: Secondary | ICD-10-CM

## 2021-03-07 DIAGNOSIS — E119 Type 2 diabetes mellitus without complications: Secondary | ICD-10-CM

## 2021-03-07 DIAGNOSIS — E669 Obesity, unspecified: Secondary | ICD-10-CM | POA: Diagnosis present

## 2021-03-07 DIAGNOSIS — I5033 Acute on chronic diastolic (congestive) heart failure: Secondary | ICD-10-CM

## 2021-03-07 DIAGNOSIS — I4819 Other persistent atrial fibrillation: Secondary | ICD-10-CM | POA: Diagnosis present

## 2021-03-07 DIAGNOSIS — R001 Bradycardia, unspecified: Secondary | ICD-10-CM | POA: Diagnosis present

## 2021-03-07 DIAGNOSIS — E1122 Type 2 diabetes mellitus with diabetic chronic kidney disease: Secondary | ICD-10-CM | POA: Diagnosis present

## 2021-03-07 DIAGNOSIS — I2510 Atherosclerotic heart disease of native coronary artery without angina pectoris: Secondary | ICD-10-CM

## 2021-03-07 DIAGNOSIS — H9190 Unspecified hearing loss, unspecified ear: Secondary | ICD-10-CM | POA: Diagnosis present

## 2021-03-07 DIAGNOSIS — Z7952 Long term (current) use of systemic steroids: Secondary | ICD-10-CM

## 2021-03-07 DIAGNOSIS — N400 Enlarged prostate without lower urinary tract symptoms: Secondary | ICD-10-CM | POA: Diagnosis present

## 2021-03-07 DIAGNOSIS — I5021 Acute systolic (congestive) heart failure: Secondary | ICD-10-CM

## 2021-03-07 DIAGNOSIS — D631 Anemia in chronic kidney disease: Secondary | ICD-10-CM | POA: Diagnosis present

## 2021-03-07 DIAGNOSIS — R0600 Dyspnea, unspecified: Secondary | ICD-10-CM

## 2021-03-07 DIAGNOSIS — K8020 Calculus of gallbladder without cholecystitis without obstruction: Secondary | ICD-10-CM | POA: Diagnosis present

## 2021-03-07 DIAGNOSIS — R188 Other ascites: Secondary | ICD-10-CM | POA: Diagnosis present

## 2021-03-07 DIAGNOSIS — K529 Noninfective gastroenteritis and colitis, unspecified: Secondary | ICD-10-CM | POA: Diagnosis present

## 2021-03-07 DIAGNOSIS — I10 Essential (primary) hypertension: Secondary | ICD-10-CM

## 2021-03-07 DIAGNOSIS — I4510 Unspecified right bundle-branch block: Secondary | ICD-10-CM | POA: Diagnosis present

## 2021-03-07 DIAGNOSIS — Z7901 Long term (current) use of anticoagulants: Secondary | ICD-10-CM

## 2021-03-07 DIAGNOSIS — E876 Hypokalemia: Secondary | ICD-10-CM | POA: Diagnosis present

## 2021-03-07 DIAGNOSIS — Z20822 Contact with and (suspected) exposure to covid-19: Secondary | ICD-10-CM | POA: Diagnosis present

## 2021-03-07 LAB — CBC
HCT: 37.5 % — ABNORMAL LOW (ref 39.0–52.0)
Hemoglobin: 11.6 g/dL — ABNORMAL LOW (ref 13.0–17.0)
MCH: 28.6 pg (ref 26.0–34.0)
MCHC: 30.9 g/dL (ref 30.0–36.0)
MCV: 92.6 fL (ref 80.0–100.0)
Platelets: 157 10*3/uL (ref 150–400)
RBC: 4.05 MIL/uL — ABNORMAL LOW (ref 4.22–5.81)
RDW: 14.6 % (ref 11.5–15.5)
WBC: 4.7 10*3/uL (ref 4.0–10.5)
nRBC: 0 % (ref 0.0–0.2)

## 2021-03-07 LAB — COMPREHENSIVE METABOLIC PANEL
ALT: 12 U/L (ref 0–44)
AST: 27 U/L (ref 15–41)
Albumin: 3.9 g/dL (ref 3.5–5.0)
Alkaline Phosphatase: 66 U/L (ref 38–126)
Anion gap: 11 (ref 5–15)
BUN: 22 mg/dL (ref 8–23)
CO2: 27 mmol/L (ref 22–32)
Calcium: 9.2 mg/dL (ref 8.9–10.3)
Chloride: 101 mmol/L (ref 98–111)
Creatinine, Ser: 1.68 mg/dL — ABNORMAL HIGH (ref 0.61–1.24)
GFR, Estimated: 40 mL/min — ABNORMAL LOW (ref >60)
Glucose, Bld: 166 mg/dL — ABNORMAL HIGH (ref 70–99)
Potassium: 3.5 mmol/L (ref 3.5–5.1)
Sodium: 139 mmol/L (ref 135–145)
Total Bilirubin: 1.5 mg/dL — ABNORMAL HIGH (ref 0.3–1.2)
Total Protein: 7 g/dL (ref 6.5–8.1)

## 2021-03-07 LAB — TROPONIN I (HIGH SENSITIVITY): Troponin I (High Sensitivity): 41 ng/L — ABNORMAL HIGH (ref <18)

## 2021-03-07 LAB — LIPASE, BLOOD: Lipase: 27 U/L (ref 11–51)

## 2021-03-07 MED ORDER — SODIUM CHLORIDE 0.9 % IV BOLUS: 1000.0000 mL | Freq: Once | INTRAVENOUS | Status: DC

## 2021-03-07 MED ORDER — ONDANSETRON HCL 4 MG/2ML IJ SOLN
4.0000 mg | Freq: Once | INTRAMUSCULAR | Status: AC
  Administered 2021-03-08: 4 mg via INTRAVENOUS
  Filled 2021-03-07: qty 2

## 2021-03-07 MED ORDER — SODIUM CHLORIDE 0.9 % IV BOLUS
500.0000 mL | Freq: Once | INTRAVENOUS | Status: AC
  Administered 2021-03-08: 500 mL via INTRAVENOUS

## 2021-03-07 NOTE — ED Provider Notes (Signed)
[facility] Provider Note    Event Date/Time   First MD Initiated Contact with Patient 03/07/21 2313     (approximate)   History   Abdominal Pain, Emesis, and Diarrhea   HPI  Timothy Riley is a 81 y.o. male who presents to the ED from home with a chief complaint of nausea, vomiting and diarrhea x2 days.  Thinks he has food poisoning.  Had abdominal cramping which has since subsided.  Unable to tolerate his home medications which include Eliquis and antihypertensives.  Patient has a past medical history of CKD, diabetes, hypertension, CAD, atrial fibrillation.  Denies fever, chills, cough, chest pain, shortness of breath, dysuria.  Notes increased BLE swelling secondary to not being able to keep down his furosemide.     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 Glower MD @ [facility] via R thoracic approach; b. 03/2018 Echo: EF 60-65%, DD. Mildly dil LA. Nl fxn AoV prosthesis. Grad 5mmHg.   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 12/2013 Cath ([facility]): LAD 30p, RI 60, otw nl (per CT surgery note from [place]); b. 03/2018 MV: EF 59%, no ischemia/scar.   Persistent atrial fibrillation ([place]) 10/2017   a. CHA2DS2VASc = 5-->Eliquis; b. 12/2017 loaded w/amio-->DCCV.   PSVT (paroxysmal supraventricular tachycardia) ([place])    a. 05/2014 s/p RFCA @ [facility].   Right inguinal hernia    Urinary incontinence       Active Problem List   Patient Active Problem List   Diagnosis Date Noted   Chronic anticoagulation    Pleural effusion, bilateral    Acute on chronic diastolic CHF (congestive heart failure) (HCC)    Acute gastroenteritis    Allergic rhinitis 09/14/2019   Localized swelling of both lower extremities 05/21/2019   Class 1 obesity due to excess calories with serious comorbidity in adult 05/21/2019   Senile purpura ([place]) 12/12/2018   AKI (acute kidney injury) ([place]) 12/12/2018   Anemia 12/12/2018   Controlled type 2 diabetes mellitus without complication, without long-term current use of insulin ([place]) 11/05/2017   Arthritis of both feet 11/05/2017   H/O colonoscopy with polypectomy 08/27/2014   S/P AVR (aortic valve replacement) 08/24/2014   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   Hypertension goal BP (blood pressure) < 140/90 08/24/2014   Stage 3b chronic kidney disease ([place]) 06/17/2014   Atrial fibrillation ([place]) 06/17/2014   H/O paroxysmal supraventricular tachycardia 05/31/2014   SVT (supraventricular tachycardia) ([place]) 05/29/2014   CAD (coronary artery disease) 01/11/2014   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 12/01/2013   Mixed hyperlipidemia 12/01/2013     Past Surgical History   Past Surgical History:  Procedure Laterality Date   AORTIC VALVE REPLACEMENT     CARDIAC CATHETERIZATION     CARDIOVERSION N/A 01/01/2018   Procedure: CARDIOVERSION;  Surgeon: Minna Merritts, MD;  Location:  [facility] ORS;  Service: Cardiovascular;  Laterality: N/A;   COLONOSCOPY  2010   heart valve replacment  2015   XI ROBOTIC ASSISTED INGUINAL HERNIA REPAIR WITH MESH Right 01/07/2019   Procedure: XI ROBOTIC ASSISTED INGUINAL HERNIA REPAIR WITH MESH;  Surgeon: Ronny Bacon, MD;  Location: [facility] ORS;  Service: General;  Laterality: Right;     Home Medications   Prior to Admission medications   Medication Sig Start Date End Date Taking?  Authorizing Provider  amoxicillin (AMOXIL) 500 MG capsule TAKE 4 CAPSULES 1 HOUR BEFORE DENTAL APPOINTMENT 04/28/20   Minna Merritts, MD  Cholecalciferol (VITAMIN D3 PO) Take by mouth daily.    [provider]  doxazosin (CARDURA) 8 MG tablet Take 1 tablet (8 mg total) by mouth 2 (two) times daily. 05/23/20   Minna Merritts, MD  ELIQUIS 2.5 MG TABS tablet TAKE 1 TABLET BY MOUTH TWICE A DAY 09/20/20   Minna Merritts, MD  furosemide (LASIX) 20 MG tablet Take 1 tablet (20 mg total) by mouth daily. 02/22/21 05/23/21  Furth, Cadence H, PA-C  glipiZIDE (GLUCOTROL) 10 MG tablet TAKE 1 TABLET BY MOUTH TWICE A DAY BEFORE A MEAL. 01/28/20   Towanda Malkin, MD  metoprolol succinate (TOPROL-XL) 25 MG 24 hr tablet Take 1 tablet (25 mg total) by mouth daily. 07/05/20   Minna Merritts, MD  pioglitazone (ACTOS) 30 MG tablet TAKE 1 TABLET BY MOUTH ONCE DAILY 01/20/20   Towanda Malkin, MD  predniSONE (DELTASONE) 10 MG tablet Take 10 mg by mouth daily with breakfast.    [provider]  rosuvastatin (CRESTOR) 10 MG tablet Take 1 tablet (10 mg total) by mouth daily. 06/29/20   Steele Sizer, MD     Allergies  Gabapentin, Hydralazine, and Hydralazine hcl   Family History   Family History  Problem Relation Age of Onset   Cancer Mother    Stroke Father      Physical Exam  Triage Vital Signs: ED Triage Vitals  Enc Vitals Group     BP 03/07/21 2202 (!) 187/97     Pulse Rate 03/07/21 2202 88     Resp 03/07/21 2202 20     Temp 03/07/21 2202 98.5 F (36.9 C)     Temp Source 03/07/21 2202 Oral     SpO2 03/07/21 2202 100 %     Weight 03/07/21 2203 219 lb (99.3 kg)     Height 03/07/21 2203 5\' 5"  (1.651 m)     Head Circumference --      Peak Flow --      Pain Score 03/07/21 2202 8     Pain Loc --      Pain Edu? --      Excl. in [place]? --     Updated Vital Signs: BP (!) 160/66    Pulse 86    Temp 98.5 F (36.9 C) (Oral)    Resp 16    Ht 5\' 5"  (1.651 m)    Wt  99.3 kg    SpO2 97%    BMI 36.44 kg/m    General: Awake, mild distress.  CV:  RRR. Good peripheral perfusion.  Resp:  Normal effort.  CTAB. Abd:  Generalized soreness to palpation, no localized pain.  No abdominal bruits.  No distention.  Other:  1+ BLE pitting edema.  Mildly dry mucous membranes.   ED Results / Procedures / Treatments  Labs (all labs ordered are listed, but only abnormal results are displayed) Labs  Reviewed  COMPREHENSIVE METABOLIC PANEL - Abnormal; Notable for the following components:      Result Value   Glucose, Bld 166 (*)    Creatinine, Ser 1.68 (*)    Total Bilirubin 1.5 (*)    GFR, Estimated 40 (*)    All other components within normal limits  CBC - Abnormal; Notable for the following components:   RBC 4.05 (*)    Hemoglobin 11.6 (*)    HCT 37.5 (*)    All other components within normal limits  URINALYSIS, ROUTINE W REFLEX MICROSCOPIC - Abnormal; Notable for the following components:   Color, Urine YELLOW (*)    APPearance HAZY (*)    Hgb urine dipstick MODERATE (*)    Ketones, ur 20 (*)    Protein, ur >=300 (*)    All other components within normal limits  TROPONIN I (HIGH SENSITIVITY) - Abnormal; Notable for the following components:   Troponin I (High Sensitivity) 41 (*)    All other components within normal limits  RESP PANEL BY RT-PCR (FLU A&B, COVID) ARPGX2  CULTURE, BLOOD (ROUTINE X 2)  CULTURE, BLOOD (ROUTINE X 2)  URINE CULTURE  GASTROINTESTINAL PANEL BY PCR, STOOL (REPLACES STOOL CULTURE)  LIPASE, BLOOD  LACTIC ACID, PLASMA  LACTIC ACID, PLASMA  HEMOGLOBIN A1C     EKG  ED ECG REPORT I, Deara Bober J, the attending physician, personally viewed and interpreted this ECG.   Date: 03/08/2021  EKG Time: 2205  Rate: 100  Rhythm: sinus tachycardia  Axis: Normal  Intervals:right bundle branch block  ST&T Change: Nonspecific    RADIOLOGY I have personally reviewed patient's CT abdomen pelvis as well as the radiology  interpretation:  CT abdomen pelvis: Bilateral pleural effusions, small volume abdominal/pelvic ascites, cholelithiasis without cholecystitis  Official radiology report(s): CT Abdomen Pelvis Wo Contrast  Result Date: 03/07/2021 CLINICAL DATA:  Nausea, vomiting, and diarrhea. EXAM: CT ABDOMEN AND PELVIS WITHOUT CONTRAST TECHNIQUE: Multidetector CT imaging of the abdomen and pelvis was performed following the standard protocol without IV contrast. RADIATION DOSE REDUCTION: This exam was performed according to the departmental dose-optimization program which includes automated exposure control, adjustment of the mA and/or kV according to patient size and/or use of iterative reconstruction technique. COMPARISON:  None. FINDINGS: Lower chest: Moderate bilateral pleural effusions with basilar atelectasis. Mild cardiac enlargement. Hepatobiliary: Cholelithiasis with distended gallbladder. No wall thickening or inflammatory stranding. No focal liver lesions. Pancreas: Unremarkable. No pancreatic ductal dilatation or surrounding inflammatory changes. Spleen: Mild splenic enlargement.  Calcified granuloma. Adrenals/Urinary Tract: No adrenal gland nodules. No renal or ureteral stone or obstruction. Mild stranding around the kidneys likely due to edema. Bladder wall is diffusely thickened, suggesting cystitis. Correlate with urinalysis. Stomach/Bowel: Stomach, small bowel, and colon are not abnormally distended. No wall thickening or inflammatory changes are appreciated. Scattered colonic diverticula without evidence of acute diverticulitis. Appendix is normal. Vascular/Lymphatic: Aortic atherosclerosis. No enlarged abdominal or pelvic lymph nodes. Reproductive: Prostate is unremarkable. Other: Small amount of free fluid in the upper abdomen and along the pericolic gutters. No free air. Minimal periumbilical hernia containing fat. Musculoskeletal: Degenerative changes in the spine. IMPRESSION: 1. Bilateral pleural  effusions with basilar atelectasis. 2. Small volume abdominal and pelvic ascites with edema around the kidneys. No loculated collections. 3. No bowel obstruction or inflammation. 4. Cholelithiasis without evidence of acute cholecystitis. 5. Mild splenic enlargement. 6. Aortic atherosclerosis. Electronically Signed   By: Lucienne Capers M.D.   On: 03/07/2021 23:33     PROCEDURES:  Critical Care  performed: No  .1-3 Lead EKG Interpretation Performed by: Paulette Blanch, MD Authorized by: Paulette Blanch, MD     Interpretation: normal     ECG rate:  85   ECG rate assessment: normal     Rhythm: sinus rhythm     Ectopy: none     Conduction: normal   Comments:     Patient placed on cardiac monitor to evaluate for arrhythmias   MEDICATIONS ORDERED IN ED: Medications  enoxaparin (LOVENOX) injection 50 mg (has no administration in time range)  acetaminophen (TYLENOL) tablet 650 mg (has no administration in time range)    Or  acetaminophen (TYLENOL) suppository 650 mg (has no administration in time range)  ondansetron (ZOFRAN) tablet 4 mg (has no administration in time range)    Or  ondansetron (ZOFRAN) injection 4 mg (has no administration in time range)  insulin aspart (novoLOG) injection 0-15 Units (has no administration in time range)  0.9 %  sodium chloride infusion (has no administration in time range)  metoprolol tartrate (LOPRESSOR) injection 5 mg (has no administration in time range)  pantoprazole (PROTONIX) injection 40 mg (40 mg Intravenous Given 03/08/21 0311)  ondansetron (ZOFRAN) injection 4 mg (4 mg Intravenous Given 03/08/21 0028)  sodium chloride 0.9 % bolus 500 mL (0 mLs Intravenous Stopped 03/08/21 0200)     IMPRESSION / MDM / ASSESSMENT AND PLAN / ED COURSE  I reviewed the triage vital signs and the nursing notes.                             81 year old male presenting with abdominal pain, N/V/D. Differential diagnosis includes, but is not limited to, acute appendicitis,  renal colic, testicular torsion, urinary tract infection/pyelonephritis, prostatitis,  epididymitis, diverticulitis, small bowel obstruction or ileus, colitis, abdominal aortic aneurysm, gastroenteritis, hernia, etc. I have personally reviewed patient's records and see a telephone encounter with his PCP from yesterday regarding his symptoms.  The patient is on the cardiac monitor to evaluate for evidence of arrhythmia and/or significant heart rate changes.  Laboratory results include normal WBC 4.7, elevated creatinine 1.68 just mildly elevated from 1 week ago, mildly elevated troponin, likely demand ischemia.  CT demonstrates cholelithiasis without cholecystitis, small volume ascites, diffusely thickened bladder wall ?cystitis.  Will administer judicious IV fluids, IV Zofran for nausea.  Awaiting urinalysis.  Will reassess.  Clinical Course as of 03/08/21 0320  Wed Mar 08, 2021  0055 Updated patient and son on CT imaging results demonstrating cholelithiasis, bladder wall thickening, mild abdominal/pelvic fluid.  Awaiting urine specimen.  Son tells me that patient has been confused/altered for the past couple of days.  Of note, room air saturations 86%; patient placed on 2 L nasal cannula oxygen with improvement to 95%. [JS]    Clinical Course User Index [JS] Paulette Blanch, MD     FINAL CLINICAL IMPRESSION(S) / ED DIAGNOSES   Final diagnoses:  Nausea vomiting and diarrhea  Dehydration  Calculus of gallbladder without cholecystitis without obstruction  Pleural effusion  Hypoxia     Rx / DC Orders   ED Discharge Orders     None        Note:  This document was prepared using Dragon voice recognition software and may include unintentional dictation errors.   Paulette Blanch, MD 03/08/21 [phone]

## 2021-03-07 NOTE — ED Triage Notes (Signed)
Pt arrived via POV with reports of 2 of diarrhea, vomiting and abdominal pain. Poor PO intake, unable to take meds, takes eliquis and HTN meds, unable to keep liquids down.  Pt ambulatory on arrival.

## 2021-03-08 DIAGNOSIS — K529 Noninfective gastroenteritis and colitis, unspecified: Secondary | ICD-10-CM | POA: Diagnosis not present

## 2021-03-08 DIAGNOSIS — I5033 Acute on chronic diastolic (congestive) heart failure: Secondary | ICD-10-CM

## 2021-03-08 DIAGNOSIS — I5021 Acute systolic (congestive) heart failure: Secondary | ICD-10-CM

## 2021-03-08 DIAGNOSIS — Z7901 Long term (current) use of anticoagulants: Secondary | ICD-10-CM

## 2021-03-08 DIAGNOSIS — J90 Pleural effusion, not elsewhere classified: Secondary | ICD-10-CM

## 2021-03-08 LAB — URINALYSIS, ROUTINE W REFLEX MICROSCOPIC
Bacteria, UA: NONE SEEN
Bilirubin Urine: NEGATIVE
Glucose, UA: NEGATIVE mg/dL
Ketones, ur: 20 mg/dL — AB
Leukocytes,Ua: NEGATIVE
Nitrite: NEGATIVE
Protein, ur: >=300 mg/dL — AB
Specific Gravity, Urine: 1.022 (ref 1.005–1.030)
pH: 5 (ref 5.0–8.0)

## 2021-03-08 LAB — GLUCOSE, CAPILLARY
Glucose-Capillary: 106 mg/dL — ABNORMAL HIGH (ref 70–99)
Glucose-Capillary: 111 mg/dL — ABNORMAL HIGH (ref 70–99)
Glucose-Capillary: 124 mg/dL — ABNORMAL HIGH (ref 70–99)
Glucose-Capillary: 125 mg/dL — ABNORMAL HIGH (ref 70–99)
Glucose-Capillary: 96 mg/dL (ref 70–99)

## 2021-03-08 LAB — RESP PANEL BY RT-PCR (FLU A&B, COVID) ARPGX2
Influenza A by PCR: NEGATIVE
Influenza B by PCR: NEGATIVE
SARS Coronavirus 2 by RT PCR: NEGATIVE

## 2021-03-08 LAB — LACTIC ACID, PLASMA
Lactic Acid, Venous: 1.1 mmol/L (ref 0.5–1.9)
Lactic Acid, Venous: 0.8 mmol/L (ref 0.5–1.9)

## 2021-03-08 LAB — CBG MONITORING, ED: Glucose-Capillary: 114 mg/dL — ABNORMAL HIGH (ref 70–99)

## 2021-03-08 LAB — HEMOGLOBIN A1C
Hgb A1c MFr Bld: 5.4 % (ref 4.8–5.6)
Mean Plasma Glucose: 108.28 mg/dL

## 2021-03-08 MED ORDER — METOPROLOL SUCCINATE ER 25 MG PO TB24
25.0000 mg | ORAL_TABLET | Freq: Every day | ORAL | Status: DC
  Administered 2021-03-08 – 2021-03-13 (×5): 25 mg via ORAL
  Filled 2021-03-08 (×6): qty 1

## 2021-03-08 MED ORDER — ENOXAPARIN SODIUM 60 MG/0.6ML IJ SOSY
0.5000 mg/kg | PREFILLED_SYRINGE | INTRAMUSCULAR | Status: DC
  Administered 2021-03-08: 50 mg via SUBCUTANEOUS
  Filled 2021-03-08: qty 0.6

## 2021-03-08 MED ORDER — PANTOPRAZOLE SODIUM 40 MG IV SOLR
40.0000 mg | INTRAVENOUS | Status: DC
  Administered 2021-03-08 – 2021-03-12 (×5): 40 mg via INTRAVENOUS
  Filled 2021-03-08 (×5): qty 10

## 2021-03-08 MED ORDER — ONDANSETRON HCL 4 MG/2ML IJ SOLN: 4.0000 mg | Freq: Four times a day (QID) | INTRAMUSCULAR | Status: DC | PRN

## 2021-03-08 MED ORDER — ACETAMINOPHEN 325 MG PO TABS: 650.0000 mg | ORAL_TABLET | Freq: Four times a day (QID) | ORAL | Status: DC | PRN

## 2021-03-08 MED ORDER — SODIUM CHLORIDE 0.9 % IV SOLN: INTRAVENOUS | Status: DC

## 2021-03-08 MED ORDER — FUROSEMIDE 20 MG PO TABS
20.0000 mg | ORAL_TABLET | Freq: Every day | ORAL | Status: DC
  Administered 2021-03-08: 20 mg via ORAL
  Filled 2021-03-08: qty 1

## 2021-03-08 MED ORDER — APIXABAN 2.5 MG PO TABS
2.5000 mg | ORAL_TABLET | Freq: Two times a day (BID) | ORAL | Status: DC
  Administered 2021-03-08 – 2021-03-15 (×15): 2.5 mg via ORAL
  Filled 2021-03-08 (×16): qty 1

## 2021-03-08 MED ORDER — ACETAMINOPHEN 650 MG RE SUPP: 650.0000 mg | Freq: Four times a day (QID) | RECTAL | Status: DC | PRN

## 2021-03-08 MED ORDER — METOPROLOL TARTRATE 5 MG/5ML IV SOLN
5.0000 mg | Freq: Four times a day (QID) | INTRAVENOUS | Status: AC | PRN
  Administered 2021-03-08: 5 mg via INTRAVENOUS
  Filled 2021-03-08: qty 5

## 2021-03-08 MED ORDER — ROSUVASTATIN CALCIUM 10 MG PO TABS
10.0000 mg | ORAL_TABLET | Freq: Every day | ORAL | Status: DC
  Administered 2021-03-09 – 2021-03-15 (×7): 10 mg via ORAL
  Filled 2021-03-08 (×8): qty 1

## 2021-03-08 MED ORDER — ONDANSETRON HCL 4 MG PO TABS: 4.0000 mg | ORAL_TABLET | Freq: Four times a day (QID) | ORAL | Status: DC | PRN

## 2021-03-08 MED ORDER — INSULIN ASPART 100 UNIT/ML IJ SOLN
0.0000 [IU] | INTRAMUSCULAR | Status: DC
  Administered 2021-03-08 – 2021-03-09 (×3): 2 [IU] via SUBCUTANEOUS
  Administered 2021-03-09 – 2021-03-10 (×3): 3 [IU] via SUBCUTANEOUS
  Administered 2021-03-11: 2 [IU] via SUBCUTANEOUS
  Administered 2021-03-11 – 2021-03-12 (×2): 3 [IU] via SUBCUTANEOUS
  Administered 2021-03-12 – 2021-03-13 (×3): 2 [IU] via SUBCUTANEOUS
  Administered 2021-03-13 – 2021-03-14 (×4): 3 [IU] via SUBCUTANEOUS
  Filled 2021-03-08 (×16): qty 1

## 2021-03-08 MED ORDER — SODIUM CHLORIDE 0.9 % IV SOLN: INTRAVENOUS | Status: AC

## 2021-03-08 MED ORDER — ADULT MULTIVITAMIN W/MINERALS CH
1.0000 | ORAL_TABLET | Freq: Every day | ORAL | Status: DC
  Administered 2021-03-08 – 2021-03-15 (×8): 1 via ORAL
  Filled 2021-03-08 (×8): qty 1

## 2021-03-08 MED ORDER — ENSURE ENLIVE PO LIQD
237.0000 mL | Freq: Three times a day (TID) | ORAL | Status: DC
  Administered 2021-03-08 – 2021-03-14 (×10): 237 mL via ORAL

## 2021-03-08 MED ORDER — DOXAZOSIN MESYLATE 4 MG PO TABS
8.0000 mg | ORAL_TABLET | Freq: Two times a day (BID) | ORAL | Status: DC
  Administered 2021-03-08 – 2021-03-15 (×14): 8 mg via ORAL
  Filled 2021-03-08 (×12): qty 2
  Filled 2021-03-08: qty 1
  Filled 2021-03-08 (×3): qty 2

## 2021-03-08 NOTE — Assessment & Plan Note (Signed)
Suspect viral IV antiemetics, IV Protonix Gentle IV hydration while monitoring for worsening fluid overload Follow GI panel

## 2021-03-08 NOTE — ED Notes (Signed)
Pt updated on need for urine sample. Pt denies any need to go at this time. Will let me know when they are able to do so.  °

## 2021-03-08 NOTE — Assessment & Plan Note (Signed)
Rate controlled Hold Eliquis due to intractable vomiting Resume Eliquis and metoprolol when tolerating orally

## 2021-03-08 NOTE — Assessment & Plan Note (Signed)
Suspect viral gastroenteritis IV antiemetics, IV Protonix, gentle IV hydration GI panel

## 2021-03-08 NOTE — H&P (Signed)
History and Physical    Patient: Timothy Riley OAC:[id number] DOB: [birth year] DOA: 03/07/2021 DOS: the patient was seen and examined on 03/08/2021 PCP: Derinda Late, MD  Patient coming from: Home  Chief Complaint:  Chief Complaint  Patient presents with   Abdominal Pain   Emesis   Diarrhea    HPI: Timothy Riley is a 81 y.o. male with medical history significant of Persistent A-fib on Eliquis, s/p bioprosthetic AVR, G2 DD (EF 50 to 55% 08/2020), diabetes, nonobstructive CAD with low risk Myoview in 2020, HTN, CKD 3B, who presents to the ED with a 2-day history of vomiting diarrhea and abdominal pain, poor oral intake with inability to hold down anything, including meds.  Vomiting is nonbloody and nonbilious.  He has no affected contacts.  Denies fever or chills.  Abdominal pain is crampy and has since subsided.  Since onset of symptoms he has noted increased lower extremity edema but denies chest pain or shortness of breath.  ED course: On arrival, afebrile with BP 187/97 and otherwise normal vitals Blood work: WBC 4.7 with lactic acid 1.1 Hemoglobin at baseline at 11.6 Creatinine at baseline at 1.68 Lipase and LFTs unremarkable except for mildly elevated total bilirubin of 1.5 Troponin 41 Urinalysis with 20 ketones and greater than 300 protein but negative nitrite leukocyte esterases and bacteria  EKG, personally viewed and interpreted: Sinus tachycardia at 100 with RBBB  Imaging: CT abdomen and pelvis with bilateral pleural effusions, small volume abdominal and pelvic ascites with edema around the kidneys, no bowel obstruction, cholelithiasis without acute cholecystitis and mild splenic enlargement  Patient treated with Zofran and gentle IV bolus of NS.  Hospitalist consulted for admission.   Review of Systems: As mentioned in the history of present illness. All other systems reviewed and are negative. 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 Glower MD @ [facility] via R thoracic approach; b. 03/2018 Echo: EF 60-65%, DD. Mildly dil LA. Nl fxn AoV prosthesis. Grad 54mmHg.   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 12/2013 Cath ([facility]): LAD 30p, RI 60, otw nl (per CT surgery note from [place]); b. 03/2018 MV: EF 59%, no ischemia/scar.   Persistent atrial fibrillation ([place]) 10/2017   a. CHA2DS2VASc = 5-->Eliquis; b. 12/2017 loaded w/amio-->DCCV.   PSVT (paroxysmal supraventricular tachycardia) ([place])    a. 05/2014 s/p RFCA @ [facility].   Right inguinal hernia    Urinary incontinence    Past Surgical History:  Procedure Laterality Date   AORTIC VALVE REPLACEMENT     CARDIAC CATHETERIZATION     CARDIOVERSION N/A 01/01/2018   Procedure: CARDIOVERSION;  Surgeon: Minna Merritts, MD;  Location: [facility] ORS;  Service: Cardiovascular;  Laterality: N/A;   COLONOSCOPY  2010   heart valve replacment  2015   XI ROBOTIC ASSISTED INGUINAL HERNIA REPAIR WITH MESH Right 01/07/2019   Procedure: XI ROBOTIC ASSISTED INGUINAL HERNIA REPAIR WITH MESH;  Surgeon: Ronny Bacon, MD;  Location: [facility] ORS;  Service: General;  Laterality: Right;   Social History:  reports that he quit smoking about 49 years ago. His smoking use included cigarettes. He has a 0.50 pack-year smoking history. He has never used smokeless tobacco. He reports that he does not drink alcohol and does not use drugs.  Allergies  Allergen Reactions   Gabapentin Other (See Comments)  and Rash    dizzy dizzy   Hydralazine Anxiety and Rash   Hydralazine Hcl Rash    Family History  Problem Relation Age of Onset   Cancer Mother    Stroke Father     Prior to Admission medications   Medication Sig Start Date End Date Taking? Authorizing Provider  amoxicillin (AMOXIL) 500 MG capsule TAKE 4 CAPSULES 1 HOUR BEFORE DENTAL APPOINTMENT 04/28/20   Minna Merritts, MD  Cholecalciferol (VITAMIN D3 PO) Take by mouth daily.    [provider]  doxazosin (CARDURA) 8 MG tablet Take 1 tablet (8 mg total) by mouth 2 (two) times daily. 05/23/20   Minna Merritts, MD  ELIQUIS 2.5 MG TABS tablet TAKE 1 TABLET BY MOUTH TWICE A DAY 09/20/20   Minna Merritts, MD  furosemide (LASIX) 20 MG tablet Take 1 tablet (20 mg total) by mouth daily. 02/22/21 05/23/21  Furth, Cadence H, PA-C  glipiZIDE (GLUCOTROL) 10 MG tablet TAKE 1 TABLET BY MOUTH TWICE A DAY BEFORE A MEAL. 01/28/20   Towanda Malkin, MD  metoprolol succinate (TOPROL-XL) 25 MG 24 hr tablet Take 1 tablet (25 mg total) by mouth daily. 07/05/20   Minna Merritts, MD  pioglitazone (ACTOS) 30 MG tablet TAKE 1 TABLET BY MOUTH ONCE DAILY 01/20/20   Towanda Malkin, MD  predniSONE (DELTASONE) 10 MG tablet Take 10 mg by mouth daily with breakfast.    [provider]  rosuvastatin (CRESTOR) 10 MG tablet Take 1 tablet (10 mg total) by mouth daily. 06/29/20   Steele Sizer, MD    Physical Exam: Vitals:   03/08/21 0009 03/08/21 0040 03/08/21 0100 03/08/21 0130  BP: (!) 154/119  (!) 159/72 (!) 160/66  Pulse: 88  86 86  Resp:   16 16  Temp:      TempSrc:      SpO2:  95% 94% 97%  Weight:      Height:       Physical Exam Vitals and nursing note reviewed.  Constitutional:      General: He is not in acute distress.    Appearance: Normal appearance.  HENT:     Head: Normocephalic and atraumatic.  Cardiovascular:     Rate and Rhythm: Normal rate and regular rhythm.     Pulses: Normal pulses.     Heart  sounds: Normal heart sounds. No murmur heard. Pulmonary:     Effort: Pulmonary effort is normal.     Breath sounds: Normal breath sounds. No wheezing or rhonchi.  Abdominal:     General: Bowel sounds are normal.     Palpations: Abdomen is soft.     Tenderness: There is no abdominal tenderness.  Musculoskeletal:        General: No swelling or tenderness. Normal range of motion.     Cervical back: Normal range of motion and neck supple.  Skin:    General: Skin is warm and dry.  Neurological:     General: No focal deficit present.     Mental Status: He is alert. Mental status is at  baseline.  Psychiatric:        Mood and Affect: Mood normal.        Behavior: Behavior normal.     Data Reviewed: Notes from primary care and specialist visits, past discharge summaries. Prior diagnostic testing as applicable to current admission diagnoses Updated medications and problem lists for reconciliation ED course, including vitals, labs, imaging, treatment and response to treatment Triage notes and ED providers notes   Assessment and Plan: Acute gastroenteritis Suspect viral IV antiemetics, IV Protonix Gentle IV hydration while monitoring for worsening fluid overload Follow GI panel  Acute on chronic diastolic CHF (congestive heart failure) (HCC) Mild exacerbation related to not being able to hold on meds IV metoprolol as needed until able to tolerate orally Will hold  furosemide for now Keep legs elevated  Pleural effusion, bilateral Secondary to mild CHF exacerbation Patient not overtly symptomatic Hold off on thoracentesis  Chronic anticoagulation Holding Eliquis due to poor oral tolerance  Atrial fibrillation ([place])- (present on admission) Rate controlled Hold Eliquis due to intractable vomiting Resume Eliquis and metoprolol when tolerating orally  Controlled type 2 diabetes mellitus without complication, without long-term current use of insulin (HCC) Sliding scale insulin  coverage  CAD (coronary artery disease) To resume metoprolol and rosuvastatin once tolerating orally  Stage 3b chronic kidney disease ([place]) Renal function at baseline  S/P AVR (aortic valve replacement) No acute issues suspected    Advance Care Planning:   Code Status: Prior none  Consults: none  Family Communication: none  Severity of Illness: The appropriate patient status for this patient is OBSERVATION. Observation status is judged to be reasonable and necessary in order to provide the required intensity of service to ensure the patient's safety. The patient's presenting symptoms, physical exam findings, and initial radiographic and laboratory data in the context of their medical condition is felt to place them at decreased risk for further clinical deterioration. Furthermore, it is anticipated that the patient will be medically stable for discharge from the hospital within 2 midnights of admission.   Author: Athena Masse, MD 03/08/2021 2:34 AM  For on call review [email or web address].

## 2021-03-08 NOTE — Plan of Care (Signed)
Pt has not had any  bm since admission to unit. At this time unable to complete gi panel. Hat placed in commode and specimen and labels at bedside.  Problem: Education: Goal: Knowledge of General Education information will improve Description: Including pain rating scale, medication(s)/side effects and non-pharmacologic comfort measures Outcome: Progressing   Problem: Health Behavior/Discharge Planning: Goal: Ability to manage health-related needs will improve Outcome: Progressing   Problem: Clinical Measurements: Goal: Ability to maintain clinical measurements within normal limits will improve Outcome: Progressing Goal: Will remain free from infection Outcome: Progressing Goal: Diagnostic test results will improve Outcome: Progressing Goal: Respiratory complications will improve Outcome: Progressing Goal: Cardiovascular complication will be avoided Outcome: Progressing   Problem: Activity: Goal: Risk for activity intolerance will decrease Outcome: Progressing   Problem: Nutrition: Goal: Adequate nutrition will be maintained Outcome: Progressing   Problem: Elimination: Goal: Will not experience complications related to bowel motility Outcome: Progressing Goal: Will not experience complications related to urinary retention Outcome: Progressing   Problem: Pain Managment: Goal: General experience of comfort will improve Outcome: Progressing   Problem: Safety: Goal: Ability to remain free from injury will improve Outcome: Progressing   Problem: Skin Integrity: Goal: Risk for impaired skin integrity will decrease Outcome: Progressing

## 2021-03-08 NOTE — Progress Notes (Signed)
PHARMACIST - PHYSICIAN COMMUNICATION  CONCERNING:  Enoxaparin (Lovenox) for DVT Prophylaxis    RECOMMENDATION: Patient was prescribed enoxaprin 40mg  q24 hours for VTE prophylaxis.   Filed Weights   03/07/21 2203  Weight: 99.3 kg (219 lb)    Body mass index is 36.44 kg/m.  Estimated Creatinine Clearance: 36.7 mL/min (A) (by C-G formula based on SCr of 1.68 mg/dL (H)).   Based on [facility] patient is candidate for enoxaparin 0.5mg /kg TBW SQ every 24 hours based on BMI being >30.  DESCRIPTION: Pharmacy has adjusted enoxaparin dose per [facility] policy.  Patient is now receiving enoxaparin 0.5 mg/kg every 24 hours   Renda Rolls, PharmD, [facility] 03/08/2021 2:56 AM

## 2021-03-08 NOTE — Progress Notes (Signed)
PRN metoprolol and effective.   03/08/21 0357 03/08/21 0400 03/08/21 0520  Assess: MEWS Score  Temp 98 F (36.7 C)  --   --   BP (!) 183/74  --  (!) 172/78  Pulse Rate 82  --  78  Resp 16  --   --   Level of Consciousness  --  Alert  --   SpO2 97 %  --   --   O2 Device Nasal Cannula  --   --     03/08/21 0644  Assess: MEWS Score  Temp  --   BP (!) 156/73  Pulse Rate 68  Resp  --   Level of Consciousness  --   SpO2  --   O2 Device  --

## 2021-03-08 NOTE — Progress Notes (Signed)
Patient admitted early this morning for abdominal pain, vomiting and diarrhea, suspected acute viral gastroenteritis and started on IV fluids.  Patient and examined at bedside.  He denies any diarrhea since admission and currently denies worsening abdominal pain.  Plan of care discussed with him.  I have reviewed patient's medical records including this morning's H&P, current vitals, labs, and medication management.  Continue IV fluids.  Stool testing pending.  Advance diet to soft diet.  Repeat a.m. labs.

## 2021-03-08 NOTE — Assessment & Plan Note (Signed)
To resume metoprolol and rosuvastatin once tolerating orally

## 2021-03-08 NOTE — Assessment & Plan Note (Signed)
Sliding scale insulin coverage 

## 2021-03-08 NOTE — Assessment & Plan Note (Signed)
Renal function at baseline 

## 2021-03-08 NOTE — Evaluation (Signed)
Physical Therapy Evaluation Patient Details Name: Timothy Riley MRN: [id number] DOB: Jun 04, 1940 Today's Date: 03/08/2021  History of Present Illness  LEIBISH MCGREGOR is a 81 y.o. male with medical history significant of Persistent A-fib on Eliquis, s/p bioprosthetic AVR, G2 DD (EF 50 to 55% 08/2020), diabetes, nonobstructive CAD with low risk Myoview in 2020, HTN, CKD 3B, who presents to the ED with a 2-day history of vomiting diarrhea and abdominal pain, poor oral intake with inability to hold down anything, including meds.   Clinical Impression  Patient received sleeping in bed, required increased time to rouse enough to participate. Patient groggy throughout session. Difficulty follow directions, hoh. He is mod independent with bed mobility, transfers with min guard and ambulated with min guard 25 feet. Patient unsteady this session, requiring single UE support. He will continue to benefit from skilled PT while here to improve strength and functional independence to return home safely.           Recommendations for follow up therapy are one component of a multi-disciplinary discharge planning process, led by the attending physician.  Recommendations may be updated based on patient status, additional functional criteria and insurance authorization.  Follow Up Recommendations Home health PT    Assistance Recommended at Discharge Frequent or constant Supervision/Assistance  Patient can return home with the following  A little help with walking and/or transfers;A little help with bathing/dressing/bathroom;Assistance with cooking/housework;Assist for transportation;Help with stairs or ramp for entrance    Equipment Recommendations None recommended by PT;Other (comment) (TBD)  Recommendations for Other Services       Functional Status Assessment Patient has had a recent decline in their functional status and demonstrates the ability to make significant improvements in function in a reasonable  and predictable amount of time.     Precautions / Restrictions Precautions Precautions: Fall Restrictions Weight Bearing Restrictions: No      Mobility  Bed Mobility Overal bed mobility: Modified Independent             General bed mobility comments: use of bed rails, increased time and effort    Transfers Overall transfer level: Needs assistance Equipment used: None Transfers: Sit to/from Stand Sit to Stand: Min guard                Ambulation/Gait     Assistive device: None Gait Pattern/deviations: Step-through pattern, Decreased step length - right, Decreased step length - left, Staggering right, Staggering left Gait velocity: decr     General Gait Details: patient reaching out for furniture, counter, walls during ambulation in room. Unsteady, but unsure if just due to grogginess this am.  Stairs            Wheelchair Mobility    Modified Rankin (Stroke Patients Only)       Balance Overall balance assessment: Needs assistance Sitting-balance support: Feet supported Sitting balance-Leahy Scale: Good     Standing balance support: Single extremity supported, During functional activity Standing balance-Leahy Scale: Fair Standing balance comment: unsteady with mobility. Needing at least single UE support and min guard for safety                             Pertinent Vitals/Pain Pain Assessment Pain Assessment: Faces Faces Pain Scale: Hurts a little bit Pain Location: feet due to swelling Pain Descriptors / Indicators: Discomfort Pain Intervention(s): Monitored during session    Home Living Family/patient expects to be discharged to:: Private residence Living  Arrangements: Children Available Help at Discharge: Family;Available PRN/intermittently Type of Home: House Home Access: Stairs to enter   [employer] of Steps: a couple     Home Equipment: None      Prior Function Prior Level of Function :  Independent/Modified Independent             Mobility Comments: drives, lives with son ADLs Comments: independent     Hand Dominance        Extremity/Trunk Assessment   Upper Extremity Assessment Upper Extremity Assessment: Overall WFL for tasks assessed    Lower Extremity Assessment Lower Extremity Assessment: Overall WFL for tasks assessed    Cervical / Trunk Assessment Cervical / Trunk Assessment: Normal  Communication   Communication: HOH  Cognition Arousal/Alertness: Lethargic Behavior During Therapy: WFL for tasks assessed/performed Overall Cognitive Status: Within Functional Limits for tasks assessed                                 General Comments: patient sleeping on arrival, groggy throughout session        General Comments      Exercises     Assessment/Plan    PT Assessment Patient needs continued PT services  PT Problem List Decreased strength;Decreased mobility;Decreased safety awareness;Decreased activity tolerance;Decreased balance;Decreased knowledge of precautions;Decreased knowledge of use of DME       PT Treatment Interventions Therapeutic exercise;DME instruction;Gait training;Balance training;Functional mobility training;Stair training;Therapeutic activities;Patient/family education    PT Goals (Current goals can be found in the Care Plan section)  Acute Rehab PT Goals Patient Stated Goal: none stated PT Goal Formulation: With patient Time For Goal Achievement: 03/22/21    Frequency Min 2X/week     Co-evaluation               AM-PAC PT "6 Clicks" Mobility  Outcome Measure Help needed turning from your back to your side while in a flat bed without using bedrails?: A Little Help needed moving from lying on your back to sitting on the side of a flat bed without using bedrails?: A Little Help needed moving to and from a bed to a chair (including a wheelchair)?: A Little Help needed standing up from a chair  using your arms (e.g., wheelchair or bedside chair)?: A Little Help needed to walk in hospital room?: A Little Help needed climbing 3-5 steps with a railing? : A Lot 6 Click Score: 17    End of Session   Activity Tolerance: Patient limited by fatigue Patient left: in bed;with call bell/phone within reach;with nursing/sitter in room Nurse Communication: Mobility status PT Visit Diagnosis: Unsteadiness on feet (R26.81);Muscle weakness (generalized) (M62.81);Difficulty in walking, not elsewhere classified (R26.2)    Time: 1050-1109 PT Time Calculation (min) (ACUTE ONLY): 19 min   Charges:   PT Evaluation $PT Eval Moderate Complexity: 1 Mod PT Treatments $Gait Training: 8-22 mins        Chestina Komatsu, PT, [facility] 03/08/21,11:30 AM

## 2021-03-08 NOTE — Assessment & Plan Note (Signed)
Secondary to mild CHF exacerbation Patient not overtly symptomatic Hold off on thoracentesis

## 2021-03-08 NOTE — Progress Notes (Signed)
Initial Nutrition Assessment  DOCUMENTATION CODES:   Obesity unspecified  INTERVENTION:   -Ensure Enlive po TID, each supplement provides 350 kcal and 20 grams of protein.  -MVI with minerals daily  NUTRITION DIAGNOSIS:   Inadequate oral intake related to altered GI function as evidenced by meal completion < 25%.  GOAL:   Patient will meet greater than or equal to 90% of their needs  MONITOR:   PO intake, Supplement acceptance, Labs, Weight trends, Skin, I & O's  REASON FOR ASSESSMENT:   Malnutrition Screening Tool    ASSESSMENT:   Timothy Riley is a 81 y.o. male with medical history significant of Persistent A-fib on Eliquis, s/p bioprosthetic AVR, G2 DD (EF 50 to 55% 08/2020), diabetes, nonobstructive CAD with low risk Myoview in 2020, HTN, CKD 3B, who presents to the ED with a 2-day history of vomiting diarrhea and abdominal pain, poor oral intake with inability to hold down anything, including meds.  Vomiting is nonbloody and nonbilious.  He has no affected contacts.  Denies fever or chills.  Abdominal pain is crampy and has since subsided.  Since onset of symptoms he has noted increased lower extremity edema but denies chest pain or shortness of breath.  Pt admitted with acute gastroenteritis.   Reviewed I/O's: +558 ml x 24 hours  UOP: 200 ml x 24 hours  Pt sleepy soundly at time of visit. He did not respond to voice or touch.   Observed meal tray- pt consumed less than 25% of spaghetti meal.    Reviewed wt hx; pt has experienced a 5.2% wt loss over the past 2 weeks, which is significant for time frame, but likely related to dehydration.   Pt with poor oral intake and would benefit from nutrient dense supplement. One Ensure Enlive supplement provides 350 kcals, 20 grams protein, and 44-45 grams of carbohydrate vs one Glucerna shake supplement, which provides 220 kcals, 10 grams of protein, and 26 grams of carbohydrate. Given pt's hx of DM, RD will reassess adequacy  of PO intake, CBGS, and adjust supplement regimen as appropriate at follow-up.    Medications reviewed and include lasix and sodium chloride infusion @ 50 ml/hr.   Lab Results  Component Value Date   HGBA1C 5.4 03/07/2021   PTA DM medications are 10 mg glucotrol daily.   Labs reviewed: CBGS: 111-125 (inpatient orders for glycemic control are 0-15 units insulin aspart every 4 hours).    NUTRITION - FOCUSED PHYSICAL EXAM:  Flowsheet Row Most Recent Value  Orbital Region No depletion  Upper Arm Region No depletion  Thoracic and Lumbar Region No depletion  Buccal Region No depletion  Temple Region No depletion  Clavicle Bone Region No depletion  Clavicle and Acromion Bone Region No depletion  Scapular Bone Region No depletion  Dorsal Hand No depletion  Patellar Region No depletion  Anterior Thigh Region No depletion  Posterior Calf Region No depletion  Edema (RD Assessment) Moderate  Hair Reviewed  Eyes Reviewed  Mouth Reviewed  Skin Reviewed  Nails Reviewed       Diet Order:   Diet Order             DIET SOFT Room service appropriate? Yes; Fluid consistency: Thin  Diet effective now                   EDUCATION NEEDS:   No education needs have been identified at this time  Skin:  Skin Assessment: Reviewed RN Assessment  Last BM:  03/07/21  Height:   Ht Readings from Last 1 Encounters:  03/07/21 5\' 5"  (1.651 m)    Weight:   Wt Readings from Last 1 Encounters:  03/08/21 94.1 kg    Ideal Body Weight:  61.8 kg  BMI:  Body mass index is 34.52 kg/m.  Estimated Nutritional Needs:   Kcal:  1850-2050  Protein:  90-105 grams  Fluid:  > 1.8 L    Loistine Chance, RD, LDN, [place] Registered Dietitian II Certified Diabetes Care and Education Specialist Please refer to [facility] for RD and/or RD on-call/weekend/after hours pager

## 2021-03-08 NOTE — Assessment & Plan Note (Signed)
Holding Eliquis due to poor oral tolerance

## 2021-03-08 NOTE — Assessment & Plan Note (Signed)
No acute issues suspected 

## 2021-03-08 NOTE — Assessment & Plan Note (Signed)
Mild exacerbation related to not being able to hold on meds IV metoprolol as needed until able to tolerate orally Will hold  furosemide for now Keep legs elevated

## 2021-03-09 ENCOUNTER — Inpatient Hospital Stay: Payer: Medicare Other

## 2021-03-09 DIAGNOSIS — E1122 Type 2 diabetes mellitus with diabetic chronic kidney disease: Secondary | ICD-10-CM | POA: Diagnosis present

## 2021-03-09 DIAGNOSIS — E119 Type 2 diabetes mellitus without complications: Secondary | ICD-10-CM

## 2021-03-09 DIAGNOSIS — A084 Viral intestinal infection, unspecified: Secondary | ICD-10-CM | POA: Diagnosis present

## 2021-03-09 DIAGNOSIS — Z952 Presence of prosthetic heart valve: Secondary | ICD-10-CM

## 2021-03-09 DIAGNOSIS — I4891 Unspecified atrial fibrillation: Secondary | ICD-10-CM | POA: Diagnosis not present

## 2021-03-09 DIAGNOSIS — D696 Thrombocytopenia, unspecified: Secondary | ICD-10-CM | POA: Diagnosis present

## 2021-03-09 DIAGNOSIS — K529 Noninfective gastroenteritis and colitis, unspecified: Secondary | ICD-10-CM | POA: Diagnosis present

## 2021-03-09 DIAGNOSIS — R188 Other ascites: Secondary | ICD-10-CM | POA: Diagnosis present

## 2021-03-09 DIAGNOSIS — N1832 Chronic kidney disease, stage 3b: Secondary | ICD-10-CM

## 2021-03-09 DIAGNOSIS — I4819 Other persistent atrial fibrillation: Secondary | ICD-10-CM | POA: Diagnosis present

## 2021-03-09 DIAGNOSIS — Z8616 Personal history of COVID-19: Secondary | ICD-10-CM | POA: Diagnosis not present

## 2021-03-09 DIAGNOSIS — Z20822 Contact with and (suspected) exposure to covid-19: Secondary | ICD-10-CM | POA: Diagnosis present

## 2021-03-09 DIAGNOSIS — E876 Hypokalemia: Secondary | ICD-10-CM | POA: Diagnosis present

## 2021-03-09 DIAGNOSIS — D631 Anemia in chronic kidney disease: Secondary | ICD-10-CM | POA: Diagnosis present

## 2021-03-09 DIAGNOSIS — K8020 Calculus of gallbladder without cholecystitis without obstruction: Secondary | ICD-10-CM | POA: Diagnosis present

## 2021-03-09 DIAGNOSIS — Z953 Presence of xenogenic heart valve: Secondary | ICD-10-CM | POA: Diagnosis not present

## 2021-03-09 DIAGNOSIS — I130 Hypertensive heart and chronic kidney disease with heart failure and stage 1 through stage 4 chronic kidney disease, or unspecified chronic kidney disease: Secondary | ICD-10-CM | POA: Diagnosis present

## 2021-03-09 DIAGNOSIS — I10 Essential (primary) hypertension: Secondary | ICD-10-CM | POA: Diagnosis not present

## 2021-03-09 DIAGNOSIS — I5033 Acute on chronic diastolic (congestive) heart failure: Secondary | ICD-10-CM

## 2021-03-09 DIAGNOSIS — J90 Pleural effusion, not elsewhere classified: Secondary | ICD-10-CM

## 2021-03-09 DIAGNOSIS — R001 Bradycardia, unspecified: Secondary | ICD-10-CM | POA: Diagnosis present

## 2021-03-09 DIAGNOSIS — N179 Acute kidney failure, unspecified: Secondary | ICD-10-CM

## 2021-03-09 DIAGNOSIS — E782 Mixed hyperlipidemia: Secondary | ICD-10-CM | POA: Diagnosis present

## 2021-03-09 DIAGNOSIS — E669 Obesity, unspecified: Secondary | ICD-10-CM | POA: Diagnosis present

## 2021-03-09 DIAGNOSIS — E860 Dehydration: Secondary | ICD-10-CM | POA: Diagnosis present

## 2021-03-09 DIAGNOSIS — E785 Hyperlipidemia, unspecified: Secondary | ICD-10-CM | POA: Diagnosis present

## 2021-03-09 DIAGNOSIS — I4510 Unspecified right bundle-branch block: Secondary | ICD-10-CM | POA: Diagnosis present

## 2021-03-09 DIAGNOSIS — I25118 Atherosclerotic heart disease of native coronary artery with other forms of angina pectoris: Secondary | ICD-10-CM | POA: Diagnosis not present

## 2021-03-09 DIAGNOSIS — I272 Pulmonary hypertension, unspecified: Secondary | ICD-10-CM | POA: Diagnosis present

## 2021-03-09 DIAGNOSIS — I2510 Atherosclerotic heart disease of native coronary artery without angina pectoris: Secondary | ICD-10-CM | POA: Diagnosis present

## 2021-03-09 DIAGNOSIS — R0902 Hypoxemia: Secondary | ICD-10-CM | POA: Diagnosis present

## 2021-03-09 LAB — COMPREHENSIVE METABOLIC PANEL
ALT: 8 U/L (ref 0–44)
AST: 19 U/L (ref 15–41)
Albumin: 3 g/dL — ABNORMAL LOW (ref 3.5–5.0)
Alkaline Phosphatase: 47 U/L (ref 38–126)
Anion gap: 5 (ref 5–15)
BUN: 26 mg/dL — ABNORMAL HIGH (ref 8–23)
CO2: 29 mmol/L (ref 22–32)
Calcium: 8 mg/dL — ABNORMAL LOW (ref 8.9–10.3)
Chloride: 105 mmol/L (ref 98–111)
Creatinine, Ser: 1.96 mg/dL — ABNORMAL HIGH (ref 0.61–1.24)
GFR, Estimated: 34 mL/min — ABNORMAL LOW (ref >60)
Glucose, Bld: 120 mg/dL — ABNORMAL HIGH (ref 70–99)
Potassium: 3.2 mmol/L — ABNORMAL LOW (ref 3.5–5.1)
Sodium: 139 mmol/L (ref 135–145)
Total Bilirubin: 0.8 mg/dL (ref 0.3–1.2)
Total Protein: 5.4 g/dL — ABNORMAL LOW (ref 6.5–8.1)

## 2021-03-09 LAB — URINE CULTURE: Culture: 20000 — AB

## 2021-03-09 LAB — CBC WITH DIFFERENTIAL/PLATELET
Abs Immature Granulocytes: 0.02 10*3/uL (ref 0.00–0.07)
Basophils Absolute: 0 10*3/uL (ref 0.0–0.1)
Basophils Relative: 0 %
Eosinophils Absolute: 0.2 10*3/uL (ref 0.0–0.5)
Eosinophils Relative: 4 %
HCT: 29.3 % — ABNORMAL LOW (ref 39.0–52.0)
Hemoglobin: 9 g/dL — ABNORMAL LOW (ref 13.0–17.0)
Immature Granulocytes: 1 %
Lymphocytes Relative: 22 %
Lymphs Abs: 0.9 10*3/uL (ref 0.7–4.0)
MCH: 28.8 pg (ref 26.0–34.0)
MCHC: 30.7 g/dL (ref 30.0–36.0)
MCV: 93.6 fL (ref 80.0–100.0)
Monocytes Absolute: 0.5 10*3/uL (ref 0.1–1.0)
Monocytes Relative: 13 %
Neutro Abs: 2.3 10*3/uL (ref 1.7–7.7)
Neutrophils Relative %: 60 %
Platelets: 140 10*3/uL — ABNORMAL LOW (ref 150–400)
RBC: 3.13 MIL/uL — ABNORMAL LOW (ref 4.22–5.81)
RDW: 15.1 % (ref 11.5–15.5)
WBC: 3.9 10*3/uL — ABNORMAL LOW (ref 4.0–10.5)
nRBC: 0 % (ref 0.0–0.2)

## 2021-03-09 LAB — GLUCOSE, CAPILLARY
Glucose-Capillary: 117 mg/dL — ABNORMAL HIGH (ref 70–99)
Glucose-Capillary: 112 mg/dL — ABNORMAL HIGH (ref 70–99)
Glucose-Capillary: 138 mg/dL — ABNORMAL HIGH (ref 70–99)
Glucose-Capillary: 116 mg/dL — ABNORMAL HIGH (ref 70–99)
Glucose-Capillary: 155 mg/dL — ABNORMAL HIGH (ref 70–99)

## 2021-03-09 LAB — MAGNESIUM: Magnesium: 2 mg/dL (ref 1.7–2.4)

## 2021-03-09 MED ORDER — TRAZODONE HCL 100 MG PO TABS
100.0000 mg | ORAL_TABLET | Freq: Every day | ORAL | Status: DC
  Administered 2021-03-09 – 2021-03-14 (×6): 100 mg via ORAL
  Filled 2021-03-09 (×6): qty 1

## 2021-03-09 MED ORDER — FUROSEMIDE 10 MG/ML IJ SOLN
40.0000 mg | Freq: Two times a day (BID) | INTRAMUSCULAR | Status: DC
  Administered 2021-03-09 – 2021-03-13 (×9): 40 mg via INTRAVENOUS
  Filled 2021-03-09 (×9): qty 4

## 2021-03-09 MED ORDER — POTASSIUM CHLORIDE IN NACL 40-0.9 MEQ/L-% IV SOLN
INTRAVENOUS | Status: DC
  Filled 2021-03-09 (×5): qty 1000

## 2021-03-09 MED ORDER — POTASSIUM CHLORIDE CRYS ER 20 MEQ PO TBCR
40.0000 meq | EXTENDED_RELEASE_TABLET | Freq: Two times a day (BID) | ORAL | Status: DC
  Administered 2021-03-09 – 2021-03-15 (×12): 40 meq via ORAL
  Filled 2021-03-09 (×12): qty 2

## 2021-03-09 NOTE — Progress Notes (Signed)
PT Cancellation Note  Patient Details Name: Timothy Riley MRN: [id number] DOB: [birth year]   Cancelled Treatment:    Reason Eval/Treat Not Completed: Patient declined, no reason specified. States he is waiting for breakfast, offered to help him sit up in recliner for breakfast, continues to refuse. Will check back later if time allows.    Timothy Riley 03/09/2021, 9:21 AM

## 2021-03-09 NOTE — Consult Note (Signed)
Cardiology Consultation:   Patient ID: Timothy Riley MRN: [id number]; DOB: 11-16-1940  Admit date: 03/07/2021 Date of Consult: 03/09/2021  PCP:  Derinda Late, MD   [place] Prov[facility] Cardiologist:  Ida Rogue, MD   {  Cardiology consult requested by Dr. Remi Haggard Reason for consult congestive heart failure, shortness of breath, leg swelling  Patient Profile:   Timothy Riley is a 81 y.o. 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 reports he has had a difficult several months Reports having viral syndrome 2 to 3 months ago, treated with 1 round of prednisone Symptoms did not resolve, seen in urgent care, was told that he had COVID, treated again with prednisone Despite treatment again reports he did not get any better Feels his weight is up 18 pounds, has had worsening leg swelling, Very short of breath when he bends over Having difficulty sleeping, primary care giving him trazodone and other sleep aids Abdomen feels somewhat tight, difficulty taking a deep breath in, has more coughing  This admission presenting with diarrhea, abdominal pain, anorexia Unable to keep his pills down Also reports having some vomiting Hypertensive on arrival, mild elevation of total bilirubin 1.5  CT scan abdomen pelvis with bilateral pleural effusions, small volume ascites Has been treated with Zofran, IV fluids Feels the symptoms are getting worse, leg swelling not improving, requesting Lasix Reports he is not taking Lasix consistently at home " I am not peeing on the doxazosin" Likely mistaking the  doxazosin for his diuretic   Past Medical History:  Diagnosis Date   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 Glower MD @ [facility] via R thoracic approach; b. 03/2018 Echo: EF 60-65%, DD. Mildly dil LA. Nl fxn AoV prosthesis. Grad 38mmHg.   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 12/2013 Cath ([facility]): LAD 30p, RI 60, otw nl (per CT surgery note from [place]); b. 03/2018 MV: EF 59%, no ischemia/scar.   Persistent atrial fibrillation ([place]) 10/2017   a. CHA2DS2VASc = 5-->Eliquis; b. 12/2017 loaded w/amio-->DCCV.   PSVT (paroxysmal supraventricular tachycardia) ([place])    a. 05/2014 s/p RFCA @ [facility].   Right inguinal hernia    Urinary incontinence     Past Surgical History:  Procedure Laterality Date   AORTIC VALVE REPLACEMENT     CARDIAC CATHETERIZATION     CARDIOVERSION N/A 01/01/2018   Procedure: CARDIOVERSION;  Surgeon: Minna Merritts, MD;  Location: [facility] ORS;  Service: Cardiovascular;  Laterality: N/A;   COLONOSCOPY  2010   heart valve replacment  2015   XI ROBOTIC ASSISTED INGUINAL HERNIA REPAIR WITH MESH  Right 01/07/2019   Procedure: XI ROBOTIC ASSISTED INGUINAL HERNIA REPAIR WITH MESH;  Surgeon: Ronny Bacon, MD;  Location: [facility] ORS;  Service: General;  Laterality: Right;     Home Medications:  Prior to Admission medications   Medication Sig Start Date End Date Taking? Authorizing Provider  amoxicillin (AMOXIL) 500 MG capsule TAKE 4 CAPSULES 1 HOUR BEFORE DENTAL APPOINTMENT  04/28/20   Minna Merritts, MD  Cholecalciferol (VITAMIN D3 PO) Take by mouth daily.    [provider]  doxazosin (CARDURA) 8 MG tablet Take 1 tablet (8 mg total) by mouth 2 (two) times daily. 05/23/20   Minna Merritts, MD  ELIQUIS 2.5 MG TABS tablet TAKE 1 TABLET BY MOUTH TWICE A DAY 09/20/20   Minna Merritts, MD  furosemide (LASIX) 20 MG tablet Take 1 tablet (20 mg total) by mouth daily. 02/22/21 05/23/21  Furth, Cadence H, PA-C  glipiZIDE (GLUCOTROL) 10 MG tablet TAKE 1 TABLET BY MOUTH TWICE A DAY BEFORE A MEAL. 01/28/20   Towanda Malkin, MD  metoprolol succinate (TOPROL-XL) 25 MG 24 hr tablet Take 1 tablet (25 mg total) by mouth daily. 07/05/20   Minna Merritts, MD  pioglitazone (ACTOS) 30 MG tablet TAKE 1 TABLET BY MOUTH ONCE DAILY 01/20/20   Towanda Malkin, MD  predniSONE (DELTASONE) 10 MG tablet Take 10 mg by mouth daily with breakfast. Patient not taking: Reported on 03/08/2021    [provider]  rosuvastatin (CRESTOR) 10 MG tablet Take 1 tablet (10 mg total) by mouth daily. 06/29/20   Steele Sizer, MD    Inpatient Medications: Scheduled Meds:  apixaban  2.5 mg Oral BID   doxazosin  8 mg Oral Q12H   feeding supplement  237 mL Oral TID BM   furosemide  40 mg Intravenous BID   insulin aspart  0-15 Units Subcutaneous Q4H   metoprolol succinate  25 mg Oral Daily   multivitamin with minerals  1 tablet Oral Daily   pantoprazole (PROTONIX) IV  40 mg Intravenous Q24H   potassium chloride  40 mEq Oral BID   rosuvastatin  10 mg Oral Daily   traZODone  100 mg Oral QHS   Continuous Infusions:  PRN Meds: acetaminophen **OR** acetaminophen, ondansetron **OR** ondansetron (ZOFRAN) IV  Allergies:    Allergies  Allergen Reactions   Gabapentin Other (See Comments) and Rash    dizzy dizzy   Hydralazine Anxiety and Rash   Hydralazine Hcl Rash    Social History:   Social History   Socioeconomic History   Marital status: Married    Spouse  name: betty   Number of children: 2   Years of education: 12   Highest education level: High school graduate  Occupational History   Occupation: retired    Comment: truck Geophysicist/field seismologist  Tobacco Use   Smoking status: Former    Packs/day: 0.50    Years: 1.00    Pack years: 0.50    Types: Cigarettes    Quit date: 01/30/1972    Years since quitting: 49.1   Smokeless tobacco: Never   Tobacco comments:    over 74 yrs ago  Vaping Use   Vaping Use: Never used  Substance and Sexual Activity   Alcohol use: No    Alcohol/week: 0.0 standard drinks   Drug use: No   Sexual activity: Not Currently  Other Topics Concern   Not on file  Social History Narrative   Not on file   Social Determinants of Health  Financial Resource Strain: Not on file  Food Insecurity: Not on file  Transportation Needs: Not on file  Physical Activity: Not on file  Stress: Not on file  Social Connections: Not on file  Intimate Partner Violence: Not on file    Family History:    Family History  Problem Relation Age of Onset   Cancer Mother    Stroke Father      ROS:  Please see the history of present illness.  Review of Systems  Constitutional:  Positive for malaise/fatigue.  HENT: Negative.    Respiratory:  Positive for cough and shortness of breath.   Cardiovascular:  Positive for leg swelling.  Gastrointestinal: Negative.   Musculoskeletal: Negative.   Neurological: Negative.   Psychiatric/Behavioral: Negative.    All other systems reviewed and are negative.   Physical Exam/Data:   Vitals:   03/09/21 0754 03/09/21 0925 03/09/21 1135 03/09/21 1545  BP: (!) 153/76  138/65 (!) 159/73  Pulse: 69 (!) 58 69 71  Resp: 18  18 18   Temp: 98 F (36.7 C)  98.4 F (36.9 C) 97.9 F (36.6 C)  TempSrc:      SpO2: 99%  98% 94%  Weight:      Height:        Intake/Output Summary (Last 24 hours) at 03/09/2021 1837 Last data filed at 03/09/2021 1600 Gross per 24 hour  Intake 506.42 ml  Output 150 ml  Net  356.42 ml   Last 3 Weights 03/09/2021 03/08/2021 03/07/2021  Weight (lbs) 206 lb 9.1 oz 207 lb 7.3 oz 219 lb  Weight (kg) 93.7 kg 94.1 kg 99.338 kg     Body mass index is 34.38 kg/m.  General:  Well nourished, well developed, in no acute distress HEENT: normal Neck: JVD 10+ Vascular: No carotid bruits; Distal pulses 2+ bilaterally Cardiac:  normal S1, S2; RRR; no murmur  Lungs:  clear to auscultation bilaterally, no wheezing, rhonchi or rales  Abd: soft, nontender, no hepatomegaly  Ext: 2+ pitting bilateral lower extremity edema Musculoskeletal:  No deformities, BUE and BLE strength normal and equal Skin: warm and dry  Neuro:  CNs 2-12 intact, no focal abnormalities noted Psych:  Normal affect   EKG:  The EKG was personally reviewed and demonstrates:   Sinus tachycardia right bundle branch block  Telemetry:  Telemetry was personally reviewed and demonstrates:   Normal sinus rhythm  Relevant CV Studies: Echocardiogram September 13, 2020   1. Left ventricular ejection fraction, by estimation, is 50 to 55%. The  left ventricle has low normal function. Left ventricular endocardial  border not optimally defined to evaluate regional wall motion. There is  mild left ventricular hypertrophy. Left  ventricular diastolic parameters are consistent with Grade II diastolic  dysfunction (pseudonormalization). Elevated left atrial pressure.   2. Right ventricular systolic function is normal. The right ventricular  size is normal. There is moderately elevated pulmonary artery systolic  pressure.   3. Left atrial size was moderately dilated.   4. Right atrial size was mildly dilated.   5. The mitral valve is abnormal. Mild mitral valve regurgitation. No  evidence of mitral stenosis.   6. Tricuspid valve regurgitation is mild to moderate.   7. The aortic valve has been repaired/replaced. Aortic valve  regurgitation is mild to moderate. There is a 23 mm Edwards bioprosthetic  valve present in the  aortic position. Procedure Date: 03/01/14. Aortic valve  mean gradient measures 13.0 mmHg.   8. The inferior vena cava is dilated  in size with >50% respiratory  variability, suggesting right atrial pressure of 8 mmHg.   Laboratory Data:  High Sensitivity Troponin:   Recent Labs  Lab 03/07/21 2207  TROPONINIHS 41*     Chemistry Recent Labs  Lab 03/07/21 2207 03/09/21 0320  NA 139 139  K 3.5 3.2*  CL 101 105  CO2 27 29  GLUCOSE 166* 120*  BUN 22 26*  CREATININE 1.68* 1.96*  CALCIUM 9.2 8.0*  MG  --  2.0  GFRNONAA 40* 34*  ANIONGAP 11 5    Recent Labs  Lab 03/07/21 2207 03/09/21 0320  PROT 7.0 5.4*  ALBUMIN 3.9 3.0*  AST 27 19  ALT 12 8  ALKPHOS 66 47  BILITOT 1.5* 0.8   Lipids No results for input(s): CHOL, TRIG, HDL, LABVLDL, LDLCALC, CHOLHDL in the last 168 hours.  Hematology Recent Labs  Lab 03/07/21 2207 03/09/21 0320  WBC 4.7 3.9*  RBC 4.05* 3.13*  HGB 11.6* 9.0*  HCT 37.5* 29.3*  MCV 92.6 93.6  MCH 28.6 28.8  MCHC 30.9 30.7  RDW 14.6 15.1  PLT 157 140*   Thyroid No results for input(s): TSH, FREET4 in the last 168 hours.  BNPNo results for input(s): BNP, PROBNP in the last 168 hours.  DDimer No results for input(s): DDIMER in the last 168 hours.   Radiology/Studies:  CT Abdomen Pelvis Wo Contrast  Result Date: 03/07/2021 CLINICAL DATA:  Nausea, vomiting, and diarrhea. EXAM: CT ABDOMEN AND PELVIS WITHOUT CONTRAST TECHNIQUE: Multidetector CT imaging of the abdomen and pelvis was performed following the standard protocol without IV contrast. RADIATION DOSE REDUCTION: This exam was performed according to the departmental dose-optimization program which includes automated exposure control, adjustment of the mA and/or kV according to patient size and/or use of iterative reconstruction technique. COMPARISON:  None. FINDINGS: Lower chest: Moderate bilateral pleural effusions with basilar atelectasis. Mild cardiac enlargement. Hepatobiliary: Cholelithiasis  with distended gallbladder. No wall thickening or inflammatory stranding. No focal liver lesions. Pancreas: Unremarkable. No pancreatic ductal dilatation or surrounding inflammatory changes. Spleen: Mild splenic enlargement.  Calcified granuloma. Adrenals/Urinary Tract: No adrenal gland nodules. No renal or ureteral stone or obstruction. Mild stranding around the kidneys likely due to edema. Bladder wall is diffusely thickened, suggesting cystitis. Correlate with urinalysis. Stomach/Bowel: Stomach, small bowel, and colon are not abnormally distended. No wall thickening or inflammatory changes are appreciated. Scattered colonic diverticula without evidence of acute diverticulitis. Appendix is normal. Vascular/Lymphatic: Aortic atherosclerosis. No enlarged abdominal or pelvic lymph nodes. Reproductive: Prostate is unremarkable. Other: Small amount of free fluid in the upper abdomen and along the pericolic gutters. No free air. Minimal periumbilical hernia containing fat. Musculoskeletal: Degenerative changes in the spine. IMPRESSION: 1. Bilateral pleural effusions with basilar atelectasis. 2. Small volume abdominal and pelvic ascites with edema around the kidneys. No loculated collections. 3. No bowel obstruction or inflammation. 4. Cholelithiasis without evidence of acute cholecystitis. 5. Mild splenic enlargement. 6. Aortic atherosclerosis. Electronically Signed   By: Lucienne Capers M.D.   On: 03/07/2021 23:33   DG Chest Port 1 View  Result Date: 03/09/2021 CLINICAL DATA:  Dyspnea.  Coronary artery disease. EXAM: PORTABLE CHEST 1 VIEW COMPARISON:  11/05/2017 FINDINGS: Mild-to-moderate cardiomegaly is increased since previous study. New diffuse interstitial infiltrates are consistent with interstitial edema. Small layering left pleural effusion is also suspected. IMPRESSION: Mild congestive heart failure. Electronically Signed   By: Marlaine Hind M.D.   On: 03/09/2021 15:00     Assessment and Plan:   Acute  on chronic diastolic CHF/pulmonary hypertension Reports worsening symptoms over the past 2 months or so, as outpatient treated with several rounds of steroids with no improvement of his symptoms -Reports 18 pound weight gain -Abdominal distention, worsening leg swelling, difficulty sleeping, more coughing -Clinical exam concerning for CHF, CT scan on arrival with bilateral pleural effusions, mild ascites, -Chest x-ray today with mild CHF -Agree with holding IV fluids, would start IV Lasix twice daily -Appears he has not been taking his Lasix as outpatient.  Some confusion with his blood pressure medications.  -Blood pressure mildly elevated, will continue to monitor with diuresis Given chronic renal dysfunction, will need to monitor closely Suspect he will need diuresis for several days before euvolemic  Gastroenteritis Reports symptoms have improved, had cramping, diarrhea, vomiting Feels back to his baseline, had good dinner tonight  Atrial fibrillation Eliquis, metoprolol  Status post AVR, bioprosthetic Periodic echo surveillance   Long discussion with him concerning his medications, recent events over the past several months  Total encounter time more than 110 minutes  Greater than 50% was spent in counseling and coordination of care with the patient    For questions or updates, please contact [place] Please consult [email or web address] for contact info under    Signed, Ida Rogue, MD  03/09/2021 6:37 PM

## 2021-03-09 NOTE — Progress Notes (Addendum)
PROGRESS NOTE    Timothy Riley  KAJ:[id number] DOB: 1940/07/03 DOA: 03/07/2021 PCP: Derinda Late, MD   Brief Narrative:  81 year old male with history of persistent A-fib on Eliquis, status post bioprosthetic AVR, grade 2 diastolic dysfunction (EF of 50 to 55% in 08/2020, diabetes mellitus type 2, nonobstructive CAD, hypertension, CKD stage IIIb presented with vomiting, diarrhea, abdominal pain and poor oral intake.  On presentation, he was afebrile with normal WBCs and lactic acid with creatinine of 1.68.  CT of the abdomen and pelvis showed bilateral pleural effusions, small volume abdominal and pelvic ascites with edema around the kidneys, no bowel obstruction, cholelithiasis without acute cholecystitis and mild splenic enlargement.  He was started on IV fluids.  Assessment & Plan:   Possible acute gastroenteritis -Suspect viral. -Diarrhea has resolved.  DC stool testing. -Tolerating advanced soft diet.  IV fluids as below.  Continue IV Protonix and antiemetics as needed  Acute kidney injury on chronic kidney disease stage IIIb -Creatinine 1.96 today.  Baseline creatinine of around 1.5-1.6.  Will resume IV fluids with potassium supplementation today.  Repeat a.m. labs.  Hold Lasix.  Chronic diastolic CHF -Currently no signs of exacerbation.  Continue to hold Lasix.  Strict input and output.  Daily weights.  Continue metoprolol.  Outpatient follow-up with cardiology  Persistent A-fib -Currently mildly bradycardic.  Continue metoprolol succinate and Eliquis  Diabetes mellitus type 2 -A1c 5.4.  Continue CBGs with SSI  BPH -Continue doxazosin  CAD -Stable.  No chest pain.  Continue metoprolol and statin  Hyperlipidemia Continue statin  History of AVR -Outpatient follow-up with cardiology   DVT prophylaxis: Eliquis Code Status: Full Family Communication: Spoke to son on phone Disposition Plan: Status is: Observation The patient will require care spanning > 2 midnights  and should be moved to inpatient because: Of need for IV fluids for acute kidney injury   Consultants: None  Procedures: None  Antimicrobials: None   Subjective: Patient seen and examined at bedside.  Slightly better but still feels weak.  Diarrhea has resolved.  Complains of lower extremity swelling.  Denies worsening abdominal pain currently.  No overnight fever or vomiting reported.  Objective: Vitals:   03/08/21 2329 03/09/21 0334 03/09/21 0500 03/09/21 0754  BP: 140/72 (!) 145/74  (!) 153/76  Pulse: 67 (!) 59  69  Resp: 15 16  18   Temp: 98.3 F (36.8 C) 98 F (36.7 C)  98 F (36.7 C)  TempSrc:      SpO2: 98% 99%  99%  Weight:   93.7 kg   Height:        Intake/Output Summary (Last 24 hours) at 03/09/2021 0801 Last data filed at 03/08/2021 1641 Gross per 24 hour  Intake 323.01 ml  Output 200 ml  Net 123.01 ml   Filed Weights   03/07/21 2203 03/08/21 0800 03/09/21 0500  Weight: 99.3 kg 94.1 kg 93.7 kg    Examination:  General exam: Appears calm and comfortable.  Currently on room air. Respiratory system: Bilateral decreased breath sounds at bases Cardiovascular system: S1 & S2 heard, Rate controlled Gastrointestinal system: Abdomen is nondistended, soft and nontender. Normal bowel sounds heard. Extremities: No cyanosis, clubbing; bilateral lower extremity edema present. Central nervous system: Alert and oriented. No focal neurological deficits. Moving extremities Skin: No rashes, lesions or ulcers Psychiatry: Judgement and insight appear normal. Mood & affect appropriate.     Data Reviewed: I have personally reviewed following labs and imaging studies  CBC: Recent Labs  Lab 03/07/21  2207 03/09/21 0320  WBC 4.7 3.9*  NEUTROABS  --  2.3  HGB 11.6* 9.0*  HCT 37.5* 29.3*  MCV 92.6 93.6  PLT 157 161*   Basic Metabolic Panel: Recent Labs  Lab 03/07/21 2207 03/09/21 0320  NA 139 139  K 3.5 3.2*  CL 101 105  CO2 27 29  GLUCOSE 166* 120*  BUN 22 26*   CREATININE 1.68* 1.96*  CALCIUM 9.2 8.0*  MG  --  2.0   GFR: Estimated Creatinine Clearance: 30.6 mL/min (A) (by C-G formula based on SCr of 1.96 mg/dL (H)). Liver Function Tests: Recent Labs  Lab 03/07/21 2207 03/09/21 0320  AST 27 19  ALT 12 8  ALKPHOS 66 47  BILITOT 1.5* 0.8  PROT 7.0 5.4*  ALBUMIN 3.9 3.0*   Recent Labs  Lab 03/07/21 2207  LIPASE 27   No results for input(s): AMMONIA in the last 168 hours. Coagulation Profile: No results for input(s): INR, PROTIME in the last 168 hours. Cardiac Enzymes: No results for input(s): CKTOTAL, CKMB, CKMBINDEX, TROPONINI in the last 168 hours. BNP (last 3 results) No results for input(s): PROBNP in the last 8760 hours. HbA1C: Recent Labs    03/07/21 2207  HGBA1C 5.4   CBG: Recent Labs  Lab 03/08/21 1525 03/08/21 2015 03/08/21 2332 03/09/21 0336 03/09/21 0747  GLUCAP 125* 106* 96 117* 112*   Lipid Profile: No results for input(s): CHOL, HDL, LDLCALC, TRIG, CHOLHDL, LDLDIRECT in the last 72 hours. Thyroid Function Tests: No results for input(s): TSH, T4TOTAL, FREET4, T3FREE, THYROIDAB in the last 72 hours. Anemia Panel: No results for input(s): VITAMINB12, FOLATE, FERRITIN, TIBC, IRON, RETICCTPCT in the last 72 hours. Sepsis Labs: Recent Labs  Lab 03/08/21 0107 03/08/21 0202  LATICACIDVEN 1.1 0.8    Recent Results (from the past 240 hour(s))  Resp Panel by RT-PCR (Flu A&B, Covid) Nasopharyngeal Swab     Status: None   Collection Time: 03/07/21 11:21 PM   Specimen: Nasopharyngeal Swab; Nasopharyngeal(NP) swabs in vial transport medium  Result Value Ref Range Status   SARS Coronavirus 2 by RT PCR NEGATIVE NEGATIVE Final    Comment: (NOTE) SARS-CoV-2 target nucleic acids are NOT DETECTED.  The SARS-CoV-2 RNA is generally detectable in upper respiratory specimens during the acute phase of infection. The lowest concentration of SARS-CoV-2 viral copies this assay can detect is 138 copies/mL. A negative  result does not preclude SARS-Cov-2 infection and should not be used as the sole basis for treatment or other patient management decisions. A negative result may occur with  improper specimen collection/handling, submission of specimen other than nasopharyngeal swab, presence of viral mutation(s) within the areas targeted by this assay, and inadequate number of viral copies(<138 copies/mL). A negative result must be combined with clinical observations, patient history, and epidemiological information. The expected result is Negative.  Fact Sheet for Patients:  EntrepreneurPulse.com.au  Fact Sheet for Healthcare Providers:  IncredibleEmployment.be  This test is no t yet approved or cleared by the [place] FDA and  has been authorized for detection and/or diagnosis of SARS-CoV-2 by FDA under an Emergency Use Authorization (EUA). This EUA will remain  in effect (meaning this test can be used) for the duration of the COVID-19 declaration under Section 564(b)(1) of the Act, 21 U.S.C.section 360bbb-3(b)(1), unless the authorization is terminated  or revoked sooner.       Influenza A by PCR NEGATIVE NEGATIVE Final   Influenza B by PCR NEGATIVE NEGATIVE Final    Comment: (NOTE) The  Xpert Xpress SARS-CoV-2/FLU/RSV plus assay is intended as an aid in the diagnosis of influenza from Nasopharyngeal swab specimens and should not be used as a sole basis for treatment. Nasal washings and aspirates are unacceptable for Xpert Xpress SARS-CoV-2/FLU/RSV testing.  Fact Sheet for Patients: EntrepreneurPulse.com.au  Fact Sheet for Healthcare Providers: IncredibleEmployment.be  This test is not yet approved or cleared by the [place] FDA and has been authorized for detection and/or diagnosis of SARS-CoV-2 by FDA under an Emergency Use Authorization (EUA). This EUA will remain in effect (meaning this test can be used)  for the duration of the COVID-19 declaration under Section 564(b)(1) of the Act, 21 U.S.C. section 360bbb-3(b)(1), unless the authorization is terminated or revoked.  Performed at [facility], [address] [place], [place] [address]   Culture, blood (routine x 2)     Status: None (Preliminary result)   Collection Time: 03/08/21  1:07 AM   Specimen: BLOOD  Result Value Ref Range Status   Specimen Description BLOOD RIGHT HAND  Final   Special Requests   Final    BOTTLES DRAWN AEROBIC AND ANAEROBIC Blood Culture results may not be optimal due to an inadequate volume of blood received in culture bottles   Culture   Final    NO GROWTH < 24 HOURS Performed at [facility], [address] [place], [place] [address]    Report Status PENDING  Incomplete  Urine Culture     Status: None (Preliminary result)   Collection Time: 03/08/21  1:17 AM   Specimen: Urine, Clean Catch  Result Value Ref Range Status   Specimen Description   Final    URINE, CLEAN CATCH Performed at [facility], [address] [place], [place] [address]    Special Requests   Final    NONE Performed at [facility], [address], [place], [place] [address]    Culture   Final    CULTURE REINCUBATED FOR BETTER GROWTH Performed at [facility], [place] [address], [place], [place] [address]    Report Status PENDING  Incomplete  Culture, blood (routine x 2)     Status: None (Preliminary result)   Collection Time: 03/08/21  2:02 AM   Specimen: BLOOD  Result Value Ref Range Status   Specimen Description BLOOD RIGHT HAND  Final   Special Requests   Final    BOTTLES DRAWN AEROBIC AND ANAEROBIC Blood Culture adequate volume   Culture   Final    NO GROWTH < 24 HOURS Performed at [facility], [address], [place], [place] [address]    Report Status PENDING  Incomplete         Radiology Studies: CT Abdomen Pelvis Wo Contrast  Result Date:  03/07/2021 CLINICAL DATA:  Nausea, vomiting, and diarrhea. EXAM: CT ABDOMEN AND PELVIS WITHOUT CONTRAST TECHNIQUE: Multidetector CT imaging of the abdomen and pelvis was performed following the standard protocol without IV contrast. RADIATION DOSE REDUCTION: This exam was performed according to the departmental dose-optimization program which includes automated exposure control, adjustment of the mA and/or kV according to patient size and/or use of iterative reconstruction technique. COMPARISON:  None. FINDINGS: Lower chest: Moderate bilateral pleural effusions with basilar atelectasis. Mild cardiac enlargement. Hepatobiliary: Cholelithiasis with distended gallbladder. No wall thickening or inflammatory stranding. No focal liver lesions. Pancreas: Unremarkable. No pancreatic ductal dilatation or surrounding inflammatory changes. Spleen: Mild splenic enlargement.  Calcified granuloma. Adrenals/Urinary Tract: No adrenal gland nodules. No renal or ureteral stone  or obstruction. Mild stranding around the kidneys likely due to edema. Bladder wall is diffusely thickened, suggesting cystitis. Correlate with urinalysis. Stomach/Bowel: Stomach, small bowel, and colon are not abnormally distended. No wall thickening or inflammatory changes are appreciated. Scattered colonic diverticula without evidence of acute diverticulitis. Appendix is normal. Vascular/Lymphatic: Aortic atherosclerosis. No enlarged abdominal or pelvic lymph nodes. Reproductive: Prostate is unremarkable. Other: Small amount of free fluid in the upper abdomen and along the pericolic gutters. No free air. Minimal periumbilical hernia containing fat. Musculoskeletal: Degenerative changes in the spine. IMPRESSION: 1. Bilateral pleural effusions with basilar atelectasis. 2. Small volume abdominal and pelvic ascites with edema around the kidneys. No loculated collections. 3. No bowel obstruction or inflammation. 4. Cholelithiasis without evidence of acute  cholecystitis. 5. Mild splenic enlargement. 6. Aortic atherosclerosis. Electronically Signed   By: Lucienne Capers M.D.   On: 03/07/2021 23:33        Scheduled Meds:  apixaban  2.5 mg Oral BID   doxazosin  8 mg Oral Q12H   feeding supplement  237 mL Oral TID BM   insulin aspart  0-15 Units Subcutaneous Q4H   metoprolol succinate  25 mg Oral Daily   multivitamin with minerals  1 tablet Oral Daily   pantoprazole (PROTONIX) IV  40 mg Intravenous Q24H   rosuvastatin  10 mg Oral Daily   Continuous Infusions:  0.9 % NaCl with KCl 40 mEq / Jeronimo Norma, MD [facility] 03/09/2021, 8:01 AM

## 2021-03-09 NOTE — Progress Notes (Addendum)
Met with the patient in the room to discuss DC planning and Assist with needs He stated that noone has ever explained CHF to him, I spent some time and provided education around reading labels and how CHF works and how fluid will accumulate, I explained the importance of dry weights and weighing daily, I explained that salt/sodium will increase fluid accumulation often make it more difficult to breath, I explained the need to decrease sodium to decrease fluid and swelling, I explained to elevate his feet when not up walking He stated that he has swelling and is on Oxygen here not at home He asked to speak with another physician  I reached out to his physician and the Medical director and let them know He stated that he is independent at home and does not want Home health, he stated that he does not want DME,  He still drives and is in the process of building a place of his own next door to his son I provided my contact information and encouraged him to reach out during his hospital stay if he has any needs to prepare for Discharge

## 2021-03-09 NOTE — Care Management Obs Status (Signed)
[place] NOTIFICATION   Patient Details  Name: Timothy Riley MRN: [id number] Date of Birth: 10/28/1940   Medicare Observation Status Notification Given:  Yes    Conception Oms, RN 03/09/2021, 10:27 AM

## 2021-03-10 ENCOUNTER — Ambulatory Visit: Payer: Medicare Other | Admitting: Medical

## 2021-03-10 DIAGNOSIS — I5033 Acute on chronic diastolic (congestive) heart failure: Secondary | ICD-10-CM | POA: Diagnosis not present

## 2021-03-10 LAB — CBC WITH DIFFERENTIAL/PLATELET
Abs Immature Granulocytes: 0.03 10*3/uL (ref 0.00–0.07)
Basophils Absolute: 0 10*3/uL (ref 0.0–0.1)
Basophils Relative: 0 %
Eosinophils Absolute: 0.2 10*3/uL (ref 0.0–0.5)
Eosinophils Relative: 4 %
HCT: 30.5 % — ABNORMAL LOW (ref 39.0–52.0)
Hemoglobin: 9.3 g/dL — ABNORMAL LOW (ref 13.0–17.0)
Immature Granulocytes: 1 %
Lymphocytes Relative: 19 %
Lymphs Abs: 0.8 10*3/uL (ref 0.7–4.0)
MCH: 28.4 pg (ref 26.0–34.0)
MCHC: 30.5 g/dL (ref 30.0–36.0)
MCV: 93 fL (ref 80.0–100.0)
Monocytes Absolute: 0.5 10*3/uL (ref 0.1–1.0)
Monocytes Relative: 11 %
Neutro Abs: 3 10*3/uL (ref 1.7–7.7)
Neutrophils Relative %: 65 %
Platelets: 140 10*3/uL — ABNORMAL LOW (ref 150–400)
RBC: 3.28 MIL/uL — ABNORMAL LOW (ref 4.22–5.81)
RDW: 14.9 % (ref 11.5–15.5)
WBC: 4.5 10*3/uL (ref 4.0–10.5)
nRBC: 0 % (ref 0.0–0.2)

## 2021-03-10 LAB — BASIC METABOLIC PANEL
Anion gap: 4 — ABNORMAL LOW (ref 5–15)
BUN: 29 mg/dL — ABNORMAL HIGH (ref 8–23)
CO2: 31 mmol/L (ref 22–32)
Calcium: 8.3 mg/dL — ABNORMAL LOW (ref 8.9–10.3)
Chloride: 104 mmol/L (ref 98–111)
Creatinine, Ser: 1.87 mg/dL — ABNORMAL HIGH (ref 0.61–1.24)
GFR, Estimated: 35 mL/min — ABNORMAL LOW (ref >60)
Glucose, Bld: 107 mg/dL — ABNORMAL HIGH (ref 70–99)
Potassium: 4.1 mmol/L (ref 3.5–5.1)
Sodium: 139 mmol/L (ref 135–145)

## 2021-03-10 LAB — MAGNESIUM: Magnesium: 2 mg/dL (ref 1.7–2.4)

## 2021-03-10 LAB — GLUCOSE, CAPILLARY
Glucose-Capillary: 102 mg/dL — ABNORMAL HIGH (ref 70–99)
Glucose-Capillary: 118 mg/dL — ABNORMAL HIGH (ref 70–99)
Glucose-Capillary: 151 mg/dL — ABNORMAL HIGH (ref 70–99)
Glucose-Capillary: 171 mg/dL — ABNORMAL HIGH (ref 70–99)
Glucose-Capillary: 96 mg/dL (ref 70–99)
Glucose-Capillary: 98 mg/dL (ref 70–99)

## 2021-03-10 NOTE — Progress Notes (Signed)
PROGRESS NOTE    Timothy Riley  OEU:[id number] DOB: 13-Jun-1940 DOA: 03/07/2021 PCP: Derinda Late, MD   Brief Narrative:  81 year old male with history of persistent A-fib on Eliquis, status post bioprosthetic AVR, grade 2 diastolic dysfunction (EF of 50 to 55% in 08/2020, diabetes mellitus type 2, nonobstructive CAD, hypertension, CKD stage IIIb presented with vomiting, diarrhea, abdominal pain and poor oral intake.  On presentation, he was afebrile with normal WBCs and lactic acid with creatinine of 1.68.  CT of the abdomen and pelvis showed bilateral pleural effusions, small volume abdominal and pelvic ascites with edema around the kidneys, no bowel obstruction, cholelithiasis without acute cholecystitis and mild splenic enlargement.  He was started on IV fluids.  Subsequently, IV fluids discontinued on 03/09/2021 because of dyspnea and lower extremity swelling.  Cardiology was consulted on 03/09/2021.  Assessment & Plan:   Possible acute gastroenteritis -Suspect viral. -Diarrhea has resolved.  DC'd stool testing. -Tolerating diet.  IV discontinued on 03/09/2021.  Continue IV Protonix and antiemetics as needed  Acute kidney injury on chronic kidney disease stage IIIb -Creatinine slightly improving to 1.87 today.  Baseline creatinine of around 1.5-1.6. Repeat a.m. labs.  Diuretic plan as below.  Acute on chronic diastolic CHF -Lasix held on presentation.  Strict input and output.  Daily weights.  Continue metoprolol.   -Patient was more dyspneic with worsening leg swelling on 03/09/2021: IV fluids subsequently discontinued; chest x-ray showed pulmonary congestion.  Cardiology was consulted and patient was started on IV Lasix.  Persistent A-fib -Currently rate controlled.  Continue metoprolol succinate and Eliquis  Diabetes mellitus type 2 -A1c 5.4.  Continue CBGs with SSI  Hypokalemia -Resolved  BPH -Continue doxazosin  CAD -Stable.  No chest pain.  Continue metoprolol and  statin  Hyperlipidemia Continue statin  History of AVR -Outpatient follow-up with cardiology   DVT prophylaxis: Eliquis Code Status: Full Family Communication: Spoke to son on phone on 03/09/2021 Disposition Plan: Status is: inpatient because: Of need for IV Lasix for fluid overload   Consultants: Cardiology  Procedures: None  Antimicrobials: None   Subjective: Patient seen and examined at bedside.  Feels slightly short of breath with minimal exertion but feels slightly better and thinks that he is leg swelling is improving as well..  No overnight fever, vomiting, worsening abdominal pain reported.  Diarrhea has resolved. Objective: Vitals:   03/09/21 1545 03/09/21 1957 03/10/21 0424 03/10/21 0500  BP: (!) 159/73 (!) 149/64 (!) 142/62   Pulse: 71 71 72   Resp: 18 16 16    Temp: 97.9 F (36.6 C) 98.3 F (36.8 C) 97.6 F (36.4 C)   TempSrc:      SpO2: 94% 99% 97%   Weight:    92.3 kg  Height:        Intake/Output Summary (Last 24 hours) at 03/10/2021 0747 Last data filed at 03/10/2021 0735 Gross per 24 hour  Intake 983.42 ml  Output 1850 ml  Net -866.58 ml    Filed Weights   03/08/21 0800 03/09/21 0500 03/10/21 0500  Weight: 94.1 kg 93.7 kg 92.3 kg    Examination:  General exam: Intermittently requiring some supplemental oxygen.  Extremely hard of hearing.  Elderly male lying in bed. Respiratory system: Decreased breath sounds at bases bilaterally with bibasilar crackles Cardiovascular system: Currently rate controlled; S1-S2 heard gastrointestinal system: Abdomen is distended slightly; soft and nontender.  Bowel sounds are heard  extremities: Lower extremity edema present bilaterally; no cyanosis Central nervous system: Awake, slow to respond,  poor historian.  No focal neurological deficits.  Moves extremities  skin: No obvious ecchymosis/lesions  psychiatry: Affect is mostly flat.  No signs of agitation currently.   Data Reviewed: I have personally reviewed  following labs and imaging studies  CBC: Recent Labs  Lab 03/07/21 2207 03/09/21 0320 03/10/21 0442  WBC 4.7 3.9* 4.5  NEUTROABS  --  2.3 3.0  HGB 11.6* 9.0* 9.3*  HCT 37.5* 29.3* 30.5*  MCV 92.6 93.6 93.0  PLT 157 140* 140*    Basic Metabolic Panel: Recent Labs  Lab 03/07/21 2207 03/09/21 0320 03/10/21 0442  NA 139 139 139  K 3.5 3.2* 4.1  CL 101 105 104  CO2 27 29 31   GLUCOSE 166* 120* 107*  BUN 22 26* 29*  CREATININE 1.68* 1.96* 1.87*  CALCIUM 9.2 8.0* 8.3*  MG  --  2.0 2.0    GFR: Estimated Creatinine Clearance: 31.8 mL/min (A) (by C-G formula based on SCr of 1.87 mg/dL (H)). Liver Function Tests: Recent Labs  Lab 03/07/21 2207 03/09/21 0320  AST 27 19  ALT 12 8  ALKPHOS 66 47  BILITOT 1.5* 0.8  PROT 7.0 5.4*  ALBUMIN 3.9 3.0*    Recent Labs  Lab 03/07/21 2207  LIPASE 27    No results for input(s): AMMONIA in the last 168 hours. Coagulation Profile: No results for input(s): INR, PROTIME in the last 168 hours. Cardiac Enzymes: No results for input(s): CKTOTAL, CKMB, CKMBINDEX, TROPONINI in the last 168 hours. BNP (last 3 results) No results for input(s): PROBNP in the last 8760 hours. HbA1C: Recent Labs    03/07/21 2207  HGBA1C 5.4    CBG: Recent Labs  Lab 03/09/21 1133 03/09/21 1521 03/09/21 2010 03/10/21 0005 03/10/21 0419  GLUCAP 138* 116* 155* 118* 98    Lipid Profile: No results for input(s): CHOL, HDL, LDLCALC, TRIG, CHOLHDL, LDLDIRECT in the last 72 hours. Thyroid Function Tests: No results for input(s): TSH, T4TOTAL, FREET4, T3FREE, THYROIDAB in the last 72 hours. Anemia Panel: No results for input(s): VITAMINB12, FOLATE, FERRITIN, TIBC, IRON, RETICCTPCT in the last 72 hours. Sepsis Labs: Recent Labs  Lab 03/08/21 0107 03/08/21 0202  LATICACIDVEN 1.1 0.8     Recent Results (from the past 240 hour(s))  Resp Panel by RT-PCR (Flu A&B, Covid) Nasopharyngeal Swab     Status: None   Collection Time: 03/07/21 11:21 PM    Specimen: Nasopharyngeal Swab; Nasopharyngeal(NP) swabs in vial transport medium  Result Value Ref Range Status   SARS Coronavirus 2 by RT PCR NEGATIVE NEGATIVE Final    Comment: (NOTE) SARS-CoV-2 target nucleic acids are NOT DETECTED.  The SARS-CoV-2 RNA is generally detectable in upper respiratory specimens during the acute phase of infection. The lowest concentration of SARS-CoV-2 viral copies this assay can detect is 138 copies/mL. A negative result does not preclude SARS-Cov-2 infection and should not be used as the sole basis for treatment or other patient management decisions. A negative result may occur with  improper specimen collection/handling, submission of specimen other than nasopharyngeal swab, presence of viral mutation(s) within the areas targeted by this assay, and inadequate number of viral copies(<138 copies/mL). A negative result must be combined with clinical observations, patient history, and epidemiological information. The expected result is Negative.  Fact Sheet for Patients:  EntrepreneurPulse.com.au  Fact Sheet for Healthcare Providers:  IncredibleEmployment.be  This test is no t yet approved or cleared by the [place] FDA and  has been authorized for detection and/or diagnosis of SARS-CoV-2 by  FDA under an Emergency Use Authorization (EUA). This EUA will remain  in effect (meaning this test can be used) for the duration of the COVID-19 declaration under Section 564(b)(1) of the Act, 21 U.S.C.section 360bbb-3(b)(1), unless the authorization is terminated  or revoked sooner.       Influenza A by PCR NEGATIVE NEGATIVE Final   Influenza B by PCR NEGATIVE NEGATIVE Final    Comment: (NOTE) The Xpert Xpress SARS-CoV-2/FLU/RSV plus assay is intended as an aid in the diagnosis of influenza from Nasopharyngeal swab specimens and should not be used as a sole basis for treatment. Nasal washings and aspirates are  unacceptable for Xpert Xpress SARS-CoV-2/FLU/RSV testing.  Fact Sheet for Patients: EntrepreneurPulse.com.au  Fact Sheet for Healthcare Providers: IncredibleEmployment.be  This test is not yet approved or cleared by the [place] FDA and has been authorized for detection and/or diagnosis of SARS-CoV-2 by FDA under an Emergency Use Authorization (EUA). This EUA will remain in effect (meaning this test can be used) for the duration of the COVID-19 declaration under Section 564(b)(1) of the Act, 21 U.S.C. section 360bbb-3(b)(1), unless the authorization is terminated or revoked.  Performed at [facility], [address] [place], [place] [address]   Culture, blood (routine x 2)     Status: None (Preliminary result)   Collection Time: 03/08/21  1:07 AM   Specimen: BLOOD  Result Value Ref Range Status   Specimen Description BLOOD RIGHT HAND  Final   Special Requests   Final    BOTTLES DRAWN AEROBIC AND ANAEROBIC Blood Culture results may not be optimal due to an inadequate volume of blood received in culture bottles   Culture   Final    NO GROWTH 2 DAYS Performed at [facility], [address] [place], [place] [address]    Report Status PENDING  Incomplete  Urine Culture     Status: Abnormal   Collection Time: 03/08/21  1:17 AM   Specimen: Urine, Clean Catch  Result Value Ref Range Status   Specimen Description   Final    URINE, CLEAN CATCH Performed at [facility], [address], [place], [place] [address]    Special Requests   Final    NONE Performed at [facility], [address], [place], [place] [address]    Culture (A)  Final    20,000 COLONIES/mL STREPTOCOCCUS AGALACTIAE TESTING AGAINST S. AGALACTIAE NOT ROUTINELY PERFORMED DUE TO PREDICTABILITY OF AMP/PEN/VAN SUSCEPTIBILITY. Performed at [facility], [place] [address] [place], [place] [address]    Report Status 03/09/2021  FINAL  Final  Culture, blood (routine x 2)     Status: None (Preliminary result)   Collection Time: 03/08/21  2:02 AM   Specimen: BLOOD  Result Value Ref Range Status   Specimen Description BLOOD RIGHT HAND  Final   Special Requests   Final    BOTTLES DRAWN AEROBIC AND ANAEROBIC Blood Culture adequate volume   Culture   Final    NO GROWTH 2 DAYS Performed at [facility], [address], [place], [place] [address]    Report Status PENDING  Incomplete          Radiology Studies: DG Chest Port 1 View  Result Date: 03/09/2021 CLINICAL DATA:  Dyspnea.  Coronary artery disease. EXAM: PORTABLE CHEST 1 VIEW COMPARISON:  11/05/2017 FINDINGS: Mild-to-moderate cardiomegaly is increased since previous study. New diffuse interstitial infiltrates are consistent with interstitial edema. Small layering left pleural effusion is also suspected. IMPRESSION:  Mild congestive heart failure. Electronically Signed   By: Marlaine Hind M.D.   On: 03/09/2021 15:00        Scheduled Meds:  apixaban  2.5 mg Oral BID   doxazosin  8 mg Oral Q12H   feeding supplement  237 mL Oral TID BM   furosemide  40 mg Intravenous BID   insulin aspart  0-15 Units Subcutaneous Q4H   metoprolol succinate  25 mg Oral Daily   multivitamin with minerals  1 tablet Oral Daily   pantoprazole (PROTONIX) IV  40 mg Intravenous Q24H   potassium chloride  40 mEq Oral BID   rosuvastatin  10 mg Oral Daily   traZODone  100 mg Oral QHS   Continuous Infusions:          Aline August, MD [facility] 03/10/2021, 7:47 AM

## 2021-03-10 NOTE — Plan of Care (Signed)

## 2021-03-10 NOTE — Progress Notes (Signed)
Physical Therapy Treatment Patient Details Name: Timothy Riley MRN: [id number] DOB: [birth year] Today's Date: 03/10/2021   History of Present Illness Timothy Riley is a 81 y.o. male with medical history significant of Persistent A-fib on Eliquis, s/p bioprosthetic AVR, G2 DD (EF 50 to 55% 08/2020), diabetes, nonobstructive CAD with low risk Myoview in 2020, HTN, CKD 3B, who presents to the ED with a 2-day history of vomiting diarrhea and abdominal pain, poor oral intake with inability to hold down anything, including meds.    PT Comments    Patient tolerated session well and was agreeable to treatment. Upon arrival patient was sitting EOB with no reports of pain. Pleasant man to work with. Therapeutic exercises focused on BLE strengthening. Sit to stand transfers were completed at supervision and no AD. Patient was able to ambulate 2 laps around the nurses station with no AD at [facility] due to unsteadiness with gait. No major LOB noted. Patient requires intermittent tactile cueing from furniture/objects for stability. Session competed on 2L O2 via [place]. O2 remained >90% throughout session. HR ranged from 73-78bpm throughout session. Patient would continue to benefit from skilled physical therapy in order to optimize patient's return to PLOF. Continue to recommend HHPT upon discharge from acute hospitalization.    Recommendations for follow up therapy are one component of a multi-disciplinary discharge planning process, led by the attending physician.  Recommendations may be updated based on patient status, additional functional criteria and insurance authorization.  Follow Up Recommendations  Home health PT     Assistance Recommended at Discharge Frequent or constant Supervision/Assistance  Patient can return home with the following A little help with walking and/or transfers;A little help with bathing/dressing/bathroom;Assistance with cooking/housework;Assist for transportation;Help with stairs or  ramp for entrance   Equipment Recommendations  None recommended by PT;Other (comment)    Recommendations for Other Services       Precautions / Restrictions Precautions Precautions: Fall Restrictions Weight Bearing Restrictions: No     Mobility  Bed Mobility Overal bed mobility:  (not assessed as patient was sitting EOB upon arrival)                  Transfers Overall transfer level: Needs assistance Equipment used: None Transfers: Sit to/from Stand Sit to Stand: Supervision                Ambulation/Gait Ambulation/Gait assistance: Min guard Gait Distance (Feet): 320 Feet Assistive device: None Gait Pattern/deviations: Step-through pattern, Decreased step length - right, Decreased step length - left, Staggering right, Staggering left Gait velocity: decreased     General Gait Details: patient reaching out for furniture, counter, walls during ambulation in room and hallway. Unsteady however no LOB noted   Stairs             Wheelchair Mobility    Modified Rankin (Stroke Patients Only)       Balance Overall balance assessment: Needs assistance Sitting-balance support: Feet supported Sitting balance-Leahy Scale: Good     Standing balance support: No upper extremity supported, Single extremity supported, During functional activity Standing balance-Leahy Scale: Fair Standing balance comment: unsteady with mobility. Needing at least inermittent single UE support and CGA for safety                            Cognition Arousal/Alertness: Awake/alert Behavior During Therapy: WFL for tasks assessed/performed Overall Cognitive Status: Within Functional Limits for tasks assessed  General Comments: pleasant man to work with        Exercises General Exercises - Lower Extremity Long Arc Quad: Seated, 20 reps, AROM, Both Hip Flexion/Marching: Seated, 20 reps, AROM, Both    General  Comments        Pertinent Vitals/Pain Pain Assessment Pain Assessment: No/denies pain Pain Intervention(s): Limited activity within patient's tolerance, Monitored during session, Repositioned    Home Living                          Prior Function            PT Goals (current goals can now be found in the care plan section) Acute Rehab PT Goals Patient Stated Goal: none stated Time For Goal Achievement: 03/22/21    Frequency    Min 2X/week      PT Plan      Co-evaluation              AM-PAC PT "6 Clicks" Mobility   Outcome Measure  Help needed turning from your back to your side while in a flat bed without using bedrails?: A Little Help needed moving from lying on your back to sitting on the side of a flat bed without using bedrails?: A Little Help needed moving to and from a bed to a chair (including a wheelchair)?: A Little Help needed standing up from a chair using your arms (e.g., wheelchair or bedside chair)?: A Little Help needed to walk in hospital room?: A Little Help needed climbing 3-5 steps with a railing? : A Lot 6 Click Score: 17    End of Session Equipment Utilized During Treatment: Gait belt Activity Tolerance: Patient tolerated treatment well Patient left: in bed;with call bell/phone within reach Nurse Communication: Mobility status PT Visit Diagnosis: Unsteadiness on feet (R26.81);Muscle weakness (generalized) (M62.81);Difficulty in walking, not elsewhere classified (R26.2)     Time: 4098-1191 PT Time Calculation (min) (ACUTE ONLY): 22 min  Charges:  $Gait Training: 8-22 mins                     Iva Boop, PT  03/10/21. 12:27 PM

## 2021-03-10 NOTE — Progress Notes (Signed)
Progress Note  Patient Name: Timothy Riley [facility] Date of Encounter: 03/10/2021  Primary Cardiologist: Rockey Situ  Subjective   Dyspnea and lower extremity swelling are improving.  No angina.  Documented UOP 766 mL for the past 24 hours, net - 1.4 L for the admission. Renal function stable.   Inpatient Medications    Scheduled Meds:  apixaban  2.5 mg Oral BID   doxazosin  8 mg Oral Q12H   feeding supplement  237 mL Oral TID BM   furosemide  40 mg Intravenous BID   insulin aspart  0-15 Units Subcutaneous Q4H   metoprolol succinate  25 mg Oral Daily   multivitamin with minerals  1 tablet Oral Daily   pantoprazole (PROTONIX) IV  40 mg Intravenous Q24H   potassium chloride  40 mEq Oral BID   rosuvastatin  10 mg Oral Daily   traZODone  100 mg Oral QHS   Continuous Infusions:  PRN Meds: acetaminophen **OR** acetaminophen, ondansetron **OR** ondansetron (ZOFRAN) IV   Vital Signs    Vitals:   03/09/21 1957 03/10/21 0424 03/10/21 0500 03/10/21 0901  BP: (!) 149/64 (!) 142/62  (!) 160/69  Pulse: 71 72  72  Resp: 16 16  18   Temp: 98.3 F (36.8 C) 97.6 F (36.4 C)  98.2 F (36.8 C)  TempSrc:    Oral  SpO2: 99% 97%  97%  Weight:   92.3 kg   Height:        Intake/Output Summary (Last 24 hours) at 03/10/2021 1552 Last data filed at 03/10/2021 1419 Gross per 24 hour  Intake 1103.42 ml  Output 3100 ml  Net -1996.58 ml   Filed Weights   03/08/21 0800 03/09/21 0500 03/10/21 0500  Weight: 94.1 kg 93.7 kg 92.3 kg    Telemetry    Not on telemetry - Personally Reviewed  ECG    No new tracings - Personally Reviewed  Physical Exam   GEN: No acute distress.   Neck: No JVD. Cardiac: RRR, no murmurs, rubs, or gallops.  Respiratory: Diminished breath sounds along the bases bilaterally.  GI: Soft, nontender, non-distended.   MS: Trivial pretibial edema; No deformity. Neuro:  Alert and oriented x 3; Nonfocal.  Psych: Normal affect.  Labs    Chemistry Recent Labs  Lab  03/07/21 2207 03/09/21 0320 03/10/21 0442  NA 139 139 139  K 3.5 3.2* 4.1  CL 101 105 104  CO2 27 29 31   GLUCOSE 166* 120* 107*  BUN 22 26* 29*  CREATININE 1.68* 1.96* 1.87*  CALCIUM 9.2 8.0* 8.3*  PROT 7.0 5.4*  --   ALBUMIN 3.9 3.0*  --   AST 27 19  --   ALT 12 8  --   ALKPHOS 66 47  --   BILITOT 1.5* 0.8  --   GFRNONAA 40* 34* 35*  ANIONGAP 11 5 4*     Hematology Recent Labs  Lab 03/07/21 2207 03/09/21 0320 03/10/21 0442  WBC 4.7 3.9* 4.5  RBC 4.05* 3.13* 3.28*  HGB 11.6* 9.0* 9.3*  HCT 37.5* 29.3* 30.5*  MCV 92.6 93.6 93.0  MCH 28.6 28.8 28.4  MCHC 30.9 30.7 30.5  RDW 14.6 15.1 14.9  PLT 157 140* 140*    Cardiac EnzymesNo results for input(s): TROPONINI in the last 168 hours. No results for input(s): TROPIPOC in the last 168 hours.   BNPNo results for input(s): BNP, PROBNP in the last 168 hours.   DDimer No results for input(s): DDIMER in the last  168 hours.   Radiology    DG Chest Port 1 View  Result Date: 03/09/2021 IMPRESSION: Mild congestive heart failure. Electronically Signed   By: Marlaine Hind M.D.   On: 03/09/2021 15:00    Cardiac Studies   2D echo 09/13/2020: 1. Left ventricular ejection fraction, by estimation, is 50 to 55%. The  left ventricle has low normal function. Left ventricular endocardial  border not optimally defined to evaluate regional wall motion. There is  mild left ventricular hypertrophy. Left  ventricular diastolic parameters are consistent with Grade II diastolic  dysfunction (pseudonormalization). Elevated left atrial pressure.   2. Right ventricular systolic function is normal. The right ventricular  size is normal. There is moderately elevated pulmonary artery systolic  pressure.   3. Left atrial size was moderately dilated.   4. Right atrial size was mildly dilated.   5. The mitral valve is abnormal. Mild mitral valve regurgitation. No  evidence of mitral stenosis.   6. Tricuspid valve regurgitation is mild to  moderate.   7. The aortic valve has been repaired/replaced. Aortic valve  regurgitation is mild to moderate. There is a 23 mm Edwards bioprosthetic  valve present in the aortic position. Procedure Date: 03/01/14. Aortic valve  mean gradient measures 13.0 mmHg.   8. The inferior vena cava is dilated in size with >50% respiratory  variability, suggesting right atrial pressure of 8 mmHg.   Patient Profile     81 y.o. male with history of HFpEF, pulmonary hypertension, persistent Afib, severe aortic stenosis s/p bioprosthetic AVR in 03/2014 at [facility], DM2, and BPH who we are seeing for lower extremity swelling.   Assessment & Plan    1. Acute on chronic HFpEF/pulmonary hypertension: -Volume status is improving, though he does remain volume up -Possibly in the setting of medication nonadherence at home -Ins and outs appear inaccurate as patient reports he has filled 4, almost 5 urinals over the past 24 hours -Continue IV Lasix 40 mg twice daily -Recommend daily standing scale weights and strict I's and O's  2. Persistent Afib: -Sinus rhythm upon admission, not currently on telemetry -CHADS2VASc at least 4 (CHF, age x 2, DM2) -PTA apixaban 2.5 mg twice daily given age and serum creatinine -Toprol-XL  3. Severe aortic stenosis s/p bioprosthetic AVR: -Periodic outpatient echo  Remaining noncardiac issues per primary service     For questions or updates, please contact [facility] Please consult [email or web address] for contact info under Cardiology/STEMI.    Signed, Christell Faith, PA-C [place] Pager: [phone] 03/10/2021, 3:52 PM

## 2021-03-11 LAB — GLUCOSE, CAPILLARY
Glucose-Capillary: 101 mg/dL — ABNORMAL HIGH (ref 70–99)
Glucose-Capillary: 105 mg/dL — ABNORMAL HIGH (ref 70–99)
Glucose-Capillary: 127 mg/dL — ABNORMAL HIGH (ref 70–99)
Glucose-Capillary: 143 mg/dL — ABNORMAL HIGH (ref 70–99)
Glucose-Capillary: 89 mg/dL (ref 70–99)
Glucose-Capillary: 92 mg/dL (ref 70–99)

## 2021-03-11 LAB — BASIC METABOLIC PANEL
Anion gap: 9 (ref 5–15)
BUN: 29 mg/dL — ABNORMAL HIGH (ref 8–23)
CO2: 31 mmol/L (ref 22–32)
Calcium: 8.7 mg/dL — ABNORMAL LOW (ref 8.9–10.3)
Chloride: 101 mmol/L (ref 98–111)
Creatinine, Ser: 1.68 mg/dL — ABNORMAL HIGH (ref 0.61–1.24)
GFR, Estimated: 40 mL/min — ABNORMAL LOW (ref >60)
Glucose, Bld: 92 mg/dL (ref 70–99)
Potassium: 4.1 mmol/L (ref 3.5–5.1)
Sodium: 141 mmol/L (ref 135–145)

## 2021-03-11 LAB — MAGNESIUM: Magnesium: 2 mg/dL (ref 1.7–2.4)

## 2021-03-11 NOTE — Progress Notes (Signed)
PROGRESS NOTE    Timothy Riley  FVC:[id number] DOB: October 11, 1940 DOA: 03/07/2021 PCP: Derinda Late, MD   Brief Narrative:  81 year old male with history of persistent A-fib on Eliquis, status post bioprosthetic AVR, grade 2 diastolic dysfunction (EF of 50 to 55% in 08/2020, diabetes mellitus type 2, nonobstructive CAD, hypertension, CKD stage IIIb presented with vomiting, diarrhea, abdominal pain and poor oral intake.  On presentation, he was afebrile with normal WBCs and lactic acid with creatinine of 1.68.  CT of the abdomen and pelvis showed bilateral pleural effusions, small volume abdominal and pelvic ascites with edema around the kidneys, no bowel obstruction, cholelithiasis without acute cholecystitis and mild splenic enlargement.  He was started on IV fluids.  Subsequently, IV fluids discontinued on 03/09/2021 because of dyspnea and lower extremity swelling.  Cardiology was consulted on 03/09/2021.  Assessment & Plan:  Acute on chronic diastolic CHF -Lasix held on presentation.  Strict input and output.  Daily weights.  Negative balance of 3570.5 cc since admission. -Cardiology following.  Currently on Lasix 40 mg IV twice daily.  Continue metoprolol succinate.  Possible acute gastroenteritis -Suspect viral. -Diarrhea has resolved.  DC'd stool testing. -Tolerating diet.  IV discontinued on 03/09/2021.  Continue Protonix and antiemetics as needed  Acute kidney injury on chronic kidney disease stage IIIb -Creatinine improving to 1.68 today.  Baseline creatinine of around 1.5-1.6. Repeat a.m. labs.  Diuretic plan as above.  Persistent A-fib -Currently rate controlled.  Continue metoprolol succinate and Eliquis  Diabetes mellitus type 2 -A1c 5.4.  Continue CBGs with SSI  Thrombocytopenia -Questionable cause.  No signs of bleeding.  Anemia of chronic disease -Possibly from kidney disease.  Hemoglobin stable  Hypokalemia -Resolved  BPH -Continue doxazosin  CAD -Stable.  No  chest pain.  Continue metoprolol and statin  Hyperlipidemia Continue statin  History of AVR -Outpatient follow-up with cardiology   DVT prophylaxis: Eliquis Code Status: Full Family Communication: Spoke to son on phone on 03/09/2021 Disposition Plan: Status is: inpatient because: Of need for IV Lasix for fluid overload   Consultants: Cardiology  Procedures: None  Antimicrobials: None   Subjective: Patient seen and examined at bedside.  Hard of hearing.  Still feels weak but overall feels much better with decreasing leg swelling.  Denies worsening chest pain, nausea, vomiting or abdominal pain.   Objective: Vitals:   03/10/21 1616 03/10/21 2031 03/11/21 0420 03/11/21 0500  BP: (!) 147/66 137/67 (!) 164/66   Pulse: 67 65 70   Resp: 18 18 16    Temp: 98.1 F (36.7 C) 98.9 F (37.2 C) 97.6 F (36.4 C)   TempSrc:      SpO2: 99% 98% 94%   Weight:    93.5 kg  Height:        Intake/Output Summary (Last 24 hours) at 03/11/2021 0731 Last data filed at 03/11/2021 0333 Gross per 24 hour  Intake 240 ml  Output 3725 ml  Net -3485 ml    Filed Weights   03/09/21 0500 03/10/21 0500 03/11/21 0500  Weight: 93.7 kg 92.3 kg 93.5 kg    Examination:  General exam: No distress.  On 2 L oxygen by nasal cannula.  Extremely hard of hearing.  Elderly male lying in bed. Respiratory system: Bilateral decreased breath sounds at bases with basilar crackles  cardiovascular system: S1-S2 heard; rate controlled  gastrointestinal system: Abdomen is mildly distended; soft and nontender.  Normal bowel sounds heard extremities: No clubbing; mild lower extremity edema present  Central nervous system: Alert,  still slow to respond, poor historian.  No focal neurological deficits.  Moving extremities skin: No obvious petechiae/rashes psychiatry: Currently not agitated.  Flat affect.   Data Reviewed: I have personally reviewed following labs and imaging studies  CBC: Recent Labs  Lab  03/07/21 2207 03/09/21 0320 03/10/21 0442  WBC 4.7 3.9* 4.5  NEUTROABS  --  2.3 3.0  HGB 11.6* 9.0* 9.3*  HCT 37.5* 29.3* 30.5*  MCV 92.6 93.6 93.0  PLT 157 140* 140*    Basic Metabolic Panel: Recent Labs  Lab 03/07/21 2207 03/09/21 0320 03/10/21 0442 03/11/21 0415  NA 139 139 139 141  K 3.5 3.2* 4.1 4.1  CL 101 105 104 101  CO2 27 29 31 31   GLUCOSE 166* 120* 107* 92  BUN 22 26* 29* 29*  CREATININE 1.68* 1.96* 1.87* 1.68*  CALCIUM 9.2 8.0* 8.3* 8.7*  MG  --  2.0 2.0 2.0    GFR: Estimated Creatinine Clearance: 35.6 mL/min (A) (by C-G formula based on SCr of 1.68 mg/dL (H)). Liver Function Tests: Recent Labs  Lab 03/07/21 2207 03/09/21 0320  AST 27 19  ALT 12 8  ALKPHOS 66 47  BILITOT 1.5* 0.8  PROT 7.0 5.4*  ALBUMIN 3.9 3.0*    Recent Labs  Lab 03/07/21 2207  LIPASE 27    No results for input(s): AMMONIA in the last 168 hours. Coagulation Profile: No results for input(s): INR, PROTIME in the last 168 hours. Cardiac Enzymes: No results for input(s): CKTOTAL, CKMB, CKMBINDEX, TROPONINI in the last 168 hours. BNP (last 3 results) No results for input(s): PROBNP in the last 8760 hours. HbA1C: No results for input(s): HGBA1C in the last 72 hours.  CBG: Recent Labs  Lab 03/10/21 1204 03/10/21 1656 03/10/21 2028 03/11/21 0003 03/11/21 0330  GLUCAP 151* 171* 96 89 92    Lipid Profile: No results for input(s): CHOL, HDL, LDLCALC, TRIG, CHOLHDL, LDLDIRECT in the last 72 hours. Thyroid Function Tests: No results for input(s): TSH, T4TOTAL, FREET4, T3FREE, THYROIDAB in the last 72 hours. Anemia Panel: No results for input(s): VITAMINB12, FOLATE, FERRITIN, TIBC, IRON, RETICCTPCT in the last 72 hours. Sepsis Labs: Recent Labs  Lab 03/08/21 0107 03/08/21 0202  LATICACIDVEN 1.1 0.8     Recent Results (from the past 240 hour(s))  Resp Panel by RT-PCR (Flu A&B, Covid) Nasopharyngeal Swab     Status: None   Collection Time: 03/07/21 11:21 PM    Specimen: Nasopharyngeal Swab; Nasopharyngeal(NP) swabs in vial transport medium  Result Value Ref Range Status   SARS Coronavirus 2 by RT PCR NEGATIVE NEGATIVE Final    Comment: (NOTE) SARS-CoV-2 target nucleic acids are NOT DETECTED.  The SARS-CoV-2 RNA is generally detectable in upper respiratory specimens during the acute phase of infection. The lowest concentration of SARS-CoV-2 viral copies this assay can detect is 138 copies/mL. A negative result does not preclude SARS-Cov-2 infection and should not be used as the sole basis for treatment or other patient management decisions. A negative result may occur with  improper specimen collection/handling, submission of specimen other than nasopharyngeal swab, presence of viral mutation(s) within the areas targeted by this assay, and inadequate number of viral copies(<138 copies/mL). A negative result must be combined with clinical observations, patient history, and epidemiological information. The expected result is Negative.  Fact Sheet for Patients:  EntrepreneurPulse.com.au  Fact Sheet for Healthcare Providers:  IncredibleEmployment.be  This test is no t yet approved or cleared by the [place] and  has been authorized  for detection and/or diagnosis of SARS-CoV-2 by FDA under an Emergency Use Authorization (EUA). This EUA will remain  in effect (meaning this test can be used) for the duration of the COVID-19 declaration under Section 564(b)(1) of the Act, 21 U.S.C.section 360bbb-3(b)(1), unless the authorization is terminated  or revoked sooner.       Influenza A by PCR NEGATIVE NEGATIVE Final   Influenza B by PCR NEGATIVE NEGATIVE Final    Comment: (NOTE) The Xpert Xpress SARS-CoV-2/FLU/RSV plus assay is intended as an aid in the diagnosis of influenza from Nasopharyngeal swab specimens and should not be used as a sole basis for treatment. Nasal washings and aspirates are  unacceptable for Xpert Xpress SARS-CoV-2/FLU/RSV testing.  Fact Sheet for Patients: EntrepreneurPulse.com.au  Fact Sheet for Healthcare Providers: IncredibleEmployment.be  This test is not yet approved or cleared by the [place] FDA and has been authorized for detection and/or diagnosis of SARS-CoV-2 by FDA under an Emergency Use Authorization (EUA). This EUA will remain in effect (meaning this test can be used) for the duration of the COVID-19 declaration under Section 564(b)(1) of the Act, 21 U.S.C. section 360bbb-3(b)(1), unless the authorization is terminated or revoked.  Performed at [facility], [address] [place], [place] [address]   Culture, blood (routine x 2)     Status: None (Preliminary result)   Collection Time: 03/08/21  1:07 AM   Specimen: BLOOD  Result Value Ref Range Status   Specimen Description BLOOD RIGHT HAND  Final   Special Requests   Final    BOTTLES DRAWN AEROBIC AND ANAEROBIC Blood Culture results may not be optimal due to an inadequate volume of blood received in culture bottles   Culture   Final    NO GROWTH 3 DAYS Performed at [facility], [address] [place], [place] [address]    Report Status PENDING  Incomplete  Urine Culture     Status: Abnormal   Collection Time: 03/08/21  1:17 AM   Specimen: Urine, Clean Catch  Result Value Ref Range Status   Specimen Description   Final    URINE, CLEAN CATCH Performed at [facility], [address] [place], [place] [address]    Special Requests   Final    NONE Performed at [facility], [address] [place], [place] [address]    Culture (A)  Final    20,000 COLONIES/mL STREPTOCOCCUS AGALACTIAE TESTING AGAINST S. AGALACTIAE NOT ROUTINELY PERFORMED DUE TO PREDICTABILITY OF AMP/PEN/VAN SUSCEPTIBILITY. Performed at [facility], [place], [place], [place] [address]    Report Status 03/09/2021  FINAL  Final  Culture, blood (routine x 2)     Status: None (Preliminary result)   Collection Time: 03/08/21  2:02 AM   Specimen: BLOOD  Result Value Ref Range Status   Specimen Description BLOOD RIGHT HAND  Final   Special Requests   Final    BOTTLES DRAWN AEROBIC AND ANAEROBIC Blood Culture adequate volume   Culture   Final    NO GROWTH 3 DAYS Performed at [facility], [address], [place],  [address]    Report Status PENDING  Incomplete          Radiology Studies: DG Chest Port 1 View  Result Date: 03/09/2021 CLINICAL DATA:  Dyspnea.  Coronary artery disease. EXAM: PORTABLE CHEST 1 VIEW COMPARISON:  11/05/2017 FINDINGS: Mild-to-moderate cardiomegaly is increased since previous study. New diffuse interstitial infiltrates are consistent with interstitial edema. Small layering  left pleural effusion is also suspected. IMPRESSION: Mild congestive heart failure. Electronically Signed   By: Marlaine Hind M.D.   On: 03/09/2021 15:00        Scheduled Meds:  apixaban  2.5 mg Oral BID   doxazosin  8 mg Oral Q12H   feeding supplement  237 mL Oral TID BM   furosemide  40 mg Intravenous BID   insulin aspart  0-15 Units Subcutaneous Q4H   metoprolol succinate  25 mg Oral Daily   multivitamin with minerals  1 tablet Oral Daily   pantoprazole (PROTONIX) IV  40 mg Intravenous Q24H   potassium chloride  40 mEq Oral BID   rosuvastatin  10 mg Oral Daily   traZODone  100 mg Oral QHS   Continuous Infusions:          Aline August, MD [facility] 03/11/2021, 7:31 AM

## 2021-03-11 NOTE — Progress Notes (Signed)
Progress Note  Patient Name: Timothy Riley [facility] Date of Encounter: 03/11/2021  Primary Cardiologist: Rockey Situ  Subjective   Dyspnea and lower extremity swelling are improving.  No angina.  Documented UOP 2.4 L for the past 24 hours, net - 3.5 L for the admission. Renal function stable.   Inpatient Medications    Scheduled Meds:  apixaban  2.5 mg Oral BID   doxazosin  8 mg Oral Q12H   feeding supplement  237 mL Oral TID BM   furosemide  40 mg Intravenous BID   insulin aspart  0-15 Units Subcutaneous Q4H   metoprolol succinate  25 mg Oral Daily   multivitamin with minerals  1 tablet Oral Daily   pantoprazole (PROTONIX) IV  40 mg Intravenous Q24H   potassium chloride  40 mEq Oral BID   rosuvastatin  10 mg Oral Daily   traZODone  100 mg Oral QHS   Continuous Infusions:  PRN Meds: acetaminophen **OR** acetaminophen, ondansetron **OR** ondansetron (ZOFRAN) IV   Vital Signs    Vitals:   03/10/21 1616 03/10/21 2031 03/11/21 0420 03/11/21 0500  BP: (!) 147/66 137/67 (!) 164/66   Pulse: 67 65 70   Resp: 18 18 16    Temp: 98.1 F (36.7 C) 98.9 F (37.2 C) 97.6 F (36.4 C)   TempSrc:      SpO2: 99% 98% 94%   Weight:    93.5 kg  Height:        Intake/Output Summary (Last 24 hours) at 03/11/2021 0721 Last data filed at 03/11/2021 3557 Gross per 24 hour  Intake 240 ml  Output 3725 ml  Net -3485 ml    Filed Weights   03/09/21 0500 03/10/21 0500 03/11/21 0500  Weight: 93.7 kg 92.3 kg 93.5 kg    Telemetry    Not on telemetry - Personally Reviewed  ECG    No new tracings - Personally Reviewed  Physical Exam   GEN: No acute distress.   Neck: No JVD. Cardiac: RRR, no murmurs, rubs, or gallops.  Respiratory: Diminished breath sounds along the bases bilaterally.  GI: Soft, nontender, non-distended.   MS: Trivial pretibial edema; No deformity. Neuro:  Alert and oriented x 3; Nonfocal.  Psych: Normal affect.  Labs    Chemistry Recent Labs  Lab 03/07/21 2207  03/09/21 0320 03/10/21 0442 03/11/21 0415  NA 139 139 139 141  K 3.5 3.2* 4.1 4.1  CL 101 105 104 101  CO2 27 29 31 31   GLUCOSE 166* 120* 107* 92  BUN 22 26* 29* 29*  CREATININE 1.68* 1.96* 1.87* 1.68*  CALCIUM 9.2 8.0* 8.3* 8.7*  PROT 7.0 5.4*  --   --   ALBUMIN 3.9 3.0*  --   --   AST 27 19  --   --   ALT 12 8  --   --   ALKPHOS 66 47  --   --   BILITOT 1.5* 0.8  --   --   GFRNONAA 40* 34* 35* 40*  ANIONGAP 11 5 4* 9      Hematology Recent Labs  Lab 03/07/21 2207 03/09/21 0320 03/10/21 0442  WBC 4.7 3.9* 4.5  RBC 4.05* 3.13* 3.28*  HGB 11.6* 9.0* 9.3*  HCT 37.5* 29.3* 30.5*  MCV 92.6 93.6 93.0  MCH 28.6 28.8 28.4  MCHC 30.9 30.7 30.5  RDW 14.6 15.1 14.9  PLT 157 140* 140*     Cardiac EnzymesNo results for input(s): TROPONINI in the last 168 hours. No results  for input(s): TROPIPOC in the last 168 hours.   BNPNo results for input(s): BNP, PROBNP in the last 168 hours.   DDimer No results for input(s): DDIMER in the last 168 hours.   Radiology    DG Chest Port 1 View  Result Date: 03/09/2021 IMPRESSION: Mild congestive heart failure. Electronically Signed   By: Marlaine Hind M.D.   On: 03/09/2021 15:00    Cardiac Studies   2D echo 09/13/2020: 1. Left ventricular ejection fraction, by estimation, is 50 to 55%. The  left ventricle has low normal function. Left ventricular endocardial  border not optimally defined to evaluate regional wall motion. There is  mild left ventricular hypertrophy. Left  ventricular diastolic parameters are consistent with Grade II diastolic  dysfunction (pseudonormalization). Elevated left atrial pressure.   2. Right ventricular systolic function is normal. The right ventricular  size is normal. There is moderately elevated pulmonary artery systolic  pressure.   3. Left atrial size was moderately dilated.   4. Right atrial size was mildly dilated.   5. The mitral valve is abnormal. Mild mitral valve regurgitation. No  evidence  of mitral stenosis.   6. Tricuspid valve regurgitation is mild to moderate.   7. The aortic valve has been repaired/replaced. Aortic valve  regurgitation is mild to moderate. There is a 23 mm Edwards bioprosthetic  valve present in the aortic position. Procedure Date: 03/01/14. Aortic valve  mean gradient measures 13.0 mmHg.   8. The inferior vena cava is dilated in size with >50% respiratory  variability, suggesting right atrial pressure of 8 mmHg.   Patient Profile     81 y.o. male with history of HFpEF, pulmonary hypertension, persistent Afib, severe aortic stenosis s/p bioprosthetic AVR in 03/2014 at [facility], DM2, and BPH who we are seeing for lower extremity swelling.   Assessment & Plan    1. Acute on chronic HFpEF/pulmonary hypertension: -Volume status is improving, though he does remain volume up -Possibly in the setting of medication nonadherence at home -Ins and outs appear inaccurate as patient reports he has filled 4, almost 5 urinals over the past 24 hours -Continue IV Lasix 40 mg twice daily until there is evidence of intravascular volume depletion  -Recommend daily standing scale weights and strict I's and O's  2. Persistent Afib: -Sinus rhythm upon admission, not currently on telemetry -CHADS2VASc at least 4 (CHF, age x 2, DM2) -PTA apixaban 2.5 mg twice daily given age and serum creatinine -Toprol-XL  3. Severe aortic stenosis s/p bioprosthetic AVR: -Periodic outpatient echo  Remaining noncardiac issues per primary service     For questions or updates, please contact [facility] Please consult [email or web address] for contact info under Cardiology/STEMI.    Signed, Christell Faith, PA-C [facility] Pager: [phone] 03/11/2021, 7:21 AM

## 2021-03-12 DIAGNOSIS — I10 Essential (primary) hypertension: Secondary | ICD-10-CM | POA: Diagnosis not present

## 2021-03-12 DIAGNOSIS — I25118 Atherosclerotic heart disease of native coronary artery with other forms of angina pectoris: Secondary | ICD-10-CM

## 2021-03-12 DIAGNOSIS — I5033 Acute on chronic diastolic (congestive) heart failure: Secondary | ICD-10-CM | POA: Diagnosis not present

## 2021-03-12 DIAGNOSIS — I4891 Unspecified atrial fibrillation: Secondary | ICD-10-CM | POA: Diagnosis not present

## 2021-03-12 LAB — BASIC METABOLIC PANEL
Anion gap: 9 (ref 5–15)
BUN: 31 mg/dL — ABNORMAL HIGH (ref 8–23)
CO2: 35 mmol/L — ABNORMAL HIGH (ref 22–32)
Calcium: 9 mg/dL (ref 8.9–10.3)
Chloride: 96 mmol/L — ABNORMAL LOW (ref 98–111)
Creatinine, Ser: 1.83 mg/dL — ABNORMAL HIGH (ref 0.61–1.24)
GFR, Estimated: 36 mL/min — ABNORMAL LOW (ref >60)
Glucose, Bld: 79 mg/dL (ref 70–99)
Potassium: 3.7 mmol/L (ref 3.5–5.1)
Sodium: 140 mmol/L (ref 135–145)

## 2021-03-12 LAB — GLUCOSE, CAPILLARY
Glucose-Capillary: 116 mg/dL — ABNORMAL HIGH (ref 70–99)
Glucose-Capillary: 142 mg/dL — ABNORMAL HIGH (ref 70–99)
Glucose-Capillary: 150 mg/dL — ABNORMAL HIGH (ref 70–99)
Glucose-Capillary: 178 mg/dL — ABNORMAL HIGH (ref 70–99)
Glucose-Capillary: 86 mg/dL (ref 70–99)
Glucose-Capillary: 96 mg/dL (ref 70–99)

## 2021-03-12 LAB — MAGNESIUM: Magnesium: 2 mg/dL (ref 1.7–2.4)

## 2021-03-12 MED ORDER — PANTOPRAZOLE SODIUM 40 MG PO TBEC
40.0000 mg | DELAYED_RELEASE_TABLET | Freq: Every day | ORAL | Status: DC
  Administered 2021-03-13 – 2021-03-15 (×3): 40 mg via ORAL
  Filled 2021-03-12 (×3): qty 1

## 2021-03-12 NOTE — Progress Notes (Signed)
PROGRESS NOTE    Timothy Riley  WYO:[id number] DOB: July 13, 1940 DOA: 03/07/2021 PCP: Derinda Late, MD   Brief Narrative:  81 year old male with history of persistent A-fib on Eliquis, status post bioprosthetic AVR, grade 2 diastolic dysfunction (EF of 50 to 55% in 08/2020, diabetes mellitus type 2, nonobstructive CAD, hypertension, CKD stage IIIb presented with vomiting, diarrhea, abdominal pain and poor oral intake.  On presentation, he was afebrile with normal WBCs and lactic acid with creatinine of 1.68.  CT of the abdomen and pelvis showed bilateral pleural effusions, small volume abdominal and pelvic ascites with edema around the kidneys, no bowel obstruction, cholelithiasis without acute cholecystitis and mild splenic enlargement.  He was started on IV fluids.  Subsequently, IV fluids discontinued on 03/09/2021 because of dyspnea and lower extremity swelling.  Cardiology was consulted on 03/09/2021.  Assessment & Plan:  Acute on chronic diastolic CHF -Lasix held on presentation.  Strict input and output.  Daily weights.  Negative balance of 5550.5 cc since admission. -Cardiology following.  Currently on Lasix 40 mg IV twice daily.  Continue metoprolol succinate.  Possible acute gastroenteritis -Suspect viral. -Diarrhea has resolved.  DC'd stool testing. -Tolerating diet.  IV discontinued on 03/09/2021.  Continue Protonix and antiemetics as needed  Acute kidney injury on chronic kidney disease stage IIIb -Creatinine 1.83 today.  Baseline creatinine of around 1.5-1.6. Repeat a.m. labs.  Diuretic plan as above.  Persistent A-fib -Currently rate controlled.  Continue metoprolol succinate and Eliquis  Diabetes mellitus type 2 -A1c 5.4.  Continue CBGs with SSI  Thrombocytopenia -Questionable cause.  No signs of bleeding.  Anemia of chronic disease -Possibly from kidney disease.  Hemoglobin stable  Hypokalemia -Resolved  BPH -Continue doxazosin  CAD -Stable.  No chest pain.   Continue metoprolol and statin  Hyperlipidemia Continue statin  History of AVR -Outpatient follow-up with cardiology   DVT prophylaxis: Eliquis Code Status: Full Family Communication: Spoke to son on phone on 03/09/2021 Disposition Plan: Status is: inpatient because: Of need for IV Lasix for fluid overload   Consultants: Cardiology  Procedures: None  Antimicrobials: None   Subjective: Patient seen and examined at bedside.  Hard of hearing.  Denies worsening chest pain, vomiting, fever.  Still mildly short of breath with exertion. Objective: Vitals:   03/12/21 0042 03/12/21 0044 03/12/21 0352 03/12/21 0733  BP: (!) 151/74 (!) 151/74 131/76 (!) 158/76  Pulse: 75 75 68 73  Resp:  16 16 16   Temp:  77.4 F (37 C) 98.5 F (36.9 C) 98.3 F (36.8 C)  TempSrc:  Oral    SpO2: 98% 94% 99% 99%  Weight:      Height:        Intake/Output Summary (Last 24 hours) at 03/12/2021 0754 Last data filed at 03/12/2021 0018 Gross per 24 hour  Intake 720 ml  Output 2700 ml  Net -1980 ml    Filed Weights   03/09/21 0500 03/10/21 0500 03/11/21 0500  Weight: 93.7 kg 92.3 kg 93.5 kg    Examination:  General exam: On room air currently.  No acute distress.  Extremely hard of hearing.  Elderly male lying in bed. Respiratory system: Decreased breath sounds at bases bilaterally with bibasilar crackles  cardiovascular system: Rate controlled currently; S1-S2 heard gastrointestinal system: Abdomen is distended slightly; soft and nontender.  Bowel sounds are heard extremities: Bilateral lower extremity edema present; no cyanosis Central nervous system: Slow to respond; awake; poor historian.  No focal neurological deficits.  Moves extremities skin: No  obvious ecchymosis/lesions  psychiatry: Affect is flat.  No signs of agitation currently.   Data Reviewed: I have personally reviewed following labs and imaging studies  CBC: Recent Labs  Lab 03/07/21 2207 03/09/21 0320 03/10/21 0442   WBC 4.7 3.9* 4.5  NEUTROABS  --  2.3 3.0  HGB 11.6* 9.0* 9.3*  HCT 37.5* 29.3* 30.5*  MCV 92.6 93.6 93.0  PLT 157 140* 140*    Basic Metabolic Panel: Recent Labs  Lab 03/07/21 2207 03/09/21 0320 03/10/21 0442 03/11/21 0415 03/12/21 0358  NA 139 139 139 141 140  K 3.5 3.2* 4.1 4.1 3.7  CL 101 105 104 101 96*  CO2 27 29 31 31  35*  GLUCOSE 166* 120* 107* 92 79  BUN 22 26* 29* 29* 31*  CREATININE 1.68* 1.96* 1.87* 1.68* 1.83*  CALCIUM 9.2 8.0* 8.3* 8.7* 9.0  MG  --  2.0 2.0 2.0 2.0    GFR: Estimated Creatinine Clearance: 32.7 mL/min (A) (by C-G formula based on SCr of 1.83 mg/dL (H)). Liver Function Tests: Recent Labs  Lab 03/07/21 2207 03/09/21 0320  AST 27 19  ALT 12 8  ALKPHOS 66 47  BILITOT 1.5* 0.8  PROT 7.0 5.4*  ALBUMIN 3.9 3.0*    Recent Labs  Lab 03/07/21 2207  LIPASE 27    No results for input(s): AMMONIA in the last 168 hours. Coagulation Profile: No results for input(s): INR, PROTIME in the last 168 hours. Cardiac Enzymes: No results for input(s): CKTOTAL, CKMB, CKMBINDEX, TROPONINI in the last 168 hours. BNP (last 3 results) No results for input(s): PROBNP in the last 8760 hours. HbA1C: No results for input(s): HGBA1C in the last 72 hours.  CBG: Recent Labs  Lab 03/11/21 1617 03/11/21 2136 03/12/21 0024 03/12/21 0551 03/12/21 0734  GLUCAP 143* 105* 178* 86 96    Lipid Profile: No results for input(s): CHOL, HDL, LDLCALC, TRIG, CHOLHDL, LDLDIRECT in the last 72 hours. Thyroid Function Tests: No results for input(s): TSH, T4TOTAL, FREET4, T3FREE, THYROIDAB in the last 72 hours. Anemia Panel: No results for input(s): VITAMINB12, FOLATE, FERRITIN, TIBC, IRON, RETICCTPCT in the last 72 hours. Sepsis Labs: Recent Labs  Lab 03/08/21 0107 03/08/21 0202  LATICACIDVEN 1.1 0.8     Recent Results (from the past 240 hour(s))  Resp Panel by RT-PCR (Flu A&B, Covid) Nasopharyngeal Swab     Status: None   Collection Time: 03/07/21 11:21  PM   Specimen: Nasopharyngeal Swab; Nasopharyngeal(NP) swabs in vial transport medium  Result Value Ref Range Status   SARS Coronavirus 2 by RT PCR NEGATIVE NEGATIVE Final    Comment: (NOTE) SARS-CoV-2 target nucleic acids are NOT DETECTED.  The SARS-CoV-2 RNA is generally detectable in upper respiratory specimens during the acute phase of infection. The lowest concentration of SARS-CoV-2 viral copies this assay can detect is 138 copies/mL. A negative result does not preclude SARS-Cov-2 infection and should not be used as the sole basis for treatment or other patient management decisions. A negative result may occur with  improper specimen collection/handling, submission of specimen other than nasopharyngeal swab, presence of viral mutation(s) within the areas targeted by this assay, and inadequate number of viral copies(<138 copies/mL). A negative result must be combined with clinical observations, patient history, and epidemiological information. The expected result is Negative.  Fact Sheet for Patients:  EntrepreneurPulse.com.au  Fact Sheet for Healthcare Providers:  IncredibleEmployment.be  This test is no t yet approved or cleared by the [place] and  has been authorized for  detection and/or diagnosis of SARS-CoV-2 by FDA under an Emergency Use Authorization (EUA). This EUA will remain  in effect (meaning this test can be used) for the duration of the COVID-19 declaration under Section 564(b)(1) of the Act, 21 U.S.C.section 360bbb-3(b)(1), unless the authorization is terminated  or revoked sooner.       Influenza A by PCR NEGATIVE NEGATIVE Final   Influenza B by PCR NEGATIVE NEGATIVE Final    Comment: (NOTE) The Xpert Xpress SARS-CoV-2/FLU/RSV plus assay is intended as an aid in the diagnosis of influenza from Nasopharyngeal swab specimens and should not be used as a sole basis for treatment. Nasal washings and aspirates are  unacceptable for Xpert Xpress SARS-CoV-2/FLU/RSV testing.  Fact Sheet for Patients: EntrepreneurPulse.com.au  Fact Sheet for Healthcare Providers: IncredibleEmployment.be  This test is not yet approved or cleared by the [place] FDA and has been authorized for detection and/or diagnosis of SARS-CoV-2 by FDA under an Emergency Use Authorization (EUA). This EUA will remain in effect (meaning this test can be used) for the duration of the COVID-19 declaration under Section 564(b)(1) of the Act, 21 U.S.C. section 360bbb-3(b)(1), unless the authorization is terminated or revoked.  Performed at [facility], [address] [place], [place] [address]   Culture, blood (routine x 2)     Status: None (Preliminary result)   Collection Time: 03/08/21  1:07 AM   Specimen: BLOOD  Result Value Ref Range Status   Specimen Description BLOOD RIGHT HAND  Final   Special Requests   Final    BOTTLES DRAWN AEROBIC AND ANAEROBIC Blood Culture results may not be optimal due to an inadequate volume of blood received in culture bottles   Culture   Final    NO GROWTH 4 DAYS Performed at [facility], [address] [place], [place] [address]    Report Status PENDING  Incomplete  Urine Culture     Status: Abnormal   Collection Time: 03/08/21  1:17 AM   Specimen: Urine, Clean Catch  Result Value Ref Range Status   Specimen Description   Final    URINE, CLEAN CATCH Performed at [facility], [address], [place], [place] [address]    Special Requests   Final    NONE Performed at [facility], [address], [place], [place] [address]    Culture (A)  Final    20,000 COLONIES/mL STREPTOCOCCUS AGALACTIAE TESTING AGAINST S. AGALACTIAE NOT ROUTINELY PERFORMED DUE TO PREDICTABILITY OF AMP/PEN/VAN SUSCEPTIBILITY. Performed at [facility], [address], [place], [place] [address]    Report Status 03/09/2021  FINAL  Final  Culture, blood (routine x 2)     Status: None (Preliminary result)   Collection Time: 03/08/21  2:02 AM   Specimen: BLOOD  Result Value Ref Range Status   Specimen Description BLOOD RIGHT HAND  Final   Special Requests   Final    BOTTLES DRAWN AEROBIC AND ANAEROBIC Blood Culture adequate volume   Culture   Final    NO GROWTH 4 DAYS Performed at [facility], [address] [place], [place] [address]    Report Status PENDING  Incomplete          Radiology Studies: No results found.      Scheduled Meds:  apixaban  2.5 mg Oral BID   doxazosin  8 mg Oral Q12H   feeding supplement  237 mL Oral TID BM   furosemide  40 mg Intravenous BID  insulin aspart  0-15 Units Subcutaneous Q4H   metoprolol succinate  25 mg Oral Daily   multivitamin with minerals  1 tablet Oral Daily   pantoprazole (PROTONIX) IV  40 mg Intravenous Q24H   potassium chloride  40 mEq Oral BID   rosuvastatin  10 mg Oral Daily   traZODone  100 mg Oral QHS   Continuous Infusions:          Aline August, MD [facility] 03/12/2021, 7:54 AM

## 2021-03-12 NOTE — Progress Notes (Signed)
Progress Note  Patient Name: Timothy Riley Date of Encounter: 03/12/2021  [facility] Cardiologist: Ida Rogue, MD   Subjective   Noting some improvement in his breathing and edema.  He is frustrated that it is taking so long to get his edema resolved.  Inpatient Medications    Scheduled Meds:  apixaban  2.5 mg Oral BID   doxazosin  8 mg Oral Q12H   feeding supplement  237 mL Oral TID BM   furosemide  40 mg Intravenous BID   insulin aspart  0-15 Units Subcutaneous Q4H   metoprolol succinate  25 mg Oral Daily   multivitamin with minerals  1 tablet Oral Daily   pantoprazole (PROTONIX) IV  40 mg Intravenous Q24H   potassium chloride  40 mEq Oral BID   rosuvastatin  10 mg Oral Daily   traZODone  100 mg Oral QHS   Continuous Infusions:  PRN Meds: acetaminophen **OR** acetaminophen, ondansetron **OR** ondansetron (ZOFRAN) IV   Vital Signs    Vitals:   03/12/21 0042 03/12/21 0044 03/12/21 0352 03/12/21 0733  BP: (!) 151/74 (!) 151/74 131/76 (!) 158/76  Pulse: 75 75 68 73  Resp:  16 16 16   Temp:  98.6 F (37 C) 98.5 F (36.9 C) 98.3 F (36.8 C)  TempSrc:  Oral    SpO2: 98% 94% 99% 99%  Weight:      Height:        Intake/Output Summary (Last 24 hours) at 03/12/2021 0932 Last data filed at 03/12/2021 0018 Gross per 24 hour  Intake 600 ml  Output 2500 ml  Net -1900 ml   Last 3 Weights 03/11/2021 03/10/2021 03/09/2021  Weight (lbs) 206 lb 2.1 oz 203 lb 7.8 oz 206 lb 9.1 oz  Weight (kg) 93.5 kg 92.3 kg 93.7 kg      Telemetry    N/a Personally Reviewed  ECG    N/a - Personally Reviewed  Physical Exam   GEN: No acute distress.   Neck: No JVD Cardiac: RRR, no murmurs, rubs, or gallops.  Respiratory: Clear to auscultation bilaterally. GI: Soft, nontender, non-distended  MS:  2+ LE edema to upper tibia bilaterally.  No deformity. Neuro:  Nonfocal  Psych: Normal affect   Labs    High Sensitivity Troponin:   Recent Labs  Lab 03/07/21 2207   TROPONINIHS 41*     Chemistry Recent Labs  Lab 03/07/21 2207 03/07/21 2207 03/09/21 0320 03/10/21 0442 03/11/21 0415 03/12/21 0358  NA 139  --  139 139 141 140  K 3.5  --  3.2* 4.1 4.1 3.7  CL 101  --  105 104 101 96*  CO2 27  --  29 31 31  35*  GLUCOSE 166*  --  120* 107* 92 79  BUN 22  --  26* 29* 29* 31*  CREATININE 1.68*  --  1.96* 1.87* 1.68* 1.83*  CALCIUM 9.2  --  8.0* 8.3* 8.7* 9.0  MG  --    < > 2.0 2.0 2.0 2.0  PROT 7.0  --  5.4*  --   --   --   ALBUMIN 3.9  --  3.0*  --   --   --   AST 27  --  19  --   --   --   ALT 12  --  8  --   --   --   ALKPHOS 66  --  47  --   --   --   BILITOT 1.5*  --  0.8  --   --   --   GFRNONAA 40*  --  34* 35* 40* 36*  ANIONGAP 11  --  5 4* 9 9   < > = values in this interval not displayed.    Lipids No results for input(s): CHOL, TRIG, HDL, LABVLDL, LDLCALC, CHOLHDL in the last 168 hours.  Hematology Recent Labs  Lab 03/07/21 2207 03/09/21 0320 03/10/21 0442  WBC 4.7 3.9* 4.5  RBC 4.05* 3.13* 3.28*  HGB 11.6* 9.0* 9.3*  HCT 37.5* 29.3* 30.5*  MCV 92.6 93.6 93.0  MCH 28.6 28.8 28.4  MCHC 30.9 30.7 30.5  RDW 14.6 15.1 14.9  PLT 157 140* 140*   Thyroid No results for input(s): TSH, FREET4 in the last 168 hours.  BNPNo results for input(s): BNP, PROBNP in the last 168 hours.  DDimer No results for input(s): DDIMER in the last 168 hours.   Radiology    No results found.  Cardiac Studies   2D echo 09/13/2020: 1. Left ventricular ejection fraction, by estimation, is 50 to 55%. The  left ventricle has low normal function. Left ventricular endocardial  border not optimally defined to evaluate regional wall motion. There is  mild left ventricular hypertrophy. Left  ventricular diastolic parameters are consistent with Grade II diastolic  dysfunction (pseudonormalization). Elevated left atrial pressure.   2. Right ventricular systolic function is normal. The right ventricular  size is normal. There is moderately elevated  pulmonary artery systolic  pressure.   3. Left atrial size was moderately dilated.   4. Right atrial size was mildly dilated.   5. The mitral valve is abnormal. Mild mitral valve regurgitation. No  evidence of mitral stenosis.   6. Tricuspid valve regurgitation is mild to moderate.   7. The aortic valve has been repaired/replaced. Aortic valve  regurgitation is mild to moderate. There is a 23 mm Edwards bioprosthetic  valve present in the aortic position. Procedure Date: 03/01/14. Aortic valve  mean gradient measures 13.0 mmHg.   8. The inferior vena cava is dilated in size with >50% respiratory  variability, suggesting right atrial pressure of 8 mmHg.     Patient Profile     81 y.o. male with chronic diastolic heart failure, pulmonary HTN, persistent atrail fib, s/p bioprostheic AVR 2016, DM admitted with acute on chronic heart failure/pHTN.   Assessment & Plan    # Acute on chronic diastolic heart failure: # Pulmonary HTN:  He is net -1.9L in the last 24 h and -5.5L since admission.   Renal function remains somewhat labile but relatively stable.  Continue diuresis with IV lasix.    # HTN:  BP poorly controlled on metoprolol and doxazosin. Would consider switching metoprolol to carvedilol.  Avoid amlodipine due to his edema but could also consider hydralazine if needed.   # s/p AVR: Mild-moderate AR..  Mean gradeint 13 mmHg.  # Persistent afib:  Continue Eliquis and metoprolol.   # HL: On rosuvastatin.       For questions or updates, please contact [place] Please consult [email or web address] for contact info under        Signed, Skeet Latch, MD  03/12/2021, 9:32 AM

## 2021-03-13 DIAGNOSIS — N179 Acute kidney failure, unspecified: Secondary | ICD-10-CM

## 2021-03-13 LAB — GLUCOSE, CAPILLARY
Glucose-Capillary: 86 mg/dL (ref 70–99)
Glucose-Capillary: 150 mg/dL — ABNORMAL HIGH (ref 70–99)
Glucose-Capillary: 98 mg/dL (ref 70–99)
Glucose-Capillary: 171 mg/dL — ABNORMAL HIGH (ref 70–99)
Glucose-Capillary: 156 mg/dL — ABNORMAL HIGH (ref 70–99)

## 2021-03-13 LAB — BASIC METABOLIC PANEL
Anion gap: 15 (ref 5–15)
BUN: 31 mg/dL — ABNORMAL HIGH (ref 8–23)
CO2: 31 mmol/L (ref 22–32)
Calcium: 8.9 mg/dL (ref 8.9–10.3)
Chloride: 92 mmol/L — ABNORMAL LOW (ref 98–111)
Creatinine, Ser: 1.92 mg/dL — ABNORMAL HIGH (ref 0.61–1.24)
GFR, Estimated: 34 mL/min — ABNORMAL LOW (ref >60)
Glucose, Bld: 143 mg/dL — ABNORMAL HIGH (ref 70–99)
Potassium: 4 mmol/L (ref 3.5–5.1)
Sodium: 138 mmol/L (ref 135–145)

## 2021-03-13 LAB — CULTURE, BLOOD (ROUTINE X 2)
Culture: NO GROWTH
Culture: NO GROWTH
Special Requests: ADEQUATE

## 2021-03-13 LAB — MAGNESIUM: Magnesium: 2.1 mg/dL (ref 1.7–2.4)

## 2021-03-13 MED ORDER — CARVEDILOL 3.125 MG PO TABS
6.2500 mg | ORAL_TABLET | Freq: Two times a day (BID) | ORAL | Status: DC
  Administered 2021-03-13 – 2021-03-14 (×2): 6.25 mg via ORAL
  Filled 2021-03-13 (×2): qty 2

## 2021-03-13 NOTE — Plan of Care (Signed)
°  Problem: Education: Goal: Knowledge of General Education information will improve Description: Including pain rating scale, medication(s)/side effects and non-pharmacologic comfort measures Outcome: Progressing   Problem: Clinical Measurements: Goal: Ability to maintain clinical measurements within normal limits will improve Outcome: Progressing   Problem: Clinical Measurements: Goal: Diagnostic test results will improve Outcome: Progressing   Problem: Clinical Measurements: Goal: Cardiovascular complication will be avoided Outcome: Progressing   Problem: Elimination: Goal: Will not experience complications related to bowel motility Outcome: Progressing   Problem: Pain Managment: Goal: General experience of comfort will improve Outcome: Progressing   Problem: Safety: Goal: Ability to remain free from injury will improve Outcome: Progressing

## 2021-03-13 NOTE — Progress Notes (Signed)
PROGRESS NOTE    Timothy Riley  HER:[id number] DOB: [birth year] DOA: 03/07/2021 PCP: Derinda Late, MD   Brief Narrative:  81 year old male with history of persistent A-fib on Eliquis, status post bioprosthetic AVR, grade 2 diastolic dysfunction (EF of 50 to 55% in 08/2020, diabetes mellitus type 2, nonobstructive CAD, hypertension, CKD stage IIIb presented with vomiting, diarrhea, abdominal pain and poor oral intake.  On presentation, he was afebrile with normal WBCs and lactic acid with creatinine of 1.68.  CT of the abdomen and pelvis showed bilateral pleural effusions, small volume abdominal and pelvic ascites with edema around the kidneys, no bowel obstruction, cholelithiasis without acute cholecystitis and mild splenic enlargement.  He was started on IV fluids.  Subsequently, IV fluids discontinued on 03/09/2021 because of dyspnea and lower extremity swelling.  Cardiology was consulted on 03/09/2021.  Assessment & Plan:  Acute on chronic diastolic CHF -Lasix held on presentation.  Strict input and output.  Daily weights.  Negative balance of 6425.5 cc since admission. -Cardiology following.  Currently still on Lasix 40 mg IV twice daily.  Continue metoprolol succinate.  Possible acute gastroenteritis -Suspect viral. -Diarrhea has resolved.  DC'd stool testing. -Tolerating diet.  IV discontinued on 03/09/2021.  Continue Protonix and antiemetics as needed  Acute kidney injury on chronic kidney disease stage IIIb -Creatinine 1.92 today.  Baseline creatinine of around 1.5-1.6. Repeat a.m. labs.  Diuretic plan as above.  Persistent A-fib -Currently rate controlled.  Continue metoprolol succinate and Eliquis  Diabetes mellitus type 2 -A1c 5.4.  Continue CBGs with SSI  Thrombocytopenia -Questionable cause.  No signs of bleeding.  Anemia of chronic disease -Possibly from kidney disease.  Hemoglobin stable  Hypokalemia -Resolved  BPH -Continue doxazosin  CAD -Stable.  No chest  pain.  Continue metoprolol and statin  Hyperlipidemia Continue statin  History of AVR -Outpatient follow-up with cardiology   DVT prophylaxis: Eliquis Code Status: Full Family Communication: Spoke to son on phone on 03/09/2021 Disposition Plan: Status is: inpatient because: Of need for IV Lasix for fluid overload   Consultants: Cardiology  Procedures: None  Antimicrobials: None   Subjective: Patient seen and examined at bedside.  Hard of hearing.  Denies fever, worsening abdominal pain, vomiting or chest pain.   Objective: Vitals:   03/12/21 1541 03/12/21 2030 03/13/21 0221 03/13/21 0450  BP: (!) 159/75 (!) 152/74 (!) 163/87 (!) 158/84  Pulse: 63 94 74 88  Resp: 18 18 18 18   Temp: 98.6 F (37 C) 98.4 F (36.9 C) 97.7 F (36.5 C) 98.1 F (36.7 C)  TempSrc: Oral Oral Oral Oral  SpO2: 94% 94% 96% 96%  Weight:      Height:        Intake/Output Summary (Last 24 hours) at 03/13/2021 0730 Last data filed at 03/12/2021 1846 Gross per 24 hour  Intake 600 ml  Output 1475 ml  Net -875 ml    Filed Weights   03/09/21 0500 03/10/21 0500 03/11/21 0500  Weight: 93.7 kg 92.3 kg 93.5 kg    Examination:  General exam: No distress.  On room air.  Extremely hard of hearing.  Elderly male lying in bed. Respiratory system: Bilateral decreased breath sound at bases with scattered crackles  cardiovascular system: S1-S2 heard; currently rate controlled gastrointestinal system: Abdomen is mildly distended; soft and nontender.  Normal bowel sounds heard  extremities: No clubbing; trace lower extremity edema present Central nervous system: Alert; still slow to respond; poor historian.  No focal neurological deficits.  Moving extremities  skin: No obvious petechiae/rashes psychiatry: Currently not agitated.  Flat affect.  Data Reviewed: I have personally reviewed following labs and imaging studies  CBC: Recent Labs  Lab 03/07/21 2207 03/09/21 0320 03/10/21 0442  WBC 4.7 3.9* 4.5   NEUTROABS  --  2.3 3.0  HGB 11.6* 9.0* 9.3*  HCT 37.5* 29.3* 30.5*  MCV 92.6 93.6 93.0  PLT 157 140* 140*    Basic Metabolic Panel: Recent Labs  Lab 03/09/21 0320 03/10/21 0442 03/11/21 0415 03/12/21 0358 03/13/21 0427  NA 139 139 141 140 138  K 3.2* 4.1 4.1 3.7 4.0  CL 105 104 101 96* 92*  CO2 29 31 31  35* 31  GLUCOSE 120* 107* 92 79 143*  BUN 26* 29* 29* 31* 31*  CREATININE 1.96* 1.87* 1.68* 1.83* 1.92*  CALCIUM 8.0* 8.3* 8.7* 9.0 8.9  MG 2.0 2.0 2.0 2.0 2.1    GFR: Estimated Creatinine Clearance: 31.2 mL/min (A) (by C-G formula based on SCr of 1.92 mg/dL (H)). Liver Function Tests: Recent Labs  Lab 03/07/21 2207 03/09/21 0320  AST 27 19  ALT 12 8  ALKPHOS 66 47  BILITOT 1.5* 0.8  PROT 7.0 5.4*  ALBUMIN 3.9 3.0*    Recent Labs  Lab 03/07/21 2207  LIPASE 27    No results for input(s): AMMONIA in the last 168 hours. Coagulation Profile: No results for input(s): INR, PROTIME in the last 168 hours. Cardiac Enzymes: No results for input(s): CKTOTAL, CKMB, CKMBINDEX, TROPONINI in the last 168 hours. BNP (last 3 results) No results for input(s): PROBNP in the last 8760 hours. HbA1C: No results for input(s): HGBA1C in the last 72 hours.  CBG: Recent Labs  Lab 03/12/21 0734 03/12/21 1133 03/12/21 1609 03/12/21 2153 03/13/21 0359  GLUCAP 96 150* 116* 142* 150*    Lipid Profile: No results for input(s): CHOL, HDL, LDLCALC, TRIG, CHOLHDL, LDLDIRECT in the last 72 hours. Thyroid Function Tests: No results for input(s): TSH, T4TOTAL, FREET4, T3FREE, THYROIDAB in the last 72 hours. Anemia Panel: No results for input(s): VITAMINB12, FOLATE, FERRITIN, TIBC, IRON, RETICCTPCT in the last 72 hours. Sepsis Labs: Recent Labs  Lab 03/08/21 0107 03/08/21 0202  LATICACIDVEN 1.1 0.8     Recent Results (from the past 240 hour(s))  Resp Panel by RT-PCR (Flu A&B, Covid) Nasopharyngeal Swab     Status: None   Collection Time: 03/07/21 11:21 PM   Specimen:  Nasopharyngeal Swab; Nasopharyngeal(NP) swabs in vial transport medium  Result Value Ref Range Status   SARS Coronavirus 2 by RT PCR NEGATIVE NEGATIVE Final    Comment: (NOTE) SARS-CoV-2 target nucleic acids are NOT DETECTED.  The SARS-CoV-2 RNA is generally detectable in upper respiratory specimens during the acute phase of infection. The lowest concentration of SARS-CoV-2 viral copies this assay can detect is 138 copies/mL. A negative result does not preclude SARS-Cov-2 infection and should not be used as the sole basis for treatment or other patient management decisions. A negative result may occur with  improper specimen collection/handling, submission of specimen other than nasopharyngeal swab, presence of viral mutation(s) within the areas targeted by this assay, and inadequate number of viral copies(<138 copies/mL). A negative result must be combined with clinical observations, patient history, and epidemiological information. The expected result is Negative.  Fact Sheet for Patients:  EntrepreneurPulse.com.au  Fact Sheet for Healthcare Providers:  IncredibleEmployment.be  This test is no t yet approved or cleared by the [place] FDA and  has been authorized for detection and/or diagnosis of SARS-CoV-2  by FDA under an Emergency Use Authorization (EUA). This EUA will remain  in effect (meaning this test can be used) for the duration of the COVID-19 declaration under Section 564(b)(1) of the Act, 21 U.S.C.section 360bbb-3(b)(1), unless the authorization is terminated  or revoked sooner.       Influenza A by PCR NEGATIVE NEGATIVE Final   Influenza B by PCR NEGATIVE NEGATIVE Final    Comment: (NOTE) The Xpert Xpress SARS-CoV-2/FLU/RSV plus assay is intended as an aid in the diagnosis of influenza from Nasopharyngeal swab specimens and should not be used as a sole basis for treatment. Nasal washings and aspirates are unacceptable for  Xpert Xpress SARS-CoV-2/FLU/RSV testing.  Fact Sheet for Patients: EntrepreneurPulse.com.au  Fact Sheet for Healthcare Providers: IncredibleEmployment.be  This test is not yet approved or cleared by the [place] FDA and has been authorized for detection and/or diagnosis of SARS-CoV-2 by FDA under an Emergency Use Authorization (EUA). This EUA will remain in effect (meaning this test can be used) for the duration of the COVID-19 declaration under Section 564(b)(1) of the Act, 21 U.S.C. section 360bbb-3(b)(1), unless the authorization is terminated or revoked.  Performed at [facility], [address] [place], [place] [address]   Culture, blood (routine x 2)     Status: None   Collection Time: 03/08/21  1:07 AM   Specimen: BLOOD  Result Value Ref Range Status   Specimen Description BLOOD RIGHT HAND  Final   Special Requests   Final    BOTTLES DRAWN AEROBIC AND ANAEROBIC Blood Culture results may not be optimal due to an inadequate volume of blood received in culture bottles   Culture   Final    NO GROWTH 5 DAYS Performed at [facility], [address] [place], [place] [address]    Report Status 03/13/2021 FINAL  Final  Urine Culture     Status: Abnormal   Collection Time: 03/08/21  1:17 AM   Specimen: Urine, Clean Catch  Result Value Ref Range Status   Specimen Description   Final    URINE, CLEAN CATCH Performed at [facility], [address] [place], [place] [address]    Special Requests   Final    NONE Performed at [facility], [place], [place] [address]    Culture (A)  Final    20,000 COLONIES/mL STREPTOCOCCUS AGALACTIAE TESTING AGAINST S. AGALACTIAE NOT ROUTINELY PERFORMED DUE TO PREDICTABILITY OF AMP/PEN/VAN SUSCEPTIBILITY. Performed at [facility], [place] [address], [place], [place] [address]    Report Status 03/09/2021 FINAL  Final  Culture, blood (routine  x 2)     Status: None   Collection Time: 03/08/21  2:02 AM   Specimen: BLOOD  Result Value Ref Range Status   Specimen Description BLOOD RIGHT HAND  Final   Special Requests   Final    BOTTLES DRAWN AEROBIC AND ANAEROBIC Blood Culture adequate volume   Culture   Final    NO GROWTH 5 DAYS Performed at [facility], [address], [place], [place] [address]    Report Status 03/13/2021 FINAL  Final          Radiology Studies: No results found.      Scheduled Meds:  apixaban  2.5 mg Oral BID   doxazosin  8 mg Oral Q12H   feeding supplement  237 mL Oral TID BM   furosemide  40 mg Intravenous BID   insulin aspart  0-15 Units Subcutaneous Q4H  metoprolol succinate  25 mg Oral Daily   multivitamin with minerals  1 tablet Oral Daily   pantoprazole  40 mg Oral Daily   potassium chloride  40 mEq Oral BID   rosuvastatin  10 mg Oral Daily   traZODone  100 mg Oral QHS   Continuous Infusions:          Aline August, MD [facility] 03/13/2021, 7:30 AM

## 2021-03-13 NOTE — Progress Notes (Signed)
Physical Therapy Treatment Patient Details Name: Timothy Riley MRN: [id number] DOB: 07/18/1940 Today's Date: 03/13/2021   History of Present Illness Timothy Riley is a 81 y.o. male with medical history significant of Persistent A-fib on Eliquis, s/p bioprosthetic AVR, G2 DD (EF 50 to 55% 08/2020), diabetes, nonobstructive CAD with low risk Myoview in 2020, HTN, CKD 3B, who presents to the ED with a 2-day history of vomiting diarrhea and abdominal pain, poor oral intake with inability to hold down anything, including meds.    PT Comments    Patient tolerated session well and was agreeable to treatment. No pain reported throughout session. Upon entering room patient was utilizing the bathroom. Patient continues to demonstrate improvement towards his goals. He is able to demonstrate Mod I with bed mobility and sit to stand transfers from EOB. Therapeutic exercises focused on BLE strengthening (LAQ, sit to stands, calf raises). Patient also demonstrated slight decrease in utilization of tactile cueing from furniture when ambulating around the nurses station. No LOB noted during ambulation, continues to require supervision due to staggering gait. Patient left supine in bed with all needs met. Patient would continue to benefit from skilled physical therapy in order to optimize patient's return to PLOF. Continue to recommend HHPT upon discharge from acute care hospitalization.    Recommendations for follow up therapy are one component of a multi-disciplinary discharge planning process, led by the attending physician.  Recommendations may be updated based on patient status, additional functional criteria and insurance authorization.  Follow Up Recommendations  Home health PT     Assistance Recommended at Discharge Frequent or constant Supervision/Assistance  Patient can return home with the following A little help with walking and/or transfers;A little help with bathing/dressing/bathroom;Assistance  with cooking/housework;Assist for transportation;Help with stairs or ramp for entrance   Equipment Recommendations  None recommended by PT    Recommendations for Other Services       Precautions / Restrictions Precautions Precautions: Fall Restrictions Weight Bearing Restrictions: No     Mobility  Bed Mobility Overal bed mobility: Modified Independent                  Transfers Overall transfer level: Modified independent Equipment used: None Transfers: Sit to/from Stand Sit to Stand: Modified independent (Device/Increase time)                Ambulation/Gait Ambulation/Gait assistance: Supervision Gait Distance (Feet): 200 Feet Assistive device: None Gait Pattern/deviations: Step-through pattern, Decreased step length - right, Decreased step length - left, Staggering right, Staggering left Gait velocity: decreased     General Gait Details: patient reaching out for furniture, counter, walls during ambulation in room and hallway. Mild unsteadiness however no LOB noted   Stairs             Wheelchair Mobility    Modified Rankin (Stroke Patients Only)       Balance Overall balance assessment: Needs assistance Sitting-balance support: Feet supported Sitting balance-Leahy Scale: Good     Standing balance support: No upper extremity supported, Single extremity supported, During functional activity Standing balance-Leahy Scale: Fair Standing balance comment: unsteady with mobility. Needing at least intermittent single UE support                            Cognition Arousal/Alertness: Awake/alert Behavior During Therapy: WFL for tasks assessed/performed Overall Cognitive Status: Within Functional Limits for tasks assessed  General Comments: pleasant man to work with        Exercises Total Joint Exercises Long Arc Quad: Seated, AROM, 20 reps, Both Other Exercises Other Exercises:  x5 sit to stands from EOB Other Exercises: x10 calf raises with BUE support from counter top    General Comments        Pertinent Vitals/Pain Pain Assessment Pain Assessment: No/denies pain Pain Intervention(s): Limited activity within patient's tolerance, Monitored during session, Repositioned    Home Living                          Prior Function            PT Goals (current goals can now be found in the care plan section) Acute Rehab PT Goals Patient Stated Goal: none stated PT Goal Formulation: With patient Time For Goal Achievement: 03/22/21 Progress towards PT goals: Progressing toward goals    Frequency    Min 2X/week      PT Plan Current plan remains appropriate    Co-evaluation              AM-PAC PT "6 Clicks" Mobility   Outcome Measure  Help needed turning from your back to your side while in a flat bed without using bedrails?: None Help needed moving from lying on your back to sitting on the side of a flat bed without using bedrails?: None Help needed moving to and from a bed to a chair (including a wheelchair)?: A Little Help needed standing up from a chair using your arms (e.g., wheelchair or bedside chair)?: A Little Help needed to walk in hospital room?: A Little Help needed climbing 3-5 steps with a railing? : A Lot 6 Click Score: 19    End of Session Equipment Utilized During Treatment: Gait belt Activity Tolerance: Patient tolerated treatment well Patient left: in bed;with call bell/phone within reach Nurse Communication: Mobility status PT Visit Diagnosis: Unsteadiness on feet (R26.81);Muscle weakness (generalized) (M62.81);Difficulty in walking, not elsewhere classified (R26.2)     Time: 3524-8185 PT Time Calculation (min) (ACUTE ONLY): 21 min  Charges:  $Gait Training: 8-22 mins                    Iva Boop, PT  03/13/21. 4:04 PM

## 2021-03-13 NOTE — Progress Notes (Signed)
Progress Note  Patient Name: Timothy Riley Date of Encounter: 03/13/2021  [facility] Cardiologist: Ida Rogue, MD   Subjective   Patient is overall feeling OK. No chest pain or SOB.   Inpatient Medications    Scheduled Meds:  apixaban  2.5 mg Oral BID   doxazosin  8 mg Oral Q12H   feeding supplement  237 mL Oral TID BM   furosemide  40 mg Intravenous BID   insulin aspart  0-15 Units Subcutaneous Q4H   metoprolol succinate  25 mg Oral Daily   multivitamin with minerals  1 tablet Oral Daily   pantoprazole  40 mg Oral Daily   potassium chloride  40 mEq Oral BID   rosuvastatin  10 mg Oral Daily   traZODone  100 mg Oral QHS   Continuous Infusions:  PRN Meds: acetaminophen **OR** acetaminophen, ondansetron **OR** ondansetron (ZOFRAN) IV   Vital Signs    Vitals:   03/12/21 2030 03/13/21 0221 03/13/21 0450 03/13/21 0802  BP: (!) 152/74 (!) 163/87 (!) 158/84 (!) 167/78  Pulse: 94 74 88 71  Resp: 18 18 18 18   Temp: 98.4 F (36.9 C) 97.7 F (36.5 C) 98.1 F (36.7 C) 98.5 F (36.9 C)  TempSrc: Oral Oral Oral   SpO2: 94% 96% 96% 93%  Weight:      Height:        Intake/Output Summary (Last 24 hours) at 03/13/2021 1346 Last data filed at 03/13/2021 1200 Gross per 24 hour  Intake 600 ml  Output 1400 ml  Net -800 ml   Last 3 Weights 03/11/2021 03/10/2021 03/09/2021  Weight (lbs) 206 lb 2.1 oz 203 lb 7.8 oz 206 lb 9.1 oz  Weight (kg) 93.5 kg 92.3 kg 93.7 kg      Telemetry    N/A - Personally Reviewed  ECG    No new - Personally Reviewed  Physical Exam   GEN: No acute distress.   Neck: No JVD Cardiac: RRR, no murmurs, rubs, or gallops.  Respiratory: Clear to auscultation bilaterally. GI: Soft, nontender, non-distended  MS: minimal lower leg edema; No deformity. Neuro:  Nonfocal  Psych: Normal affect   Labs    High Sensitivity Troponin:   Recent Labs  Lab 03/07/21 2207  TROPONINIHS 41*     Chemistry Recent Labs  Lab 03/07/21 2207  03/09/21 0320 03/10/21 0442 03/11/21 0415 03/12/21 0358 03/13/21 0427  NA 139 139   < > 141 140 138  K 3.5 3.2*   < > 4.1 3.7 4.0  CL 101 105   < > 101 96* 92*  CO2 27 29   < > 31 35* 31  GLUCOSE 166* 120*   < > 92 79 143*  BUN 22 26*   < > 29* 31* 31*  CREATININE 1.68* 1.96*   < > 1.68* 1.83* 1.92*  CALCIUM 9.2 8.0*   < > 8.7* 9.0 8.9  MG  --  2.0   < > 2.0 2.0 2.1  PROT 7.0 5.4*  --   --   --   --   ALBUMIN 3.9 3.0*  --   --   --   --   AST 27 19  --   --   --   --   ALT 12 8  --   --   --   --   ALKPHOS 66 47  --   --   --   --   BILITOT 1.5* 0.8  --   --   --   --  GFRNONAA 40* 34*   < > 40* 36* 34*  ANIONGAP 11 5   < > 9 9 15    < > = values in this interval not displayed.    Lipids No results for input(s): CHOL, TRIG, HDL, LABVLDL, LDLCALC, CHOLHDL in the last 168 hours.  Hematology Recent Labs  Lab 03/07/21 2207 03/09/21 0320 03/10/21 0442  WBC 4.7 3.9* 4.5  RBC 4.05* 3.13* 3.28*  HGB 11.6* 9.0* 9.3*  HCT 37.5* 29.3* 30.5*  MCV 92.6 93.6 93.0  MCH 28.6 28.8 28.4  MCHC 30.9 30.7 30.5  RDW 14.6 15.1 14.9  PLT 157 140* 140*   Thyroid No results for input(s): TSH, FREET4 in the last 168 hours.  BNPNo results for input(s): BNP, PROBNP in the last 168 hours.  DDimer No results for input(s): DDIMER in the last 168 hours.   Radiology    No results found.  Cardiac Studies   2D echo 09/13/2020: 1. Left ventricular ejection fraction, by estimation, is 50 to 55%. The  left ventricle has low normal function. Left ventricular endocardial  border not optimally defined to evaluate regional wall motion. There is  mild left ventricular hypertrophy. Left  ventricular diastolic parameters are consistent with Grade II diastolic  dysfunction (pseudonormalization). Elevated left atrial pressure.   2. Right ventricular systolic function is normal. The right ventricular  size is normal. There is moderately elevated pulmonary artery systolic  pressure.   3. Left atrial size  was moderately dilated.   4. Right atrial size was mildly dilated.   5. The mitral valve is abnormal. Mild mitral valve regurgitation. No  evidence of mitral stenosis.   6. Tricuspid valve regurgitation is mild to moderate.   7. The aortic valve has been repaired/replaced. Aortic valve  regurgitation is mild to moderate. There is a 23 mm Edwards bioprosthetic  valve present in the aortic position. Procedure Date: 03/01/14. Aortic valve  mean gradient measures 13.0 mmHg.   8. The inferior vena cava is dilated in size with >50% respiratory  variability, suggesting right atrial pressure of 8 mmHg.    Patient Profile     81 y.o. male with chronic diastolic heart failure, pulmonary hypertension, persistent afib,s/p bioprosthetic AVR 2016, and diabetes admitted with heart failure.   Assessment & Plan    Acute on chronic diastolic heart failure Pulmonary HTN - IV lasix 40mg  daily - UOP -1.4L, net -7L - Scr stable - continue with diuresis, can maybe switch to oral tomorrow - PTA lasix 20mg  daily, may need 40 mg at discharge - CHF educations discussed  HTN - BP elevated - can switch metoprolol to Coreg  S/p AVR - continue with serial echocardiograms  Persistent Afib - remains in SR - CHADSVASC at least 4 - continue Eliquis 2.5mg BID - Toprol-XL  HL - LDL 79 in 2020 - continue statin  For questions or updates, please contact [facility] Please consult [email or web address] for contact info under        Signed, Henrietta Cieslewicz Ninfa Meeker, PA-C  03/13/2021, 1:46 PM

## 2021-03-14 LAB — GLUCOSE, CAPILLARY
Glucose-Capillary: 135 mg/dL — ABNORMAL HIGH (ref 70–99)
Glucose-Capillary: 175 mg/dL — ABNORMAL HIGH (ref 70–99)
Glucose-Capillary: 176 mg/dL — ABNORMAL HIGH (ref 70–99)
Glucose-Capillary: 194 mg/dL — ABNORMAL HIGH (ref 70–99)
Glucose-Capillary: 203 mg/dL — ABNORMAL HIGH (ref 70–99)
Glucose-Capillary: 68 mg/dL — ABNORMAL LOW (ref 70–99)

## 2021-03-14 LAB — BASIC METABOLIC PANEL
Anion gap: 9 (ref 5–15)
BUN: 31 mg/dL — ABNORMAL HIGH (ref 8–23)
CO2: 37 mmol/L — ABNORMAL HIGH (ref 22–32)
Calcium: 9 mg/dL (ref 8.9–10.3)
Chloride: 96 mmol/L — ABNORMAL LOW (ref 98–111)
Creatinine, Ser: 1.85 mg/dL — ABNORMAL HIGH (ref 0.61–1.24)
GFR, Estimated: 36 mL/min — ABNORMAL LOW (ref >60)
Glucose, Bld: 152 mg/dL — ABNORMAL HIGH (ref 70–99)
Potassium: 3.6 mmol/L (ref 3.5–5.1)
Sodium: 142 mmol/L (ref 135–145)

## 2021-03-14 LAB — MAGNESIUM: Magnesium: 2 mg/dL (ref 1.7–2.4)

## 2021-03-14 MED ORDER — FUROSEMIDE 40 MG PO TABS
40.0000 mg | ORAL_TABLET | Freq: Every day | ORAL | Status: DC
  Administered 2021-03-14 – 2021-03-15 (×2): 40 mg via ORAL
  Filled 2021-03-14 (×2): qty 1

## 2021-03-14 MED ORDER — CARVEDILOL 12.5 MG PO TABS
12.5000 mg | ORAL_TABLET | Freq: Two times a day (BID) | ORAL | Status: DC
  Administered 2021-03-14 – 2021-03-15 (×2): 12.5 mg via ORAL
  Filled 2021-03-14 (×2): qty 1

## 2021-03-14 MED ORDER — INSULIN ASPART 100 UNIT/ML IJ SOLN
0.0000 [IU] | Freq: Three times a day (TID) | INTRAMUSCULAR | Status: DC
  Administered 2021-03-14: 1 [IU] via SUBCUTANEOUS
  Administered 2021-03-14: 2 [IU] via SUBCUTANEOUS
  Administered 2021-03-15: 1 [IU] via SUBCUTANEOUS
  Filled 2021-03-14 (×3): qty 1

## 2021-03-14 MED ORDER — GLUCERNA SHAKE PO LIQD
237.0000 mL | Freq: Two times a day (BID) | ORAL | Status: DC
  Administered 2021-03-14: 237 mL via ORAL

## 2021-03-14 NOTE — Progress Notes (Signed)
Nutrition Follow-up  DOCUMENTATION CODES:   Obesity unspecified  INTERVENTION:   -D/c Ensure Enlive -Continue MVI with minerals daily -Glucerna Shake po BID, each supplement provides 220 kcal and 10 grams of protein   NUTRITION DIAGNOSIS:   Inadequate oral intake related to altered GI function as evidenced by meal completion < 25%.  Ongoing  GOAL:   Patient will meet greater than or equal to 90% of their needs  Progressing   MONITOR:   PO intake, Supplement acceptance, Labs, Weight trends, Skin, I & O's  REASON FOR ASSESSMENT:   Malnutrition Screening Tool    ASSESSMENT:   Timothy Riley is a 81 y.o. male with medical history significant of Persistent A-fib on Eliquis, s/p bioprosthetic AVR, G2 DD (EF 50 to 55% 08/2020), diabetes, nonobstructive CAD with low risk Myoview in 2020, HTN, CKD 3B, who presents to the ED with a 2-day history of vomiting diarrhea and abdominal pain, poor oral intake with inability to hold down anything, including meds.  Vomiting is nonbloody and nonbilious.  He has no affected contacts.  Denies fever or chills.  Abdominal pain is crampy and has since subsided.  Since onset of symptoms he has noted increased lower extremity edema but denies chest pain or shortness of breath.  Reviewed I/O's: -1.7 L x 24 hours and -8.1 L since admission  UOP: 1.9 L x 24 hours   Pt unavailable at time of visit. Attempted to speak with pt via call to hospital room phone, however, unable to reach.   Pt's intake has improved over the past week. Noted meal completions 50-100%. Pt has been consuming Ensure Enlive supplements. RD will switch to Glucerna secondary to improved oral intake and to optimize glycemic control.   Wt has been stable since admission.   Medications reviewed and include lasix.   Labs reviewed: CBGS: 68-194 (inpatient orders for glycemic control are 0-9 units insulin aspart TID with meals).    Diet Order:   Diet Order             Diet heart  healthy/carb modified Room service appropriate? Yes; Fluid consistency: Thin; Fluid restriction: 1500 mL Fluid  Diet effective now                   EDUCATION NEEDS:   No education needs have been identified at this time  Skin:  Skin Assessment: Reviewed RN Assessment  Last BM:  03/07/21  Height:   Ht Readings from Last 1 Encounters:  03/07/21 5\' 5"  (1.651 m)    Weight:   Wt Readings from Last 1 Encounters:  03/11/21 93.5 kg    Ideal Body Weight:  61.8 kg  BMI:  Body mass index is 34.3 kg/m.  Estimated Nutritional Needs:   Kcal:  1850-2050  Protein:  90-105 grams  Fluid:  > 1.8 L    Loistine Chance, RD, LDN, [place] Registered Dietitian II Certified Diabetes Care and Education Specialist Please refer to [facility] for RD and/or RD on-call/weekend/after hours pager

## 2021-03-14 NOTE — Progress Notes (Signed)
PROGRESS NOTE    Timothy Riley  GYK:[id number] DOB: 05-Mar-1940 DOA: 03/07/2021 PCP: Derinda Late, MD   Brief Narrative:  81 year old male with history of persistent A-fib on Eliquis, status post bioprosthetic AVR, grade 2 diastolic dysfunction (EF of 50 to 55% in 08/2020, diabetes mellitus type 2, nonobstructive CAD, hypertension, CKD stage IIIb presented with vomiting, diarrhea, abdominal pain and poor oral intake.  On presentation, he was afebrile with normal WBCs and lactic acid with creatinine of 1.68.  CT of the abdomen and pelvis showed bilateral pleural effusions, small volume abdominal and pelvic ascites with edema around the kidneys, no bowel obstruction, cholelithiasis without acute cholecystitis and mild splenic enlargement.  He was started on IV fluids.  Subsequently, IV fluids discontinued on 03/09/2021 because of dyspnea and lower extremity swelling.  Cardiology was consulted on 03/09/2021.  He was started on IV Lasix.  Assessment & Plan:  Acute on chronic diastolic CHF -Lasix held on presentation.  Strict input and output.  Daily weights.  Negative balance of 6425.5 cc since admission. -Cardiology following.  Currently still on Lasix 40 mg IV twice daily.  Switch to Lasix 40 mg daily as per cardiology recommendations.  If patient continues to do well, possible discharge home tomorrow.  Continue metoprolol succinate.  Possible acute gastroenteritis -Suspect viral. -Diarrhea has resolved.  DC'd stool testing. -Tolerating diet.  IV discontinued on 03/09/2021.    Acute kidney injury on chronic kidney disease stage IIIb -Creatinine 1.85 today.  Baseline creatinine of around 1.5-1.6. Repeat a.m. labs.  Diuretic plan as above.  Persistent A-fib -Currently rate controlled.  Continue metoprolol succinate and Eliquis  Diabetes mellitus type 2 -A1c 5.4.  Blood sugars intermittently on the lower side.  Continue CBGs with SSI  Thrombocytopenia -Questionable cause.  No signs of  bleeding.  Anemia of chronic disease -Possibly from kidney disease.  Hemoglobin stable  Hypokalemia -Resolved  BPH -Continue doxazosin  CAD -Stable.  No chest pain.  Continue metoprolol and statin  Hyperlipidemia Continue statin  History of AVR -Outpatient follow-up with cardiology   DVT prophylaxis: Eliquis Code Status: Full Family Communication: Spoke to son on phone on 03/09/2021 Disposition Plan: Status is: inpatient because: Possible discharge home tomorrow if remains stable on oral Lasix.   Consultants: Cardiology  Procedures: None  Antimicrobials: None   Subjective: Patient seen and examined at bedside.  Hard of hearing.  Feels slightly better but still weak.  No overnight fever, vomiting or chest pain reported.  Still mildly short of breath with exertion.   Objective: Vitals:   03/13/21 1516 03/14/21 0047 03/14/21 0414 03/14/21 0810  BP: (!) 156/85 (!) 152/76 (!) 159/73 (!) 142/79  Pulse: 68 69 71 73  Resp: 18 18  16   Temp: 98.4 F (36.9 C) 98.7 F (37.1 C)  97.7 F (36.5 C)  TempSrc:  Oral    SpO2: 90% 94% 94% 91%  Weight:      Height:        Intake/Output Summary (Last 24 hours) at 03/14/2021 1003 Last data filed at 03/14/2021 0000 Gross per 24 hour  Intake --  Output 1800 ml  Net -1800 ml    Filed Weights   03/09/21 0500 03/10/21 0500 03/11/21 0500  Weight: 93.7 kg 92.3 kg 93.5 kg    Examination:  General exam: Currently on room air.  No acute distress.  Extremely hard of hearing.  Elderly male lying in bed. Respiratory system: Decreased breath sounds at bases bilaterally with some crackles  cardiovascular system:  Rate controlled, S1-S2 heard gastrointestinal system: Abdomen is distended slightly; soft and nontender.  Bowel sounds are heard  extremities: Mild lower extremity edema present; no cyanosis  Central nervous system: Slow to respond; awake.  Poor historian.  No focal neurological deficits.  Moves extremities  skin: No obvious  ecchymosis/lesions psychiatry: Affect is flat.  No signs of agitation currently.  Data Reviewed: I have personally reviewed following labs and imaging studies  CBC: Recent Labs  Lab 03/07/21 2207 03/09/21 0320 03/10/21 0442  WBC 4.7 3.9* 4.5  NEUTROABS  --  2.3 3.0  HGB 11.6* 9.0* 9.3*  HCT 37.5* 29.3* 30.5*  MCV 92.6 93.6 93.0  PLT 157 140* 140*    Basic Metabolic Panel: Recent Labs  Lab 03/10/21 0442 03/11/21 0415 03/12/21 0358 03/13/21 0427 03/14/21 0535  NA 139 141 140 138 142  K 4.1 4.1 3.7 4.0 3.6  CL 104 101 96* 92* 96*  CO2 31 31 35* 31 37*  GLUCOSE 107* 92 79 143* 152*  BUN 29* 29* 31* 31* 31*  CREATININE 1.87* 1.68* 1.83* 1.92* 1.85*  CALCIUM 8.3* 8.7* 9.0 8.9 9.0  MG 2.0 2.0 2.0 2.1 2.0    GFR: Estimated Creatinine Clearance: 32.4 mL/min (A) (by C-G formula based on SCr of 1.85 mg/dL (H)). Liver Function Tests: Recent Labs  Lab 03/07/21 2207 03/09/21 0320  AST 27 19  ALT 12 8  ALKPHOS 66 47  BILITOT 1.5* 0.8  PROT 7.0 5.4*  ALBUMIN 3.9 3.0*    Recent Labs  Lab 03/07/21 2207  LIPASE 27    No results for input(s): AMMONIA in the last 168 hours. Coagulation Profile: No results for input(s): INR, PROTIME in the last 168 hours. Cardiac Enzymes: No results for input(s): CKTOTAL, CKMB, CKMBINDEX, TROPONINI in the last 168 hours. BNP (last 3 results) No results for input(s): PROBNP in the last 8760 hours. HbA1C: No results for input(s): HGBA1C in the last 72 hours.  CBG: Recent Labs  Lab 03/13/21 1219 03/13/21 1552 03/14/21 0009 03/14/21 0434 03/14/21 0812  GLUCAP 171* 156* 194* 176* 68*    Lipid Profile: No results for input(s): CHOL, HDL, LDLCALC, TRIG, CHOLHDL, LDLDIRECT in the last 72 hours. Thyroid Function Tests: No results for input(s): TSH, T4TOTAL, FREET4, T3FREE, THYROIDAB in the last 72 hours. Anemia Panel: No results for input(s): VITAMINB12, FOLATE, FERRITIN, TIBC, IRON, RETICCTPCT in the last 72 hours. Sepsis  Labs: Recent Labs  Lab 03/08/21 0107 03/08/21 0202  LATICACIDVEN 1.1 0.8     Recent Results (from the past 240 hour(s))  Resp Panel by RT-PCR (Flu A&B, Covid) Nasopharyngeal Swab     Status: None   Collection Time: 03/07/21 11:21 PM   Specimen: Nasopharyngeal Swab; Nasopharyngeal(NP) swabs in vial transport medium  Result Value Ref Range Status   SARS Coronavirus 2 by RT PCR NEGATIVE NEGATIVE Final    Comment: (NOTE) SARS-CoV-2 target nucleic acids are NOT DETECTED.  The SARS-CoV-2 RNA is generally detectable in upper respiratory specimens during the acute phase of infection. The lowest concentration of SARS-CoV-2 viral copies this assay can detect is 138 copies/mL. A negative result does not preclude SARS-Cov-2 infection and should not be used as the sole basis for treatment or other patient management decisions. A negative result may occur with  improper specimen collection/handling, submission of specimen other than nasopharyngeal swab, presence of viral mutation(s) within the areas targeted by this assay, and inadequate number of viral copies(<138 copies/mL). A negative result must be combined with clinical observations, patient  history, and epidemiological information. The expected result is Negative.  Fact Sheet for Patients:  EntrepreneurPulse.com.au  Fact Sheet for Healthcare Providers:  IncredibleEmployment.be  This test is no t yet approved or cleared by the [place] FDA and  has been authorized for detection and/or diagnosis of SARS-CoV-2 by FDA under an Emergency Use Authorization (EUA). This EUA will remain  in effect (meaning this test can be used) for the duration of the COVID-19 declaration under Section 564(b)(1) of the Act, 21 U.S.C.section 360bbb-3(b)(1), unless the authorization is terminated  or revoked sooner.       Influenza A by PCR NEGATIVE NEGATIVE Final   Influenza B by PCR NEGATIVE NEGATIVE Final     Comment: (NOTE) The Xpert Xpress SARS-CoV-2/FLU/RSV plus assay is intended as an aid in the diagnosis of influenza from Nasopharyngeal swab specimens and should not be used as a sole basis for treatment. Nasal washings and aspirates are unacceptable for Xpert Xpress SARS-CoV-2/FLU/RSV testing.  Fact Sheet for Patients: EntrepreneurPulse.com.au  Fact Sheet for Healthcare Providers: IncredibleEmployment.be  This test is not yet approved or cleared by the [place] FDA and has been authorized for detection and/or diagnosis of SARS-CoV-2 by FDA under an Emergency Use Authorization (EUA). This EUA will remain in effect (meaning this test can be used) for the duration of the COVID-19 declaration under Section 564(b)(1) of the Act, 21 U.S.C. section 360bbb-3(b)(1), unless the authorization is terminated or revoked.  Performed at [facility], [address] [place], [place] [address]   Culture, blood (routine x 2)     Status: None   Collection Time: 03/08/21  1:07 AM   Specimen: BLOOD  Result Value Ref Range Status   Specimen Description BLOOD RIGHT HAND  Final   Special Requests   Final    BOTTLES DRAWN AEROBIC AND ANAEROBIC Blood Culture results may not be optimal due to an inadequate volume of blood received in culture bottles   Culture   Final    NO GROWTH 5 DAYS Performed at [facility], [address] [place], [place] [address]    Report Status 03/13/2021 FINAL  Final  Urine Culture     Status: Abnormal   Collection Time: 03/08/21  1:17 AM   Specimen: Urine, Clean Catch  Result Value Ref Range Status   Specimen Description   Final    URINE, CLEAN CATCH Performed at [facility], [address], [place], [place] [address]    Special Requests   Final    NONE Performed at [facility], [place], [place] [address]    Culture (A)  Final    20,000 COLONIES/mL STREPTOCOCCUS  AGALACTIAE TESTING AGAINST S. AGALACTIAE NOT ROUTINELY PERFORMED DUE TO PREDICTABILITY OF AMP/PEN/VAN SUSCEPTIBILITY. Performed at [facility], [place] [address], [place], [place] [address]    Report Status 03/09/2021 FINAL  Final  Culture, blood (routine x 2)     Status: None   Collection Time: 03/08/21  2:02 AM   Specimen: BLOOD  Result Value Ref Range Status   Specimen Description BLOOD RIGHT HAND  Final   Special Requests   Final    BOTTLES DRAWN AEROBIC AND ANAEROBIC Blood Culture adequate volume   Culture   Final    NO GROWTH 5 DAYS Performed at [facility], [address], [place],  [address]    Report Status 03/13/2021 FINAL  Final          Radiology Studies: No results  found.      Scheduled Meds:  apixaban  2.5 mg Oral BID   carvedilol  12.5 mg Oral BID WC   doxazosin  8 mg Oral Q12H   feeding supplement  237 mL Oral TID BM   furosemide  40 mg Oral Daily   insulin aspart  0-15 Units Subcutaneous Q4H   multivitamin with minerals  1 tablet Oral Daily   pantoprazole  40 mg Oral Daily   potassium chloride  40 mEq Oral BID   rosuvastatin  10 mg Oral Daily   traZODone  100 mg Oral QHS   Continuous Infusions:          Aline August, MD [facility] 03/14/2021, 10:03 AM

## 2021-03-14 NOTE — Progress Notes (Signed)
Physical Therapy Treatment Patient Details Name: Timothy Riley MRN: [id number] DOB: 10-10-1940 Today's Date: 03/14/2021   History of Present Illness Timothy Riley is a 81 y.o. male with medical history significant of Persistent A-fib on Eliquis, s/p bioprosthetic AVR, G2 DD (EF 50 to 55% 08/2020), diabetes, nonobstructive CAD with low risk Myoview in 2020, HTN, CKD 3B, who presents to the ED with a 2-day history of vomiting diarrhea and abdominal pain, poor oral intake with inability to hold down anything, including meds.    PT Comments    Pt eager to do some ambulation in the hallway, reports his unsteadiness and need to reach out to steady himself is essentially baseline.  Cuing for cadence, foot/toe clearance, positioning awareness and discussed the benefits of using cane but he did not seem too interested. O2 on room air remained in the low/mid 90s with minimal subjective fatigue.    Recommendations for follow up therapy are one component of a multi-disciplinary discharge planning process, led by the attending physician.  Recommendations may be updated based on patient status, additional functional criteria and insurance authorization.  Follow Up Recommendations  Home health PT (pt not interested in HHPT though could likely help with stability)     Assistance Recommended at Discharge Intermittent Supervision/Assistance  Patient can return home with the following A little help with walking and/or transfers;A little help with bathing/dressing/bathroom;Assistance with cooking/housework;Assist for transportation;Help with stairs or ramp for entrance   Equipment Recommendations  Cane (pt not interested)    Recommendations for Other Services       Precautions / Restrictions Precautions Precautions: Fall Restrictions Weight Bearing Restrictions: No     Mobility  Bed Mobility Overal bed mobility: Modified Independent                  Transfers Overall transfer level:  Modified independent Equipment used: None Transfers: Sit to/from Stand Sit to Stand: Modified independent (Device/Increase time)                Ambulation/Gait Ambulation/Gait assistance: Supervision Gait Distance (Feet): 350 Feet Assistive device: None         General Gait Details: patient reaching out for furniture, counter, walls during ambulation in room and hallway. Occasional unsteadiness that he was able to self arrest, consistently dragging R>L foot  however no overt LOBs   Stairs Stairs: Yes Stairs assistance: Supervision Stair Management: One rail Right Number of Stairs: 6 General stair comments: Pt able to negotiate up/down steps with slow but appropriate step-to strategy, reliant on UEs/rail during negotiation   Wheelchair Mobility    Modified Rankin (Stroke Patients Only)       Balance Overall balance assessment: Needs assistance Sitting-balance support: Feet supported Sitting balance-Leahy Scale: Good     Standing balance support: No upper extremity supported, Single extremity supported, During functional activity Standing balance-Leahy Scale: Fair Standing balance comment: unsteady with mobility. Needing at least intermittent and at times nearly constant single UE support                            Cognition Arousal/Alertness: Awake/alert Behavior During Therapy: WFL for tasks assessed/performed Overall Cognitive Status: Within Functional Limits for tasks assessed                                          Exercises  General Comments General comments (skin integrity, edema, etc.): Pt with slow but steady gait.  Consistently reaching to support self with single UE use.      Pertinent Vitals/Pain      Home Living                          Prior Function            PT Goals (current goals can now be found in the care plan section) Progress towards PT goals: Progressing toward goals     Frequency    Min 2X/week      PT Plan Current plan remains appropriate    Co-evaluation              AM-PAC PT "6 Clicks" Mobility   Outcome Measure  Help needed turning from your back to your side while in a flat bed without using bedrails?: None Help needed moving from lying on your back to sitting on the side of a flat bed without using bedrails?: None Help needed moving to and from a bed to a chair (including a wheelchair)?: None Help needed standing up from a chair using your arms (e.g., wheelchair or bedside chair)?: None Help needed to walk in hospital room?: A Little Help needed climbing 3-5 steps with a railing? : A Little 6 Click Score: 22    End of Session   Activity Tolerance: Patient tolerated treatment well Patient left: in bed;with call bell/phone within reach Nurse Communication: Mobility status PT Visit Diagnosis: Unsteadiness on feet (R26.81);Muscle weakness (generalized) (M62.81);Difficulty in walking, not elsewhere classified (R26.2)     Time: 9678-9381 PT Time Calculation (min) (ACUTE ONLY): 12 min  Charges:  $Gait Training: 8-22 mins                     [name]. DPT 03/14/2021, 3:56 PM

## 2021-03-14 NOTE — Care Management Important Message (Signed)
Important Message  Patient Details  Name: Timothy Riley MRN: [id number] Date of Birth: 1941/01/18   Medicare Important Message Given:  Yes     Juliann Pulse A Selinda Korzeniewski 03/14/2021, 2:30 PM

## 2021-03-14 NOTE — Progress Notes (Addendum)
Progress Note  Patient Name: Timothy Riley Date of Encounter: 03/14/2021  [facility] Cardiologist: Ida Rogue, MD   Subjective   Kidney function stable. BP better, but still elevated. UOP -1.9L. Patient is overall feeling well.   Inpatient Medications    Scheduled Meds:  apixaban  2.5 mg Oral BID   carvedilol  6.25 mg Oral BID WC   doxazosin  8 mg Oral Q12H   feeding supplement  237 mL Oral TID BM   furosemide  40 mg Oral Daily   insulin aspart  0-15 Units Subcutaneous Q4H   multivitamin with minerals  1 tablet Oral Daily   pantoprazole  40 mg Oral Daily   potassium chloride  40 mEq Oral BID   rosuvastatin  10 mg Oral Daily   traZODone  100 mg Oral QHS   Continuous Infusions:  PRN Meds: acetaminophen **OR** acetaminophen, ondansetron **OR** ondansetron (ZOFRAN) IV   Vital Signs    Vitals:   03/13/21 1516 03/14/21 0047 03/14/21 0414 03/14/21 0810  BP: (!) 156/85 (!) 152/76 (!) 159/73 (!) 142/79  Pulse: 68 69 71 73  Resp: 18 18  16   Temp: 98.4 F (36.9 C) 98.7 F (37.1 C)  97.7 F (36.5 C)  TempSrc:  Oral    SpO2: 90% 94% 94% 91%  Weight:      Height:        Intake/Output Summary (Last 24 hours) at 03/14/2021 0856 Last data filed at 03/14/2021 0000 Gross per 24 hour  Intake 240 ml  Output 1800 ml  Net -1560 ml   Last 3 Weights 03/11/2021 03/10/2021 03/09/2021  Weight (lbs) 206 lb 2.1 oz 203 lb 7.8 oz 206 lb 9.1 oz  Weight (kg) 93.5 kg 92.3 kg 93.7 kg      Telemetry    N/A - Personally Reviewed  ECG    No new - Personally Reviewed  Physical Exam   GEN: No acute distress.   Neck: No JVD Cardiac: RRR, no murmurs, rubs, or gallops.  Respiratory: Clear to auscultation bilaterally. GI: Soft, nontender, non-distended  MS: No edema; No deformity. Neuro:  Nonfocal  Psych: Normal affect   Labs    High Sensitivity Troponin:   Recent Labs  Lab 03/07/21 2207  TROPONINIHS 41*     Chemistry Recent Labs  Lab 03/07/21 2207 03/09/21 0320  03/10/21 0442 03/12/21 0358 03/13/21 0427 03/14/21 0535  NA 139 139   < > 140 138 142  K 3.5 3.2*   < > 3.7 4.0 3.6  CL 101 105   < > 96* 92* 96*  CO2 27 29   < > 35* 31 37*  GLUCOSE 166* 120*   < > 79 143* 152*  BUN 22 26*   < > 31* 31* 31*  CREATININE 1.68* 1.96*   < > 1.83* 1.92* 1.85*  CALCIUM 9.2 8.0*   < > 9.0 8.9 9.0  MG  --  2.0   < > 2.0 2.1 2.0  PROT 7.0 5.4*  --   --   --   --   ALBUMIN 3.9 3.0*  --   --   --   --   AST 27 19  --   --   --   --   ALT 12 8  --   --   --   --   ALKPHOS 66 47  --   --   --   --   BILITOT 1.5* 0.8  --   --   --   --  GFRNONAA 40* 34*   < > 36* 34* 36*  ANIONGAP 11 5   < > 9 15 9    < > = values in this interval not displayed.    Lipids No results for input(s): CHOL, TRIG, HDL, LABVLDL, LDLCALC, CHOLHDL in the last 168 hours.  Hematology Recent Labs  Lab 03/07/21 2207 03/09/21 0320 03/10/21 0442  WBC 4.7 3.9* 4.5  RBC 4.05* 3.13* 3.28*  HGB 11.6* 9.0* 9.3*  HCT 37.5* 29.3* 30.5*  MCV 92.6 93.6 93.0  MCH 28.6 28.8 28.4  MCHC 30.9 30.7 30.5  RDW 14.6 15.1 14.9  PLT 157 140* 140*   Thyroid No results for input(s): TSH, FREET4 in the last 168 hours.  BNPNo results for input(s): BNP, PROBNP in the last 168 hours.  DDimer No results for input(s): DDIMER in the last 168 hours.   Radiology    No results found.  Cardiac Studies     2D echo 09/13/2020: 1. Left ventricular ejection fraction, by estimation, is 50 to 55%. The  left ventricle has low normal function. Left ventricular endocardial  border not optimally defined to evaluate regional wall motion. There is  mild left ventricular hypertrophy. Left  ventricular diastolic parameters are consistent with Grade II diastolic  dysfunction (pseudonormalization). Elevated left atrial pressure.   2. Right ventricular systolic function is normal. The right ventricular  size is normal. There is moderately elevated pulmonary artery systolic  pressure.   3. Left atrial size was  moderately dilated.   4. Right atrial size was mildly dilated.   5. The mitral valve is abnormal. Mild mitral valve regurgitation. No  evidence of mitral stenosis.   6. Tricuspid valve regurgitation is mild to moderate.   7. The aortic valve has been repaired/replaced. Aortic valve  regurgitation is mild to moderate. There is a 23 mm Edwards bioprosthetic  valve present in the aortic position. Procedure Date: 03/01/14. Aortic valve  mean gradient measures 13.0 mmHg.   8. The inferior vena cava is dilated in size with >50% respiratory  variability, suggesting right atrial pressure of 8 mmHg.     Patient Profile     81 y.o. male with chronic diastolic heart failure, pulmonary hypertension, persistent afib,s/p bioprosthetic AVR 2016, and diabetes admitted with heart failure.   Assessment & Plan    Acute on chronic diastolic heart failure Pulmonary HTN - IV lasix 40mg  daily - UOP -1.4L, net -7L - Scr stable - PTA lasix 20mg  daily, - IV lasix changed to lasix 40 mg  - continue BB   HTN - metoprolol switched to coreg - BP still up - I will increase Coreg    S/p AVR - continue with serial echocardiograms   Persistent Afib - remains in SR - CHADSVASC at least 4 - continue Eliquis 2.5mg BID - Coreg for rate control   HL - LDL 79 in 2020 - continue statin  For questions or updates, please contact [facility] Please consult [email or web address] for contact info under        Signed, Shadrick Senne Ninfa Meeker, PA-C  03/14/2021, 8:56 AM

## 2021-03-14 NOTE — Progress Notes (Signed)
Inpatient Diabetes Program Recommendations  AACE/ADA: New Consensus Statement on Inpatient Glycemic Control (2015)  Target Ranges:  Prepandial:   less than 140 mg/dL      Peak postprandial:   less than 180 mg/dL (1-2 hours)      Critically ill patients:  140 - 180 mg/dL    Latest Reference Range & Units 03/14/21 00:09 03/14/21 04:34 03/14/21 08:12  Glucose-Capillary 70 - 99 mg/dL 194 (H)  3 units Novolog  176 (H)  3 units Novolog  68 (L)      Home DM Meds: Glipizide 10 mg BID        Actos 30 mg daily  Current Orders: Novolog Moderate Correction Scale/ SSI (0-15 units) Q4 hours     MD- Note Hypoglycemia this AM after pt received 3 units Novolog at 4am.  Please consider reducing Novolog SSI to the 0-9 unit (Sensitive) scale and reduce frequency to TID     --Will follow patient during hospitalization--  Wyn Quaker RN, MSN, CDE Diabetes Coordinator Inpatient Glycemic Control Team Team Pager: [phone] (8a-5p)

## 2021-03-15 DIAGNOSIS — I4819 Other persistent atrial fibrillation: Secondary | ICD-10-CM

## 2021-03-15 LAB — BASIC METABOLIC PANEL
Anion gap: 8 (ref 5–15)
BUN: 29 mg/dL — ABNORMAL HIGH (ref 8–23)
CO2: 33 mmol/L — ABNORMAL HIGH (ref 22–32)
Calcium: 8.7 mg/dL — ABNORMAL LOW (ref 8.9–10.3)
Chloride: 96 mmol/L — ABNORMAL LOW (ref 98–111)
Creatinine, Ser: 2.04 mg/dL — ABNORMAL HIGH (ref 0.61–1.24)
GFR, Estimated: 32 mL/min — ABNORMAL LOW (ref >60)
Glucose, Bld: 128 mg/dL — ABNORMAL HIGH (ref 70–99)
Potassium: 4.1 mmol/L (ref 3.5–5.1)
Sodium: 137 mmol/L (ref 135–145)

## 2021-03-15 LAB — GLUCOSE, CAPILLARY
Glucose-Capillary: 120 mg/dL — ABNORMAL HIGH (ref 70–99)
Glucose-Capillary: 125 mg/dL — ABNORMAL HIGH (ref 70–99)
Glucose-Capillary: 140 mg/dL — ABNORMAL HIGH (ref 70–99)

## 2021-03-15 LAB — MAGNESIUM: Magnesium: 2 mg/dL (ref 1.7–2.4)

## 2021-03-15 MED ORDER — CARVEDILOL 12.5 MG PO TABS: 12.5000 mg | ORAL_TABLET | Freq: Two times a day (BID) | ORAL | 0 refills | Status: DC

## 2021-03-15 MED ORDER — FUROSEMIDE 40 MG PO TABS: 40.0000 mg | ORAL_TABLET | Freq: Every day | ORAL | 0 refills | Status: DC

## 2021-03-15 NOTE — Plan of Care (Signed)
Problem: Education: °Goal: Knowledge of General Education information will improve °Description: Including pain rating scale, medication(s)/side effects and non-pharmacologic comfort measures °03/15/2021 1104 by , , RN °Outcome: Completed/Met °03/15/2021 1052 by , , RN °Outcome: Progressing °  °Problem: Health Behavior/Discharge Planning: °Goal: Ability to manage health-related needs will improve °03/15/2021 1104 by , , RN °Outcome: Completed/Met °03/15/2021 1052 by , , RN °Outcome: Progressing °  °Problem: Clinical Measurements: °Goal: Ability to maintain clinical measurements within normal limits will improve °03/15/2021 1104 by , , RN °Outcome: Completed/Met °03/15/2021 1052 by , , RN °Outcome: Progressing °Goal: Will remain free from infection °03/15/2021 1104 by , , RN °Outcome: Completed/Met °03/15/2021 1052 by , , RN °Outcome: Progressing °Goal: Diagnostic test results will improve °03/15/2021 1104 by , , RN °Outcome: Completed/Met °03/15/2021 1052 by , , RN °Outcome: Progressing °Goal: Respiratory complications will improve °03/15/2021 1104 by , , RN °Outcome: Completed/Met °03/15/2021 1052 by , , RN °Outcome: Progressing °Goal: Cardiovascular complication will be avoided °03/15/2021 1104 by , , RN °Outcome: Completed/Met °03/15/2021 1052 by , , RN °Outcome: Progressing °  °Problem: Activity: °Goal: Risk for activity intolerance will decrease °03/15/2021 1104 by , , RN °Outcome: Completed/Met °03/15/2021 1052 by , , RN °Outcome: Progressing °  °Problem: Nutrition: °Goal: Adequate nutrition will be maintained °03/15/2021 1104 by , , RN °Outcome: Completed/Met °03/15/2021 1052 by , , RN °Outcome: Progressing °  °Problem: Elimination: °Goal:  Will not experience complications related to bowel motility °03/15/2021 1104 by , , RN °Outcome: Completed/Met °03/15/2021 1052 by , , RN °Outcome: Progressing °Goal: Will not experience complications related to urinary retention °03/15/2021 1104 by , , RN °Outcome: Completed/Met °03/15/2021 1052 by , , RN °Outcome: Progressing °  °Problem: Pain Managment: °Goal: General experience of comfort will improve °03/15/2021 1104 by , , RN °Outcome: Completed/Met °03/15/2021 1052 by , , RN °Outcome: Progressing °  °Problem: Safety: °Goal: Ability to remain free from injury will improve °03/15/2021 1104 by , , RN °Outcome: Completed/Met °03/15/2021 1052 by , , RN °Outcome: Progressing °  °Problem: Skin Integrity: °Goal: Risk for impaired skin integrity will decrease °03/15/2021 1104 by , , RN °Outcome: Completed/Met °03/15/2021 1052 by , , RN °Outcome: Progressing °  °

## 2021-03-15 NOTE — Plan of Care (Signed)

## 2021-03-15 NOTE — Progress Notes (Signed)
Progress Note  Patient Name: Timothy Riley Date of Encounter: 03/15/2021  [facility] Cardiologist: Ida Rogue, MD   Subjective   Shortness of breath in bed, denies edema.  Happy to be going home today.  Inpatient Medications    Scheduled Meds:  apixaban  2.5 mg Oral BID   carvedilol  12.5 mg Oral BID WC   doxazosin  8 mg Oral Q12H   feeding supplement (GLUCERNA SHAKE)  237 mL Oral BID BM   furosemide  40 mg Oral Daily   insulin aspart  0-9 Units Subcutaneous TID WC   multivitamin with minerals  1 tablet Oral Daily   pantoprazole  40 mg Oral Daily   potassium chloride  40 mEq Oral BID   rosuvastatin  10 mg Oral Daily   traZODone  100 mg Oral QHS   Continuous Infusions:  PRN Meds: acetaminophen **OR** acetaminophen, ondansetron **OR** ondansetron (ZOFRAN) IV   Vital Signs    Vitals:   03/14/21 1949 03/15/21 0430 03/15/21 0500 03/15/21 0747  BP: (!) 149/79 (!) 168/75  (!) 155/98  Pulse: 70 73  76  Resp: 18 17  16   Temp: 98.3 F (36.8 C)     TempSrc:      SpO2: 95% 93%  94%  Weight:   85.3 kg   Height:        Intake/Output Summary (Last 24 hours) at 03/15/2021 1158 Last data filed at 03/15/2021 0735 Gross per 24 hour  Intake --  Output 390 ml  Net -390 ml   Last 3 Weights 03/15/2021 03/11/2021 03/10/2021  Weight (lbs) 188 lb 0.8 oz 206 lb 2.1 oz 203 lb 7.8 oz  Weight (kg) 85.3 kg 93.5 kg 92.3 kg      Telemetry    Currently not on telemetry- Personally Reviewed  ECG     - Personally Reviewed  Physical Exam   GEN: No acute distress.   Neck: No JVD Cardiac: RRR, no murmurs, rubs, or gallops.  Respiratory: Clear lungs, diminished breath sounds GI: Soft, nontender, non-distended  MS: No edema; No deformity. Neuro:  Nonfocal  Psych: Normal affect   Labs    High Sensitivity Troponin:   Recent Labs  Lab 03/07/21 2207  TROPONINIHS 41*     Chemistry Recent Labs  Lab 03/09/21 0320 03/10/21 0442 03/13/21 0427 03/14/21 0535  03/15/21 0508  NA 139   < > 138 142 137  K 3.2*   < > 4.0 3.6 4.1  CL 105   < > 92* 96* 96*  CO2 29   < > 31 37* 33*  GLUCOSE 120*   < > 143* 152* 128*  BUN 26*   < > 31* 31* 29*  CREATININE 1.96*   < > 1.92* 1.85* 2.04*  CALCIUM 8.0*   < > 8.9 9.0 8.7*  MG 2.0   < > 2.1 2.0 2.0  PROT 5.4*  --   --   --   --   ALBUMIN 3.0*  --   --   --   --   AST 19  --   --   --   --   ALT 8  --   --   --   --   ALKPHOS 47  --   --   --   --   BILITOT 0.8  --   --   --   --   GFRNONAA 34*   < > 34* 36* 32*  ANIONGAP 5   < >  15 9 8    < > = values in this interval not displayed.    Lipids No results for input(s): CHOL, TRIG, HDL, LABVLDL, LDLCALC, CHOLHDL in the last 168 hours.  Hematology Recent Labs  Lab 03/09/21 0320 03/10/21 0442  WBC 3.9* 4.5  RBC 3.13* 3.28*  HGB 9.0* 9.3*  HCT 29.3* 30.5*  MCV 93.6 93.0  MCH 28.8 28.4  MCHC 30.7 30.5  RDW 15.1 14.9  PLT 140* 140*   Thyroid No results for input(s): TSH, FREET4 in the last 168 hours.  BNPNo results for input(s): BNP, PROBNP in the last 168 hours.  DDimer No results for input(s): DDIMER in the last 168 hours.   Radiology    No results found.  Cardiac Studies   TTE 08/2020 1. Left ventricular ejection fraction, by estimation, is 50 to 55%. The  left ventricle has low normal function. Left ventricular endocardial  border not optimally defined to evaluate regional wall motion. There is  mild left ventricular hypertrophy. Left  ventricular diastolic parameters are consistent with Grade II diastolic  dysfunction (pseudonormalization). Elevated left atrial pressure.   2. Right ventricular systolic function is normal. The right ventricular  size is normal. There is moderately elevated pulmonary artery systolic  pressure.   3. Left atrial size was moderately dilated.   4. Right atrial size was mildly dilated.   5. The mitral valve is abnormal. Mild mitral valve regurgitation. No  evidence of mitral stenosis.   6. Tricuspid  valve regurgitation is mild to moderate.   7. The aortic valve has been repaired/replaced. Aortic valve  regurgitation is mild to moderate. There is a 23 mm Edwards bioprosthetic  valve present in the aortic position. Procedure Date: 03/01/14. Aortic valve  mean gradient measures 13.0 mmHg.   8. The inferior vena cava is dilated in size with >50% respiratory  variability, suggesting right atrial pressure of 8 mmHg.   Patient Profile     81 y.o. male with history of HFpEF, persistent A-fib, aortic valve replacement/bioprosthesis, CKD presenting with worsening shortness of breath and edema, being seen for HFpEF.  Assessment & Plan    HFpEF -Appears euvolemic, no edema -Shortness of breath improved -P.o. Lasix 40 mg daily -Okay for discharge today.  2.  Persistent A-fib -Appears to be in sinus rhythm today -Continue Coreg, Eliquis 2.5 mg twice daily  3.  CKD -Creatinine appears stable -Close monitoring with outpatient  Patient can be discharged from a cardiac perspective.  Close follow-up as outpatient advised.   Total encounter time more than 50 minutes  Greater than 50% was spent in counseling and coordination of care with the patient     Signed, Kate Sable, MD  03/15/2021, 11:58 AM

## 2021-03-15 NOTE — Discharge Summary (Signed)
Physician Discharge Summary  Timothy Riley CBJ:[id number] DOB: 1940/09/28 DOA: 03/07/2021  PCP: Derinda Late, MD  Admit date: 03/07/2021 Discharge date: 03/15/2021  Discharge disposition: Home   Recommendations for Outpatient Follow-Up:   Follow-up with PCP in 1 week Follow-up with cardiologist as scheduled   Discharge Diagnosis:   Principal Problem:   Gastroenteritis Active Problems:   S/P AVR (aortic valve replacement)   Essential hypertension   Stage 3b chronic kidney disease ([place])   CAD (coronary artery disease)   Atrial fibrillation ([place])   Controlled type 2 diabetes mellitus without complication, without long-term current use of insulin (H[facility])   Chronic anticoagulation   Pleural effusion, bilateral   Acute on chronic heart failure with preserved ejection fraction (HFpEF) ([place])   Acute gastroenteritis    Discharge Condition: Stable.  Diet recommendation:  Diet Order             Diet - low sodium heart healthy           Diet heart healthy/carb modified Room service appropriate? Yes; Fluid consistency: Thin; Fluid restriction: 1500 mL Fluid  Diet effective now                     Code Status: Full Code     Hospital Course:   [name] with medical history significant for persistent atrial fibrillation on Eliquis, s/p bioprosthetic AVR, chronic diastolic CHF (EF estimated at 19 to 55% in August 2022) type 2 diabetes mellitus, nonobstructive CAD, hypertension, CKD stage IIIb, who presented to the hospital with vomiting, diarrhea, abdominal pain and poor oral intake.  He was admitted to the hospital for gastroenteritis complicated by acute kidney injury.  He was treated with IV fluids.  Subsequently, he developed fluid overload and acute exacerbation of chronic diastolic CHF requiring treatment with IV Lasix.  He had hypokalemia that was repleted.  His condition has improved and he is deemed stable for discharge to home today.    Medical  Consultants:   Cardiologist   Discharge Exam:    Vitals:   03/14/21 1949 03/15/21 0430 03/15/21 0500 03/15/21 0747  BP: (!) 149/79 (!) 168/75  (!) 155/98  Pulse: 70 73  76  Resp: 18 17  16   Temp: 98.3 F (36.8 C)     TempSrc:      SpO2: 95% 93%  94%  Weight:   85.3 kg   Height:         GEN: NAD SKIN: Warm and dry EYES: No pallor or icterus ENT: MMM CV: RRR PULM: CTA B ABD: soft, ND, NT, +BS CNS: AAO x 3, non focal EXT: No edema or tenderness   The results of significant diagnostics from this hospitalization (including imaging, microbiology, ancillary and laboratory) are listed below for reference.     Procedures and Diagnostic Studies:   CT Abdomen Pelvis Wo Contrast  Result Date: 03/07/2021 CLINICAL DATA:  Nausea, vomiting, and diarrhea. EXAM: CT ABDOMEN AND PELVIS WITHOUT CONTRAST TECHNIQUE: Multidetector CT imaging of the abdomen and pelvis was performed following the standard protocol without IV contrast. RADIATION DOSE REDUCTION: This exam was performed according to the departmental dose-optimization program which includes automated exposure control, adjustment of the mA and/or kV according to patient size and/or use of iterative reconstruction technique. COMPARISON:  None. FINDINGS: Lower chest: Moderate bilateral pleural effusions with basilar atelectasis. Mild cardiac enlargement. Hepatobiliary: Cholelithiasis with distended gallbladder. No wall thickening or inflammatory stranding. No focal liver lesions. Pancreas: Unremarkable. No pancreatic ductal  dilatation or surrounding inflammatory changes. Spleen: Mild splenic enlargement.  Calcified granuloma. Adrenals/Urinary Tract: No adrenal gland nodules. No renal or ureteral stone or obstruction. Mild stranding around the kidneys likely due to edema. Bladder wall is diffusely thickened, suggesting cystitis. Correlate with urinalysis. Stomach/Bowel: Stomach, small bowel, and colon are not abnormally distended. No wall  thickening or inflammatory changes are appreciated. Scattered colonic diverticula without evidence of acute diverticulitis. Appendix is normal. Vascular/Lymphatic: Aortic atherosclerosis. No enlarged abdominal or pelvic lymph nodes. Reproductive: Prostate is unremarkable. Other: Small amount of free fluid in the upper abdomen and along the pericolic gutters. No free air. Minimal periumbilical hernia containing fat. Musculoskeletal: Degenerative changes in the spine. IMPRESSION: 1. Bilateral pleural effusions with basilar atelectasis. 2. Small volume abdominal and pelvic ascites with edema around the kidneys. No loculated collections. 3. No bowel obstruction or inflammation. 4. Cholelithiasis without evidence of acute cholecystitis. 5. Mild splenic enlargement. 6. Aortic atherosclerosis. Electronically Signed   By: Lucienne Capers M.D.   On: 03/07/2021 23:33     Labs:   Basic Metabolic Panel: Recent Labs  Lab 03/11/21 0415 03/12/21 0358 03/13/21 0427 03/14/21 0535 03/15/21 0508  NA 141 140 138 142 137  K 4.1 3.7 4.0 3.6 4.1  CL 101 96* 92* 96* 96*  CO2 31 35* 31 37* 33*  GLUCOSE 92 79 143* 152* 128*  BUN 29* 31* 31* 31* 29*  CREATININE 1.68* 1.83* 1.92* 1.85* 2.04*  CALCIUM 8.7* 9.0 8.9 9.0 8.7*  MG 2.0 2.0 2.1 2.0 2.0   GFR Estimated Creatinine Clearance: 28 mL/min (A) (by C-G formula based on SCr of 2.04 mg/dL (H)). Liver Function Tests: Recent Labs  Lab 03/09/21 0320  AST 19  ALT 8  ALKPHOS 47  BILITOT 0.8  PROT 5.4*  ALBUMIN 3.0*   No results for input(s): LIPASE, AMYLASE in the last 168 hours. No results for input(s): AMMONIA in the last 168 hours. Coagulation profile No results for input(s): INR, PROTIME in the last 168 hours.  CBC: Recent Labs  Lab 03/09/21 0320 03/10/21 0442  WBC 3.9* 4.5  NEUTROABS 2.3 3.0  HGB 9.0* 9.3*  HCT 29.3* 30.5*  MCV 93.6 93.0  PLT 140* 140*   Cardiac Enzymes: No results for input(s): CKTOTAL, CKMB, CKMBINDEX, TROPONINI in the  last 168 hours. BNP: Invalid input(s): POCBNP CBG: Recent Labs  Lab 03/14/21 1626 03/14/21 1950 03/15/21 0122 03/15/21 0436 03/15/21 0822  GLUCAP 175* 203* 120* 125* 140*   D-Dimer No results for input(s): DDIMER in the last 72 hours. Hgb A1c No results for input(s): HGBA1C in the last 72 hours. Lipid Profile No results for input(s): CHOL, HDL, LDLCALC, TRIG, CHOLHDL, LDLDIRECT in the last 72 hours. Thyroid function studies No results for input(s): TSH, T4TOTAL, T3FREE, THYROIDAB in the last 72 hours.  Invalid input(s): FREET3 Anemia work up No results for input(s): VITAMINB12, FOLATE, FERRITIN, TIBC, IRON, RETICCTPCT in the last 72 hours. Microbiology Recent Results (from the past 240 hour(s))  Resp Panel by RT-PCR (Flu A&B, Covid) Nasopharyngeal Swab     Status: None   Collection Time: 03/07/21 11:21 PM   Specimen: Nasopharyngeal Swab; Nasopharyngeal(NP) swabs in vial transport medium  Result Value Ref Range Status   SARS Coronavirus 2 by RT PCR NEGATIVE NEGATIVE Final    Comment: (NOTE) SARS-CoV-2 target nucleic acids are NOT DETECTED.  The SARS-CoV-2 RNA is generally detectable in upper respiratory specimens during the acute phase of infection. The lowest concentration of SARS-CoV-2 viral copies this assay can detect  is 138 copies/mL. A negative result does not preclude SARS-Cov-2 infection and should not be used as the sole basis for treatment or other patient management decisions. A negative result may occur with  improper specimen collection/handling, submission of specimen other than nasopharyngeal swab, presence of viral mutation(s) within the areas targeted by this assay, and inadequate number of viral copies(<138 copies/mL). A negative result must be combined with clinical observations, patient history, and epidemiological information. The expected result is Negative.  Fact Sheet for Patients:  EntrepreneurPulse.com.au  Fact Sheet for  Healthcare Providers:  IncredibleEmployment.be  This test is no t yet approved or cleared by the [place] FDA and  has been authorized for detection and/or diagnosis of SARS-CoV-2 by FDA under an Emergency Use Authorization (EUA). This EUA will remain  in effect (meaning this test can be used) for the duration of the COVID-19 declaration under Section 564(b)(1) of the Act, 21 U.S.C.section 360bbb-3(b)(1), unless the authorization is terminated  or revoked sooner.       Influenza A by PCR NEGATIVE NEGATIVE Final   Influenza B by PCR NEGATIVE NEGATIVE Final    Comment: (NOTE) The Xpert Xpress SARS-CoV-2/FLU/RSV plus assay is intended as an aid in the diagnosis of influenza from Nasopharyngeal swab specimens and should not be used as a sole basis for treatment. Nasal washings and aspirates are unacceptable for Xpert Xpress SARS-CoV-2/FLU/RSV testing.  Fact Sheet for Patients: EntrepreneurPulse.com.au  Fact Sheet for Healthcare Providers: IncredibleEmployment.be  This test is not yet approved or cleared by the [place] FDA and has been authorized for detection and/or diagnosis of SARS-CoV-2 by FDA under an Emergency Use Authorization (EUA). This EUA will remain in effect (meaning this test can be used) for the duration of the COVID-19 declaration under Section 564(b)(1) of the Act, 21 U.S.C. section 360bbb-3(b)(1), unless the authorization is terminated or revoked.  Performed at [facility], [address] [place], [place] [address]   Culture, blood (routine x 2)     Status: None   Collection Time: 03/08/21  1:07 AM   Specimen: BLOOD  Result Value Ref Range Status   Specimen Description BLOOD RIGHT HAND  Final   Special Requests   Final    BOTTLES DRAWN AEROBIC AND ANAEROBIC Blood Culture results may not be optimal due to an inadequate volume of blood received in culture bottles   Culture   Final     NO GROWTH 5 DAYS Performed at [facility], [address], [place], [place] [address]    Report Status 03/13/2021 FINAL  Final  Urine Culture     Status: Abnormal   Collection Time: 03/08/21  1:17 AM   Specimen: Urine, Clean Catch  Result Value Ref Range Status   Specimen Description   Final    URINE, CLEAN CATCH Performed at [facility], [address], [place], [place] [address]    Special Requests   Final    NONE Performed at [facility], [place], [place] [address]    Culture (A)  Final    20,000 COLONIES/mL STREPTOCOCCUS AGALACTIAE TESTING AGAINST S. AGALACTIAE NOT ROUTINELY PERFORMED DUE TO PREDICTABILITY OF AMP/PEN/VAN SUSCEPTIBILITY. Performed at [facility], [place] [address] [place], [place] [address]    Report Status 03/09/2021 FINAL  Final  Culture, blood (routine x 2)     Status: None   Collection Time: 03/08/21  2:02 AM   Specimen: BLOOD  Result Value Ref Range Status   Specimen  Description BLOOD RIGHT HAND  Final   Special Requests   Final    BOTTLES DRAWN AEROBIC AND ANAEROBIC Blood Culture adequate volume   Culture   Final    NO GROWTH 5 DAYS Performed at [facility], [address] [place], [place] [address]    Report Status 03/13/2021 FINAL  Final     Discharge Instructions:   Discharge Instructions     Diet - low sodium heart healthy   Complete by: As directed    Increase activity slowly   Complete by: As directed       Allergies as of 03/15/2021       Reactions   Gabapentin Other (See Comments), Rash   dizzy dizzy   Hydralazine Anxiety, Rash   Hydralazine Hcl Rash        Medication List     STOP taking these medications    metoprolol succinate 25 MG 24 hr tablet Commonly known as: TOPROL-XL   predniSONE 10 MG tablet Commonly known as: DELTASONE       TAKE these medications    amoxicillin 500 MG capsule Commonly known as: AMOXIL TAKE 4 CAPSULES 1 HOUR  BEFORE DENTAL APPOINTMENT   carvedilol 12.5 MG tablet Commonly known as: COREG Take 1 tablet (12.5 mg total) by mouth 2 (two) times daily with a meal.   doxazosin 8 MG tablet Commonly known as: Cardura Take 1 tablet (8 mg total) by mouth 2 (two) times daily.   Eliquis 2.5 MG Tabs tablet Generic drug: apixaban TAKE 1 TABLET BY MOUTH TWICE A DAY   furosemide 40 MG tablet Commonly known as: LASIX Take 1 tablet (40 mg total) by mouth daily. What changed:  medication strength how much to take   glipiZIDE 10 MG tablet Commonly known as: GLUCOTROL TAKE 1 TABLET BY MOUTH TWICE A DAY BEFORE A MEAL.   pioglitazone 30 MG tablet Commonly known as: ACTOS TAKE 1 TABLET BY MOUTH ONCE DAILY   rosuvastatin 10 MG tablet Commonly known as: CRESTOR Take 1 tablet (10 mg total) by mouth daily.   VITAMIN D3 PO Take by mouth daily.           If you experience worsening of your admission symptoms, develop shortness of breath, life threatening emergency, suicidal or homicidal thoughts you must seek medical attention immediately by calling 911 or calling your MD immediately  if symptoms less severe.   You must read complete instructions/literature along with all the possible adverse reactions/side effects for all the medicines you take and that have been prescribed to you. Take any new medicines after you have completely understood and accept all the possible adverse reactions/side effects.    Please note   You were cared for by a hospitalist during your hospital stay. If you have any questions about your discharge medications or the care you received while you were in the hospital after you are discharged, you can call the unit and asked to speak with the hospitalist on call if the hospitalist that took care of you is not available. Once you are discharged, your primary care physician will handle any further medical issues. Please note that NO REFILLS for any discharge medications will be  authorized once you are discharged, as it is imperative that you return to your primary care physician (or establish a relationship with a primary care physician if you do not have one) for your aftercare needs so that they can reassess your need for medications and monitor your lab values.  Time coordinating discharge: Greater than 30 minutes  Signed:  Liese Dizdarevic  [facility] 03/15/2021, 10:54 AM   Pager on [email or web address]. If 7PM-7AM, please contact night-coverage at [email or web address]

## 2021-03-15 NOTE — Progress Notes (Signed)
Discharge note:  Pt given discharge instructions, pt verbalized understanding. Pt waiting on son for transportation.

## 2021-03-26 NOTE — Progress Notes (Unsigned)
Cardiology Office Note  Date:  03/26/2021   ID:  [name], DOB 10/27/1940, MRN [id number]  PCP:  Derinda Late, MD   No chief complaint on file.   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 at [facility] with bovine valve,  SVT episodes since his AVR, requiring ablation 06/30/2014 at [facility],  diabetes type 2  BPH,  normal ejection fraction  who presents for routine follow-up of his bioprosthetic valve, hypertension , atrial fibrillation  Last seen in clinic Oct 2021 In follow-up today reports that he stopped metoprolol, " BP was fine"  Prior lab work reviewed with him A1C 6.4  He is troubled by his chronic leg swelling Has 1+ edema b/l , left more 2+, right 1 + Taking Lasix 1-2 times per week, moderated dose secondary to GFR 25  132/60 at home  Concerned about physician turn-over at [place] office Changing to [facility] PMD  Active, no exercise Eating more bread, weight trending upwards  CR 2.33, GFR 25, stable Declining follow up with nephrology  Wife passed away last year,  debility, memory issues Recent bout on his son, pain scale active  EKG personally reviewed by myself on todays visit NSR rate 65 bpm RBBB  Other past medical history reviewed Echo 03/2018,   1. The left ventricle has normal systolic function with an ejection fraction of 60-65%. The cavity size was normal. There is mildly increased left ventricular wall thickness. Left ventricular diastolic Doppler parameters are consistent with impaired  relaxation.  2. The right ventricle has normal systolic function. The cavity was normal. There is no increase in right ventricular wall thickness.  3. Left atrial size was mildly dilated.  4. 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 Procedure Date: 03/01/14 Normal aortic valve prosthesis. AV Mean Grad: 16.0  mmHg  5. 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  PSH:    Past Surgical History:  Procedure Laterality Date   AORTIC VALVE REPLACEMENT     CARDIAC CATHETERIZATION     CARDIOVERSION N/A 01/01/2018   Procedure: CARDIOVERSION;  Surgeon: Minna Merritts, MD;  Location: [facility] ORS;  Service: Cardiovascular;  Laterality: N/A;   COLONOSCOPY  2010   heart valve replacment  2015   XI ROBOTIC ASSISTED INGUINAL HERNIA REPAIR WITH MESH Right 01/07/2019   Procedure: XI ROBOTIC ASSISTED INGUINAL HERNIA REPAIR WITH MESH;  Surgeon: Ronny Bacon, MD;  Location: [facility] ORS;  Service: General;  Laterality: Right;    Current Outpatient Medications  Medication Sig Dispense Refill  amoxicillin (AMOXIL) 500 MG capsule TAKE 4 CAPSULES 1 HOUR BEFORE DENTAL APPOINTMENT 4 capsule 2   carvedilol (COREG) 12.5 MG tablet Take 1 tablet (12.5 mg total) by mouth 2 (two) times daily with a meal. 60 tablet 0    Cholecalciferol (VITAMIN D3 PO) Take by mouth daily.     doxazosin (CARDURA) 8 MG tablet Take 1 tablet (8 mg total) by mouth 2 (two) times daily. 60 tablet 11   ELIQUIS 2.5 MG TABS tablet TAKE 1 TABLET BY MOUTH TWICE A DAY 180 tablet 1   furosemide (LASIX) 40 MG tablet Take 1 tablet (40 mg total) by mouth daily. 30 tablet 0   glipiZIDE (GLUCOTROL) 10 MG tablet TAKE 1 TABLET BY MOUTH TWICE A DAY BEFORE A MEAL. 180 tablet 1   pioglitazone (ACTOS) 30 MG tablet TAKE 1 TABLET BY MOUTH ONCE DAILY 90 tablet 1   rosuvastatin (CRESTOR) 10 MG tablet Take 1 tablet (10 mg total) by mouth daily. 90 tablet 0   No current facility-administered medications for this visit.    Allergies:   Gabapentin, Hydralazine, and Hydralazine hcl   Social History:  The patient  reports that he quit smoking about 49 years ago. His smoking use included cigarettes. He has a 0.50 pack-year smoking history. He has never used smokeless tobacco. He reports that he does not drink alcohol and does not use drugs.   Family History:   family history includes Cancer in his mother; Stroke in his father.   Review of Systems: Review of Systems  Constitutional: Negative.   HENT: Negative.    Respiratory: Negative.    Cardiovascular:  Positive for leg swelling.  Gastrointestinal: Negative.   Musculoskeletal: Negative.   Neurological: Negative.   Psychiatric/Behavioral: Negative.    All other systems reviewed and are negative.  PHYSICAL EXAM: VS:  There were no vitals taken for this visit. , BMI There is no height or weight on file to calculate BMI. Constitutional:  oriented to person, place, and time. No distress.  HENT:  Head: Grossly normal Eyes:  no discharge. No scleral icterus.  Neck: No JVD, no carotid bruits  Cardiovascular: Regular rate and rhythm, 1-2 SEM RSB appreciated 1+ pitting edema bilateral lower extremities Pulmonary/Chest: Clear to auscultation bilaterally, no wheezes or rails Abdominal: Soft.  no  distension.  no tenderness.  Musculoskeletal: Normal range of motion Neurological:  normal muscle tone. Coordination normal. No atrophy Skin: Skin warm and dry Psychiatric: normal affect, pleasant   Recent Labs: 02/22/2021: BNP 938.3; TSH 6.400 03/09/2021: ALT 8 03/10/2021: Hemoglobin 9.3; Platelets 140 03/15/2021: BUN 29; Creatinine, Ser 2.04; Magnesium 2.0; Potassium 4.1; Sodium 137    Lipid Panel Lab Results  Component Value Date   CHOL 135 12/23/2018   HDL 28 (L) 12/23/2018   LDLCALC 79 12/23/2018   TRIG 185 (H) 12/23/2018    Wt Readings from Last 3 Encounters:  03/15/21 188 lb 0.8 oz (85.3 kg)  02/22/21 219 lb (99.3 kg)  05/11/20 218 lb 4 oz (99 kg)     ASSESSMENT AND PLAN:  Atrial fibrillation, persistent Maintaining normal sinus rhythm Prior cardioversion   low-dose amiodarone, Eliquis 5 twice daily Held the metoprolol on his own, felt it was only for blood pressure  Acute on chronic renal failure Previously followed by nephrology, creatinine greater than 2 GFR 25, stable Continues to take Lasix 1-2 times per week depending with leg swelling Encouraged him to have lab work done through primary care every 6 months  Leg swelling Likely  exacerbated by amlodipine Recommended he stop the amlodipine Compression hose  HTN Hold amlodipine, likely contributing to leg swelling  increase Cardura up to 8 twice daily Monitor blood pressure at home, for near syncope recommended to call our office  Diabetes: Stable, HBA1C 6.5 Will have repeat lab work with primary care  Arteriosclerosis of coronary artery - Plan: EKG 12-Lead Currently with no symptoms of angina. No further workup at this time. Continue current medication regimen. Cholesterol close to goal  S/P AVR (aortic valve replacement) - Plan: EKG 12-Lead Echocardiogram 03/2018 stable prosthetic valve Repeat echocardiogram 6 months  Hyperlipidemia Continue crestor 10 stable   Total encounter time more than  25 minutes  Greater than 50% was spent in counseling and coordination of care with the patient     No orders of the defined types were placed in this encounter.    Signed, Esmond Plants, M.D., Ph.D. 03/26/2021  [place], [place]

## 2021-03-27 ENCOUNTER — Ambulatory Visit: Payer: Medicare Other | Admitting: Cardiovascular Disease

## 2021-03-27 DIAGNOSIS — I10 Essential (primary) hypertension: Secondary | ICD-10-CM

## 2021-03-27 DIAGNOSIS — N183 Chronic kidney disease, stage 3 unspecified: Secondary | ICD-10-CM

## 2021-03-27 DIAGNOSIS — G4730 Sleep apnea, unspecified: Secondary | ICD-10-CM

## 2021-03-27 DIAGNOSIS — I2510 Atherosclerotic heart disease of native coronary artery without angina pectoris: Secondary | ICD-10-CM

## 2021-03-27 DIAGNOSIS — Z953 Presence of xenogenic heart valve: Secondary | ICD-10-CM

## 2021-03-27 DIAGNOSIS — R0602 Shortness of breath: Secondary | ICD-10-CM

## 2021-03-27 DIAGNOSIS — I519 Heart disease, unspecified: Secondary | ICD-10-CM

## 2021-03-27 DIAGNOSIS — I4819 Other persistent atrial fibrillation: Secondary | ICD-10-CM

## 2021-03-28 ENCOUNTER — Encounter: Payer: Self-pay | Admitting: Cardiovascular Disease

## 2021-03-28 ENCOUNTER — Telehealth: Payer: Self-pay | Admitting: Cardiovascular Disease

## 2021-03-28 NOTE — Telephone Encounter (Signed)
Pt c/o medication issue:  1. Name of Medication: carvedilol   2. How are you currently taking this medication (dosage and times per day)? 12.5 MG 1 tablet 2 times daily   3. Are you having a reaction (difficulty breathing--STAT)? Food doesn't taste right   4. What is your medication issue? Patient would like to know who prescribed this medication and states that he hasn't been able to eat right since starting.  Please call to discuss, if unable to get, please call son cell [phone].

## 2021-03-28 NOTE — Telephone Encounter (Signed)
Patient calling to check on status  Informed patient we are awaiting Dr Rockey Situ response

## 2021-03-29 NOTE — Telephone Encounter (Signed)
Patient returning call.

## 2021-03-29 NOTE — Telephone Encounter (Signed)
Attempted to call patient, no answer, no voicemail set up.  ? ?Called patient's son, OK per DPR. Son reports that patient has been complaining about food not tasting right for several weeks, and is attributing it to the carvedilol. Has also been having night sweats and thinks it must be from one of the medications that he's on.  ? ?Explained that it likely wasn't the carvedilol causing the change in taste, and none of the medications on his medication list were likely to blame for it. Son asked if he could still be having residual effects from when he had [place] in December. Explained that that was very possible, since he had lost his sense of smell and taste when sick with COVID.  ? ?Likely night sweats aren't due to medication, however explained that I wasn't completely sure. Explained that that would be something that should be discussed with the MD, and noted with the son that the patient had been a no show for his 2/27 appt with Dr. Rockey Situ. Son was not aware of this.  ? ?Patient has also been complaining that he doesn't want to take the lasix anymore. Son reports that patient recently was in the hospital and they were able to get a lot of fluid off, and patient keeps stating that he thinks he's supposed to stop taking the lasix. Son continually ensures patient that he is supposed to continue lasix.  ? ?Son stated that he would pass on the information given him to patient. Encouraged son to have his father call to reschedule the appointment that he missed earlier this week.  ?

## 2021-04-19 ENCOUNTER — Other Ambulatory Visit: Payer: Self-pay | Admitting: Cardiovascular Disease

## 2021-04-19 NOTE — Telephone Encounter (Signed)
Please review

## 2021-04-19 NOTE — Telephone Encounter (Signed)
Eliquis 2.5 mg refill request received. Patient is 81 years old, weight- 85.3 kg, Crea- 2.5 on 03/30/21, Diagnosis- afib, and last seen by Tarri Glenn, PA on 02/22/21. Dose is appropriate based on dosing criteria. Will send in refill to requested pharmacy.   ?

## 2021-04-24 NOTE — Progress Notes (Signed)
Cardiology Office Note ? ?Date:  04/25/2021  ? ?ID:  Timothy Riley, DOB [birth year], MRN [id number] ? ?PCP:  Derinda Late, MD  ? ?Chief Complaint  ?Patient presents with  ? 2 week follow up   ?  Patient c/o LE edema, abdominal swelling & fatigue. Medications reviewed by the patient verbally.   ? ? 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 ?LOV with myself in 4/22 ?In hosptal  2/23, records reviewed, diastolic CHF ?May not have been taking his Lasix as outpatient ?Treated with IV Lasix, was in normal sinus rhythm ?Discharged on Lasix 40 daily ?Weight at home 190  after he got home ?Since then weight has been trending upwards, now up 15 pounds ? ?Weight yesterday 203 ?Reports he is not eating very much.  Lasix does not appear to be working well for him ?Significant leg swelling, abdominal distention, shortness of breath ?In atrial fibrillation today ? ?Was given Bumex to take with his Lasix by primary care ?No improvement in symptoms ? ?Wife passed away 2 years ago ? ?EKG personally reviewed by myself on todays visit ?Atrial fibrillation rate 103 bpm right bundle branch block ? ?Other past medical history reviewed ?Echo 03/2018,  ? 1. The left ventricle has normal systolic function with an ejection fraction of 60-65%. The cavity size was normal. There is mildly increased left ventricular wall thickness. Left ventricular diastolic Doppler parameters are consistent with impaired  ?relaxation. ? 2. The right ventricle has normal systolic function. The cavity was normal. There is no increase in right ventricular wall  thickness. ? 3. Left atrial size was mildly dilated. ? 4. 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 Procedure Date: 03/01/14 Normal aortic valve prosthesis. AV Mean Grad: 16.0 mmHg ? 5. Right atrial pressure is estimated at 10 mmHg ? ?Went to the emergency room November 05, 2017 ?atrial fibrillation,  ?Started on anticoagulation at that time, eliquis 5 BID ? ? hypotensive on a previous clinic visit, stopped his tamsulosin, chlorthalidone ?  ?Previous notes from outside cardiologist says he has mild coronary artery disease, details not available through care everywhere ?Followed by endocrine for his diabetes ?  ?Prior episodes of SVT requiring adenosine ? ?  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 ? ?PSH:    ?Past Surgical History:  ?Procedure Laterality Date  ? AORTIC VALVE REPLACEMENT    ? CARDIAC CATHETERIZATION    ? CARDIOVERSION N/A 01/01/2018  ? Procedure: CARDIOVERSION;  Surgeon: Minna Merritts, MD;  Location: [facility] ORS;  Service: Cardiovascular;  Laterality: N/A;  ? COLONOSCOPY  2010  ? heart valve replacment  2015  ? XI ROBOTIC ASSISTED INGUINAL HERNIA REPAIR WITH MESH Right 01/07/2019  ?  Procedure: XI ROBOTIC ASSISTED INGUINAL HERNIA REPAIR WITH MESH;  Surgeon: Ronny Bacon, MD;  Location: [facility] ORS;  Service: General;  Laterality: Right;  ? ? ?Current Outpatient Medications  ?Medication Sig Dispense Refill  ? bumetanide (BUMEX) 1  MG tablet Take 1 mg by mouth daily. Take for 5 days; Patient took the last pill on 04/25/21    ? carvedilol (COREG) 12.5 MG tablet Take 1 tablet (12.5 mg total) by mouth 2 (two) times daily with a meal. 60 tablet 0  ? Cholecalciferol (VITAMIN D3 PO) Take by mouth daily.    ? doxazosin (CARDURA) 8 MG tablet Take 1 tablet (8 mg total) by mouth 2 (two) times daily. 60 tablet 11  ? ELIQUIS 2.5 MG TABS tablet TAKE 1 TABLET BY MOUTH TWICE A DAY 180 tablet 1  ? furosemide (LASIX) 40 MG tablet Take 1 tablet (40 mg total) by mouth daily. 30 tablet 0  ? glipiZIDE (GLUCOTROL) 10 MG tablet TAKE 1 TABLET BY MOUTH TWICE A DAY BEFORE A MEAL. (Patient taking differently: Take 10 mg by mouth daily before breakfast.) 180 tablet 1  ? pioglitazone (ACTOS) 30 MG tablet TAKE 1 TABLET BY MOUTH ONCE DAILY 90 tablet 1  ? rosuvastatin (CRESTOR) 10 MG tablet Take 1 tablet (10 mg total) by mouth daily. 90 tablet 0  ? amoxicillin (AMOXIL) 500 MG capsule TAKE 4 CAPSULES 1 HOUR BEFORE DENTAL APPOINTMENT (Patient not taking: Reported on 04/25/2021) 4 capsule 2  ? ?No current facility-administered medications for this visit.  ? ? ?Allergies:   Gabapentin, Hydralazine, and Hydralazine hcl  ? ?Social History:  The patient  reports that he quit smoking about 49 years ago. His smoking use included cigarettes. He has a 0.50 pack-year smoking history. He has never used smokeless tobacco. He reports that he does not drink alcohol and does not use drugs.  ? ?Family History:   family history includes Cancer in his mother; Stroke in his father.  ? ?Review of Systems: ?Review of Systems  ?Constitutional: Negative.   ?HENT: Negative.    ?Respiratory: Negative.    ?Cardiovascular:  Positive for leg swelling.  ?Gastrointestinal: Negative.   ?Musculoskeletal: Negative.   ?Neurological: Negative.   ?Psychiatric/Behavioral: Negative.    ?All other systems reviewed and are negative. ? ?PHYSICAL EXAM: ?VS:  BP 110/60 (BP Location: Left Arm, Patient Position: Sitting,  Cuff Size: Normal)   Pulse (!) 103   Ht '5\' 6"'$  (1.676 m)   Wt 205 lb 6 oz (93.2 kg)   SpO2 92%   BMI 33.15 kg/m?  , BMI Body mass index is 33.15 kg/m?Marland Kitchen ?Constitutional:  oriented to person, place, and time. No distress.  ?HENT:  ?Head: Grossly normal ?Eyes:  no discharge. No scleral icterus.  ?Neck:  JVD 10+, no carotid bruits  ?Cardiovascular: Irregularly irregular, no murmurs appreciated ?2+ pitting lower extremity edema into the abdomen ?Pulmonary/Chest: Clear to auscultation bilaterally, no wheezes or rails ?Abdominal: Soft.  no distension.  no tenderness.  ?Musculoskeletal: Normal range of motion ?Neurological:  normal muscle tone. Coordination normal. No atrophy ?Skin: Skin warm and dry ?Psychiatric: normal affect, pleasant ? ?Recent Labs: ?02/22/2021: BNP 938.3; TSH 6.400 ?03/09/2021: ALT 8 ?03/10/2021: Hemoglobin 9.3; Platelets 140 ?03/15/2021: BUN 29; Creatinine, Ser 2.04; Magnesium 2.0; Potassium 4.1; Sodium 137  ? ? ?Lipid Panel ?Lab Results  ?Component Value Date  ? CHOL 135 12/23/2018  ? HDL 28 (L) 12/23/2018  ? [place] 79 12/23/2018  ? TRIG 185 (H) 12/23/2018  ? ? ?Wt Readings from  Last 3 Encounters:  ?04/25/21 205 lb 6 oz (93.2 kg)  ?03/15/21 188 lb 0.8 oz (85.3 kg)  ?02/22/21 219 lb (99.3 kg)  ?  ? ?ASSESSMENT AND PLAN: ? ?Atrial fibrillation, persistent ?Prior cardioversion  ?Back into atrial fibrillation today likely contributing to worsening CHF symptoms ?For now we have recommended he continue lEliquis 5 twice daily, Coreg ?Amiodarone may have been held in the past June 2022 for underlying thyroid disorder. ?We will need aggressive diuresis before any attempt at restoring normal sinus rhythm ? ?Acute on chronic renal failure ?Previously followed by nephrology, creatinine greater than 2 ?Unable to exclude cardiorenal syndrome in the setting of acute on chronic CHF above ?Recheck BMP in 2 weeks time on clinic follow-up ? ?Leg swelling ?Worsening leg swelling in the setting of CHF ? ?Acute on chronic  diastolic CHF ?Ejection fraction 50 to 55% in August 2022 ?Now in atrial fibrillation, weight up at least 15 pounds over the past month ?Unclear compliance with his Lasix ?Recommend he moderate his fluid intake

## 2021-04-25 ENCOUNTER — Encounter: Payer: Self-pay | Admitting: Cardiovascular Disease

## 2021-04-25 ENCOUNTER — Ambulatory Visit: Payer: Medicare Other | Admitting: Cardiovascular Disease

## 2021-04-25 ENCOUNTER — Telehealth: Payer: Self-pay | Admitting: Cardiovascular Disease

## 2021-04-25 ENCOUNTER — Other Ambulatory Visit: Payer: Self-pay

## 2021-04-25 VITALS — BP 110/60 | HR 103 | Ht 66.0 in | Wt 205.4 lb

## 2021-04-25 DIAGNOSIS — Z952 Presence of prosthetic heart valve: Secondary | ICD-10-CM

## 2021-04-25 DIAGNOSIS — I4819 Other persistent atrial fibrillation: Secondary | ICD-10-CM | POA: Diagnosis not present

## 2021-04-25 DIAGNOSIS — I2510 Atherosclerotic heart disease of native coronary artery without angina pectoris: Secondary | ICD-10-CM

## 2021-04-25 DIAGNOSIS — I519 Heart disease, unspecified: Secondary | ICD-10-CM

## 2021-04-25 DIAGNOSIS — I10 Essential (primary) hypertension: Secondary | ICD-10-CM

## 2021-04-25 DIAGNOSIS — N183 Chronic kidney disease, stage 3 unspecified: Secondary | ICD-10-CM | POA: Diagnosis not present

## 2021-04-25 DIAGNOSIS — R0602 Shortness of breath: Secondary | ICD-10-CM

## 2021-04-25 MED ORDER — POTASSIUM CHLORIDE CRYS ER 20 MEQ PO TBCR: 20.0000 meq | EXTENDED_RELEASE_TABLET | Freq: Two times a day (BID) | ORAL | 3 refills | Status: DC

## 2021-04-25 MED ORDER — TORSEMIDE 20 MG PO TABS: 40.0000 mg | ORAL_TABLET | Freq: Two times a day (BID) | ORAL | 3 refills | Status: DC

## 2021-04-25 NOTE — Telephone Encounter (Signed)
Son is calling from out of town & wants to discuss some personal concerns with father before his appointment today. Please call asap. ?

## 2021-04-25 NOTE — Telephone Encounter (Signed)
Called to speak with son.  ? ?Son reports that he recently spent 7 days in the hospital, and they pulled off nearly 20 pounds of fluid off of him. He was feeling really good after this. Son reports he very quickly gained the fluid weight back. Patient has been angry and irritated and SOB since gaining the fluid back.  ? ?Son is out of town for work. Son's girlfriend has been checking in on him and she reports that pt stated "I've had the worse night that I've ever had, I couldn't get to sleep, I got up and down, went to the chair and back to bed, my belly is double the size it's supposed to be, I literally thought for the first time about taking my gun and shooting myself." Son's girlfriend told him that that wasn't something to do. Pt stated, "No, I keep going from one doctor to another and I take all the meds like I'm supposed to, and I just don't want to live anymore. Don't tell Timothy Riley because I know he needs to be out of town for work."  ? ?Timothy Riley doesn't feel sure that he would harm himself, but wanted to make sure that we were aware.  ? ?Will alert MD to concerns prior to appt.  ?

## 2021-04-25 NOTE — Patient Instructions (Addendum)
Medication Instructions:  ?Please stop the furosemide/lasix ?Stop bumex ? ?Start torsemide 40 mg twice a day ?Start potassium 20 meq twice a day ? ?When weight is 193 pounds, decrease torsemide down to 40 daily with potassium meq once a day ? ?Goal weight 190 ? ?If you need a refill on your cardiac medications before your next appointment, please call your pharmacy.  ? ?Lab work: ?No new labs needed ? ?Testing/Procedures: ?No new testing needed ? ?Follow-Up: ?At [facility], you and your health needs are our priority.  As part of our continuing mission to provide you with exceptional heart care, we have created designated Provider Care Teams.  These Care Teams include your primary Cardiologist (physician) and Advanced Practice Providers (APPs -  Physician Assistants and Nurse Practitioners) who all work together to provide you with the care you need, when you need it. ? ?You will need a follow up appointment in 2 weeks ? ?Providers on your designated Care Team:   ?Murray Hodgkins, NP ?Christell Faith, PA-C ?Cadence Kathlen Mody, PA-C ? ?COVID-19 Vaccine Information can be found at: [email or web address].co.uk For questions related to vaccine distribution or appointments, please email [email or web address] or call [phone])668-9136.  ? ?

## 2021-04-28 NOTE — Telephone Encounter (Signed)
Pt stated that Dr.Gollan wanted him to call and let him know how he has been doing since starting Torsemide.Pt states that he's doing well and has loss 10lbs since being released from the hospital. ?

## 2021-05-01 ENCOUNTER — Telehealth: Payer: Self-pay | Admitting: Cardiovascular Disease

## 2021-05-01 ENCOUNTER — Ambulatory Visit: Payer: Medicare Other | Admitting: Cardiovascular Disease

## 2021-05-01 NOTE — Telephone Encounter (Signed)
Attempted to call pt to speak with him about his weight.  ? ?No answer, unable to leave message.  ?

## 2021-05-01 NOTE — Telephone Encounter (Signed)
Spoke with patient.  ? ?Patient reports that he has gained 16 pounds since being released. Clarified with pt that he is meaning from when he was released from the hospital in 03/2021. Pt said yes. Pt stating that he needs to see MD today. ? ?Pt stated that he was given a new med when he saw MD last week. Went over torsedmide and potassium with him. Pt has been taking torsermide '20mg'$  BID, rather than '40mg'$  BID. Went over med instructions with pt and his son who was on speaker phone.  ? ?Pt voiced understanding. Will start to take torsermide '40mg'$  BID with potassium 20 mEg BID.  ? ?Pt states he will call back to let us know how he is doing.  ? ?Pt voiced appreciated for the call.  ?

## 2021-05-03 ENCOUNTER — Other Ambulatory Visit: Payer: Self-pay | Admitting: Cardiovascular Disease

## 2021-05-08 NOTE — Progress Notes (Signed)
Cardiology Office Note ? ?Date:  05/09/2021  ? ?ID:  Timothy Riley, DOB [birth year], MRN [id number] ? ?PCP:  Derinda Late, MD  ? ?Chief Complaint  ?Patient presents with  ? 2 week follow up   ?  Patient c/o LE edema. Medications reviewed by the patient verbally.   ? ? ?HPI:  ?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 ?LOV with myself in 3/23 ? ?In hosptal  4/09,  diastolic CHF ?May not have been taking his Lasix as outpatient ?Treated with IV Lasix, was in normal sinus rhythm ?Discharged on Lasix 40 daily ?Weight at home 190  after he got home ?When seen in the clinic, weight was up 15 pounds ?He was back in atrial fibrillation likely contributing to weight gain ? ?Reports only taking torsemide 20 mg twice a day, only in the past several days has been taking torsemide 40 BID, on potassium 2 a day ?Still with edema, difficult time walking around secondary to legs feel heavy ?Abdomen is also distended, not much improvement since last clinic visit ?Weight has not particularly changed on the higher dose torsemide ? ?Lab work reviewed creatinine up to 2.5 BUN 43 1 month ago ? ?EKG personally reviewed by myself on todays visit ?Atrial fibrillation rate 111 bpm right bundle branch block ? ?Other past medical history reviewed ?Echo 03/2018,  ? 1. The left ventricle has normal systolic function with an ejection fraction of 60-65%. The cavity size was normal. There is mildly increased left ventricular wall thickness. Left ventricular diastolic Doppler parameters are consistent with impaired  ?relaxation. ? 2. The right ventricle has  normal systolic function. The cavity was normal. There is no increase in right ventricular wall thickness. ? 3. Left atrial size was mildly dilated. ? 4. 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 Procedure Date: 03/01/14 Normal aortic valve prosthesis. AV Mean Grad: 16.0 mmHg ? 5. Right atrial pressure is estimated at 10 mmHg ? ?Went to the emergency room November 05, 2017 ?atrial fibrillation,  ?Started on anticoagulation at that time, eliquis 5 BID ? ? hypotensive on a previous clinic visit, stopped his tamsulosin, chlorthalidone ?  ?Previous notes from outside cardiologist says he has mild coronary artery disease, details not available through care everywhere ?Followed by endocrine for his diabetes ?  ?Prior episodes of SVT requiring adenosine ? ?  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 ? ?PSH:    ?Past Surgical History:  ?Procedure Laterality Date  ? AORTIC VALVE REPLACEMENT    ? CARDIAC CATHETERIZATION    ? CARDIOVERSION N/A 01/01/2018  ? Procedure: CARDIOVERSION;  Surgeon: Minna Merritts, MD;  Location: [facility] ORS;  Service: Cardiovascular;  Laterality: N/A;  ? COLONOSCOPY  2010  ? heart valve replacment  2015  ? XI ROBOTIC ASSISTED INGUINAL HERNIA REPAIR WITH MESH Right 01/07/2019  ? Procedure: XI ROBOTIC ASSISTED INGUINAL HERNIA REPAIR WITH MESH;  Surgeon: Ronny Bacon, MD;  Location: [facility] ORS;  Service: General;  Laterality:  Right;  ? ? ?Current Outpatient Medications  ?Medication Sig Dispense Refill  ? carvedilol (COREG) 12.5 MG tablet Take 1 tablet (12.5 mg total) by mouth 2 (two) times daily with a meal. 60 tablet 0  ? Cholecalciferol (VITAMIN D3 PO) Take by mouth daily.    ? doxazosin (CARDURA) 8 MG tablet TAKE 1 TABLET BY MOUTH TWICE A DAY 60 tablet 11  ? ELIQUIS 2.5 MG TABS tablet TAKE 1 TABLET BY MOUTH TWICE A DAY 180 tablet 1  ? glipiZIDE (GLUCOTROL) 10 MG tablet TAKE 1 TABLET BY MOUTH TWICE A DAY BEFORE A MEAL. (Patient taking differently: Take 10 mg by mouth daily before breakfast.) 180 tablet 1  ? pioglitazone (ACTOS) 30 MG tablet TAKE 1 TABLET BY MOUTH ONCE DAILY 90 tablet 1  ? potassium chloride SA (KLOR-CON M) 20 MEQ tablet Take 1 tablet (20 mEq total) by mouth 2 (two) times daily. 90 tablet 3  ? rosuvastatin (CRESTOR) 10 MG tablet Take 1 tablet (10 mg total) by mouth daily. 90 tablet 0  ? torsemide (DEMADEX) 20 MG tablet Take 2 tablets (40 mg total) by mouth 2 (two) times daily. 180 tablet 3  ? amoxicillin (AMOXIL) 500 MG capsule TAKE 4 CAPSULES 1 HOUR BEFORE DENTAL APPOINTMENT (Patient not taking: Reported on 04/25/2021) 4 capsule 2  ? ?No current facility-administered medications for this visit.  ? ? ?Allergies:   Gabapentin, Hydralazine, and Hydralazine hcl  ? ?Social History:  The patient  reports that he quit smoking about 49 years ago. His smoking use included cigarettes. He has a 0.50 pack-year smoking history. He has never used smokeless tobacco. He reports that he does not drink alcohol and does not use drugs.  ? ?Family History:   family history includes Cancer in his mother; Stroke in his father.  ? ?Review of Systems: ?Review of Systems  ?Constitutional: Negative.   ?HENT: Negative.    ?Respiratory: Negative.    ?Cardiovascular:  Positive for leg swelling.  ?Gastrointestinal: Negative.   ?Musculoskeletal: Negative.   ?Neurological: Negative.   ?Psychiatric/Behavioral: Negative.    ?All other systems reviewed  and are negative. ? ?PHYSICAL EXAM: ?VS:  BP 118/70 (BP Location: Left Arm, Patient Position: Sitting, Cuff Size: Normal)   Pulse (!) 111   Ht '5\' 8"'$  (1.727 m)   Wt 92.3 kg   SpO2 96%   BMI 30.92 kg/m?  , BMI Body mass index is 30.92 kg/m?Marland Kitchen ?Constitutional:  oriented to person, place, and time. No distress.  ?HENT:  ?Head: Grossly normal ?Eyes:  no discharge. No scleral icterus.  ?Neck: No JVD, no carotid bruits  ?Cardiovascular: irreg irreg,  no murmurs appreciated ?2+ pitting edema legs ?Pulmonary/Chest: Clear to auscultation bilaterally, no wheezes  ?Scattered rales at the bases ?Abdominal: Soft.  Mild abdominal distension.  no tenderness.  ?Musculoskeletal: Normal range of motion ?Neurological:  normal muscle tone. Coordination normal. No atrophy ?Skin: Skin warm and dry ?Psychiatric: normal affect, pleasant ? ?Recent Labs: ?02/22/2021: BNP 938.3; TSH 6.400 ?03/09/2021: ALT 8 ?03/10/2021: Hemoglobin 9.3; Platelets 140 ?03/15/2021: BUN 29; Creatinine, Ser 2.04; Magnesium 2.0; Potassium 4.1; Sodium 137  ? ? ?Lipid Panel ?Lab Results  ?Component Value Date  ? CHOL 135 12/23/2018  ? HDL 28 (L) 12/23/2018  ?  [place] 79 12/23/2018  ? TRIG 185 (H) 12/23/2018  ? ? ?Wt Readings from Last 3 Encounters:  ?05/09/21 92.3 kg  ?04/25/21 93.2 kg  ?03/15/21 85.3 kg  ?  ? ?ASSESSMENT AND PLAN: ? ?Atrial fibrillation, persistent ?Prior cardioversion  ?Worsening CHF symptoms likely exacerbated by underlying atrial fibrillation ?-Reports he has been compliant with hislEliquis 5 twice daily, ?We have recommended he increase the Coreg up to 25 twice daily given poorly controlled rate ?Amiodarone may have been held in the past June 2022 for underlying thyroid disorder. ?Attempts at aggressive diuresis prior to cardioversion unsuccessful, now with worsening renal dysfunction.  ?Recommend we proceed with cardioversion.  Risk and benefit discussed, this will be scheduled with his permission ? ?Acute on chronic renal failure ?Previously  followed by nephrology, creatinine greater than 2 ?Requiring higher doses of torsemide for CHF symptoms in the setting of atrial fibrillation ?We will proceed with cardioversion as above ? ?Leg swelling ?Worsening le

## 2021-05-09 ENCOUNTER — Ambulatory Visit (INDEPENDENT_AMBULATORY_CARE_PROVIDER_SITE_OTHER): Payer: Medicare Other | Admitting: Cardiovascular Disease

## 2021-05-09 ENCOUNTER — Encounter: Payer: Self-pay | Admitting: Cardiovascular Disease

## 2021-05-09 VITALS — BP 118/70 | HR 111 | Ht 68.0 in | Wt 203.4 lb

## 2021-05-09 DIAGNOSIS — E782 Mixed hyperlipidemia: Secondary | ICD-10-CM

## 2021-05-09 DIAGNOSIS — Z953 Presence of xenogenic heart valve: Secondary | ICD-10-CM

## 2021-05-09 DIAGNOSIS — Z952 Presence of prosthetic heart valve: Secondary | ICD-10-CM | POA: Diagnosis not present

## 2021-05-09 DIAGNOSIS — I4819 Other persistent atrial fibrillation: Secondary | ICD-10-CM

## 2021-05-09 DIAGNOSIS — I10 Essential (primary) hypertension: Secondary | ICD-10-CM

## 2021-05-09 DIAGNOSIS — R0602 Shortness of breath: Secondary | ICD-10-CM

## 2021-05-09 DIAGNOSIS — I2510 Atherosclerotic heart disease of native coronary artery without angina pectoris: Secondary | ICD-10-CM

## 2021-05-09 DIAGNOSIS — N183 Chronic kidney disease, stage 3 unspecified: Secondary | ICD-10-CM

## 2021-05-09 DIAGNOSIS — G4730 Sleep apnea, unspecified: Secondary | ICD-10-CM

## 2021-05-09 DIAGNOSIS — N1832 Chronic kidney disease, stage 3b: Secondary | ICD-10-CM

## 2021-05-09 DIAGNOSIS — R079 Chest pain, unspecified: Secondary | ICD-10-CM

## 2021-05-09 NOTE — Patient Instructions (Addendum)
Medication Instructions:  ?Please increase the coreg up to 25 mg twice a day ? ?If you need a refill on your cardiac medications before your next appointment, please call your pharmacy.  ? ?Lab work: ? ?Today: BMP, CBC ? ?Testing/Procedures: ?You are scheduled for a Cardioversion on April 21st, 2023 with Dr. Rockey Situ ?Please arrive at the [place] [facility] at 6:30 a.m. on the day of your procedure. ? ?DIET INSTRUCTIONS:  ?Nothing to eat or drink after midnight except your medications with a sip of water. ? ?Labs: CBC, BMP drawn 4/11 ? ?Medications:  YOU MAY TAKE ALL of your remaining medications with a small amount of water. ? ?Must have a responsible person to drive you home. ? ?Bring a current list of your medications and current insurance cards.  ? ? ?If you have any questions after you get home, please call the office at [phone]  ? ?Follow-Up: ?At [facility], you and your health needs are our priority.  As part of our continuing mission to provide you with exceptional heart care, we have created designated Provider Care Teams.  These Care Teams include your primary Cardiologist (physician) and Advanced Practice Providers (APPs -  Physician Assistants and Nurse Practitioners) who all work together to provide you with the care you need, when you need it. ? ?You will need a follow up appointment in 1 month ? ?Providers on your designated Care Team:   ?Murray Hodgkins, NP ?Christell Faith, PA-C ?Cadence Kathlen Mody, PA-C ? ?COVID-19 Vaccine Information can be found at: [email or web address].co.uk For questions related to vaccine distribution or appointments, please email [email or web address] or call [phone].  ? ?

## 2021-05-10 LAB — CBC
Hematocrit: 32.8 % — ABNORMAL LOW (ref 37.5–51.0)
Hemoglobin: 10.5 g/dL — ABNORMAL LOW (ref 13.0–17.7)
MCH: 28.1 pg (ref 26.6–33.0)
MCHC: 32 g/dL (ref 31.5–35.7)
MCV: 88 fL (ref 79–97)
Platelets: 157 10*3/uL (ref 150–450)
RBC: 3.74 x10E6/uL — ABNORMAL LOW (ref 4.14–5.80)
RDW: 15.1 % (ref 11.6–15.4)
WBC: 5.8 10*3/uL (ref 3.4–10.8)

## 2021-05-10 LAB — BASIC METABOLIC PANEL
BUN/Creatinine Ratio: 15 (ref 10–24)
BUN: 37 mg/dL — ABNORMAL HIGH (ref 8–27)
CO2: 30 mmol/L — ABNORMAL HIGH (ref 20–29)
Calcium: 9 mg/dL (ref 8.6–10.2)
Chloride: 102 mmol/L (ref 96–106)
Creatinine, Ser: 2.4 mg/dL — ABNORMAL HIGH (ref 0.76–1.27)
Glucose: 201 mg/dL — ABNORMAL HIGH (ref 70–99)
Potassium: 3.8 mmol/L (ref 3.5–5.2)
Sodium: 148 mmol/L — ABNORMAL HIGH (ref 134–144)
eGFR: 26 mL/min/{1.73_m2} — ABNORMAL LOW (ref >59)

## 2021-05-11 ENCOUNTER — Telehealth: Payer: Self-pay | Admitting: Cardiovascular Disease

## 2021-05-11 NOTE — Telephone Encounter (Signed)
?  Pt is returning call, he [place] if he cant answer the phone call, call his son's number instead [phone]. Pt also made an appt on Monday 04/17 with Dr. Rockey Situ  ?

## 2021-05-11 NOTE — Telephone Encounter (Signed)
Pt c/o swelling: STAT is pt has developed SOB within 24 hours ? ?How much weight have you gained and in what time span?  ?Unsure  ? ?If swelling, where is the swelling located?  ?Legs/feet  ? ?Are you currently taking a fluid pill?  ?Yes  ? ?Are you currently SOB?  ?No  ? ?Do you have a log of your daily weights (if so, list)?  ?4/13: 201 lbs ? ?Have you gained 3 pounds in a day or 5 pounds in a week?  ? ?Have you traveled recently?  ? ?No, patient states he has been having trouble walking due to excessive swelling. He mentioned he has had 2 falls since 4/11 appointment with Dr. Rockey Situ. He would also like to know if his cardioversion can be moved up.  ? ?

## 2021-05-11 NOTE — Telephone Encounter (Signed)
Returned the patients call. Unable to lmom. Pt voicemail is not set up. ?

## 2021-05-11 NOTE — Telephone Encounter (Signed)
Attempted to contact the patient. ?No answer- no voice mail set up.  ?

## 2021-05-13 ENCOUNTER — Inpatient Hospital Stay
Admission: EM | Admit: 2021-05-13 | Discharge: 2021-05-29 | DRG: 291 | Disposition: A | Discharge status: Home Health | Payer: Medicare Other | Attending: Hospitalist | Admitting: Hospitalist

## 2021-05-13 ENCOUNTER — Emergency Department: Payer: Medicare Other

## 2021-05-13 ENCOUNTER — Other Ambulatory Visit: Payer: Self-pay

## 2021-05-13 ENCOUNTER — Telehealth: Payer: Self-pay | Admitting: Internal Medicine

## 2021-05-13 DIAGNOSIS — N2581 Secondary hyperparathyroidism of renal origin: Secondary | ICD-10-CM | POA: Diagnosis not present

## 2021-05-13 DIAGNOSIS — E876 Hypokalemia: Secondary | ICD-10-CM | POA: Diagnosis not present

## 2021-05-13 DIAGNOSIS — Z87891 Personal history of nicotine dependence: Secondary | ICD-10-CM

## 2021-05-13 DIAGNOSIS — E8770 Fluid overload, unspecified: Principal | ICD-10-CM

## 2021-05-13 DIAGNOSIS — I959 Hypotension, unspecified: Secondary | ICD-10-CM | POA: Diagnosis not present

## 2021-05-13 DIAGNOSIS — N186 End stage renal disease: Secondary | ICD-10-CM | POA: Diagnosis present

## 2021-05-13 DIAGNOSIS — R54 Age-related physical debility: Secondary | ICD-10-CM | POA: Diagnosis present

## 2021-05-13 DIAGNOSIS — N170 Acute kidney failure with tubular necrosis: Secondary | ICD-10-CM | POA: Diagnosis not present

## 2021-05-13 DIAGNOSIS — R413 Other amnesia: Secondary | ICD-10-CM

## 2021-05-13 DIAGNOSIS — N400 Enlarged prostate without lower urinary tract symptoms: Secondary | ICD-10-CM | POA: Diagnosis present

## 2021-05-13 DIAGNOSIS — I4510 Unspecified right bundle-branch block: Secondary | ICD-10-CM | POA: Diagnosis present

## 2021-05-13 DIAGNOSIS — I5033 Acute on chronic diastolic (congestive) heart failure: Secondary | ICD-10-CM | POA: Diagnosis not present

## 2021-05-13 DIAGNOSIS — I5021 Acute systolic (congestive) heart failure: Secondary | ICD-10-CM | POA: Diagnosis present

## 2021-05-13 DIAGNOSIS — D631 Anemia in chronic kidney disease: Secondary | ICD-10-CM | POA: Diagnosis present

## 2021-05-13 DIAGNOSIS — D649 Anemia, unspecified: Secondary | ICD-10-CM | POA: Diagnosis not present

## 2021-05-13 DIAGNOSIS — D696 Thrombocytopenia, unspecified: Secondary | ICD-10-CM | POA: Diagnosis not present

## 2021-05-13 DIAGNOSIS — I5043 Acute on chronic combined systolic (congestive) and diastolic (congestive) heart failure: Secondary | ICD-10-CM | POA: Diagnosis present

## 2021-05-13 DIAGNOSIS — I132 Hypertensive heart and chronic kidney disease with heart failure and with stage 5 chronic kidney disease, or end stage renal disease: Secondary | ICD-10-CM | POA: Diagnosis present

## 2021-05-13 DIAGNOSIS — E119 Type 2 diabetes mellitus without complications: Secondary | ICD-10-CM

## 2021-05-13 DIAGNOSIS — Z992 Dependence on renal dialysis: Secondary | ICD-10-CM | POA: Diagnosis not present

## 2021-05-13 DIAGNOSIS — I2510 Atherosclerotic heart disease of native coronary artery without angina pectoris: Secondary | ICD-10-CM | POA: Diagnosis present

## 2021-05-13 DIAGNOSIS — E11649 Type 2 diabetes mellitus with hypoglycemia without coma: Secondary | ICD-10-CM | POA: Diagnosis not present

## 2021-05-13 DIAGNOSIS — E875 Hyperkalemia: Secondary | ICD-10-CM | POA: Diagnosis not present

## 2021-05-13 DIAGNOSIS — Z888 Allergy status to other drugs, medicaments and biological substances status: Secondary | ICD-10-CM

## 2021-05-13 DIAGNOSIS — I420 Dilated cardiomyopathy: Secondary | ICD-10-CM | POA: Diagnosis present

## 2021-05-13 DIAGNOSIS — I480 Paroxysmal atrial fibrillation: Secondary | ICD-10-CM | POA: Diagnosis not present

## 2021-05-13 DIAGNOSIS — E079 Disorder of thyroid, unspecified: Secondary | ICD-10-CM | POA: Diagnosis present

## 2021-05-13 DIAGNOSIS — I5041 Acute combined systolic (congestive) and diastolic (congestive) heart failure: Secondary | ICD-10-CM | POA: Diagnosis not present

## 2021-05-13 DIAGNOSIS — I4819 Other persistent atrial fibrillation: Secondary | ICD-10-CM | POA: Diagnosis not present

## 2021-05-13 DIAGNOSIS — Z7901 Long term (current) use of anticoagulants: Secondary | ICD-10-CM

## 2021-05-13 DIAGNOSIS — J9811 Atelectasis: Secondary | ICD-10-CM | POA: Diagnosis present

## 2021-05-13 DIAGNOSIS — I509 Heart failure, unspecified: Secondary | ICD-10-CM | POA: Insufficient documentation

## 2021-05-13 DIAGNOSIS — R451 Restlessness and agitation: Secondary | ICD-10-CM | POA: Diagnosis not present

## 2021-05-13 DIAGNOSIS — Z7984 Long term (current) use of oral hypoglycemic drugs: Secondary | ICD-10-CM

## 2021-05-13 DIAGNOSIS — K5900 Constipation, unspecified: Secondary | ICD-10-CM | POA: Diagnosis not present

## 2021-05-13 DIAGNOSIS — E1122 Type 2 diabetes mellitus with diabetic chronic kidney disease: Secondary | ICD-10-CM | POA: Diagnosis present

## 2021-05-13 DIAGNOSIS — E785 Hyperlipidemia, unspecified: Secondary | ICD-10-CM | POA: Diagnosis present

## 2021-05-13 DIAGNOSIS — I5031 Acute diastolic (congestive) heart failure: Secondary | ICD-10-CM | POA: Diagnosis not present

## 2021-05-13 DIAGNOSIS — I428 Other cardiomyopathies: Secondary | ICD-10-CM | POA: Diagnosis not present

## 2021-05-13 DIAGNOSIS — N179 Acute kidney failure, unspecified: Secondary | ICD-10-CM

## 2021-05-13 DIAGNOSIS — Z953 Presence of xenogenic heart valve: Secondary | ICD-10-CM | POA: Diagnosis not present

## 2021-05-13 DIAGNOSIS — D509 Iron deficiency anemia, unspecified: Secondary | ICD-10-CM

## 2021-05-13 DIAGNOSIS — N1832 Chronic kidney disease, stage 3b: Secondary | ICD-10-CM

## 2021-05-13 DIAGNOSIS — I120 Hypertensive chronic kidney disease with stage 5 chronic kidney disease or end stage renal disease: Secondary | ICD-10-CM | POA: Diagnosis not present

## 2021-05-13 DIAGNOSIS — Z8679 Personal history of other diseases of the circulatory system: Secondary | ICD-10-CM

## 2021-05-13 DIAGNOSIS — I4891 Unspecified atrial fibrillation: Secondary | ICD-10-CM | POA: Diagnosis not present

## 2021-05-13 DIAGNOSIS — E162 Hypoglycemia, unspecified: Secondary | ICD-10-CM | POA: Diagnosis not present

## 2021-05-13 DIAGNOSIS — Z91148 Patient's other noncompliance with medication regimen for other reason: Secondary | ICD-10-CM

## 2021-05-13 DIAGNOSIS — Z952 Presence of prosthetic heart valve: Secondary | ICD-10-CM | POA: Diagnosis not present

## 2021-05-13 DIAGNOSIS — Z823 Family history of stroke: Secondary | ICD-10-CM

## 2021-05-13 DIAGNOSIS — Z79899 Other long term (current) drug therapy: Secondary | ICD-10-CM

## 2021-05-13 DIAGNOSIS — E861 Hypovolemia: Secondary | ICD-10-CM | POA: Diagnosis not present

## 2021-05-13 DIAGNOSIS — Z6830 Body mass index (BMI) 30.0-30.9, adult: Secondary | ICD-10-CM

## 2021-05-13 DIAGNOSIS — T502X5A Adverse effect of carbonic-anhydrase inhibitors, benzothiadiazides and other diuretics, initial encounter: Secondary | ICD-10-CM | POA: Diagnosis not present

## 2021-05-13 DIAGNOSIS — G4709 Other insomnia: Secondary | ICD-10-CM | POA: Diagnosis not present

## 2021-05-13 DIAGNOSIS — I25118 Atherosclerotic heart disease of native coronary artery with other forms of angina pectoris: Secondary | ICD-10-CM | POA: Diagnosis not present

## 2021-05-13 DIAGNOSIS — Z5320 Procedure and treatment not carried out because of patient's decision for unspecified reasons: Secondary | ICD-10-CM | POA: Diagnosis not present

## 2021-05-13 DIAGNOSIS — Z85828 Personal history of other malignant neoplasm of skin: Secondary | ICD-10-CM

## 2021-05-13 LAB — BRAIN NATRIURETIC PEPTIDE: B Natriuretic Peptide: 1597.6 pg/mL — ABNORMAL HIGH (ref 0.0–100.0)

## 2021-05-13 LAB — CBC
HCT: 34.3 % — ABNORMAL LOW (ref 39.0–52.0)
Hemoglobin: 10.7 g/dL — ABNORMAL LOW (ref 13.0–17.0)
MCH: 27.9 pg (ref 26.0–34.0)
MCHC: 31.2 g/dL (ref 30.0–36.0)
MCV: 89.3 fL (ref 80.0–100.0)
Platelets: 161 10*3/uL (ref 150–400)
RBC: 3.84 MIL/uL — ABNORMAL LOW (ref 4.22–5.81)
RDW: 16 % — ABNORMAL HIGH (ref 11.5–15.5)
WBC: 5.5 10*3/uL (ref 4.0–10.5)
nRBC: 0 % (ref 0.0–0.2)

## 2021-05-13 LAB — BASIC METABOLIC PANEL
Anion gap: 11 (ref 5–15)
BUN: 55 mg/dL — ABNORMAL HIGH (ref 8–23)
CO2: 30 mmol/L (ref 22–32)
Calcium: 8.6 mg/dL — ABNORMAL LOW (ref 8.9–10.3)
Chloride: 101 mmol/L (ref 98–111)
Creatinine, Ser: 2.65 mg/dL — ABNORMAL HIGH (ref 0.61–1.24)
GFR, Estimated: 23 mL/min — ABNORMAL LOW (ref >60)
Glucose, Bld: 76 mg/dL (ref 70–99)
Potassium: 4 mmol/L (ref 3.5–5.1)
Sodium: 142 mmol/L (ref 135–145)

## 2021-05-13 LAB — TROPONIN I (HIGH SENSITIVITY): Troponin I (High Sensitivity): 20 ng/L — ABNORMAL HIGH (ref <18)

## 2021-05-13 MED ORDER — ONDANSETRON HCL 4 MG/2ML IJ SOLN
4.0000 mg | Freq: Four times a day (QID) | INTRAMUSCULAR | Status: DC | PRN
  Administered 2021-05-17: 4 mg via INTRAVENOUS
  Filled 2021-05-13: qty 2

## 2021-05-13 MED ORDER — ONDANSETRON HCL 4 MG PO TABS: 4.0000 mg | ORAL_TABLET | Freq: Four times a day (QID) | ORAL | Status: DC | PRN

## 2021-05-13 MED ORDER — FUROSEMIDE 10 MG/ML IJ SOLN
40.0000 mg | Freq: Two times a day (BID) | INTRAMUSCULAR | Status: DC
  Administered 2021-05-14 – 2021-05-15 (×4): 40 mg via INTRAVENOUS
  Filled 2021-05-13 (×4): qty 4

## 2021-05-13 MED ORDER — CARVEDILOL 6.25 MG PO TABS
12.5000 mg | ORAL_TABLET | Freq: Two times a day (BID) | ORAL | Status: DC
Start: 2021-05-14 — End: 2021-05-19
  Administered 2021-05-14 – 2021-05-19 (×9): 12.5 mg via ORAL
  Filled 2021-05-13 (×10): qty 2

## 2021-05-13 MED ORDER — ACETAMINOPHEN 650 MG RE SUPP: 650.0000 mg | Freq: Four times a day (QID) | RECTAL | Status: DC | PRN

## 2021-05-13 MED ORDER — FUROSEMIDE 10 MG/ML IJ SOLN
60.0000 mg | Freq: Once | INTRAMUSCULAR | Status: AC
  Administered 2021-05-13: 60 mg via INTRAVENOUS
  Filled 2021-05-13: qty 8

## 2021-05-13 MED ORDER — ROSUVASTATIN CALCIUM 10 MG PO TABS
10.0000 mg | ORAL_TABLET | Freq: Every day | ORAL | Status: DC
  Administered 2021-05-14 – 2021-05-29 (×16): 10 mg via ORAL
  Filled 2021-05-13 (×16): qty 1

## 2021-05-13 MED ORDER — POTASSIUM CHLORIDE CRYS ER 20 MEQ PO TBCR
20.0000 meq | EXTENDED_RELEASE_TABLET | Freq: Two times a day (BID) | ORAL | Status: DC
  Administered 2021-05-14 – 2021-05-18 (×10): 20 meq via ORAL
  Filled 2021-05-13 (×10): qty 1

## 2021-05-13 MED ORDER — ACETAMINOPHEN 325 MG PO TABS
650.0000 mg | ORAL_TABLET | Freq: Four times a day (QID) | ORAL | Status: DC | PRN
  Administered 2021-05-16 – 2021-05-20 (×3): 650 mg via ORAL
  Filled 2021-05-13 (×3): qty 2

## 2021-05-13 MED ORDER — APIXABAN 2.5 MG PO TABS
2.5000 mg | ORAL_TABLET | Freq: Two times a day (BID) | ORAL | Status: DC
  Administered 2021-05-14 – 2021-05-25 (×24): 2.5 mg via ORAL
  Filled 2021-05-13 (×25): qty 1

## 2021-05-13 MED ORDER — INSULIN ASPART 100 UNIT/ML IJ SOLN
0.0000 [IU] | INTRAMUSCULAR | Status: DC
  Administered 2021-05-14: 3 [IU] via SUBCUTANEOUS
  Administered 2021-05-14: 2 [IU] via SUBCUTANEOUS
  Administered 2021-05-14 – 2021-05-15 (×3): 3 [IU] via SUBCUTANEOUS
  Administered 2021-05-15: 2 [IU] via SUBCUTANEOUS
  Filled 2021-05-13 (×6): qty 1

## 2021-05-13 NOTE — Assessment & Plan Note (Signed)
Appears stable.  Patient without complaints of chest pain and first troponin 20 ?Continue rosuvastatin and carvedilol ?

## 2021-05-13 NOTE — Assessment & Plan Note (Addendum)
BG within goal ?--d/c'ed BG checks and SSI ?

## 2021-05-13 NOTE — Assessment & Plan Note (Addendum)
--  current Echo showed worsening LVEF to 30% ?--received IV Lasix with worsening renal function, now started on dialysis ?--cardio consulted, now signed off ?Plan: ?Continue carvedilol '25mg'$  ?--dialysis for fluid removal ?--outpatient cardio f/u 1-2 weeks after discharge ?

## 2021-05-13 NOTE — Assessment & Plan Note (Addendum)
--  cont carvedilol ?--complete 10 gm amiodarone load and then transition to 200 mg daily. ?--cont Eliquis ?--outpatient cardio f/u 1-2 weeks after discharge ? ? ?

## 2021-05-13 NOTE — Assessment & Plan Note (Signed)
Continuous cardiac monitoring with no acute issues suspected ?

## 2021-05-13 NOTE — Assessment & Plan Note (Addendum)
--  cont Eliquis ?

## 2021-05-13 NOTE — ED Provider Notes (Signed)
? ?[facility] ?Provider Note ? ? ? Event Date/Time  ? First MD Initiated Contact with Patient 05/13/21 2120   ?  (approximate) ? ? ?History  ? ?Leg Swelling ? ? ?HPI ? ?Timothy Riley is a 81 y.o. male  who, per cardiology note dated 05/09/2021 has history of persistent atrial fibrillation, CHF, who presents to the emergency department today because of concern for leg and abdominal swelling.  The patient states that she has history of heart failure.  He has had issues with swelling in the past.  He states he had a hospitalization for fluid overload.  He says recently he feels like the fluid has gotten increasingly worse.  He is having associated shortness of breath and states that his stomach is so swollen it makes it hard for him to move.  He has been taking his diuretics at home. Denies any fevers.  ? ? ?Physical Exam  ? ?Triage Vital Signs: ?ED Triage Vitals  ?Enc Vitals Group  ?   BP 05/13/21 2031 104/73  ?   Pulse Rate 05/13/21 2031 98  ?   Resp 05/13/21 2031 18  ?   Temp 05/13/21 2040 98.3 ?F (36.8 ?C)  ?   Temp Source 05/13/21 2040 Oral  ?   SpO2 05/13/21 2031 98 %  ?   Weight 05/13/21 2032 203 lb (92.1 kg)  ?   Height --   ?   Head Circumference --   ?   Peak Flow --   ?   Pain Score 05/13/21 2032 0  ? ?Most recent vital signs: ?Vitals:  ? 05/13/21 2031 05/13/21 2040  ?BP: 104/73   ?Pulse: 98   ?Resp: 18   ?Temp:  98.3 ?F (36.8 ?C)  ?SpO2: 98%   ? ? ?General: Awake, alert and oriented ?CV:  Good peripheral perfusion.  ?Resp:  Normal effort.  ?Abd:  Distention ?MSK:  Bilateral lower extremity edema.  ? ? ?ED Results / Procedures / Treatments  ? ?Labs ?(all labs ordered are listed, but only abnormal results are displayed) ?Labs Reviewed  ?BASIC METABOLIC PANEL - Abnormal; Notable for the following components:  ?    Result Value  ? BUN 55 (*)   ? Creatinine, Ser 2.65 (*)   ? Calcium 8.6 (*)   ? GFR, Estimated 23 (*)   ? All other components within normal limits  ?CBC - Abnormal; Notable  for the following components:  ? RBC 3.84 (*)   ? Hemoglobin 10.7 (*)   ? HCT 34.3 (*)   ? RDW 16.0 (*)   ? All other components within normal limits  ?BRAIN NATRIURETIC PEPTIDE - Abnormal; Notable for the following components:  ? B Natriuretic Peptide 1,597.6 (*)   ? All other components within normal limits  ?TROPONIN I (HIGH SENSITIVITY) - Abnormal; Notable for the following components:  ? Troponin I (High Sensitivity) 20 (*)   ? All other components within normal limits  ?TROPONIN I (HIGH SENSITIVITY)  ? ? ? ?EKG ? ?INance Pear, attending physician, personally viewed and interpreted this EKG ? ?EKG Time: 2028 ?Rate: 96 ?Rhythm: atrial fibrillation ?Axis: normal ?Intervals: qtc 535 ?QRS: RBBB ?ST changes: no st elevation ?Impression: abnormal ekg ? ?RADIOLOGY ?I independently interpreted and visualized the CXR. My interpretation: cardiomegaly. No pulmonary edema ?Radiology interpretation:  ?IMPRESSION:  ?1. Chronic appearing increased lung markings with mild right basilar  ?atelectasis.  ?2. Stable cardiomegaly with evidence of prior median sternotomy.  ?   ? ? ? ?  PROCEDURES: ? ?Critical Care performed: No ? ?Procedures ? ? ?MEDICATIONS ORDERED IN ED: ?Medications - No data to display ? ? ?IMPRESSION / MDM / ASSESSMENT AND PLAN / ED COURSE  ?I reviewed the triage vital signs and the nursing notes. ?             ?               ? ?Differential diagnosis includes, but is not limited to, CHF exacerbation, ascites. ? ?Patient presents to the emergency department today because of concerns for fluid overload.  Patient does have history of fluid overload in the past requiring IV Lasix.  He states he has been taking oral diuretics recently without any significant relief.  At this time given that he is having shortness of breath I do have concerns for significant fluid overload.  BNP is elevated.  Chest x-ray without any clear evidence of pulmonary edema.  Will give patient IV Lasix here in the emergency  department.  Discussed with Dr. Damita Dunnings who will plan on admission. ? ? ?FINAL CLINICAL IMPRESSION(S) / ED DIAGNOSES  ? ?Final diagnoses:  ?Hypervolemia, unspecified hypervolemia type  ? ? ? ?Note:  This document was prepared using Dragon voice recognition software and may include unintentional dictation errors. ? ?  ?Nance Pear, MD ?05/13/21 2322 ? ?

## 2021-05-13 NOTE — H&P (Signed)
?History and Physical  ? ? ?Patient: Timothy Riley [facility] DDU:[id number] DOB: 05-16-1940 ?DOA: 05/13/2021 ?DOS: the patient was seen and examined on 05/13/2021 ?PCP: Derinda Late, MD  ?Patient coming from: Home ? ?Chief Complaint:  ?Chief Complaint  ?Patient presents with  ? Leg Swelling  ? ? ?HPI: Timothy Riley is a 81 y.o. male with medical history significant for Persistent A-fib with prior cardioversion, on Eliquis, s/p bioprosthetic AVR, G2 DD (EF 50 to 55% 08/2020), diabetes, nonobstructive CAD with low risk Myoview in 2020, HTN, CKD 3B,hospitalized from 2/8 to 03/15/2021 with acute gastroenteritis and CHF exacerbation  complicated by AKI related to diuretic therapy who presents to the ED  with a complaint of shortness of breath, abdominal distention and bilateral lower extremity edema that has not been responding to outpatient therapy.  He was last seen by cardiologist, Dr. Rockey Situ on 05/09/2021 and review of the note reveals that the plan was to arrange cardioversion to address A-fib.  His Coreg was also increased.Marland Kitchen  He denies chest pain, cough, fever or chills.  Denies vomiting or diarrhea. ?ED course and data review: Tachypneic to 26, tachycardic to 101 with otherwise normal vitals.  Troponin 20 and BNP 1597.  Creatinine 2.65 up from baseline of 1.68.  Hemoglobin 10.7 which is baseline.  Labs otherwise unremarkable.  EKG, personally viewed and interpreted: A-fib at 96 with RBBB and T wave inversion anterior leads.  Chest x-ray showed chronic appearing increased lung markings with mild right basilar atelectasis. ?Patient treated with IV Lasix 60 mg.  Hospitalist consulted for admission.  ? ?Review of Systems: As mentioned in the history of present illness. All other systems reviewed and are negative. ?Past Medical History:  ?Diagnosis Date  ? 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 Aortic heart valve narrowing 12/31/2013  ? Overview:  Sp #23 pericardial edwards valve repalcement 2016   ? Aortic stenosis   ? a. 01/2014 s/p  AVR with 23 mm Carpentier-Edwards pericardial tissue valve by D. Glower MD @ [facility] via R thoracic approach; b. 03/2018 Echo: EF 60-65%, DD. Mildly dil LA. Nl fxn AoV prosthesis. Grad 56mHg.  ? Basal cell carcinoma 05/15/2017  ? right nose above alar crease  ? CKD (chronic kidney disease) stage 3, GFR 30-59 ml/min (HCC) 06/17/2014  ? Diabetes mellitus without complication ([place]   ? Hypertension   ? 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 Low serum HDL 10/26/2015  ? Non-obstructive CAD (coronary artery disease)   ? a. 12/2013 Cath ([facility]): LAD 30p, RI 60, otw nl ([place]; b. 03/2018 MV: EF 59%, no ischemia/scar.  ? Persistent atrial fibrillation ([place] 10/2017  ? a. CHA2DS2VASc = 5-->Eliquis; b. 12/2017 loaded w/amio-->DCCV.  ? PSVT (paroxysmal supraventricular tachycardia) ([place]   ? a. 05/2014 s/p RFCA @ [facility].  ? Right inguinal hernia   ? Urinary incontinence   ? ?Past Surgical History:  ?Procedure Laterality Date  ? AORTIC VALVE REPLACEMENT    ? CARDIAC CATHETERIZATION    ? CARDIOVERSION N/A 01/01/2018  ? Procedure: CARDIOVERSION;  Surgeon: GMinna Merritts MD;  Location: [facility] ORS;  Service: Cardiovascular;  Laterality: N/A;  ? COLONOSCOPY  2010  ? heart valve replacment  2015  ? XI ROBOTIC ASSISTED INGUINAL HERNIA REPAIR WITH MESH Right 01/07/2019  ? Procedure: XI ROBOTIC ASSISTED INGUINAL HERNIA REPAIR WITH MESH;  Surgeon: RRonny Bacon MD;  Location: [facility] ORS;  Service: General;  Laterality: Right;  ? ?Social History:  reports  that he quit smoking about 49 years ago. His smoking use included cigarettes. He has a 0.50 pack-year smoking history. He has never used smokeless tobacco. He reports that he does not drink alcohol and does not use drugs. ? ?Allergies  ?Allergen Reactions  ? Gabapentin Other (See Comments) and Rash  ?  dizzy ?dizzy  ? Hydralazine Anxiety and Rash  ? Hydralazine Hcl Rash  ? ? ?Family History  ?Problem Relation Age of Onset  ?  Cancer Mother   ? Stroke Father   ? ? ?Prior to Admission medications   ?Medication Sig Start Date End Date Taking? Authorizing Provider  ?amoxicillin (AMOXIL) 500 MG capsule TAKE 4 CAPSULES 1 HOUR BEFORE DENTAL APPOINTMENT ?Patient not taking: Reported on 04/25/2021 04/28/20   Minna Merritts, MD  ?carvedilol (COREG) 12.5 MG tablet Take 1 tablet (12.5 mg total) by mouth 2 (two) times daily with a meal. 03/15/21   Jennye Boroughs, MD  ?Cholecalciferol (VITAMIN D3 PO) Take by mouth daily.    [provider]  ?doxazosin (CARDURA) 8 MG tablet TAKE 1 TABLET BY MOUTH TWICE A DAY 05/03/21   Gollan, Kathlene November, MD  ?ELIQUIS 2.5 MG TABS tablet TAKE 1 TABLET BY MOUTH TWICE A DAY 04/19/21   Minna Merritts, MD  ?glipiZIDE (GLUCOTROL) 10 MG tablet TAKE 1 TABLET BY MOUTH TWICE A DAY BEFORE A MEAL. ?Patient taking differently: Take 10 mg by mouth daily before breakfast. 01/28/20   Towanda Malkin, MD  ?pioglitazone (ACTOS) 30 MG tablet TAKE 1 TABLET BY MOUTH ONCE DAILY 01/20/20   Towanda Malkin, MD  ?potassium chloride SA (KLOR-CON M) 20 MEQ tablet Take 1 tablet (20 mEq total) by mouth 2 (two) times daily. 04/25/21   Minna Merritts, MD  ?rosuvastatin (CRESTOR) 10 MG tablet Take 1 tablet (10 mg total) by mouth daily. 06/29/20   Steele Sizer, MD  ?torsemide (DEMADEX) 20 MG tablet Take 2 tablets (40 mg total) by mouth 2 (two) times daily. 04/25/21 07/24/21  Minna Merritts, MD  ? ? ?Physical Exam: ?Vitals:  ? 05/13/21 2040 05/13/21 2115 05/13/21 2200 05/13/21 2230  ?BP:  102/80 113/86 128/84  ?Pulse:  97 (!) 101 (!) 58  ?Resp:  (!) 25 (!) 23 (!) 26  ?Temp: 98.3 ?F (36.8 ?C)     ?TempSrc: Oral     ?SpO2:  97% 100% 100%  ?Weight:      ? ?Physical Exam ?Vitals and nursing note reviewed.  ?Constitutional:   ?   General: He is not in acute distress. ?HENT:  ?   Head: Normocephalic and atraumatic.  ?Cardiovascular:  ?   Rate and Rhythm: Regular rhythm. Tachycardia present.  ?   Heart sounds: Normal heart sounds.   ?Pulmonary:  ?   Effort: Pulmonary effort is normal. Tachypnea present.  ?   Breath sounds: Normal breath sounds.  ?   Comments: Faint bibasilar crackles ?Abdominal:  ?   Palpations: Abdomen is soft.  ?   Tenderness: There is no abdominal tenderness.  ?Musculoskeletal:  ?   Right lower leg: Edema present.  ?   Left lower leg: Edema present.  ?Neurological:  ?   Mental Status: Mental status is at baseline.  ? ? ? ?Data Reviewed: ?Relevant notes from primary care and specialist visits, past discharge summaries as available in EHR, including Care Everywhere. ?Prior diagnostic testing as pertinent to current admission diagnoses ?Updated medications and problem lists for reconciliation ?ED course, including vitals, labs, imaging, treatment and response  to treatment ?Triage notes, nursing and pharmacy notes and ED provider's notes ?Notable results as noted in HPI ? ? ?Assessment and Plan: ?* Acute on chronic heart failure with preserved ejection fraction (HFpEF) ([place]) ? IV Lasix with monitoring for worsening renal function ?Continue carvedilol '25mg'$  ?Daily weights with intake and output monitoring ?We will get repeat echocardiogram ?Cardiology consult for additional recommendations ?We will keep n.p.o. in case plan to go ahead with cardioversion ? ?Acute renal failure superimposed on stage 3b chronic kidney disease ([place]) ?Likely related to ongoing diuretic therapy ?Monitor for worsening in view of IV diuretics ? ?Chronic anticoagulation ?Continue Eliquis ? ?Atrial fibrillation ([place]) ?Continue Eliquis and carvedilol ?Cardiology consult ?Keeping n.p.o. in case of cardioversion ? ? ?Chronic anemia ?At baseline ? ?Controlled type 2 diabetes mellitus without complication, without long-term current use of insulin ([place]) ?Sliding scale insulin coverage ? ?H/O paroxysmal supraventricular tachycardia s/p ablation ?Continuous cardiac monitoring with no acute issues suspected ? ?CAD (coronary artery disease) ?Appears stable.  Patient  without complaints of chest pain and first troponin 20 ?Continue rosuvastatin and carvedilol ? ? ? ? ? ? ?Advance Care Planning:   Code Status: Prior  ? ?Consults: Cardiology, Dr. Rockey Situ [facility] ? ?Family

## 2021-05-13 NOTE — ED Triage Notes (Signed)
Pt c/o fluid buildup due to CHF since February. Pt report history of A-fibb. Pt denies CP, SOB, dizziness. Abdomen is distended, 2+ pitting edema noted in LE bilaterally. Pt is AOX4, Respirations even and unlabored. Pt reports recent 25 pound fluid removal.  ?

## 2021-05-13 NOTE — Assessment & Plan Note (Signed)
At baseline 

## 2021-05-13 NOTE — ED Notes (Signed)
Patient transported to X-ray 

## 2021-05-13 NOTE — Telephone Encounter (Signed)
Patient called stating he has been having more SOB and fluid build despite more diuretics ?I instructed him to come to the ED so he can be evaluated further and decide whether needs IV diuretics or not.  ?

## 2021-05-13 NOTE — Assessment & Plan Note (Addendum)
Likely related to ongoing diuretic therapy ?--started on dialysis ?Plan: ?--HD per nephro ?

## 2021-05-14 ENCOUNTER — Encounter: Payer: Self-pay | Admitting: Internal Medicine

## 2021-05-14 ENCOUNTER — Inpatient Hospital Stay (HOSPITAL_COMMUNITY)
Admit: 2021-05-14 | Discharge: 2021-05-14 | Disposition: A | Discharge status: Home / Self Care | Payer: Medicare Other | Attending: Internal Medicine | Admitting: Internal Medicine

## 2021-05-14 DIAGNOSIS — I5031 Acute diastolic (congestive) heart failure: Secondary | ICD-10-CM | POA: Diagnosis not present

## 2021-05-14 DIAGNOSIS — I420 Dilated cardiomyopathy: Secondary | ICD-10-CM | POA: Diagnosis not present

## 2021-05-14 DIAGNOSIS — I4819 Other persistent atrial fibrillation: Secondary | ICD-10-CM

## 2021-05-14 DIAGNOSIS — N1832 Chronic kidney disease, stage 3b: Secondary | ICD-10-CM | POA: Diagnosis not present

## 2021-05-14 DIAGNOSIS — N179 Acute kidney failure, unspecified: Secondary | ICD-10-CM | POA: Diagnosis not present

## 2021-05-14 DIAGNOSIS — I25118 Atherosclerotic heart disease of native coronary artery with other forms of angina pectoris: Secondary | ICD-10-CM

## 2021-05-14 DIAGNOSIS — I5033 Acute on chronic diastolic (congestive) heart failure: Secondary | ICD-10-CM | POA: Diagnosis not present

## 2021-05-14 DIAGNOSIS — I5043 Acute on chronic combined systolic (congestive) and diastolic (congestive) heart failure: Secondary | ICD-10-CM | POA: Diagnosis not present

## 2021-05-14 LAB — ECHOCARDIOGRAM COMPLETE
AR max vel: 0.94 cm2
AV Peak grad: 10.1 mmHg
Ao pk vel: 1.59 m/s
Area-P 1/2: 4.17 cm2
Calc EF: 31.5 %
Height: 68 in
MV M vel: 4.29 m/s
MV Peak grad: 73.6 mmHg
S' Lateral: 5.04 cm
Single Plane A2C EF: 25.5 %
Single Plane A4C EF: 34.9 %
Weight: 3114.66 oz

## 2021-05-14 LAB — GLUCOSE, CAPILLARY
Glucose-Capillary: 160 mg/dL — ABNORMAL HIGH (ref 70–99)
Glucose-Capillary: 68 mg/dL — ABNORMAL LOW (ref 70–99)
Glucose-Capillary: 88 mg/dL (ref 70–99)
Glucose-Capillary: 122 mg/dL — ABNORMAL HIGH (ref 70–99)
Glucose-Capillary: 109 mg/dL — ABNORMAL HIGH (ref 70–99)
Glucose-Capillary: 158 mg/dL — ABNORMAL HIGH (ref 70–99)

## 2021-05-14 LAB — MAGNESIUM: Magnesium: 2.3 mg/dL (ref 1.7–2.4)

## 2021-05-14 LAB — BASIC METABOLIC PANEL
Anion gap: 9 (ref 5–15)
BUN: 52 mg/dL — ABNORMAL HIGH (ref 8–23)
CO2: 31 mmol/L (ref 22–32)
Calcium: 8.5 mg/dL — ABNORMAL LOW (ref 8.9–10.3)
Chloride: 103 mmol/L (ref 98–111)
Creatinine, Ser: 2.6 mg/dL — ABNORMAL HIGH (ref 0.61–1.24)
GFR, Estimated: 24 mL/min — ABNORMAL LOW (ref >60)
Glucose, Bld: 127 mg/dL — ABNORMAL HIGH (ref 70–99)
Potassium: 3.4 mmol/L — ABNORMAL LOW (ref 3.5–5.1)
Sodium: 143 mmol/L (ref 135–145)

## 2021-05-14 LAB — TROPONIN I (HIGH SENSITIVITY): Troponin I (High Sensitivity): 19 ng/L — ABNORMAL HIGH

## 2021-05-14 NOTE — Progress Notes (Signed)
*  PRELIMINARY RESULTS* ?Echocardiogram ?2D Echocardiogram has been performed. ? ?Timothy Riley ?05/14/2021, 9:10 AM ?

## 2021-05-14 NOTE — Consult Note (Addendum)
?Cardiology Consultation:  ? ?Patient ID: Timothy Riley ?MRN: [id number]; DOB: 28-Apr-1940 ? ?Admit date: 05/13/2021 ?Date of Consult: 05/14/2021 ? ?PCP:  Derinda Late, MD ?  ?[facility] Providers ?Cardiologist:  Ida Rogue, MD   { ?Physician requesting consult: Dr. Mal Misty ?Reason for consult: CHF, atrial fibrillation ? ?Patient Profile:  ? ?Timothy Riley is a 81 y.o. male with a hx of Timothy Riley is a very pleasant 81 year-old gentleman with history of   ?Persistent atrial fibrillation ?Former smoker ?Severe aortic valve stenosis,  ?status post AVR 03/01/2014 at [facility] with bovine valve,  ?SVT episodes since his AVR, requiring ablation 06/30/2014 at [facility],  ?diabetes type 2  ?BPH,  ?Low normal ejection fraction  50 to 55 ?who presents to the hospital with worsening shortness of breath, CHF exacerbation ? ?History of Present Illness:  ? ?Presenting to the hospital with worsening shortness of breath, CHF symptoms in the setting of atrial fibrillation ?Seen in clinic May 09, 2021, cardioversion was being scheduled ?On that visit carvedilol dosing increased up to 25 twice daily for better rate control ?Diuresis complicated by worsening renal dysfunction ?With outpatient diuresis creatinine up to 2.6, baseline 1.8, ? ?Reports that he called the overnight helpline given his worsening shortness of breath and they told him to go to the emergency room ? ?Echocardiogram performed this morning shows significant drop in ejection fraction down to 30% global hypokinesis with moderately elevated right heart pressures ? ?Recent records reviewed, in [place]  2/23, admission for acute on chronic diastolic CHF ?May not have been taking his Lasix as outpatient, some medication confusion ?Treated with IV Lasix, was in normal sinus rhythm ?Discharged on Lasix 40 daily ?Weight at home 190  after he got home ?When seen in the clinic, weight was up 15 pounds ?He was back in atrial fibrillation likely contributing to weight  gain ? ?He did report some noncompliance with his torsemide as outpatient ? ? ?Echo 03/2018,  ? 1. The left ventricle has normal systolic function with an ejection fraction of 60-65%. The cavity size was normal. There is mildly increased left ventricular wall thickness. Left ventricular diastolic Doppler parameters are consistent with impaired  ?relaxation. ? 2. The right ventricle has normal systolic function. The cavity was normal. There is no increase in right ventricular wall thickness. ? 3. Left atrial size was mildly dilated. ? 4. 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 Procedure Date: 03/01/14 Normal aortic valve prosthesis. AV Mean Grad: 16.0 mmHg ? 5. Right atrial pressure is estimated at 10 mmHg ?  ?Went to the emergency room November 05, 2017 ?atrial fibrillation,  ?Started on anticoagulation at that time, eliquis 5 BID ?  ? hypotensive on a previous clinic visit, stopped his tamsulosin, chlorthalidone ?  ?Previous notes from outside cardiologist says he has mild coronary artery disease, details not available through care everywhere ?Followed by endocrine for his diabetes ?  ?Prior episodes of SVT requiring adenosine ? ? ?Past Medical History:  ?Diagnosis Date  ? 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 Aortic heart valve narrowing 12/31/2013  ? Overview:  Sp #23 pericardial edwards valve repalcement 2016   ? Aortic stenosis   ? a. 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 Glower MD @ [facility] via R thoracic approach; b. 03/2018 Echo: EF 60-65%, DD. Mildly dil LA. Nl fxn AoV prosthesis. Grad 18mHg.  ? Basal cell carcinoma 05/15/2017  ? right nose above alar crease  ?  CKD (chronic kidney disease) stage 3, GFR 30-59 ml/min (HCC) 06/17/2014  ? Diabetes mellitus without complication ([place])   ? Hypertension   ? 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 Low serum HDL 10/26/2015  ? Non-obstructive CAD (coronary artery disease)   ? a.  12/2013 Cath ([facility]): LAD 30p, RI 60, otw nl (per CT surgery note from [place]); b. 03/2018 MV: EF 59%, no ischemia/scar.  ? Persistent atrial fibrillation ([place]) 10/2017  ? a. CHA2DS2VASc = 5-->Eliquis; b. 12/2017 loaded w/amio-->DCCV.  ? PSVT (paroxysmal supraventricular tachycardia) ([place])   ? a. 05/2014 s/p RFCA @ [facility].  ? Right inguinal hernia   ? Urinary incontinence   ? ? ?Past Surgical History:  ?Procedure Laterality Date  ? AORTIC VALVE REPLACEMENT    ? CARDIAC CATHETERIZATION    ? CARDIOVERSION N/A 01/01/2018  ? Procedure: CARDIOVERSION;  Surgeon: Minna Merritts, MD;  Location: [facility] ORS;  Service: Cardiovascular;  Laterality: N/A;  ? COLONOSCOPY  2010  ? heart valve replacment  2015  ? XI ROBOTIC ASSISTED INGUINAL HERNIA REPAIR WITH MESH Right 01/07/2019  ? Procedure: XI ROBOTIC ASSISTED INGUINAL HERNIA REPAIR WITH MESH;  Surgeon: Ronny Bacon, MD;  Location: [facility] ORS;  Service: General;  Laterality: Right;  ?  ? ?Home Medications:  ?Prior to Admission medications   ?Medication Sig Start Date End Date Taking? Authorizing Provider  ?carvedilol (COREG) 12.5 MG tablet Take 1 tablet (12.5 mg total) by mouth 2 (two) times daily with a meal. 03/15/21  Yes Jennye Boroughs, MD  ?Cholecalciferol (VITAMIN D3 PO) Take by mouth daily.   Yes [provider]  ?doxazosin (CARDURA) 8 MG tablet TAKE 1 TABLET BY MOUTH TWICE A DAY 05/03/21  Yes Torry Istre, Kathlene November, MD  ?ELIQUIS 2.5 MG TABS tablet TAKE 1 TABLET BY MOUTH TWICE A DAY 04/19/21  Yes Arryn Terrones, Kathlene November, MD  ?glipiZIDE (GLUCOTROL) 10 MG tablet TAKE 1 TABLET BY MOUTH TWICE A DAY BEFORE A MEAL. ?Patient taking differently: Take 10 mg by mouth daily before breakfast. 01/28/20  Yes Lebron Conners D, MD  ?pioglitazone (ACTOS) 30 MG tablet TAKE 1 TABLET BY MOUTH ONCE DAILY 01/20/20  Yes Towanda Malkin, MD  ?potassium chloride SA (KLOR-CON M) 20 MEQ tablet Take 1 tablet (20 mEq total) by mouth 2 (two) times daily. 04/25/21  Yes Minna Merritts, MD   ?rosuvastatin (CRESTOR) 10 MG tablet Take 1 tablet (10 mg total) by mouth daily. 06/29/20  Yes Sowles, Drue Stager, MD  ?torsemide (DEMADEX) 20 MG tablet Take 2 tablets (40 mg total) by mouth 2 (two) times daily. 04/25/21 07/24/21 Yes Zona Pedro, Kathlene November, MD  ?amoxicillin (AMOXIL) 500 MG capsule TAKE 4 CAPSULES 1 HOUR BEFORE DENTAL APPOINTMENT ?Patient not taking: Reported on 04/25/2021 04/28/20   Minna Merritts, MD  ? ? ?Inpatient Medications: ?Scheduled Meds: ? apixaban  2.5 mg Oral BID  ? carvedilol  12.5 mg Oral BID WC  ? furosemide  40 mg Intravenous Q12H  ? insulin aspart  0-15 Units Subcutaneous Q4H  ? potassium chloride SA  20 mEq Oral BID  ? rosuvastatin  10 mg Oral Daily  ? ?Continuous Infusions: ? ?PRN Meds: ?acetaminophen **OR** acetaminophen, ondansetron **OR** ondansetron (ZOFRAN) IV ? ?Allergies:    ?Allergies  ?Allergen Reactions  ? Gabapentin Other (See Comments) and Rash  ?  dizzy ?dizzy  ? Hydralazine Anxiety and Rash  ? Hydralazine Hcl Rash  ? ? ?Social History:   ?Social History  ? ?Socioeconomic History  ?  Marital status: Married  ?  Spouse name: betty  ? Number of children: 2  ? Years of education: 32  ? Highest education level: High school graduate  ?Occupational History  ? Occupation: retired  ?  Comment: truck driver  ?Tobacco Use  ? Smoking status: Former  ?  Packs/day: 0.50  ?  Years: 1.00  ?  Pack years: 0.50  ?  Types: Cigarettes  ?  Quit date: 01/30/1972  ?  Years since quitting: 49.3  ? Smokeless tobacco: Never  ? Tobacco comments:  ?  over 45 yrs ago  ?Vaping Use  ? Vaping Use: Never used  ?Substance and Sexual Activity  ? Alcohol use: No  ?  Alcohol/week: 0.0 standard drinks  ? Drug use: No  ? Sexual activity: Not Currently  ?Other Topics Concern  ? Not on file  ?Social History Narrative  ? Not on file  ? ?Social Determinants of Health  ? ?Financial Resource Strain: Not on file  ?Food Insecurity: Not on file  ?Transportation Needs: Not on file  ?Physical Activity: Not on file  ?Stress: Not on  file  ?Social Connections: Not on file  ?Intimate Partner Violence: Not on file  ?  ?Family History:   ? ?Family History  ?Problem Relation Age of Onset  ? Cancer Mother   ? Stroke Father   ?  ? ?ROS:  ?Please see the

## 2021-05-14 NOTE — Progress Notes (Addendum)
? ? ? ?Progress Note  ? ? ?Timothy Riley All  YPP:[id number] DOB: 1940/02/03  DOA: 05/13/2021 ?PCP: Derinda Late, MD  ? ? ? ? ?Brief Narrative:  ? ? ?Medical records reviewed and are as summarized below: ? ?Timothy Riley is a 81 y.o. male with medical history significant for Persistent A-fib with prior cardioversion, on Eliquis, s/p bioprosthetic AVR, G2 DD (EF 50 to 55% 08/2020), diabetes, nonobstructive CAD with low risk Myoview in 2020, HTN, CKD 3B,hospitalized from 2/8 to 03/15/2021 with acute gastroenteritis and CHF exacerbation  complicated by AKI related to diuretic therapy.  He was last seen by cardiologist, Dr. Rockey Situ on 05/09/2021 and review of the note reveals that the plan was to arrange cardioversion to address A-fib.  His Coreg was also increased. ?He presented to the hospital because of shortness of breath, abdominal distention bilateral lower extremity swelling. ? ? ? ? ? ? ?Assessment/Plan:  ? ?Principal Problem: ?  Acute on chronic heart failure with preserved ejection fraction (HFpEF) ([place]) ?Active Problems: ?  Acute renal failure superimposed on stage 3b chronic kidney disease ([place]) ?  Atrial fibrillation ([place]) ?  Chronic anticoagulation ?  CAD (coronary artery disease) ?  H/O paroxysmal supraventricular tachycardia s/p ablation ?  Controlled type 2 diabetes mellitus without complication, without long-term current use of insulin ([place]) ?  Chronic anemia ? ? ?Body mass index is 29.6 kg/m?. ? ? ?Acute on chronic diastolic CHF: Continue IV Lasix.  Monitor BMP, daily weight and urine output.  2D echo is pending. ? 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  Continue IV Lasix and monitor BMP. ? ?Hypokalemia: Replete potassium and monitor levels.  Check magnesium level. ? ?History of paroxysmal SVT, persistent atrial fibrillation: Continue carvedilol and Eliquis.  Follow-up with cardiologist. ? ?Type II DM, episode of hypoglycemia: Glucose was 68 this morning likely from n.p.o. status.   No plan for any procedure today.  Resume diet. ?Home glipizide and pioglitazone have been held. ? ?Other comorbidities include chronic anemia, CAD ? ? ?Diet Order   ? ?       ?  Diet heart healthy/carb modified Room service appropriate? Yes; Fluid consistency: Thin  Diet effective now       ?  ? ?  ?  ? ?  ? ? ? ? ? ? ? ? ? ? ? ?Consultants: ?Cardiologist ? ?Procedures: ?None ? ? ? ?Medications:  ? ? apixaban  2.5 mg Oral BID  ? carvedilol  12.5 mg Oral BID WC  ? furosemide  40 mg Intravenous Q12H  ? insulin aspart  0-15 Units Subcutaneous Q4H  ? potassium chloride SA  20 mEq Oral BID  ? rosuvastatin  10 mg Oral Daily  ? ?Continuous Infusions: ? ? ?Anti-infectives (From admission, onward)  ? ? None  ? ?  ? ? ? ? ? ? ? ? ? ?Family Communication/Anticipated D/C date and plan/Code Status  ? ?DVT prophylaxis: apixaban (ELIQUIS) tablet 2.5 mg Start: 05/14/21 0000 ?apixaban (ELIQUIS) tablet 2.5 mg  ? ?  Code Status: Full Code ? ?Family Communication: None ?Disposition Plan: Plan to discharge home in 2 to 3 days ? ? ?Status is: Inpatient ?Remains inpatient appropriate because: IV Lasix ? ? ? ? ? ? ?Subjective:  ? ?Interval events noted.  He complains of abdominal distention and leg swelling.  Breathing is a little better.  Abdominal distention is also improving. ? ?Objective:  ? ? ?Vitals:  ? 05/14/21 0136 05/14/21 0146 05/14/21 4580  05/14/21 0853  ?BP: 130/84  109/77 121/78  ?Pulse: (!) 104  96 94  ?Resp: '19  16 17  '$ ?Temp: 98.1 ?F (36.7 ?C)  (!) 97.5 ?F (36.4 ?C) 97.8 ?F (36.6 ?C)  ?TempSrc: Oral     ?SpO2: 97%  94% 95%  ?Weight:  88.3 kg    ?Height:  '5\' 8"'$  (1.727 m)    ? ?No data found. ? ? ?Intake/Output Summary (Last 24 hours) at 05/14/2021 0917 ?Last data filed at 05/14/2021 0858 ?Gross per 24 hour  ?Intake 0 ml  ?Output 1000 ml  ?Net -1000 ml  ? ?Filed Weights  ? 05/13/21 2032 05/14/21 0146  ?Weight: 92.1 kg 88.3 kg  ? ? ?Exam: ? ? ?GEN: NAD ?SKIN: No rash ?EYES: EOMI ?ENT: MMM ?CV: RRR ?PULM: Bibasilar rales, no  wheezing ?ABD: soft, distended, NT, +BS ?CNS: AAO x 3, non focal ?EXT: B/l leg edema, no tenderness ? ? ?  ? ? ?Data Reviewed:  ? ?I have personally reviewed following labs and imaging studies: ? ?Labs: ?Labs show the following:  ? ?Basic Metabolic Panel: ?Recent Labs  ?Lab 05/09/21 ?1135 05/13/21 ?2037 05/14/21 ?3532  ?NA 148* 142 143  ?K 3.8 4.0 3.4*  ?CL 102 101 103  ?CO2 30* 30 31  ?GLUCOSE 201* 76 127*  ?BUN 37* 55* 52*  ?CREATININE 2.40* 2.65* 2.60*  ?CALCIUM 9.0 8.6* 8.5*  ? ?GFR ?Estimated Creatinine Clearance: 23.7 mL/min (A) (by C-G formula based on SCr of 2.6 mg/dL (H)). ?Liver Function Tests: ?No results for input(s): AST, ALT, ALKPHOS, BILITOT, PROT, ALBUMIN in the last 168 hours. ?No results for input(s): LIPASE, AMYLASE in the last 168 hours. ?No results for input(s): AMMONIA in the last 168 hours. ?Coagulation profile ?No results for input(s): INR, PROTIME in the last 168 hours. ? ?CBC: ?Recent Labs  ?Lab 05/09/21 ?1135 05/13/21 ?2037  ?WBC 5.8 5.5  ?HGB 10.5* 10.7*  ?HCT 32.8* 34.3*  ?MCV 88 89.3  ?PLT 157 161  ? ?Cardiac Enzymes: ?No results for input(s): CKTOTAL, CKMB, CKMBINDEX, TROPONINI in the last 168 hours. ?BNP (last 3 results) ?No results for input(s): PROBNP in the last 8760 hours. ?CBG: ?Recent Labs  ?Lab 05/14/21 ?0350 05/14/21 ?9924  ?GLUCAP 160* 68*  ? ?D-Dimer: ?No results for input(s): DDIMER in the last 72 hours. ?Hgb A1c: ?No results for input(s): HGBA1C in the last 72 hours. ?Lipid Profile: ?No results for input(s): CHOL, HDL, LDLCALC, TRIG, CHOLHDL, LDLDIRECT in the last 72 hours. ?Thyroid function studies: ?No results for input(s): TSH, T4TOTAL, T3FREE, THYROIDAB in the last 72 hours. ? ?Invalid input(s): FREET3 ?Anemia work up: ?No results for input(s): VITAMINB12, FOLATE, FERRITIN, TIBC, IRON, RETICCTPCT in the last 72 hours. ?Sepsis Labs: ?Recent Labs  ?Lab 05/09/21 ?1135 05/13/21 ?2037  ?WBC 5.8 5.5  ? ? ?Microbiology ?No results found for this or any previous visit (from  the past 240 hour(s)). ? ?Procedures and diagnostic studies: ? ?DG Chest 2 View ? ?Result Date: 05/13/2021 ?CLINICAL DATA:  Patient complains of fluid buildup due to CHF since February. EXAM: CHEST - 2 VIEW COMPARISON:  March 09, 2021 FINDINGS: Multiple fractured sternal wires are seen. The cardiac silhouette is mildly enlarged and unchanged in size. Mild, chronic appearing increased lung markings are seen with mild atelectasis noted within the right lung base. There is no evidence of pleural effusion or pneumothorax. The visualized skeletal structures are unremarkable. IMPRESSION: 1. Chronic appearing increased lung markings with mild right basilar atelectasis. 2. Stable cardiomegaly with evidence of  prior median sternotomy. Electronically Signed   By: Virgina Norfolk M.D.   On: 05/13/2021 21:18   ? ? ? ? ? ? ? ? ? ? ? ? LOS: 1 day  ? ?Emryn Flanery  ?[facility]  ? ?Pager on [email or web address]. If 7PM-7AM, please contact night-coverage at [email or web address] ? ? ? ? ?05/14/2021, 9:17 AM  ? ? ? ? ? ? ? ? ? ?

## 2021-05-15 ENCOUNTER — Ambulatory Visit: Payer: Medicare Other | Admitting: Cardiovascular Disease

## 2021-05-15 DIAGNOSIS — I4819 Other persistent atrial fibrillation: Secondary | ICD-10-CM | POA: Diagnosis not present

## 2021-05-15 DIAGNOSIS — I5033 Acute on chronic diastolic (congestive) heart failure: Secondary | ICD-10-CM | POA: Diagnosis not present

## 2021-05-15 LAB — GLUCOSE, CAPILLARY
Glucose-Capillary: 100 mg/dL — ABNORMAL HIGH (ref 70–99)
Glucose-Capillary: 140 mg/dL — ABNORMAL HIGH (ref 70–99)
Glucose-Capillary: 156 mg/dL — ABNORMAL HIGH (ref 70–99)
Glucose-Capillary: 193 mg/dL — ABNORMAL HIGH (ref 70–99)
Glucose-Capillary: 94 mg/dL (ref 70–99)
Glucose-Capillary: 96 mg/dL (ref 70–99)
Glucose-Capillary: 98 mg/dL (ref 70–99)

## 2021-05-15 LAB — BASIC METABOLIC PANEL
Anion gap: 10 (ref 5–15)
BUN: 51 mg/dL — ABNORMAL HIGH (ref 8–23)
CO2: 32 mmol/L (ref 22–32)
Calcium: 8.5 mg/dL — ABNORMAL LOW (ref 8.9–10.3)
Chloride: 102 mmol/L (ref 98–111)
Creatinine, Ser: 2.46 mg/dL — ABNORMAL HIGH (ref 0.61–1.24)
GFR, Estimated: 26 mL/min — ABNORMAL LOW (ref >60)
Glucose, Bld: 100 mg/dL — ABNORMAL HIGH (ref 70–99)
Potassium: 3.6 mmol/L (ref 3.5–5.1)
Sodium: 144 mmol/L (ref 135–145)

## 2021-05-15 LAB — TSH: TSH: 5.107 u[IU]/mL — ABNORMAL HIGH (ref 0.350–4.500)

## 2021-05-15 LAB — MAGNESIUM: Magnesium: 2.3 mg/dL (ref 1.7–2.4)

## 2021-05-15 MED ORDER — TORSEMIDE 20 MG PO TABS
40.0000 mg | ORAL_TABLET | Freq: Two times a day (BID) | ORAL | Status: DC
  Administered 2021-05-16 (×2): 40 mg via ORAL
  Filled 2021-05-15 (×2): qty 2

## 2021-05-15 MED ORDER — AMIODARONE HCL 200 MG PO TABS
400.0000 mg | ORAL_TABLET | Freq: Two times a day (BID) | ORAL | Status: AC
  Administered 2021-05-15 – 2021-05-27 (×24): 400 mg via ORAL
  Filled 2021-05-15 (×25): qty 2

## 2021-05-15 MED ORDER — SODIUM CHLORIDE 0.9 % IV SOLN: INTRAVENOUS | Status: DC

## 2021-05-15 NOTE — Progress Notes (Signed)
? ?Progress Note ? ?Patient Name: Trevante Tennell [facility] ?Date of Encounter: 05/15/2021 ? ?[facility] Cardiologist: Ida Rogue, MD  ? ?Subjective  ? ?He remains in rate controlled Afib. UOP -1.2L. He overall is feeling much better.  ? ?Inpatient Medications  ?  ?Scheduled Meds: ? apixaban  2.5 mg Oral BID  ? carvedilol  12.5 mg Oral BID WC  ? furosemide  40 mg Intravenous Q12H  ? insulin aspart  0-15 Units Subcutaneous Q4H  ? potassium chloride SA  20 mEq Oral BID  ? rosuvastatin  10 mg Oral Daily  ? ?Continuous Infusions: ? ?PRN Meds: ?acetaminophen **OR** acetaminophen, ondansetron **OR** ondansetron (ZOFRAN) IV  ? ?Vital Signs  ?  ?Vitals:  ? 05/14/21 2017 05/14/21 2356 05/15/21 0452 05/15/21 0500  ?BP: 130/77 107/75 123/70   ?Pulse: 93 94 91   ?Resp: '16 17 18   '$ ?Temp: 98 ?F (36.7 ?C) 98.7 ?F (37.1 ?C) 97.6 ?F (36.4 ?C)   ?TempSrc: Oral Oral Oral   ?SpO2: 94% 98% 95%   ?Weight:    89.3 kg  ?Height:      ? ? ?Intake/Output Summary (Last 24 hours) at 05/15/2021 0745 ?Last data filed at 05/15/2021 0300 ?Gross per 24 hour  ?Intake 530 ml  ?Output 1200 ml  ?Net -670 ml  ? ? ?  05/15/2021  ?  5:00 AM 05/14/2021  ?  1:46 AM 05/13/2021  ?  8:32 PM  ?Last 3 Weights  ?Weight (lbs) 196 lb 13.9 oz 194 lb 10.7 oz 203 lb  ?Weight (kg) 89.3 kg 88.3 kg 92.08 kg  ?   ? ?Telemetry  ?  ?Afib HR 90-100 - Personally Reviewed ? ?ECG  ?  ?No new - Personally Reviewed ? ?Physical Exam  ? ?GEN: No acute distress.   ?Neck: minimal JVD ?Cardiac: Irreg Irreg, no murmurs, rubs, or gallops.  ?Respiratory: Clear to auscultation bilaterally. ?GI: Soft, nontender, non-distended  ?MS: 1+ lower leg edema; No deformity. ?Neuro:  Nonfocal  ?Psych: Normal affect  ? ?Labs  ?  ?High Sensitivity Troponin:   ?Recent Labs  ?Lab 05/13/21 ?2037 05/14/21 ?9476  ?TROPONINIHS 20* 19*  ?   ?Chemistry ?Recent Labs  ?Lab 05/13/21 ?2037 05/14/21 ?5465 05/15/21 ?0354  ?NA 142 143 144  ?K 4.0 3.4* 3.6  ?CL 101 103 102  ?CO2 30 31 32  ?GLUCOSE 76 127* 100*  ?BUN 55* 52*  51*  ?CREATININE 2.65* 2.60* 2.46*  ?CALCIUM 8.6* 8.5* 8.5*  ?MG  --  2.3 2.3  ?GFRNONAA 23* 24* 26*  ?ANIONGAP '11 9 10  '$ ?  ?Lipids No results for input(s): CHOL, TRIG, HDL, LABVLDL, LDLCALC, CHOLHDL in the last 168 hours.  ?Hematology ?Recent Labs  ?Lab 05/09/21 ?1135 05/13/21 ?2037  ?WBC 5.8 5.5  ?RBC 3.74* 3.84*  ?HGB 10.5* 10.7*  ?HCT 32.8* 34.3*  ?MCV 88 89.3  ?MCH 28.1 27.9  ?MCHC 32.0 31.2  ?RDW 15.1 16.0*  ?PLT 157 161  ? ?Thyroid No results for input(s): TSH, FREET4 in the last 168 hours.  ?BNP ?Recent Labs  ?Lab 05/13/21 ?2034  ?BNP 1,597.6*  ?  ?DDimer No results for input(s): DDIMER in the last 168 hours.  ? ?Radiology  ?  ?DG Chest 2 View ? ?Result Date: 05/13/2021 ?CLINICAL DATA:  Patient complains of fluid buildup due to CHF since February. EXAM: CHEST - 2 VIEW COMPARISON:  March 09, 2021 FINDINGS: Multiple fractured sternal wires are seen. The cardiac silhouette is mildly enlarged and unchanged in size. Mild, chronic  appearing increased lung markings are seen with mild atelectasis noted within the right lung base. There is no evidence of pleural effusion or pneumothorax. The visualized skeletal structures are unremarkable. IMPRESSION: 1. Chronic appearing increased lung markings with mild right basilar atelectasis. 2. Stable cardiomegaly with evidence of prior median sternotomy. Electronically Signed   By: Virgina Norfolk M.D.   On: 05/13/2021 21:18  ? ?ECHOCARDIOGRAM COMPLETE ? ?Result Date: 05/14/2021 ?   ECHOCARDIOGRAM REPORT   Patient Name:   ALDEAN PIPE [facility] Date of Exam: 05/14/2021 Medical Rec #:  [id number]       Height:       68.0 in Accession #:    [id number]      Weight:       194.7 lb Date of Birth:  24-May-1940      BSA:          2.020 m? Patient Age:    81 years        BP:           109/77 mmHg Patient Gender: M               HR:           86 bpm. Exam Location:  [facility] Procedure: 2D Echo Indications:     CHF I50.31  History:         Patient has no prior history of Echocardiogram  examinations.  Sonographer:     Kathlen Brunswick RDCS Referring Phys:  [id number] Athena Masse Diagnosing Phys: Ida Rogue MD IMPRESSIONS  1. Left ventricular ejection fraction, by estimation, is 30 to 35%. The left ventricle has moderately decreased function. The left ventricle demonstrates global hypokinesis. The left ventricular internal cavity size was moderately dilated. Left ventricular diastolic parameters are indeterminate.  2. Right ventricular systolic function is moderately reduced. The right ventricular size is moderately enlarged. There is moderately elevated pulmonary artery systolic pressure. The estimated right ventricular systolic pressure is 06.2 mmHg.  3. Left atrial size was moderately dilated.  4. The mitral valve is normal in structure. Moderate mitral valve regurgitation. No evidence of mitral stenosis.  5. Tricuspid valve regurgitation is moderate.  6. The aortic valve has been repaired/replaced. 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 Aortic valve regurgitation is not visualized. No aortic stenosis is present.  7. The inferior vena cava is dilated in size with <50% respiratory variability, suggesting right atrial pressure of 15 mmHg. FINDINGS  Left Ventricle: Left ventricular ejection fraction, by estimation, is 30 to 35%. The left ventricle has moderately decreased function. The left ventricle demonstrates global hypokinesis. The left ventricular internal cavity size was moderately dilated. There is no left ventricular hypertrophy. Left ventricular diastolic parameters are indeterminate. Right Ventricle: The right ventricular size is moderately enlarged. No increase in right ventricular wall thickness. Right ventricular systolic function is moderately reduced. There is moderately elevated pulmonary artery systolic pressure. The tricuspid  regurgitant velocity is 2.74 m/s, and with an assumed right atrial pressure of 15 mmHg, the estimated right  ventricular systolic pressure is 37.6 mmHg. Left Atrium: Left atrial size was moderately dilated. Right Atrium: Right atrial size was normal in size. Pericardium: There is no evidence of pericardial effusion. Mitral Valve: The mitral valve is normal in structure. Moderate mitral valve regurgitation. No evidence of mitral valve stenosis. Tricuspid Valve: The tricuspid valve is normal in structure. Tricuspid valve regurgitation is moderate . No evidence of tricuspid stenosis. Aortic Valve: The aortic valve has  been repaired/replaced. Aortic valve regurgitation is not visualized. No aortic stenosis is present. Aortic valve peak gradient measures 10.1 mmHg. Pulmonic Valve: The pulmonic valve was normal in structure. Pulmonic valve regurgitation is not visualized. No evidence of pulmonic stenosis. Aorta: The aortic root is normal in size and structure. Venous: The inferior vena cava is dilated in size with less than 50% respiratory variability, suggesting right atrial pressure of 15 mmHg. IAS/Shunts: No atrial level shunt detected by color flow Doppler.  LEFT VENTRICLE PLAX 2D LVIDd:         6.16 cm      Diastology LVIDs:         5.04 cm      LV e' medial:    4.46 cm/s LV PW:         1.17 cm      LV E/e' medial:  23.5 LV IVS:        1.08 cm      LV e' lateral:   6.53 cm/s LVOT diam:     1.80 cm      LV E/e' lateral: 16.1 LV SV:         23 LV SV Index:   12 LVOT Area:     2.54 cm?  LV Volumes (MOD) LV vol d, MOD A2C: 157.0 ml LV vol d, MOD A4C: 138.0 ml LV vol s, MOD A2C: 117.0 ml LV vol s, MOD A4C: 89.9 ml LV SV MOD A2C:     40.0 ml LV SV MOD A4C:     138.0 ml LV SV MOD BP:      48.3 ml RIGHT VENTRICLE RV Basal diam:  4.60 cm RV S prime:     7.40 cm/s TAPSE (M-mode): 1.2 cm LEFT ATRIUM             Index        RIGHT ATRIUM           Index LA diam:        5.10 cm 2.52 cm/m?   RA Area:     20.30 cm? LA Vol (A2C):   90.7 ml 44.89 ml/m?  RA Volume:   59.10 ml  29.25 ml/m? LA Vol (A4C):   61.3 ml 30.34 ml/m? LA Biplane Vol:  78.8 ml 39.00 ml/m?  AORTIC VALVE                 PULMONIC VALVE AV Area (Vmax): 0.94 cm?     PV Vmax:          0.50 m/s AV Vmax:        159.00 cm/s  PV Peak grad:     1.0 mmHg AV Peak Grad:   10.1 mmHg    PR End Diast Ve

## 2021-05-15 NOTE — Consult Note (Addendum)
? ?  Heart Failure Nurse Navigator Note ? ?HFrEF 30-35%.  Right ventricular systolic function moderately reduced.  Moderate left atrial enlargement.  Moderate mitral regurgitation.  Moderate tricuspid regurgitation. ? ? ?Presented to the emergency room with complaints of worsening shortness of breath, lower extremity edema, abdominal distention despite increased diuretic therapy. ? ?Comorbidities: ? ?Diabetes ?Nonobstructive coronary artery disease ?Hypertension ?Chronic kidney disease stage III ?Bioprosthetic aortic valve replacement ? ? ?Medications: ? ?Eliquis 2.5 mg 2 times a day ?Carvedilol 12.5 milligrams 2 times a day ?Furosemide 40 mg IV every 12 hours ?Potassium chloride 20 mEq 2 times a day ?Rosuvastatin 10 mg daily ? ?Labs: ? ?Sodium 144, potassium 3.6, chloride 102, CO2 2032, BUN 51, creatinine 2.46, GFR 26. ?Weight is 89.3 kg ?Blood pressure 114/78 ?Intake 530 mL ?Output 1200 mL ? ?Initial meeting with patient on this admission, he was sitting on the side of the bed.  Had tried to visit him twice during the morning time but he was asleep both times.  States that that is the first decent Sleep he had since being in the hospital. ? 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 ? ?Discussed his symptoms that brought him to the hospital.  He does weigh himself on a daily basis, discussed parameters when contact physician with changes in weight. ? ?He states that he usually eats breakfast out and will get a biscuit, discussed making wiser choices of wheat toast and scrambled eggs, he does not use salt at the table. ? ?Went over fluid restriction and what constitutes fluids. ? ?Also discussed the outpatient heart failure clinic for which she has an appointment on the second at 10 AM.  He has a 5% history of no-show which is 3 out of 56 appointments ? ?He was given the living with heart failure teaching booklet, zone magnet, information on low-sodium and weight chart. ? ?Will continue to follow. ? ?Pricilla Riffle  RN [facility] ?

## 2021-05-15 NOTE — Progress Notes (Signed)
? ? ? ?Progress Note  ? ? ?Timothy Riley  WGN:[id number] DOB: [birth year]  DOA: 05/13/2021 ?PCP: Derinda Late, MD  ? ? ? ? ?Brief Narrative:  ? ? ?Medical records reviewed and are as summarized below: ? ?Timothy Riley is a 81 y.o. male with medical history significant for Persistent A-fib with prior cardioversion, on Eliquis, s/p bioprosthetic AVR, G2 DD (EF 50 to 55% 08/2020), diabetes, nonobstructive CAD with low risk Myoview in 2020, HTN, CKD 3B,hospitalized from 2/8 to 03/15/2021 with acute gastroenteritis and CHF exacerbation  complicated by AKI related to diuretic therapy.  He was last seen by cardiologist, Dr. Rockey Situ on 05/09/2021 and review of the note reveals that the plan was to arrange cardioversion to address A-fib.  His Coreg was also increased. ?He presented to the hospital because of shortness of breath, abdominal distention bilateral lower extremity swelling. ? ? ? ? ? ? ?Assessment/Plan:  ? ?Principal Problem: ?  Acute on chronic heart failure with preserved ejection fraction (HFpEF) ([place]) ?Active Problems: ?  Acute renal failure superimposed on stage 3b chronic kidney disease ([place]) ?  Atrial fibrillation ([place]) ?  Chronic anticoagulation ?  CAD (coronary artery disease) ?  H/O paroxysmal supraventricular tachycardia s/p ablation ?  Controlled type 2 diabetes mellitus without complication, without long-term current use of insulin ([place]) ?  Chronic anemia ? ? ?Body mass index is 29.93 kg/m?. ? ? ?Acute on chronic systolic and diastolic diastolic CHF: Continue IV Lasix.  Monitor daily weight, urine output and BMP.  2D echo showed EF estimated at 30 to 35%, moderate MR, TR.  ? ?AKI on CKD stage IIIb: Creatinine is trending down. Probably from cardiorenal syndrome.  Continue IV Lasix and monitor BMP. ? ?Hypokalemia: Improving ? ?History of paroxysmal SVT, persistent atrial fibrillation: Continue carvedilol and Eliquis.  Plan for cardioversion when he is euvolemic.  Follow-up with cardiologist. ? ?Type  II DM, episode of hypoglycemia: NovoLog as needed for hyperglycemia. ? ?Other comorbidities include chronic anemia, CAD ? ? ?Diet Order   ? ?       ?  Diet heart healthy/carb modified Room service appropriate? Yes; Fluid consistency: Thin  Diet effective now       ?  ? ?  ?  ? ?  ? ? ? ? ? ? ? ? ? ? ? ?Consultants: ?Cardiologist ? ?Procedures: ?None ? ? ? ?Medications:  ? ? apixaban  2.5 mg Oral BID  ? carvedilol  12.5 mg Oral BID WC  ? furosemide  40 mg Intravenous Q12H  ? insulin aspart  0-15 Units Subcutaneous Q4H  ? potassium chloride SA  20 mEq Oral BID  ? rosuvastatin  10 mg Oral Daily  ? ?Continuous Infusions: ? ? ?Anti-infectives (From admission, onward)  ? ? None  ? ?  ? ? ? ? ? ? ? ? ? ?Family Communication/Anticipated D/C date and plan/Code Status  ? ?DVT prophylaxis: apixaban (ELIQUIS) tablet 2.5 mg Start: 05/14/21 0000 ?apixaban (ELIQUIS) tablet 2.5 mg  ? ?  Code Status: Full Code ? ?Family Communication: None ?Disposition Plan: Plan to discharge home in 2 to 3 days ? ? ?Status is: Inpatient ?Remains inpatient appropriate because: IV Lasix ? ? ? ? ? ? ?Subjective:  ? ?Interval events noted.  He said he feels better.  He also noticed improvement in abdominal distention and leg swelling.  Breathing is a little better. ? ?Objective:  ? ? ?Vitals:  ? 05/15/21 0452 05/15/21 0500 05/15/21 0832 05/15/21 1226  ?  BP: 123/70  137/86 114/78  ?Pulse: 91  (!) 110 98  ?Resp: '18  16 16  '$ ?Temp: 97.6 ?F (36.4 ?C)  98 ?F (36.7 ?C) (!) 97.5 ?F (36.4 ?C)  ?TempSrc: Oral     ?SpO2: 95%  96% 95%  ?Weight:  89.3 kg    ?Height:      ? ?No data found. ? ? ?Intake/Output Summary (Last 24 hours) at 05/15/2021 1315 ?Last data filed at 05/15/2021 1200 ?Gross per 24 hour  ?Intake 480 ml  ?Output 1100 ml  ?Net -620 ml  ? ?Filed Weights  ? 05/13/21 2032 05/14/21 0146 05/15/21 0500  ?Weight: 92.1 kg 88.3 kg 89.3 kg  ? ? ?Exam: ? ?GEN: NAD ?SKIN: No rash ?EYES: EOMI ?ENT: MMM ?CV: Irregular rate and rhythm ?PULM: Bibasilar rales, no  wheezing ?ABD: soft, distension is improving, NT, +BS ?CNS: AAO x 3, non focal ?EXT: Leg edema is improving. No tenderness ? ? ? ?Data Reviewed:  ? ?I have personally reviewed following labs and imaging studies: ? ?Labs: ?Labs show the following:  ? ?Basic Metabolic Panel: ?Recent Labs  ?Lab 05/09/21 ?1135 05/13/21 ?2037 05/14/21 ?2426 05/15/21 ?8341  ?NA 148* 142 143 144  ?K 3.8 4.0 3.4* 3.6  ?CL 102 101 103 102  ?CO2 30* 30 31 32  ?GLUCOSE 201* 76 127* 100*  ?BUN 37* 55* 52* 51*  ?CREATININE 2.40* 2.65* 2.60* 2.46*  ?CALCIUM 9.0 8.6* 8.5* 8.5*  ?MG  --   --  2.3 2.3  ? ?GFR ?Estimated Creatinine Clearance: 25.1 mL/min (A) (by C-G formula based on SCr of 2.46 mg/dL (H)). ?Liver Function Tests: ?No results for input(s): AST, ALT, ALKPHOS, BILITOT, PROT, ALBUMIN in the last 168 hours. ?No results for input(s): LIPASE, AMYLASE in the last 168 hours. ?No results for input(s): AMMONIA in the last 168 hours. ?Coagulation profile ?No results for input(s): INR, PROTIME in the last 168 hours. ? ?CBC: ?Recent Labs  ?Lab 05/09/21 ?1135 05/13/21 ?2037  ?WBC 5.8 5.5  ?HGB 10.5* 10.7*  ?HCT 32.8* 34.3*  ?MCV 88 89.3  ?PLT 157 161  ? ?Cardiac Enzymes: ?No results for input(s): CKTOTAL, CKMB, CKMBINDEX, TROPONINI in the last 168 hours. ?BNP (last 3 results) ?No results for input(s): PROBNP in the last 8760 hours. ?CBG: ?Recent Labs  ?Lab 05/14/21 ?2022 05/15/21 ?0025 05/15/21 ?0346 05/15/21 ?0831 05/15/21 ?1226  ?GLUCAP 158* 96 100* 98 193*  ? ?D-Dimer: ?No results for input(s): DDIMER in the last 72 hours. ?Hgb A1c: ?No results for input(s): HGBA1C in the last 72 hours. ?Lipid Profile: ?No results for input(s): CHOL, HDL, LDLCALC, TRIG, CHOLHDL, LDLDIRECT in the last 72 hours. ?Thyroid function studies: ?Recent Labs  ?  05/15/21 ?0605  ?TSH 5.107*  ? ?Anemia work up: ?No results for input(s): VITAMINB12, FOLATE, FERRITIN, TIBC, IRON, RETICCTPCT in the last 72 hours. ?Sepsis Labs: ?Recent Labs  ?Lab 05/09/21 ?1135 05/13/21 ?2037   ?WBC 5.8 5.5  ? ? ?Microbiology ?No results found for this or any previous visit (from the past 240 hour(s)). ? ?Procedures and diagnostic studies: ? ?DG Chest 2 View ? ?Result Date: 05/13/2021 ?CLINICAL DATA:  Patient complains of fluid buildup due to CHF since February. EXAM: CHEST - 2 VIEW COMPARISON:  March 09, 2021 FINDINGS: Multiple fractured sternal wires are seen. The cardiac silhouette is mildly enlarged and unchanged in size. Mild, chronic appearing increased lung markings are seen with mild atelectasis noted within the right lung base. There is no evidence of pleural effusion or pneumothorax.  The visualized skeletal structures are unremarkable. IMPRESSION: 1. Chronic appearing increased lung markings with mild right basilar atelectasis. 2. Stable cardiomegaly with evidence of prior median sternotomy. Electronically Signed   By: Virgina Norfolk M.D.   On: 05/13/2021 21:18  ? ?ECHOCARDIOGRAM COMPLETE ? ?Result Date: 05/14/2021 ?   ECHOCARDIOGRAM REPORT   Patient Name:   ANAY RATHE [facility] Date of Exam: 05/14/2021 Medical Rec #:  [id number]       Height:       68.0 in Accession #:    [id number]      Weight:       194.7 lb Date of Birth:  [birth year]      BSA:          2.020 m? Patient Age:    74 years        BP:           109/77 mmHg Patient Gender: M               HR:           86 bpm. Exam Location:  [facility] Procedure: 2D Echo Indications:     CHF I50.31  History:         Patient has no prior history of Echocardiogram examinations.  Sonographer:     Kathlen Brunswick RDCS Referring Phys:  [id number] Athena Masse Diagnosing Phys: Ida Rogue MD IMPRESSIONS  1. Left ventricular ejection fraction, by estimation, is 30 to 35%. The left ventricle has moderately decreased function. The left ventricle demonstrates global hypokinesis. The left ventricular internal cavity size was moderately dilated. Left ventricular diastolic parameters are indeterminate.  2. Right ventricular systolic function is moderately reduced.  The right ventricular size is moderately enlarged. There is moderately elevated pulmonary artery systolic pressure. The estimated right ventricular systolic pressure is 14.9 mmHg.  3. Left atrial size was mode

## 2021-05-15 NOTE — Progress Notes (Signed)
Normal Saline at 20 ml/hr administered ?

## 2021-05-16 ENCOUNTER — Encounter: Payer: Self-pay | Admitting: Anesthesiology

## 2021-05-16 ENCOUNTER — Encounter
Admission: EM | Disposition: A | Discharge status: Home Health | Payer: Self-pay | Source: Home / Self Care | Attending: Internal Medicine

## 2021-05-16 DIAGNOSIS — Z7901 Long term (current) use of anticoagulants: Secondary | ICD-10-CM | POA: Diagnosis not present

## 2021-05-16 DIAGNOSIS — I4819 Other persistent atrial fibrillation: Secondary | ICD-10-CM | POA: Diagnosis not present

## 2021-05-16 DIAGNOSIS — N1832 Chronic kidney disease, stage 3b: Secondary | ICD-10-CM | POA: Diagnosis not present

## 2021-05-16 DIAGNOSIS — N179 Acute kidney failure, unspecified: Secondary | ICD-10-CM | POA: Diagnosis not present

## 2021-05-16 DIAGNOSIS — Z8679 Personal history of other diseases of the circulatory system: Secondary | ICD-10-CM

## 2021-05-16 DIAGNOSIS — I5033 Acute on chronic diastolic (congestive) heart failure: Secondary | ICD-10-CM | POA: Diagnosis not present

## 2021-05-16 DIAGNOSIS — E162 Hypoglycemia, unspecified: Secondary | ICD-10-CM | POA: Diagnosis not present

## 2021-05-16 HISTORY — PX: CARDIOVERSION: SHX1299

## 2021-05-16 LAB — BASIC METABOLIC PANEL
Anion gap: 10 (ref 5–15)
BUN: 53 mg/dL — ABNORMAL HIGH (ref 8–23)
CO2: 31 mmol/L (ref 22–32)
Calcium: 8.3 mg/dL — ABNORMAL LOW (ref 8.9–10.3)
Chloride: 100 mmol/L (ref 98–111)
Creatinine, Ser: 2.55 mg/dL — ABNORMAL HIGH (ref 0.61–1.24)
GFR, Estimated: 24 mL/min — ABNORMAL LOW (ref >60)
Glucose, Bld: 99 mg/dL (ref 70–99)
Potassium: 3.7 mmol/L (ref 3.5–5.1)
Sodium: 141 mmol/L (ref 135–145)

## 2021-05-16 LAB — GLUCOSE, CAPILLARY
Glucose-Capillary: 120 mg/dL — ABNORMAL HIGH (ref 70–99)
Glucose-Capillary: 124 mg/dL — ABNORMAL HIGH (ref 70–99)
Glucose-Capillary: 153 mg/dL — ABNORMAL HIGH (ref 70–99)
Glucose-Capillary: 157 mg/dL — ABNORMAL HIGH (ref 70–99)
Glucose-Capillary: 174 mg/dL — ABNORMAL HIGH (ref 70–99)
Glucose-Capillary: 184 mg/dL — ABNORMAL HIGH (ref 70–99)
Glucose-Capillary: 238 mg/dL — ABNORMAL HIGH (ref 70–99)
Glucose-Capillary: 58 mg/dL — ABNORMAL LOW (ref 70–99)
Glucose-Capillary: 99 mg/dL (ref 70–99)

## 2021-05-16 LAB — PROTIME-INR
INR: 2.1 — ABNORMAL HIGH (ref 0.8–1.2)
Prothrombin Time: 23.7 seconds — ABNORMAL HIGH (ref 11.4–15.2)

## 2021-05-16 SURGERY — CARDIOVERSION
Anesthesia: General

## 2021-05-16 MED ORDER — LOPERAMIDE HCL 2 MG PO CAPS
4.0000 mg | ORAL_CAPSULE | Freq: Once | ORAL | Status: AC
  Administered 2021-05-16: 4 mg via ORAL
  Filled 2021-05-16: qty 2

## 2021-05-16 MED ORDER — DEXTROSE 50 % IV SOLN
12.5000 g | INTRAVENOUS | Status: AC
  Administered 2021-05-16: 12.5 g via INTRAVENOUS

## 2021-05-16 MED ORDER — MELATONIN 5 MG PO TABS
2.5000 mg | ORAL_TABLET | Freq: Every evening | ORAL | Status: DC | PRN
  Administered 2021-05-16 – 2021-05-28 (×9): 2.5 mg via ORAL
  Filled 2021-05-16 (×10): qty 1

## 2021-05-16 MED ORDER — INSULIN ASPART 100 UNIT/ML IJ SOLN
0.0000 [IU] | Freq: Three times a day (TID) | INTRAMUSCULAR | Status: DC
  Administered 2021-05-16 – 2021-05-17 (×2): 2 [IU] via SUBCUTANEOUS
  Administered 2021-05-18: 1 [IU] via SUBCUTANEOUS
  Administered 2021-05-18 – 2021-05-21 (×5): 2 [IU] via SUBCUTANEOUS
  Administered 2021-05-21: 3 [IU] via SUBCUTANEOUS
  Administered 2021-05-22 – 2021-05-23 (×3): 2 [IU] via SUBCUTANEOUS
  Administered 2021-05-23 – 2021-05-24 (×3): 3 [IU] via SUBCUTANEOUS
  Administered 2021-05-25 (×2): 2 [IU] via SUBCUTANEOUS
  Filled 2021-05-16 (×18): qty 1

## 2021-05-16 MED ORDER — MELATONIN 5 MG PO TABS: 2.5000 mg | ORAL_TABLET | Freq: Every evening | ORAL | Status: DC | PRN

## 2021-05-16 MED ORDER — DEXTROSE 50 % IV SOLN
INTRAVENOUS | Status: AC
  Filled 2021-05-16: qty 50

## 2021-05-16 MED ORDER — LOPERAMIDE HCL 2 MG PO CAPS: 2.0000 mg | ORAL_CAPSULE | Freq: Four times a day (QID) | ORAL | Status: DC | PRN

## 2021-05-16 NOTE — Progress Notes (Signed)
Informed Nurse Practitioner: Jaci Carrel, NP about patient's blood sugar. Informed to recheck after an hour. Will continue to monitor ?

## 2021-05-16 NOTE — Progress Notes (Signed)
Informed Nurse Coralyn Mark, RN) about the patient's sugar in report. States she will recheck prior to procedure. ?

## 2021-05-16 NOTE — Progress Notes (Signed)
Patient's blood sugar at 3:30 am was 58. Patient is currently NPO. STAT order placed and patient administered IV Dextrose and blood sugar rechecked. ? ?Blood sugar at 4 am was 124. Patient remains alert and oriented. Patient is communicative, calm and cooperative. No complains of dizziness or pain. ? ?Blood sugar measured an hour later at 5:00 am was 99.  ? ?Will contact Nurse Practitioner Jaci Carrel (NP) regarding the patient's glucose checks ?

## 2021-05-16 NOTE — Progress Notes (Signed)
Patient is NPO

## 2021-05-16 NOTE — Progress Notes (Addendum)
? ? ? ?Progress Note  ? ? ?Timothy Riley  HCW:[id number] DOB: 1940-09-29  DOA: 05/13/2021 ?PCP: Derinda Late, MD  ? ? ? ? ?Brief Narrative:  ? ? ?Medical records reviewed and are as summarized below: ? ?Timothy Riley is a 81 y.o. male with medical history significant for Persistent A-fib with prior cardioversion, on Eliquis, s/p bioprosthetic AVR, G2 DD (EF 50 to 55% 08/2020), diabetes, nonobstructive CAD with low risk Myoview in 2020, HTN, CKD 3B,hospitalized from 2/8 to 03/15/2021 with acute gastroenteritis and CHF exacerbation  complicated by AKI related to diuretic therapy.  He was last seen by cardiologist, Dr. Rockey Situ on 05/09/2021 and review of the note reveals that the plan was to arrange cardioversion to address A-fib.  His Coreg was also increased. ?He presented to the hospital because of shortness of breath, abdominal distention bilateral lower extremity swelling. ? ? ? ? ? ? ?Assessment/Plan:  ? ?Principal Problem: ?  Acute on chronic heart failure with preserved ejection fraction (HFpEF) ([place]) ?Active Problems: ?  Acute renal failure superimposed on stage 3b chronic kidney disease ([place]) ?  Atrial fibrillation ([place]) ?  Chronic anticoagulation ?  CAD (coronary artery disease) ?  H/O paroxysmal supraventricular tachycardia s/p ablation ?  Controlled type 2 diabetes mellitus without complication, without long-term current use of insulin ([place]) ?  Chronic anemia ?  Hypoglycemia ? ? ?Body mass index is 30.07 kg/m?. ? ? ?Acute on chronic systolic and diastolic diastolic CHF: Improved.  IV Lasix has been switched to torsemide.  Monitor daily weight, urine output and BMP.  2D echo showed EF estimated at 30 to 35%, moderate MR, TR.  ? 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  Creatinine is stable. ? ?Hypokalemia: Improved ? ?History of paroxysmal SVT, persistent atrial fibrillation: He has been started on amiodarone.  Continue carvedilol and Eliquis.  Plan for cardioversion tomorrow.   Follow-up with cardiologist. ? ?Type II DM, hypoglycemic episodes: Decreased dose of sliding scale  NovoLog.  Home glipizide and pioglitazone have been held since admission. ? ?Other comorbidities include chronic anemia, CAD ? ? ?Diet Order   ? ?       ?  Diet heart healthy/carb modified Room service appropriate? Yes; Fluid consistency: Thin  Diet effective now       ?  ? ?  ?  ? ?  ? ? ? ? ? ? ? ? ? ? ? ?Consultants: ?Cardiologist ? ?Procedures: ?None ? ? ? ?Medications:  ? ? amiodarone  400 mg Oral BID  ? apixaban  2.5 mg Oral BID  ? carvedilol  12.5 mg Oral BID WC  ? dextrose      ? insulin aspart  0-9 Units Subcutaneous TID WC  ? potassium chloride SA  20 mEq Oral BID  ? rosuvastatin  10 mg Oral Daily  ? torsemide  40 mg Oral BID  ? ?Continuous Infusions: ? sodium chloride 20 mL/hr at 05/16/21 1211  ? ? ? ?Anti-infectives (From admission, onward)  ? ? None  ? ?  ? ? ? ? ? ? ? ? ? ?Family Communication/Anticipated D/C date and plan/Code Status  ? ?DVT prophylaxis: apixaban (ELIQUIS) tablet 2.5 mg Start: 05/14/21 0000 ?apixaban (ELIQUIS) tablet 2.5 mg  ? ?  Code Status: Full Code ? ?Family Communication: None ?Disposition Plan: Plan to discharge home tomorrow ? ? ?Status is: Inpatient ?Remains inpatient appropriate because: Plan for cardioversion tomorrow ? ? ? ? ? ? ?Subjective:  ? ?Interval events  noted.  Blood sugar dropped to 58 overnight.  No shortness of breath or chest pain.  Leg swelling and abdominal swelling home  improved. ? ?Objective:  ? ? ?Vitals:  ? 05/16/21 0530 05/16/21 0530 05/16/21 0752 05/16/21 1203  ?BP: 123/82 123/82 122/89 91/61  ?Pulse: 93 96 (!) 103 100  ?Resp: '18 18 20 20  '$ ?Temp: (!) 97.5 ?F (36.4 ?C) (!) 97.5 ?F (36.4 ?C) (!) 97.5 ?F (36.4 ?C) (!) 97.4 ?F (36.3 ?C)  ?TempSrc:      ?SpO2:  97% 97% 97%  ?Weight:      ?Height:      ? ?No data found. ? ? ?Intake/Output Summary (Last 24 hours) at 05/16/2021 1252 ?Last data filed at 05/16/2021 1211 ?Gross per 24 hour  ?Intake 478.35 ml  ?Output  --  ?Net 478.35 ml  ? ?Filed Weights  ? 05/14/21 0146 05/15/21 0500 05/16/21 0500  ?Weight: 88.3 kg 89.3 kg 89.7 kg  ? ? ?Exam: ? ?GEN: NAD ?SKIN: Warm and dry ?EYES: No pallor or icterus ?ENT: MMM ?CV: RRR ?PULM: CTA B ?ABD: soft, abdominal distention is improving, NT, +BS ?CNS: AAO x 3, non focal ?EXT: Mild pedal edema.  No erythema or tenderness ? ? ? ?Data Reviewed:  ? ?I have personally reviewed following labs and imaging studies: ? ?Labs: ?Labs show the following:  ? ?Basic Metabolic Panel: ?Recent Labs  ?Lab 05/13/21 ?2037 05/14/21 ?1478 05/15/21 ?2956 05/16/21 ?2130  ?NA 142 143 144 141  ?K 4.0 3.4* 3.6 3.7  ?CL 101 103 102 100  ?CO2 30 31 32 31  ?GLUCOSE 76 127* 100* 99  ?BUN 55* 52* 51* 53*  ?CREATININE 2.65* 2.60* 2.46* 2.55*  ?CALCIUM 8.6* 8.5* 8.5* 8.3*  ?MG  --  2.3 2.3  --   ? ?GFR ?Estimated Creatinine Clearance: 24.3 mL/min (A) (by C-G formula based on SCr of 2.55 mg/dL (H)). ?Liver Function Tests: ?No results for input(s): AST, ALT, ALKPHOS, BILITOT, PROT, ALBUMIN in the last 168 hours. ?No results for input(s): LIPASE, AMYLASE in the last 168 hours. ?No results for input(s): AMMONIA in the last 168 hours. ?Coagulation profile ?Recent Labs  ?Lab 05/16/21 ?0023  ?INR 2.1*  ? ? ?CBC: ?Recent Labs  ?Lab 05/13/21 ?2037  ?WBC 5.5  ?HGB 10.7*  ?HCT 34.3*  ?MCV 89.3  ?PLT 161  ? ?Cardiac Enzymes: ?No results for input(s): CKTOTAL, CKMB, CKMBINDEX, TROPONINI in the last 168 hours. ?BNP (last 3 results) ?No results for input(s): PROBNP in the last 8760 hours. ?CBG: ?Recent Labs  ?Lab 05/16/21 ?0318 05/16/21 ?0405 05/16/21 ?8657 05/16/21 ?0751 05/16/21 ?1206  ?GLUCAP 58* 124* 99 120* 238*  ? ?D-Dimer: ?No results for input(s): DDIMER in the last 72 hours. ?Hgb A1c: ?No results for input(s): HGBA1C in the last 72 hours. ?Lipid Profile: ?No results for input(s): CHOL, HDL, LDLCALC, TRIG, CHOLHDL, LDLDIRECT in the last 72 hours. ?Thyroid function studies: ?Recent Labs  ?  05/15/21 ?0605  ?TSH 5.107*  ? ?Anemia  work up: ?No results for input(s): VITAMINB12, FOLATE, FERRITIN, TIBC, IRON, RETICCTPCT in the last 72 hours. ?Sepsis Labs: ?Recent Labs  ?Lab 05/13/21 ?2037  ?WBC 5.5  ? ? ?Microbiology ?No results found for this or any previous visit (from the past 240 hour(s)). ? ?Procedures and diagnostic studies: ? ?No results found. ? ? ? ? ? ? ? ? ? ? ? ? LOS: 3 days  ? ?Jassmine Vandruff  ?[facility]  ? ?Pager on [email or web address]. If 7PM-7AM, please contact night-coverage at  [email or web address] ? ? ? ? ?05/16/2021, 12:52 PM  ? ? ? ? ? ? ? ? ? ?

## 2021-05-16 NOTE — Anesthesia Preprocedure Evaluation (Deleted)
Anesthesia Evaluation  ? ? ?Airway ? ? ? ? ? ? ? Dental ?  ?Pulmonary ?former smoker (quit 1974),  ?  ? ? ? ? ? ? ? Cardiovascular ?hypertension, + CAD (nonobstructive), + Peripheral Vascular Disease and +CHF (acute on chronic)  ?+ dysrhythmias (a fib on Eliquis) + Valvular Problems/Murmurs (s/p AVR 2016)  ? ?ECG 05/13/21:  ?Atrial fibrillation ?Right bundle branch block ? ?Myocardial perfusion 03/18/18:  ?- Normal pharmacologic myocardial perfusion stress test without ischemia or scar. ?- The left ventricular ejection fraction is normal by visual estimation and Siemens calculation (59%). LVEF by QGS is likely reduced due to gating artifact (42%). ?- This is a low risk study. ?  ?Neuro/Psych ?  ? GI/Hepatic ?  ?Endo/Other  ?diabetes, Type 2 ? Renal/GU ?Renal disease (stage III CKD)  ? ?  ?Musculoskeletal ? ?(+) Arthritis ,  ? Abdominal ?  ?Peds ? Hematology ? ?(+) Blood dyscrasia, anemia ,   ?Anesthesia Other Findings ?Dr. Tyrell Antonio note 05/15/21:  ?I have seen and examined the patient. I agree with the above note with the addition of : Still in atrial fibrillation with controlled rate.  He reports significant improvement in volume overload. ?? ?By physical exam, heart is irregularly irregular with no murmurs.  Jugular venous pressure is mildly elevated, lungs are clear to auscultation with mild bilateral leg edema. ?? ?Recommendations: ?Acute on chronic systolic/diastolic heart failure: Ejection fraction this admission was 30 to 35% and previously was 50 to 55%.  Suspect that heart failure was triggered by atrial fibrillation.  The patient improved significantly with IV furosemide and thus we will go ahead and switch him to oral torsemide 40 mg twice daily.  He does have underlying chronic kidney disease.  He might not benefit from an SGLT2 inhibitor given advanced chronic kidney disease. ?? ?Persistent atrial fibrillation: Suspect that this is significantly contributing to heart  failure.  Given drop in his ejection fraction as well as moderately dilated left atrium, I doubt that he will maintain in sinus rhythm with cardioversion alone without an antiarrhythmic medication.  Due to heart failure, antiarrhythmic medications are limited to amiodarone and dofetilide.  Dofetilide is not a good option in the setting of advanced chronic kidney disease.  Thus, I will go ahead and start him on oral amiodarone 400 mg twice daily.  I do think we have to wait at least a day before proceeding with cardioversion which tentatively can likely be planned to be done on Wednesday.  He has not missed any dose of Eliquis. ?? ?The patient is status post aortic valve replacement and the valve was functioning appropriately on most recent echocardiogram. ? Reproductive/Obstetrics ? ?  ? ? ? ? ? ? ? ? ? ? ? ? ? ?  ?  ? ? ? ? ? ? ? ? ?Anesthesia Physical ?Anesthesia Plan ? ?ASA: 3 ? ?Anesthesia Plan: General  ? ?Post-op Pain Management:   ? ?Induction: Intravenous ? ?PONV Risk Score and Plan: 2 and Propofol infusion, TIVA and Treatment may vary due to age or medical condition ? ?Airway Management Planned: Natural Airway ? ?Additional Equipment:  ? ?Intra-op Plan:  ? ?Post-operative Plan:  ? ?Informed Consent: I have reviewed the patients History and Physical, chart, labs and discussed the procedure including the risks, benefits and alternatives for the proposed anesthesia with the patient or authorized representative who has indicated his/her understanding and acceptance.  ? ? ? ? ? ?Plan Discussed with: CRNA ? ?Anesthesia Plan Comments: (LMA/GETA backup  discussed.  Patient consented for risks of anesthesia including but not limited to:  ?- adverse reactions to medications ?- damage to eyes, teeth, lips or other oral mucosa ?- nerve damage due to positioning  ?- sore throat or hoarseness ?- damage to heart, brain, nerves, lungs, other parts of body or loss of life ? ?Informed patient about role of CRNA in peri- and  intra-operative care.  Patient voiced understanding.)  ? ? ? ? ? ? ?Anesthesia Quick Evaluation ? ?

## 2021-05-16 NOTE — Progress Notes (Signed)
Checked in on patient around 3 am and patient explains that he "ate a spoon of peanut butter". Patient was kept NPO the night before and the shift nurse explained the reason and requirements for his procedure. Patient acknowledged understanding. However, patient states "I was just hungry." ? ?Will inform oncoming nurse in shift report.  ? ? ?

## 2021-05-16 NOTE — Progress Notes (Signed)
Coralyn Mark (RN), called for report prior to Cardioversion. Cardioversion scheduled for today. [facility] has just taken the patient off the unit for his procedure.  ? ?Patient is stable, alert and oriented. He has not completed the informed consent because he has more questions for the doctor. Will complete the informed consent prior to procedure. ?

## 2021-05-16 NOTE — Progress Notes (Signed)
? ?Progress Note ? ?Patient Name: Timothy Riley ?Date of Encounter: 05/16/2021 ? ?[facility] Cardiologist: Ida Rogue, MD  ? ?Subjective  ? ?Frustrated by confusion surrounding timing of cardioversion (patient under the impression it is to be done tomorrow).  He denies chest pain, shortness of breath, palpitations, and edema. ? ?Inpatient Medications  ?  ?Scheduled Meds: ? amiodarone  400 mg Oral BID  ? apixaban  2.5 mg Oral BID  ? carvedilol  12.5 mg Oral BID WC  ? dextrose      ? insulin aspart  0-15 Units Subcutaneous Q4H  ? potassium chloride SA  20 mEq Oral BID  ? rosuvastatin  10 mg Oral Daily  ? torsemide  40 mg Oral BID  ? ?Continuous Infusions: ? sodium chloride 20 mL/hr at 05/16/21 0408  ? ?PRN Meds: ?acetaminophen **OR** acetaminophen, ondansetron **OR** ondansetron (ZOFRAN) IV  ? ?Vital Signs  ?  ?Vitals:  ? 05/16/21 0001 05/16/21 0500 05/16/21 0530 05/16/21 0530  ?BP: 138/84 129/87 123/82 123/82  ?Pulse: 98 97 93 96  ?Resp: '19 19 18 18  '$ ?Temp: 98.1 ?F (36.7 ?C) 98.2 ?F (36.8 ?C) (!) 97.5 ?F (36.4 ?C) (!) 97.5 ?F (36.4 ?C)  ?TempSrc: Oral Oral    ?SpO2:  98%  97%  ?Weight:  89.7 kg    ?Height:      ? ? ?Intake/Output Summary (Last 24 hours) at 05/16/2021 0733 ?Last data filed at 05/16/2021 0408 ?Gross per 24 hour  ?Intake 317.38 ml  ?Output 400 ml  ?Net -82.62 ml  ? ? ?  05/16/2021  ?  5:00 AM 05/15/2021  ?  5:00 AM 05/14/2021  ?  1:46 AM  ?Last 3 Weights  ?Weight (lbs) 197 lb 12 oz 196 lb 13.9 oz 194 lb 10.7 oz  ?Weight (kg) 89.7 kg 89.3 kg 88.3 kg  ?   ? ?Telemetry  ?  ?Atrial fibrillation (ventricular rates 90-125 bpm; mostly 90-110 bpm) - Personally Reviewed ? ?ECG  ?  ?No new tracing. ?Physical Exam  ? ?GEN: No acute distress.   ?Neck: No JVD ?Cardiac: Irregularly irregular rhythm w/o murmurs. ?Respiratory: Clear to auscultation bilaterally. ?GI: Soft, nontender, non-distended  ?MS: No edema; No deformity. ?Neuro:  Nonfocal  ?Psych: Normal affect  ? ?Labs  ?  ?High Sensitivity Troponin:    ?Recent Labs  ?Lab 05/13/21 ?2037 05/14/21 ?2979  ?TROPONINIHS 20* 19*  ?   ?Chemistry ?Recent Labs  ?Lab 05/14/21 ?8921 05/15/21 ?1941 05/16/21 ?7408  ?NA 143 144 141  ?K 3.4* 3.6 3.7  ?CL 103 102 100  ?CO2 31 32 31  ?GLUCOSE 127* 100* 99  ?BUN 52* 51* 53*  ?CREATININE 2.60* 2.46* 2.55*  ?CALCIUM 8.5* 8.5* 8.3*  ?MG 2.3 2.3  --   ?GFRNONAA 24* 26* 24*  ?ANIONGAP '9 10 10  '$ ?  ?Lipids No results for input(s): CHOL, TRIG, HDL, LABVLDL, LDLCALC, CHOLHDL in the last 168 hours.  ?Hematology ?Recent Labs  ?Lab 05/09/21 ?1135 05/13/21 ?2037  ?WBC 5.8 5.5  ?RBC 3.74* 3.84*  ?HGB 10.5* 10.7*  ?HCT 32.8* 34.3*  ?MCV 88 89.3  ?MCH 28.1 27.9  ?MCHC 32.0 31.2  ?RDW 15.1 16.0*  ?PLT 157 161  ? ?Thyroid  ?Recent Labs  ?Lab 05/15/21 ?1448  ?TSH 5.107*  ?  ?BNP ?Recent Labs  ?Lab 05/13/21 ?2034  ?BNP 1,597.6*  ?  ?DDimer No results for input(s): DDIMER in the last 168 hours.  ? ?Radiology  ?  ?ECHOCARDIOGRAM COMPLETE ? ?Result Date: 05/14/2021 ?  ECHOCARDIOGRAM REPORT   Patient Name:   Timothy Riley [facility] Date of Exam: 05/14/2021 Medical Rec #:  [id number]       Height:       68.0 in Accession #:    [id number]      Weight:       194.7 lb Date of Birth:  [birth year]      BSA:          2.020 m? Patient Age:    81 years        BP:           109/77 mmHg Patient Gender: M               HR:           86 bpm. Exam Location:  [facility] Procedure: 2D Echo Indications:     CHF I50.31  History:         Patient has no prior history of Echocardiogram examinations.  Sonographer:     Kathlen Brunswick RDCS Referring Phys:  [id number] Athena Masse Diagnosing Phys: Ida Rogue MD IMPRESSIONS  1. Left ventricular ejection fraction, by estimation, is 30 to 35%. The left ventricle has moderately decreased function. The left ventricle demonstrates global hypokinesis. The left ventricular internal cavity size was moderately dilated. Left ventricular diastolic parameters are indeterminate.  2. Right ventricular systolic function is moderately reduced. The right  ventricular size is moderately enlarged. There is moderately elevated pulmonary artery systolic pressure. The estimated right ventricular systolic pressure is 56.3 mmHg.  3. Left atrial size was moderately dilated.  4. The mitral valve is normal in structure. Moderate mitral valve regurgitation. No evidence of mitral stenosis.  5. Tricuspid valve regurgitation is moderate.  6. The aortic valve has been repaired/replaced. 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 Aortic valve regurgitation is not visualized. No aortic stenosis is present.  7. The inferior vena cava is dilated in size with <50% respiratory variability, suggesting right atrial pressure of 15 mmHg. FINDINGS  Left Ventricle: Left ventricular ejection fraction, by estimation, is 30 to 35%. The left ventricle has moderately decreased function. The left ventricle demonstrates global hypokinesis. The left ventricular internal cavity size was moderately dilated. There is no left ventricular hypertrophy. Left ventricular diastolic parameters are indeterminate. Right Ventricle: The right ventricular size is moderately enlarged. No increase in right ventricular wall thickness. Right ventricular systolic function is moderately reduced. There is moderately elevated pulmonary artery systolic pressure. The tricuspid  regurgitant velocity is 2.74 m/s, and with an assumed right atrial pressure of 15 mmHg, the estimated right ventricular systolic pressure is 87.5 mmHg. Left Atrium: Left atrial size was moderately dilated. Right Atrium: Right atrial size was normal in size. Pericardium: There is no evidence of pericardial effusion. Mitral Valve: The mitral valve is normal in structure. Moderate mitral valve regurgitation. No evidence of mitral valve stenosis. Tricuspid Valve: The tricuspid valve is normal in structure. Tricuspid valve regurgitation is moderate . No evidence of tricuspid stenosis. Aortic Valve: The aortic valve has been  repaired/replaced. Aortic valve regurgitation is not visualized. No aortic stenosis is present. Aortic valve peak gradient measures 10.1 mmHg. Pulmonic Valve: The pulmonic valve was normal in structure. Pulmonic valve regurgitation is not visualized. No evidence of pulmonic stenosis. Aorta: The aortic root is normal in size and structure. Venous: The inferior vena cava is dilated in size with less than 50% respiratory variability, suggesting right atrial pressure of 15 mmHg. IAS/Shunts: No atrial level  shunt detected by color flow Doppler.  LEFT VENTRICLE PLAX 2D LVIDd:         6.16 cm      Diastology LVIDs:         5.04 cm      LV e' medial:    4.46 cm/s LV PW:         1.17 cm      LV E/e' medial:  23.5 LV IVS:        1.08 cm      LV e' lateral:   6.53 cm/s LVOT diam:     1.80 cm      LV E/e' lateral: 16.1 LV SV:         23 LV SV Index:   12 LVOT Area:     2.54 cm?  LV Volumes (MOD) LV vol d, MOD A2C: 157.0 ml LV vol d, MOD A4C: 138.0 ml LV vol s, MOD A2C: 117.0 ml LV vol s, MOD A4C: 89.9 ml LV SV MOD A2C:     40.0 ml LV SV MOD A4C:     138.0 ml LV SV MOD BP:      48.3 ml RIGHT VENTRICLE RV Basal diam:  4.60 cm RV S prime:     7.40 cm/s TAPSE (M-mode): 1.2 cm LEFT ATRIUM             Index        RIGHT ATRIUM           Index LA diam:        5.10 cm 2.52 cm/m?   RA Area:     20.30 cm? LA Vol (A2C):   90.7 ml 44.89 ml/m?  RA Volume:   59.10 ml  29.25 ml/m? LA Vol (A4C):   61.3 ml 30.34 ml/m? LA Biplane Vol: 78.8 ml 39.00 ml/m?  AORTIC VALVE                 PULMONIC VALVE AV Area (Vmax): 0.94 cm?     PV Vmax:          0.50 m/s AV Vmax:        159.00 cm/s  PV Peak grad:     1.0 mmHg AV Peak Grad:   10.1 mmHg    PR Tadarius Maland Diast Vel: 9.24 msec LVOT Vmax:      58.70 cm/s LVOT Vmean:     40.000 cm/s LVOT VTI:       0.092 m  AORTA Ao Root diam: 2.80 cm MITRAL VALVE                TRICUSPID VALVE MV Area (PHT): 4.17 cm?     TV Peak grad:   23.3 mmHg MV Decel Time: 182 msec     TV Vmax:        2.41 m/s MR Peak grad: 73.6 mmHg      TR Peak grad:   30.0 mmHg MR Mean grad: 43.0 mmHg     TR Vmax:        274.00 cm/s MR Vmax:      429.00 cm/s MR Vmean:     302.0 cm/s    SHUNTS MV E velocity: 105.00 cm/s  Systemic VTI:  0.09 m

## 2021-05-16 NOTE — Progress Notes (Signed)
Inpatient Diabetes Program Recommendations ? ?AACE/ADA: New Consensus Statement on Inpatient Glycemic Control (2015) ? ?Target Ranges:  Prepandial:   less than 140 mg/dL ?     Peak postprandial:   less than 180 mg/dL (1-2 hours) ?     Critically ill patients:  140 - 180 mg/dL  ? ? Latest Reference Range & Units 05/16/21 03:18 05/16/21 04:05 05/16/21 05:08 05/16/21 07:51  ?Glucose-Capillary 70 - 99 mg/dL 58 (L) 124 (H) 99 120 (H)  ? ? Latest Reference Range & Units 05/15/21 08:31 05/15/21 12:26 05/15/21 16:26 05/15/21 21:16 05/15/21 23:17  ?Glucose-Capillary 70 - 99 mg/dL 98 193 (H) 94 156 (H) 140 (H)  ? ?Review of Glycemic Control ? ?Diabetes history: DM2 ?Outpatient Diabetes medications: Glipizide 10 mg QAM, Actos 30 mg daily ?Current orders for Inpatient glycemic control: Novolog 0-15 units Q4H ? ?Inpatient Diabetes Program Recommendations:   ? ?Insulin: Glucose 58 mg/dl at 3:18 am today. Please consider decreasing Novolog correction to 0-9 units TID with meals. ? ?Outpatient DM medications: Patient has hx of CHF; therefore, would recommend discontinuing Actos outpatient due to side effect of fluid retention. ? ?Thanks, ?Barnie Alderman, RN, MSN, CDE ?Diabetes Coordinator ?Inpatient Diabetes Program ?[phone] (Team Pager from 8am to 5pm) ? ? ? ? ?

## 2021-05-16 NOTE — Progress Notes (Signed)
IV Dextrose administered with Charge Nurse, Edwin Dada, RN. ?

## 2021-05-16 NOTE — Care Management Important Message (Signed)
Important Message ? ?Patient Details  ?Name: Timothy Riley [facility] ?MRN: [id number] ?Date of Birth: 1940/05/27 ? ? ?Medicare Important Message Given:  Yes ? ? ? ? ?[name] ?05/16/2021, 11:06 AM ?

## 2021-05-17 ENCOUNTER — Inpatient Hospital Stay: Payer: Medicare Other | Admitting: Anesthesiology

## 2021-05-17 ENCOUNTER — Encounter: Payer: Self-pay | Admitting: Internal Medicine

## 2021-05-17 ENCOUNTER — Encounter
Admission: EM | Disposition: A | Discharge status: Home Health | Payer: Self-pay | Source: Home / Self Care | Attending: Internal Medicine

## 2021-05-17 DIAGNOSIS — N179 Acute kidney failure, unspecified: Secondary | ICD-10-CM | POA: Diagnosis not present

## 2021-05-17 DIAGNOSIS — I5033 Acute on chronic diastolic (congestive) heart failure: Secondary | ICD-10-CM | POA: Diagnosis not present

## 2021-05-17 DIAGNOSIS — N1832 Chronic kidney disease, stage 3b: Secondary | ICD-10-CM | POA: Diagnosis not present

## 2021-05-17 DIAGNOSIS — I4819 Other persistent atrial fibrillation: Secondary | ICD-10-CM | POA: Diagnosis not present

## 2021-05-17 HISTORY — PX: CARDIOVERSION: SHX1299

## 2021-05-17 LAB — BASIC METABOLIC PANEL
Anion gap: 11 (ref 5–15)
BUN: 60 mg/dL — ABNORMAL HIGH (ref 8–23)
CO2: 29 mmol/L (ref 22–32)
Calcium: 8.6 mg/dL — ABNORMAL LOW (ref 8.9–10.3)
Chloride: 100 mmol/L (ref 98–111)
Creatinine, Ser: 3.23 mg/dL — ABNORMAL HIGH (ref 0.61–1.24)
GFR, Estimated: 18 mL/min — ABNORMAL LOW (ref >60)
Glucose, Bld: 152 mg/dL — ABNORMAL HIGH (ref 70–99)
Potassium: 4.5 mmol/L (ref 3.5–5.1)
Sodium: 140 mmol/L (ref 135–145)

## 2021-05-17 LAB — GLUCOSE, CAPILLARY
Glucose-Capillary: 155 mg/dL — ABNORMAL HIGH (ref 70–99)
Glucose-Capillary: 138 mg/dL — ABNORMAL HIGH (ref 70–99)
Glucose-Capillary: 132 mg/dL — ABNORMAL HIGH (ref 70–99)
Glucose-Capillary: 185 mg/dL — ABNORMAL HIGH (ref 70–99)
Glucose-Capillary: 147 mg/dL — ABNORMAL HIGH (ref 70–99)
Glucose-Capillary: 167 mg/dL — ABNORMAL HIGH (ref 70–99)
Glucose-Capillary: 176 mg/dL — ABNORMAL HIGH (ref 70–99)
Glucose-Capillary: 157 mg/dL — ABNORMAL HIGH (ref 70–99)

## 2021-05-17 SURGERY — CARDIOVERSION
Anesthesia: General

## 2021-05-17 MED ORDER — TORSEMIDE 20 MG PO TABS
40.0000 mg | ORAL_TABLET | Freq: Every day | ORAL | Status: DC
  Administered 2021-05-18: 40 mg via ORAL
  Filled 2021-05-17: qty 2

## 2021-05-17 MED ORDER — SODIUM CHLORIDE 0.9 % IV SOLN: INTRAVENOUS | Status: DC

## 2021-05-17 MED ORDER — PROPOFOL 10 MG/ML IV BOLUS
INTRAVENOUS | Status: DC | PRN
  Administered 2021-05-17: 50 mg via INTRAVENOUS

## 2021-05-17 NOTE — Procedures (Signed)
Cardioversion procedure note ?For atrial fibrillation. ? ?Procedure Details: ? ?Consent: Risks of procedure as well as the alternatives and risks of each were explained to the (patient/caregiver).  Consent for procedure obtained. ? ?Time Out: Verified patient identification, verified procedure, site/side was marked, verified correct patient position, special equipment/implants available, medications/allergies/relevent history reviewed, required imaging and test results available.  Performed ? ?Patient placed on cardiac monitor, pulse oximetry, supplemental oxygen as necessary.   ?Sedation given: propofol IV per anesthesia time ?Pacer pads placed anterior and posterior chest. ? ? ?Cardioverted 1 time(s).   ?Cardioverted at  200J. Synchronized biphasic ?Converted to NSR ? ? ?Evaluation: ?Findings: Post procedure EKG shows: NSR ?Complications: None ?Patient did tolerate procedure well. ? ?Time Spent Directly with the Patient: ? ?25 minutes  ? ?Kate Sable, M.D.  ?

## 2021-05-17 NOTE — Progress Notes (Signed)
? ?PROGRESS NOTE ? ? ? ?Timothy Riley  GYI:[id number] DOB: 1940/10/18 DOA: 05/13/2021 ?PCP: Derinda Late, MD  ? ?Assessment & Plan: ?  ?Principal Problem: ?  Acute on chronic heart failure with preserved ejection fraction (HFpEF) ([place]) ?Active Problems: ?  Acute renal failure superimposed on stage 3b chronic kidney disease ([place]) ?  Atrial fibrillation ([place]) ?  Chronic anticoagulation ?  CAD (coronary artery disease) ?  H/O paroxysmal supraventricular tachycardia s/p ablation ?  Controlled type 2 diabetes mellitus without complication, without long-term current use of insulin ([place]) ?  Chronic anemia ?  Hypoglycemia ? ? 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 Continue on torsemide. Monitor I/Os ?  ?AKI on CKD stage IIIb: possibly secondary to cardiorenal. Cr is trending up  ?  ?Hypokalemia: WNL today  ?  ?Hx of paroxysmal SVT: continue on coreg.  ? ?Persistent a. fib: continue on amiodarone, carvedilol, eliquis. Cardioversion today  ?  ?DM2: likely poorly controlled. Continue on SSI w/ accuchecks  ? ? ?DVT prophylaxis: eliquis  ?Code Status:  full  ?Family Communication: called pt's son, [name], no answer  ?Disposition Plan: depends on PT/OT recs  ? ?Level of care: Progressive ? ?Status is: Inpatient ?Remains inpatient appropriate because: cardioversion today  ? ? ? ?Consultants:  ?Cardio  ? ?Procedures:  ? ?Antimicrobials:  ? ? ?Subjective: ?Pt c/o not sleeping well overnight  ? ?Objective: ?Vitals:  ? 05/16/21 1647 05/16/21 1921 05/16/21 2339 05/17/21 0400  ?BP: 113/79 93/65 120/81 (!) 125/91  ?Pulse: 95 92 98 95  ?Resp: (!) '23 20 20 17  '$ ?Temp: (!) 97.5 ?F (36.4 ?C) 98.3 ?F (36.8 ?C) 97.7 ?F (36.5 ?C) 98.4 ?F (36.9 ?C)  ?TempSrc:    Oral  ?SpO2: 97% 99% 97% 98%  ?Weight:      ?Height:      ? ? ?Intake/Output Summary (Last 24 hours) at 05/17/2021 0725 ?Last data filed at 05/16/2021 1211 ?Gross per 24 hour  ?Intake 160.97 ml  ?Output --  ?Net 160.97 ml   ? ?Filed Weights  ? 05/14/21 0146 05/15/21 0500 05/16/21 0500  ?Weight: 88.3 kg 89.3 kg 89.7 kg  ? ? ?Examination: ? ?General exam: Appears calm and comfortable  ?Respiratory system: Clear to auscultation. Respiratory effort normal. ?Cardiovascular system: irregularly irregular. No rubs, gallops or clicks. ?Gastrointestinal system: Abdomen is nondistended, soft and nontender.  Normal bowel sounds heard. ?Central nervous system: Lethargic. Moves all extremities  ?Psychiatry: Judgement and insight appear abnormal. Flat mood anda affect ? ? ? ?Data Reviewed: I have personally reviewed following labs and imaging studies ? ?CBC: ?Recent Labs  ?Lab 05/13/21 ?2037  ?WBC 5.5  ?HGB 10.7*  ?HCT 34.3*  ?MCV 89.3  ?PLT 161  ? ?Basic Metabolic Panel: ?Recent Labs  ?Lab 05/13/21 ?2037 05/14/21 ?3500 05/15/21 ?9381 05/16/21 ?8299 05/17/21 ?3716  ?NA 142 143 144 141 140  ?K 4.0 3.4* 3.6 3.7 4.5  ?CL 101 103 102 100 100  ?CO2 30 31 32 31 29  ?GLUCOSE 76 127* 100* 99 152*  ?BUN 55* 52* 51* 53* 60*  ?CREATININE 2.65* 2.60* 2.46* 2.55* 3.23*  ?CALCIUM 8.6* 8.5* 8.5* 8.3* 8.6*  ?MG  --  2.3 2.3  --   --   ? ?GFR: ?Estimated Creatinine Clearance: 19.2 mL/min (A) (by C-G formula based on SCr of 3.23 mg/dL (H)). ?Liver Function Tests: ?No results for input(s): AST, ALT, ALKPHOS, BILITOT, PROT, ALBUMIN in the last 168 hours. ?No  results for input(s): LIPASE, AMYLASE in the last 168 hours. ?No results for input(s): AMMONIA in the last 168 hours. ?Coagulation Profile: ?Recent Labs  ?Lab 05/16/21 ?0023  ?INR 2.1*  ? ?Cardiac Enzymes: ?No results for input(s): CKTOTAL, CKMB, CKMBINDEX, TROPONINI in the last 168 hours. ?BNP (last 3 results) ?No results for input(s): PROBNP in the last 8760 hours. ?HbA1C: ?No results for input(s): HGBA1C in the last 72 hours. ?CBG: ?Recent Labs  ?Lab 05/16/21 ?1953 05/16/21 ?2209 05/16/21 ?2341 05/17/21 ?0202 05/17/21 ?0439  ?GLUCAP 153* 174* 157* 155* 138*  ? ?Lipid Profile: ?No results for input(s): CHOL, HDL,  LDLCALC, TRIG, CHOLHDL, LDLDIRECT in the last 72 hours. ?Thyroid Function Tests: ?Recent Labs  ?  05/15/21 ?0605  ?TSH 5.107*  ? ?Anemia Panel: ?No results for input(s): VITAMINB12, FOLATE, FERRITIN, TIBC, IRON, RETICCTPCT in the last 72 hours. ?Sepsis Labs: ?No results for input(s): PROCALCITON, LATICACIDVEN in the last 168 hours. ? ?No results found for this or any previous visit (from the past 240 hour(s)).  ? ? ? ? ? ?Radiology Studies: ?No results found. ? ? ? ? ? ?Scheduled Meds: ? amiodarone  400 mg Oral BID  ? apixaban  2.5 mg Oral BID  ? carvedilol  12.5 mg Oral BID WC  ? insulin aspart  0-9 Units Subcutaneous TID WC  ? potassium chloride SA  20 mEq Oral BID  ? rosuvastatin  10 mg Oral Daily  ? torsemide  40 mg Oral BID  ? ?Continuous Infusions: ? sodium chloride 20 mL/hr at 05/17/21 0423  ? ? ? LOS: 4 days  ? ? ?Time spent: 30 mins  ? ? ? ?Wyvonnia Dusky, MD ?[facility] ?Pager [phone] ? ?If 7PM-7AM, please contact night-coverage ?05/17/2021, 7:25 AM  ? ?

## 2021-05-17 NOTE — Evaluation (Signed)
Physical Therapy Evaluation ?Patient Details ?Name: Timothy Riley [facility] ?MRN: [id number] ?DOB: 1940-09-07 ?Today's Date: 05/17/2021 ? ?History of Present Illness ? Pt admitted for acute on chronic heart failure and is currently s/p cardioversion. Medical history significant for Persistent A-fib with prior cardioversion, on Eliquis, s/p bioprosthetic AVR, G2 DD (EF 50 to 55% 08/2020), diabetes, nonobstructive CAD with low risk Myoview in 2020, HTN, CKD 3B,hospitalized from 2/8 to 03/15/2021 with acute gastroenteritis and CHF exacerbation  complicated by AKI related to diuretic therapy who presents to the ED  with a complaint of shortness of breath, abdominal distention and bilateral lower extremity edema that has not been responding to outpatient therapy.  ?Clinical Impression ? Pt is a pleasant 81 year old male who was admitted for acute/chronic heart failure. Pt performs bed mobility with independence, transfers with mod I, and limited ambulation with independence. Pt reports heavy fatigue and general unwell feeling limiting this writer's ability to truly assess mobility components. Declined all ambulation at this time, received attempting to transfer and ambulate from recliner to bed. Pt demonstrates deficits with strength/balance/mobility. Does report history of falls and lives alone. Would benefit from skilled PT to address above deficits and promote optimal return to PLOF. Currently recommend OP PT. Pt currently unsure if he wants to pursue further therapy as he currently denies deficits. ?   ? ?Recommendations for follow up therapy are one component of a multi-disciplinary discharge planning process, led by the attending physician.  Recommendations may be updated based on patient status, additional functional criteria and insurance authorization. ? ?Follow Up Recommendations Outpatient PT ? ?  ?Assistance Recommended at Discharge PRN  ?Patient can return home with the following ?   ? ?  ?Equipment Recommendations   (TBD)  ?Recommendations for Other Services ?    ?  ?Functional Status Assessment Patient has had a recent decline in their functional status and demonstrates the ability to make significant improvements in function in a reasonable and predictable amount of time.  ? ?  ?Precautions / Restrictions Precautions ?Precautions: Fall ?Restrictions ?Weight Bearing Restrictions: No  ? ?  ? ?Mobility ? Bed Mobility ?Overal bed mobility: Independent ?  ?  ?  ?  ?  ?  ?General bed mobility comments: observed sit->supine, safe technique and pt demonstrates funtional reach to place bed linens over body. ?  ? ?Transfers ?Overall transfer level: Modified independent ?Equipment used: None ?  ?  ?  ?  ?  ?  ?  ?General transfer comment: safe technique ?  ? ?Ambulation/Gait ?Ambulation/Gait assistance: Independent ?Gait Distance (Feet): 3 Feet ?Assistive device: None ?Gait Pattern/deviations: Step-to pattern ?  ?  ?  ?General Gait Details: received standing in room. Only agreeable to walking back to bed and declines all further mobility at this time. ? ?Stairs ?  ?  ?  ?  ?  ? ?Wheelchair Mobility ?  ? ?Modified Rankin (Stroke Patients Only) ?  ? ?  ? ?Balance Overall balance assessment: Modified Independent, History of Falls (does a quick stand and needs to brace against bed with backs of legs, however no LOB noted) ?  ?  ?  ?  ?  ?  ?  ?  ?  ?  ?  ?  ?  ?  ?  ?  ?  ?  ?   ? ? ? ?Pertinent Vitals/Pain Pain Assessment ?Pain Assessment: No/denies pain  ? ? ?Home Living Family/patient expects to be discharged to:: Private residence ?Living Arrangements: Alone (  currently building home on son's property in order to live closer to him) ?Available Help at Discharge: Family;Available PRN/intermittently ?Type of Home: House ?Home Access: Stairs to enter ?  ?  ?  ?Home Layout: One level ?Home Equipment: Conservation officer, nature (2 wheels) ?   ?  ?Prior Function Prior Level of Function : Independent/Modified Independent ?  ?  ?  ?  ?  ?  ?Mobility  Comments: drives, reports one recent fall exting vehicle. REports he has AD available, however hasn't been reliant on them ?ADLs Comments: independent ?  ? ? ?Hand Dominance  ?   ? ?  ?Extremity/Trunk Assessment  ? Upper Extremity Assessment ?Upper Extremity Assessment: Overall WFL for tasks assessed ?  ? ?Lower Extremity Assessment ?Lower Extremity Assessment: Generalized weakness (B LE grossly 4/5) ?  ? ?   ?Communication  ? Communication: HOH  ?Cognition Arousal/Alertness: Awake/alert ?Behavior During Therapy: [facility] for tasks assessed/performed ?Overall Cognitive Status: Within Functional Limits for tasks assessed ?  ?  ?  ?  ?  ?  ?  ?  ?  ?  ?  ?  ?  ?  ?  ?  ?  ?  ?  ? ?  ?General Comments   ? ?  ?Exercises    ? ?Assessment/Plan  ?  ?PT Assessment Patient needs continued PT services  ?PT Problem List Decreased strength;Decreased activity tolerance;Decreased balance;Decreased mobility;Decreased safety awareness ? ?   ?  ?PT Treatment Interventions Gait training;DME instruction;Balance training   ? ?PT Goals (Current goals can be found in the Care Plan section)  ?Acute Rehab PT Goals ?Patient Stated Goal: to get his heart fixed ?PT Goal Formulation: With patient ?Time For Goal Achievement: 05/31/21 ?Potential to Achieve Goals: Good ? ?  ?Frequency Min 2X/week ?  ? ? ?Co-evaluation   ?  ?  ?  ?  ? ? ?  ?AM-PAC PT "6 Clicks" Mobility  ?Outcome Measure Help needed turning from your back to your side while in a flat bed without using bedrails?: None ?Help needed moving from lying on your back to sitting on the side of a flat bed without using bedrails?: None ?Help needed moving to and from a bed to a chair (including a wheelchair)?: None ?Help needed standing up from a chair using your arms (e.g., wheelchair or bedside chair)?: None ?Help needed to walk in hospital room?: A Little ?Help needed climbing 3-5 steps with a railing? : A Little ?6 Click Score: 22 ? ?  ?End of Session   ?Activity Tolerance: Patient tolerated  treatment well ?Patient left: in bed ?Nurse Communication: Mobility status ?PT Visit Diagnosis: Unsteadiness on feet (R26.81);Muscle weakness (generalized) (M62.81);Difficulty in walking, not elsewhere classified (R26.2) ?  ? ?Time: 6160-7371 ?PT Time Calculation (min) (ACUTE ONLY): 15 min ? ? ?Charges:   PT Evaluation ?$PT Eval Low Complexity: 1 Low ?  ?  ?   ? ? ?Greggory Stallion, PT, DPT, GCS ?[phone] ? ? ?Zanyiah Posten ?05/17/2021, 4:45 PM ? ?

## 2021-05-17 NOTE — Progress Notes (Signed)
? ?Progress Note ? ?Patient Name: Timothy Riley [facility] ?Date of Encounter: 05/17/2021 ? ?[facility] Cardiologist: Ida Rogue, MD  ? ?Subjective  ? ?No acute events overnight, denies chest pain or shortness of breath, no edema. ? ?Inpatient Medications  ?  ?Scheduled Meds: ? amiodarone  400 mg Oral BID  ? apixaban  2.5 mg Oral BID  ? carvedilol  12.5 mg Oral BID WC  ? insulin aspart  0-9 Units Subcutaneous TID WC  ? potassium chloride SA  20 mEq Oral BID  ? rosuvastatin  10 mg Oral Daily  ? torsemide  40 mg Oral BID  ? ?Continuous Infusions: ? sodium chloride 20 mL/hr at 05/17/21 0423  ? sodium chloride    ? ?PRN Meds: ?acetaminophen **OR** acetaminophen, loperamide, melatonin, ondansetron **OR** ondansetron (ZOFRAN) IV  ? ?Vital Signs  ?  ?Vitals:  ? 05/17/21 0354 05/17/21 6568 05/17/21 0813 05/17/21 0815  ?BP:  116/68  118/67  ?Pulse: 63 64 64 63  ?Resp: '19 15 13 14  '$ ?Temp:      ?TempSrc:      ?SpO2: 100% 100% 100% 100%  ?Weight:      ?Height:      ? ? ?Intake/Output Summary (Last 24 hours) at 05/17/2021 0819 ?Last data filed at 05/17/2021 0809 ?Gross per 24 hour  ?Intake 660.97 ml  ?Output --  ?Net 660.97 ml  ? ? ?  05/17/2021  ?  7:36 AM 05/16/2021  ?  5:00 AM 05/15/2021  ?  5:00 AM  ?Last 3 Weights  ?Weight (lbs) 197 lb 12 oz 197 lb 12 oz 196 lb 13.9 oz  ?Weight (kg) 89.7 kg 89.7 kg 89.3 kg  ?   ? ?Telemetry  ?  ?Precardioversion atrial fibrillation heart rate 104- Personally Reviewed ? ?Post cardioversion, sinus rhythm heart rate 63 ? ?ECG  ?  ?Sinus rhythm- Personally Reviewed ? ?Physical Exam  ? ?GEN: No acute distress.   ?Neck: No JVD ?Cardiac: RRR, no murmurs, rubs, or gallops.  ?Respiratory: Clear to auscultation bilaterally. ?GI: Soft, nontender, non-distended  ?MS: No edema; No deformity. ?Neuro:  Nonfocal  ?Psych: Normal affect  ? ?Labs  ?  ?High Sensitivity Troponin:   ?Recent Labs  ?Lab 05/13/21 ?2037 05/14/21 ?1275  ?TROPONINIHS 20* 19*  ?   ?Chemistry ?Recent Labs  ?Lab 05/14/21 ?1700 05/15/21 ?1749  05/16/21 ?4496 05/17/21 ?7591  ?NA 143 144 141 140  ?K 3.4* 3.6 3.7 4.5  ?CL 103 102 100 100  ?CO2 31 32 31 29  ?GLUCOSE 127* 100* 99 152*  ?BUN 52* 51* 53* 60*  ?CREATININE 2.60* 2.46* 2.55* 3.23*  ?CALCIUM 8.5* 8.5* 8.3* 8.6*  ?MG 2.3 2.3  --   --   ?GFRNONAA 24* 26* 24* 18*  ?ANIONGAP '9 10 10 11  '$ ?  ?Lipids No results for input(s): CHOL, TRIG, HDL, LABVLDL, LDLCALC, CHOLHDL in the last 168 hours.  ?Hematology ?Recent Labs  ?Lab 05/13/21 ?2037  ?WBC 5.5  ?RBC 3.84*  ?HGB 10.7*  ?HCT 34.3*  ?MCV 89.3  ?MCH 27.9  ?MCHC 31.2  ?RDW 16.0*  ?PLT 161  ? ?Thyroid  ?Recent Labs  ?Lab 05/15/21 ?6384  ?TSH 5.107*  ?  ?BNP ?Recent Labs  ?Lab 05/13/21 ?2034  ?BNP 1,597.6*  ?  ?DDimer No results for input(s): DDIMER in the last 168 hours.  ? ?Radiology  ?  ?No results found. ? ?Cardiac Studies  ? ?TTE 04/2021 ?1. Left ventricular ejection fraction, by estimation, is 30 to 35%. The  ?left  ventricle has moderately decreased function. The left ventricle  ?demonstrates global hypokinesis. The left ventricular internal cavity size  ?was moderately dilated. Left  ?ventricular diastolic parameters are indeterminate.  ? 2. Right ventricular systolic function is moderately reduced. The right  ?ventricular size is moderately enlarged. There is moderately elevated  ?pulmonary artery systolic pressure. The estimated right ventricular  ?systolic pressure is 40.9 mmHg.  ? 3. Left atrial size was moderately dilated.  ? 4. The mitral valve is normal in structure. Moderate mitral valve  ?regurgitation. No evidence of mitral stenosis.  ? 5. Tricuspid valve regurgitation is moderate.  ? 6. The aortic valve has been repaired/replaced. 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 Aortic valve  ?regurgitation is not visualized. No aortic stenosis is present.  ? 7. The inferior vena cava is dilated in size with <50% respiratory  ?variability, suggesting right atrial pressure of 15 mmHg.  ? ?Patient Profile  ?   ?81 y.o.  male with history of CVA aortic stenosis s/p TAVR, SVT s/p ablation, persistent atrial fibrillation, HFrEF being seen for persistent atrial fibrillation and heart failure ? ?Assessment & Plan  ?  ?Persistent atrial fibrillation ?-S/p DC cardioversion today successful ?-Continue Eliquis, amiodarone, carvedilol ? ?2.  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  Holding ACE/ARB/Arni due to acute on CKD ? ?3.  CKD ?-Monitor creatinine related to the recent ?-avoid nephrotoxins ? ?Total encounter time more than 50 minutes ? Greater than 50% was spent in counseling and coordination of care with the patient  ? ?   ?Signed, ?Kate Sable, MD  ?05/17/2021, 8:19 AM   ? ?

## 2021-05-17 NOTE — Transfer of Care (Signed)
Immediate Anesthesia Transfer of Care Note ? ?Patient: Timothy Riley [facility] ? ?Procedure(s) Performed: CARDIOVERSION ? ?Patient Location: PACU ? ?Anesthesia Type:General ? ?Level of Consciousness: awake, alert  and oriented ? ?Airway & Oxygen Therapy: Patient Spontanous Breathing ? ?Post-op Assessment: Report given to RN and Post -op Vital signs reviewed and stable ? ?Post vital signs: Reviewed and stable ? ?Last Vitals:  ?Vitals Value Taken Time  ?BP 116/68 05/17/21 0812  ?Temp    ?Pulse 64 05/17/21 0813  ?Resp 13 05/17/21 0813  ?SpO2 100 % 05/17/21 0813  ? ? ?Last Pain:  ?Vitals:  ? 05/17/21 0736  ?TempSrc: Oral  ?PainSc:   ?   ? ?  ? ?Complications: No notable events documented. ?

## 2021-05-17 NOTE — Anesthesia Preprocedure Evaluation (Signed)
Anesthesia Evaluation  ?Patient identified by MRN, date of birth, ID band ?Patient awake ? ? ? ?Reviewed: ?Allergy & Precautions, NPO status , Patient's Chart, lab work & pertinent test results ? ?History of Anesthesia Complications ?Negative for: history of anesthetic complications ? ?Airway ?Mallampati: II ? ?TM Distance: >3 FB ?Neck ROM: Full ? ? ? Dental ? ?(+) Poor Dentition ?  ?Pulmonary ?neg shortness of breath, neg sleep apnea, neg COPD, neg recent URI, former smoker,  ?  ?breath sounds clear to auscultation- rhonchi ?(-) wheezing ? ? ? ? ? Cardiovascular ?hypertension, (-) angina+ Peripheral Vascular Disease and +CHF  ?(-) CAD, (-) Past MI and (-) Cardiac Stents + dysrhythmias Atrial Fibrillation and Supra Ventricular Tachycardia  ?Rhythm:Regular Rate:Normal ?- Systolic murmurs and - Diastolic murmurs ?Echo 03/14/18: ?1. The left ventricle has normal systolic function with an ejection fraction of 60-65%. The cavity size was normal. There is mildly increased left ventricular wall thickness. Left ventricular diastolic Doppler parameters are consistent with impaired  ?relaxation. ? 2. The right ventricle has normal systolic function. The cavity was normal. There is no increase in right ventricular wall thickness. ? 3. Left atrial size was mildly dilated. ? 4. 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 Procedure Date: 03/01/14 Normal aortic valve prosthesis. AV Mean Grad: 16.0 mmHg ? 5. Right atrial pressure is estimated at 10 mmHg. ?  ?Neuro/Psych ?neg Seizures negative neurological ROS ? negative psych ROS  ? GI/Hepatic ?negative GI ROS, Neg liver ROS,   ?Endo/Other  ?diabetes, Oral Hypoglycemic Agents ? Renal/GU ?Renal InsufficiencyRenal disease  ? ?  ?Musculoskeletal ? ?(+) Arthritis ,  ? Abdominal ?(+) + obese,   ?Peds ? Hematology ? ?(+) Blood dyscrasia, anemia ,   ?Anesthesia Other Findings ?Past Medical History: ?03/04/2014: 1st degree AV  block ?12/31/2013: Aortic heart valve narrowing ?    Comment:  Overview:  Sp #23 pericardial edwards valve repalcement  ?             2016  ?No date: Aortic stenosis ?    Comment:  a. 01/2014 s/p AVR with 23 mm Carpentier-Edwards  ?             pericardial tissue valve by D. Glower MD @ [facility] via R  ?             thoracic approach; b. 03/2018 Echo: EF 60-65%, DD. Mildly  ?             dil LA. Nl fxn AoV prosthesis. Grad 59mHg. 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    Comment:  a. 12/2013 Cath ([facility]): LAD 30p, RI 60, otw nl (per CT  ?             [place]; b. 03/2018 MV: EF 59%, no  ?             ischemia/scar. ?10/2017: Persistent atrial fibrillation [place] ?    Comment:  a. CHA2DS2VASc = 5-->Eliquis; b. 12/2017 loaded  ?             w/amio-->DCCV. 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    Comment:  a. 05/2014 s/p RFCA @ [facility]. ?No date: Right inguinal hernia ?No date: Urinary incontinence ? ? Reproductive/Obstetrics ? ?  ? ? ? ? ? ? ? ? ? ? ? ? ? ?  ?  ? ? ? ? ? ? ? ? ?  Anesthesia Physical ? ?Anesthesia Plan ? ?ASA: 3 ? ?Anesthesia Plan: General  ? ?Post-op Pain Management:   ? ?Induction: Intravenous ? ?PONV Risk Score and Plan: 2 and Propofol infusion and TIVA ? ?Airway Management Planned: Natural Airway and Nasal Cannula ? ?Additional Equipment:  ? ?Intra-op Plan:  ? ?Post-operative Plan:  ? ?Informed Consent: I have reviewed the patients History and Physical, chart, labs and discussed the procedure including the risks, benefits and alternatives for the proposed anesthesia with the patient or authorized representative who has indicated his/her understanding and acceptance.  ? ? ? ?Dental advisory given ? ?Plan  Discussed with: CRNA and Anesthesiologist ? ?Anesthesia Plan Comments:   ? ? ? ? ? ? ?Anesthesia Quick Evaluation ? ?

## 2021-05-17 NOTE — Anesthesia Procedure Notes (Signed)
Date/Time: 05/17/2021 8:06 AM ?Performed by: Hedda Slade, CRNA ?Pre-anesthesia Checklist: Patient identified, Emergency Drugs available, Suction available, Patient being monitored and Timeout performed ?Patient Re-evaluated:Patient Re-evaluated prior to induction ?Oxygen Delivery Method: Nasal cannula ?Induction Type: IV induction ? ? ? ? ?

## 2021-05-18 ENCOUNTER — Ambulatory Visit: Admission: RE | Admit: 2021-05-18 | Payer: Medicare Other | Source: Home / Self Care | Admitting: Cardiovascular Disease

## 2021-05-18 ENCOUNTER — Encounter: Admission: RE | Payer: Self-pay | Source: Home / Self Care

## 2021-05-18 ENCOUNTER — Inpatient Hospital Stay (HOSPITAL_COMMUNITY)
Admit: 2021-05-18 | Discharge: 2021-05-18 | Disposition: A | Discharge status: Home / Self Care | Payer: Medicare Other | Attending: Cardiovascular Disease | Admitting: Cardiovascular Disease

## 2021-05-18 DIAGNOSIS — I420 Dilated cardiomyopathy: Secondary | ICD-10-CM | POA: Diagnosis not present

## 2021-05-18 DIAGNOSIS — I5043 Acute on chronic combined systolic (congestive) and diastolic (congestive) heart failure: Secondary | ICD-10-CM | POA: Diagnosis not present

## 2021-05-18 DIAGNOSIS — N1832 Chronic kidney disease, stage 3b: Secondary | ICD-10-CM | POA: Diagnosis not present

## 2021-05-18 DIAGNOSIS — I25118 Atherosclerotic heart disease of native coronary artery with other forms of angina pectoris: Secondary | ICD-10-CM | POA: Diagnosis not present

## 2021-05-18 DIAGNOSIS — I4819 Other persistent atrial fibrillation: Secondary | ICD-10-CM | POA: Diagnosis not present

## 2021-05-18 DIAGNOSIS — I5033 Acute on chronic diastolic (congestive) heart failure: Secondary | ICD-10-CM | POA: Diagnosis not present

## 2021-05-18 DIAGNOSIS — N179 Acute kidney failure, unspecified: Secondary | ICD-10-CM | POA: Diagnosis not present

## 2021-05-18 DIAGNOSIS — I428 Other cardiomyopathies: Secondary | ICD-10-CM

## 2021-05-18 DIAGNOSIS — E119 Type 2 diabetes mellitus without complications: Secondary | ICD-10-CM

## 2021-05-18 LAB — ECHOCARDIOGRAM LIMITED
Calc EF: 25.3 %
Height: 68 in
S' Lateral: 4.82 cm
Single Plane A2C EF: 23.3 %
Single Plane A4C EF: 23.2 %
Weight: 3236.8 oz

## 2021-05-18 LAB — BASIC METABOLIC PANEL
Anion gap: 8 (ref 5–15)
BUN: 63 mg/dL — ABNORMAL HIGH (ref 8–23)
CO2: 28 mmol/L (ref 22–32)
Calcium: 8.4 mg/dL — ABNORMAL LOW (ref 8.9–10.3)
Chloride: 104 mmol/L (ref 98–111)
Creatinine, Ser: 3.44 mg/dL — ABNORMAL HIGH (ref 0.61–1.24)
GFR, Estimated: 17 mL/min — ABNORMAL LOW (ref >60)
Glucose, Bld: 144 mg/dL — ABNORMAL HIGH (ref 70–99)
Potassium: 4.8 mmol/L (ref 3.5–5.1)
Sodium: 140 mmol/L (ref 135–145)

## 2021-05-18 LAB — GLUCOSE, CAPILLARY
Glucose-Capillary: 141 mg/dL — ABNORMAL HIGH (ref 70–99)
Glucose-Capillary: 143 mg/dL — ABNORMAL HIGH (ref 70–99)
Glucose-Capillary: 175 mg/dL — ABNORMAL HIGH (ref 70–99)
Glucose-Capillary: 143 mg/dL — ABNORMAL HIGH (ref 70–99)
Glucose-Capillary: 185 mg/dL — ABNORMAL HIGH (ref 70–99)

## 2021-05-18 LAB — CBC
HCT: 34.7 % — ABNORMAL LOW (ref 39.0–52.0)
Hemoglobin: 10.6 g/dL — ABNORMAL LOW (ref 13.0–17.0)
MCH: 27.8 pg (ref 26.0–34.0)
MCHC: 30.5 g/dL (ref 30.0–36.0)
MCV: 91.1 fL (ref 80.0–100.0)
Platelets: 172 10*3/uL (ref 150–400)
RBC: 3.81 MIL/uL — ABNORMAL LOW (ref 4.22–5.81)
RDW: 16.6 % — ABNORMAL HIGH (ref 11.5–15.5)
WBC: 6 10*3/uL (ref 4.0–10.5)
nRBC: 0 % (ref 0.0–0.2)

## 2021-05-18 SURGERY — CARDIOVERSION
Anesthesia: General

## 2021-05-18 MED ORDER — DOCUSATE SODIUM 100 MG PO CAPS
200.0000 mg | ORAL_CAPSULE | Freq: Two times a day (BID) | ORAL | Status: DC
  Administered 2021-05-18 – 2021-05-29 (×21): 200 mg via ORAL
  Filled 2021-05-18 (×23): qty 2

## 2021-05-18 MED ORDER — POLYETHYLENE GLYCOL 3350 17 G PO PACK
17.0000 g | PACK | Freq: Every day | ORAL | Status: DC | PRN
  Administered 2021-05-18: 17 g via ORAL
  Filled 2021-05-18: qty 1

## 2021-05-18 NOTE — Anesthesia Postprocedure Evaluation (Signed)
Anesthesia Post Note ? ?Patient: Timothy Riley [facility] ? ?Procedure(s) Performed: CARDIOVERSION ? ?Patient location during evaluation: [facility] ?Anesthesia Type: General ?Level of consciousness: awake and alert ?Pain management: pain level controlled ?Vital Signs Assessment: post-procedure vital signs reviewed and stable ?Respiratory status: spontaneous breathing, nonlabored ventilation, respiratory function stable and patient connected to nasal cannula oxygen ?Cardiovascular status: blood pressure returned to baseline and stable ?Postop Assessment: no apparent nausea or vomiting ?Anesthetic complications: no ? ? ?No notable events documented. ? ? ?Last Vitals:  ?Vitals:  ? 05/18/21 0743 05/18/21 1241  ?BP: 127/84 115/70  ?Pulse: 64 (!) 59  ?Resp: 17 18  ?Temp: 36.8 ?C (!) 36.4 ?C  ?SpO2: 99% 97%  ?  ?Last Pain:  ?Vitals:  ? 05/18/21 0900  ?TempSrc:   ?PainSc: 0-No pain  ? ? ?  ?  ?  ?  ?  ?  ? ?Martha Clan ? ? ? ? ?

## 2021-05-18 NOTE — TOC Progression Note (Signed)
Transition of Care (TOC) - Progression Note  ? ? ?Patient Details  ?Name: Timothy Riley [facility] ?MRN: [id number] ?Date of Birth: 1940/02/07 ? ?Transition of Care (TOC) CM/SW Contact  ?Laurena Slimmer, RN ?Phone Number: ?05/18/2021, 4:01 PM ? ?Clinical Narrative:    ?Attempt to contact son for choice of outpatient therapy. No answer. Family not in room.  ? ?  ?  ? ?Expected Discharge Plan and Services ?  ?  ?  ?  ?  ?                ?  ?  ?  ?  ?  ?  ?  ?  ?  ?  ? ? ?Social Determinants of Health (SDOH) Interventions ?  ? ?Readmission Risk Interventions ?   ? View : No data to display.  ?  ?  ?  ? ? ?

## 2021-05-18 NOTE — Evaluation (Signed)
Occupational Therapy Evaluation ?Patient Details ?Name: Gearl Baratta [facility] ?MRN: [id number] ?DOB: [birth year] ?Today's Date: 05/18/2021 ? ? ?History of Present Illness Pt admitted for acute on chronic heart failure and is currently s/p cardioversion. Medical history significant for Persistent A-fib with prior cardioversion, on Eliquis, s/p bioprosthetic AVR, G2 DD (EF 50 to 55% 08/2020), diabetes, nonobstructive CAD with low risk Myoview in 2020, HTN, CKD 3B,hospitalized from 2/8 to 03/15/2021 with acute gastroenteritis and CHF exacerbation  complicated by AKI related to diuretic therapy who presents to the ED  with a complaint of shortness of breath, abdominal distention and bilateral lower extremity edema that has not been responding to outpatient therapy.  ? ?Clinical Impression ?  ?Mr. Kistner presents with generalized weakness, abdominal distension,  LE edema, and reduced endurance. He reports that he has been feeling poorly for ~ 5 months, with one diagnosis after another (COVID, bronchitis, flu, CHF, CKD) since Nov 2022 and worries that he may be nearing the end of his life. Prior to current illnesses, he has been IND in fxl mobility; he hunts, goes fishing, drives, spends time with grandchildren. Upon hospital DC, he is moving into a fully accessible home on the same property as his son's home. During today's evaluation he complains of mild LE pain. He is able to perform transfers, grooming, dressing, toileting, as well as dancing, with Mod I, no AD, although he does occasionally reach out to wall or furniture for stabilization. He reports high stress level at present, educ provided. Pt and therapist in agreement that pt does need any additional OT services at this time, being at baseline level of fxl mobility and Mod I in ADL/IADL performance. Will sign off.  ? ?Recommendations for follow up therapy are one component of a multi-disciplinary discharge planning process, led by the attending physician.  Recommendations  may be updated based on patient status, additional functional criteria and insurance authorization.  ? ?Follow Up Recommendations ? No OT follow up  ?  ?Assistance Recommended at Discharge PRN  ?Patient can return home with the following   ? ?  ?Functional Status Assessment ? Patient has had a recent decline in their functional status and demonstrates the ability to make significant improvements in function in a reasonable and predictable amount of time.  ?Equipment Recommendations ? None recommended by OT  ?  ?Recommendations for Other Services   ? ? ?  ?Precautions / Restrictions Precautions ?Precautions: Fall ?Restrictions ?Weight Bearing Restrictions: No  ? ?  ? ?Mobility Bed Mobility ?Overal bed mobility: Independent ?  ?  ?  ?  ?  ?  ?  ?  ? ?Transfers ?Overall transfer level: Modified independent ?Equipment used: None ?  ?  ?  ?  ?  ?  ?  ?  ?  ? ?  ?Balance Overall balance assessment: Modified Independent ?  ?  ?  ?  ?  ?  ?  ?  ?  ?  ?  ?  ?  ?  ?  ?  ?  ?  ?   ? ?ADL either performed or assessed with clinical judgement  ? ?ADL Overall ADL's : Modified independent ?  ?  ?  ?  ?  ?  ?  ?  ?  ?  ?  ?  ?  ?  ?  ?  ?  ?  ?  ?   ? ? ? ?Vision Patient Visual Report: No change from baseline ?   ?   ?  Perception   ?  ?Praxis   ?  ? ?Pertinent Vitals/Pain Pain Assessment ?Pain Assessment: 0-10 ?Pain Score: 2  ?Pain Location: b/l LE, secondary to edema ?Pain Descriptors / Indicators: Pressure ?Pain Intervention(s): Repositioned  ? ? ? ?Hand Dominance   ?  ?Extremity/Trunk Assessment Upper Extremity Assessment ?Upper Extremity Assessment: Overall WFL for tasks assessed ?  ?Lower Extremity Assessment ?Lower Extremity Assessment: Generalized weakness ?  ?Cervical / Trunk Assessment ?Cervical / Trunk Assessment: Normal ?  ?Communication Communication ?Communication: HOH ?  ?Cognition Arousal/Alertness: Awake/alert ?Behavior During Therapy: [facility] for tasks assessed/performed ?Overall Cognitive Status: Within Functional  Limits for tasks assessed ?  ?  ?  ?  ?  ?  ?  ?  ?  ?  ?  ?  ?  ?  ?  ?  ?  ?  ?  ?General Comments  some "furniture walking" during ambulation ? ?  ?Exercises Other Exercises ?Other Exercises: Educ re: edema mgmt, stress mgmt ?  ?Shoulder Instructions    ? ? ?Home Living Family/patient expects to be discharged to:: Private residence ?Living Arrangements: Alone ?Available Help at Discharge: Family;Available PRN/intermittently ?Type of Home: House ?Home Access: Level entry ?  ?  ?Home Layout: One level ?  ?  ?Bathroom Shower/Tub: Walk-in shower ?  ?Bathroom Toilet: Handicapped height ?  ?  ?Home Equipment: Conservation officer, nature (2 wheels) ?  ?Additional Comments: Pt moving next week into a new home, fully handicap accessible, built next door to his son's home ?  ? ?  ?Prior Functioning/Environment Prior Level of Function : Independent/Modified Independent ?  ?  ?  ?  ?  ?  ?Mobility Comments: Ambulates w/o AD, 1 fall reported ?ADLs Comments: independent ?  ? ?  ?  ?OT Problem List: Decreased activity tolerance;Cardiopulmonary status limiting activity;Increased edema ?  ?   ?OT Treatment/Interventions:    ?  ?OT Goals(Current goals can be found in the care plan section) Acute Rehab OT Goals ?Patient Stated Goal: to feel better ?OT Goal Formulation: With patient ?Time For Goal Achievement: 06/01/21  ?OT Frequency:   ?  ? ?Co-evaluation   ?  ?  ?  ?  ? ?  ?AM-PAC OT "6 Clicks" Daily Activity     ?Outcome Measure Help from another person eating meals?: None ?Help from another person taking care of personal grooming?: None ?Help from another person toileting, which includes using toliet, bedpan, or urinal?: None ?Help from another person bathing (including washing, rinsing, drying)?: None ?Help from another person to put on and taking off regular upper body clothing?: None ?Help from another person to put on and taking off regular lower body clothing?: None ?6 Click Score: 24 ?  ?End of Session   ? ?Activity Tolerance: Patient  tolerated treatment well ?Patient left: in chair;with call bell/phone within reach ? ?OT Visit Diagnosis: Muscle weakness (generalized) (M62.81);Pain ?Pain - part of body: Leg  ?              ?Time: 6861-6837 ?OT Time Calculation (min): 35 min ?Charges:  OT General Charges ?$OT Visit: 1 Visit ?OT Evaluation ?$OT Eval Low Complexity: 1 Low ?OT Treatments ?$Self Care/Home Management : 23-37 mins ?Josiah Lobo, PhD, MS, OTR/L ?05/18/21, 12:27 PM ? ?

## 2021-05-18 NOTE — Progress Notes (Signed)
*  PRELIMINARY RESULTS* ?Echocardiogram ?2D Echocardiogram has been performed. ? ?Timothy Riley ?05/18/2021, 11:00 AM ?

## 2021-05-18 NOTE — Progress Notes (Signed)
PT Cancellation Note ? ?Patient Details ?Name: Timothy Riley [facility] ?MRN: [id number] ?DOB: 07-09-1940 ? ? ?Cancelled Treatment:    Reason Eval/Treat Not Completed: Patient declined, no reason specified. Patient states he wants to rest. States he has no trouble getting around. Will re-attempt as time allows and as needed.  ? ? ?[name] ?05/18/2021, 10:48 AM ?

## 2021-05-18 NOTE — TOC Progression Note (Signed)
Transition of Care (TOC) - Progression Note  ? ? ?Patient Details  ?Name: Mehki Klumpp [facility] ?MRN: [id number] ?Date of Birth: 10-09-1940 ? ?Transition of Care (TOC) CM/SW Contact  ?Laurena Slimmer, RN ?Phone Number: ?05/18/2021, 12:17 PM ? ?Clinical Narrative:    ?Attempt to contact patient son in reference to outpatient therapy. No answer. Left a message in reference to nature of call and request for return call.  ? ? ?  ?  ? ?Expected Discharge Plan and Services ?  ?  ?  ?  ?  ?                ?  ?  ?  ?  ?  ?  ?  ?  ?  ?  ? ? ?Social Determinants of Health (SDOH) Interventions ?  ? ?Readmission Risk Interventions ?   ? View : No data to display.  ?  ?  ?  ? ? ?

## 2021-05-18 NOTE — Progress Notes (Signed)
? ? ?Progress Note ? ?Patient Name: Timothy Riley [facility] ?Date of Encounter: 05/18/2021 ? ?Primary Cardiologist: Rockey Situ ? ?Subjective  ? ?He underwent successful DCCV yesterday with conversion to sinus rhythm.  Renal function continues to up trend. No chest pain, dyspnea, palpitations, dizziness, presyncope, or syncope.  ? ?Inpatient Medications  ?  ?Scheduled Meds: ? amiodarone  400 mg Oral BID  ? apixaban  2.5 mg Oral BID  ? carvedilol  12.5 mg Oral BID WC  ? insulin aspart  0-9 Units Subcutaneous TID WC  ? potassium chloride SA  20 mEq Oral BID  ? rosuvastatin  10 mg Oral Daily  ? torsemide  40 mg Oral Daily  ? ?Continuous Infusions: ? sodium chloride Stopped (05/17/21 1610)  ? sodium chloride    ? ?PRN Meds: ?acetaminophen **OR** acetaminophen, loperamide, melatonin, ondansetron **OR** ondansetron (ZOFRAN) IV  ? ?Vital Signs  ?  ?Vitals:  ? 05/17/21 1938 05/17/21 2329 05/18/21 0439 05/18/21 0457  ?BP: 106/65 100/65 (!) 139/100   ?Pulse: 65 65 88   ?Resp: 20 20    ?Temp: 97.8 ?F (36.6 ?C) 97.7 ?F (36.5 ?C) 98.1 ?F (36.7 ?C)   ?TempSrc:   Oral   ?SpO2: 94% 99% 99%   ?Weight:    91.8 kg  ?Height:      ? ? ?Intake/Output Summary (Last 24 hours) at 05/18/2021 0727 ?Last data filed at 05/17/2021 1840 ?Gross per 24 hour  ?Intake 740 ml  ?Output 0 ml  ?Net 740 ml  ? ?Filed Weights  ? 05/16/21 0500 05/17/21 0736 05/18/21 0457  ?Weight: 89.7 kg 89.7 kg 91.8 kg  ? ? ?Telemetry  ?  ?SR with rates in the upper 50s to 60s bpm - Personally Reviewed ? ?ECG  ?  ?No new tracings - Personally Reviewed ? ?Physical Exam  ? ?GEN: No acute distress.   ?Neck: No JVD. ?Cardiac: RRR, no murmurs, rubs, or gallops.  ?Respiratory: Clear to auscultation bilaterally.  ?GI: Soft, nontender, non-distended.   ?MS: Trivial bilateral pretibial edema; No deformity. ?Neuro:  Alert and oriented x 3; Nonfocal.  ?Psych: Normal affect. ? ?Labs  ?  ?Chemistry ?Recent Labs  ?Lab 05/16/21 ?0630 05/17/21 ?1601 05/18/21 ?0932  ?NA 141 140 140  ?K 3.7 4.5 4.8  ?CL  100 100 104  ?CO2 '31 29 28  '$ ?GLUCOSE 99 152* 144*  ?BUN 53* 60* 63*  ?CREATININE 2.55* 3.23* 3.44*  ?CALCIUM 8.3* 8.6* 8.4*  ?GFRNONAA 24* 18* 17*  ?ANIONGAP '10 11 8  '$ ?  ? ?Hematology ?Recent Labs  ?Lab 05/13/21 ?2037 05/18/21 ?3557  ?WBC 5.5 6.0  ?RBC 3.84* 3.81*  ?HGB 10.7* 10.6*  ?HCT 34.3* 34.7*  ?MCV 89.3 91.1  ?MCH 27.9 27.8  ?MCHC 31.2 30.5  ?RDW 16.0* 16.6*  ?PLT 161 172  ? ? ?Cardiac EnzymesNo results for input(s): TROPONINI in the last 168 hours. No results for input(s): TROPIPOC in the last 168 hours.  ? ?BNP ?Recent Labs  ?Lab 05/13/21 ?2034  ?BNP 1,597.6*  ?  ? ?DDimer No results for input(s): DDIMER in the last 168 hours.  ? ?Radiology  ?  ?No results found. ? ?Cardiac Studies  ? ?2D echo 05/14/2021: ?1. Left ventricular ejection fraction, by estimation, is 30 to 35%. The  ?left ventricle has moderately decreased function. The left ventricle  ?demonstrates global hypokinesis. The left ventricular internal cavity size  ?was moderately dilated. Left  ?ventricular diastolic parameters are indeterminate.  ? 2. Right ventricular systolic function is moderately reduced. The right  ?  ventricular size is moderately enlarged. There is moderately elevated  ?pulmonary artery systolic pressure. The estimated right ventricular  ?systolic pressure is 32.9 mmHg.  ? 3. Left atrial size was moderately dilated.  ? 4. The mitral valve is normal in structure. Moderate mitral valve  ?regurgitation. No evidence of mitral stenosis.  ? 5. Tricuspid valve regurgitation is moderate.  ? 6. The aortic valve has been repaired/replaced. 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 Aortic valve  ?regurgitation is not visualized. No aortic stenosis is present.  ? 7. The inferior vena cava is dilated in size with <50% respiratory  ?variability, suggesting right atrial pressure of 15 mmHg.  ?__________ ? ?2D echo 09/13/2020: ?1. Left ventricular ejection fraction, by estimation, is 50 to 55%. The  ?left  ventricle has low normal function. Left ventricular endocardial  ?border not optimally defined to evaluate regional wall motion. There is  ?mild left ventricular hypertrophy. Left  ?ventricular diastolic parameters are consistent with Grade II diastolic  ?dysfunction (pseudonormalization). Elevated left atrial pressure.  ? 2. Right ventricular systolic function is normal. The right ventricular  ?size is normal. There is moderately elevated pulmonary artery systolic  ?pressure.  ? 3. Left atrial size was moderately dilated.  ? 4. Right atrial size was mildly dilated.  ? 5. The mitral valve is abnormal. Mild mitral valve regurgitation. No  ?evidence of mitral stenosis.  ? 6. Tricuspid valve regurgitation is mild to moderate.  ? 7. The aortic valve has been repaired/replaced. Aortic valve  ?regurgitation is mild to moderate. There is a 23 mm Edwards bioprosthetic  ?valve present in the aortic position. Procedure Date: 03/01/14. Aortic valve  ?mean gradient measures 13.0 mmHg.  ? 8. The inferior vena cava is dilated in size with >50% respiratory  ?variability, suggesting right atrial pressure of 8 mmHg. ?__________ ? ?Lexiscan MPI 03/18/2018: ?Normal pharmacologic myocardial perfusion stress test without ischemia or scar. ?The left ventricular ejection fraction is normal by visual estimation and Siemens calculation (59%). LVEF by QGS is likely reduced due to gating artifact (42%). ?This is a low risk study. ? ?Patient Profile  ?   ?81 y.o. male with history of persistent atrial fibrillation, severe aortic stenosis s/p AVR (2016), HF, SVT s/p ablation, DM2, and HTN admitted with acute HFrEF in the setting of atrial fibrillation. ? ?Assessment & Plan  ?  ?1. Acute HFrEF: ?-LVEF previously low normal to mildly reduced but found to be severely reduced this admission ?-Suspicion is that uncontrolled a-fib may have worsened cardiomyopathy ?-Appears euvolemic ?-Hold torsemide today with continued up trending up renal  function ?-Recommend leg elevation and compression stockings ?-Coreg ?-No ACEi/ARB/ARNI/MRA/SGLT2i in the setting of acute on CKD ?-Escalate GDMT as able ?-Follow up limited echo in several months time to reassess LVSF in sinus rhythm, if cardiopathy persists at that time, will need to discuss ischemic evalaution  ? ?2. Persistent Afib: ?-Maintaining sinus rhythm ?-Continue Coreg and amiodarone to complete 10 gram load of amiodarone  ?-Eliquis ? ?3. Nonobstructive CAD with elevated troponin: ?-Noted on LHC in 2015 prior to AVR ?-Minimal elevation in HS-Tn, not consistent with ACS ?-Eliquis in place of ASA to minimize bleeding risk ?-No plans for inpatient ischemic evaluation at this time  ? ?4. Acute on CKD III: ?-Renal function continues to be above baseline and is trending up this morning  ?-Diuresis on hold ?-Recheck renal function tomorrow  ? ?5. Aortic valve disease s/p bovine AVR in 2016: ?-Functioning normally on  echo this admission ?-Eliquis ?-SBE prophylaxis  ? ?6. HTN: ?-Blood pressure has overall been reasonably controlled ?-Continue current therapy  ? ? ?   ? ?For questions or updates, please contact [place] ?Please consult [email or web address] for contact info under Cardiology/STEMI. ?  ? ?Signed, ?Christell Faith, PA-C ?[facility] ?Pager: [phone] ?05/18/2021, 7:27 AM ? ?

## 2021-05-18 NOTE — Progress Notes (Signed)
Diet changed from NPO to Carb modified ?

## 2021-05-18 NOTE — Progress Notes (Signed)
? ?PROGRESS NOTE ? ? ? ?Timothy Riley  KXF:[id number] DOB: 1940-04-30 DOA: 05/13/2021 ?PCP: Derinda Late, MD  ? ?Assessment & Plan: ?  ?Principal Problem: ?  Acute on chronic heart failure with preserved ejection fraction (HFpEF) ([place]) ?Active Problems: ?  Acute renal failure superimposed on stage 3b chronic kidney disease ([place]) ?  Atrial fibrillation ([place]) ?  Chronic anticoagulation ?  CAD (coronary artery disease) ?  H/O paroxysmal supraventricular tachycardia s/p ablation ?  Controlled type 2 diabetes mellitus without complication, without long-term current use of insulin ([place]) ?  Chronic anemia ?  Hypoglycemia ? ? 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 Hold torsemide secondary to Cr trending up. Monitor I/Os. Cardio following and recs apprec  ?  ?AKI on CKD stage IIIb: possibly secondary to cardiorenal. Cr is trending up daily. Holding diuretics today  ?  ?Hypokalemia: WNL again today  ?  ?Hx of paroxysmal SVT: continue on coreg  ? ?Persistent a. fib: continue on carvedilol, amiodarone, eliquis. S/p cardioversion which was successful  ?  ?DM2: likely poorly controlled. Continue on SSI w/ accuchecks  ? ? ?DVT prophylaxis: eliquis  ?Code Status:  full  ?Family Communication:  ?Disposition Plan: likely d/c back home. Pt refused PT ? ?Level of care: Progressive ? ?Status is: Inpatient ?Remains inpatient appropriate because: Cr is trending up daily  ? ? ? ?Consultants:  ?Cardio  ? ?Procedures:  ? ?Antimicrobials:  ? ? ?Subjective: ?Pt c/o malaise  ? ?Objective: ?Vitals:  ? 05/17/21 1938 05/17/21 2329 05/18/21 0439 05/18/21 0457  ?BP: 106/65 100/65 (!) 139/100   ?Pulse: 65 65 88   ?Resp: 20 20    ?Temp: 97.8 ?F (36.6 ?C) 97.7 ?F (36.5 ?C) 98.1 ?F (36.7 ?C)   ?TempSrc:   Oral   ?SpO2: 94% 99% 99%   ?Weight:    91.8 kg  ?Height:      ? ? ?Intake/Output Summary (Last 24 hours) at 05/18/2021 6967 ?Last data filed at 05/17/2021 1840 ?Gross per 24 hour  ?Intake  740 ml  ?Output 0 ml  ?Net 740 ml  ? ?Filed Weights  ? 05/16/21 0500 05/17/21 0736 05/18/21 0457  ?Weight: 89.7 kg 89.7 kg 91.8 kg  ? ? ?Examination: ? ?General exam: Appears comfortable  ?Respiratory system: diminished breath sounds b/l.  ?Cardiovascular system: S1/S2+. No rubs or clicks  ?Gastrointestinal system: Abd is soft, NT, obese & normal bowel sounds. ?Central nervous system: Awake & oriented. Moves all extremities  ?Psychiatry: Judgement and insight appears normal. Appropriate mood and affect ? ? ? ?Data Reviewed: I have personally reviewed following labs and imaging studies ? ?CBC: ?Recent Labs  ?Lab 05/13/21 ?2037 05/18/21 ?8938  ?WBC 5.5 6.0  ?HGB 10.7* 10.6*  ?HCT 34.3* 34.7*  ?MCV 89.3 91.1  ?PLT 161 172  ? ?Basic Metabolic Panel: ?Recent Labs  ?Lab 05/14/21 ?1017 05/15/21 ?5102 05/16/21 ?5852 05/17/21 ?7782 05/18/21 ?4235  ?NA 143 144 141 140 140  ?K 3.4* 3.6 3.7 4.5 4.8  ?CL 103 102 100 100 104  ?CO2 31 32 '31 29 28  '$ ?GLUCOSE 127* 100* 99 152* 144*  ?BUN 52* 51* 53* 60* 63*  ?CREATININE 2.60* 2.46* 2.55* 3.23* 3.44*  ?CALCIUM 8.5* 8.5* 8.3* 8.6* 8.4*  ?MG 2.3 2.3  --   --   --   ? ?GFR: ?Estimated Creatinine Clearance: 18.2 mL/min (A) (by C-G formula based on SCr of 3.44 mg/dL (H)). ?Liver Function Tests: ?No results  for input(s): AST, ALT, ALKPHOS, BILITOT, PROT, ALBUMIN in the last 168 hours. ?No results for input(s): LIPASE, AMYLASE in the last 168 hours. ?No results for input(s): AMMONIA in the last 168 hours. ?Coagulation Profile: ?Recent Labs  ?Lab 05/16/21 ?0023  ?INR 2.1*  ? ?Cardiac Enzymes: ?No results for input(s): CKTOTAL, CKMB, CKMBINDEX, TROPONINI in the last 168 hours. ?BNP (last 3 results) ?No results for input(s): PROBNP in the last 8760 hours. ?HbA1C: ?No results for input(s): HGBA1C in the last 72 hours. ?CBG: ?Recent Labs  ?Lab 05/17/21 ?1654 05/17/21 ?1937 05/17/21 ?2147 05/17/21 ?2329 05/18/21 ?0430  ?GLUCAP 147* 167* 176* 157* 141*  ? ?Lipid Profile: ?No results for input(s):  CHOL, HDL, LDLCALC, TRIG, CHOLHDL, LDLDIRECT in the last 72 hours. ?Thyroid Function Tests: ?No results for input(s): TSH, T4TOTAL, FREET4, T3FREE, THYROIDAB in the last 72 hours. ? ?Anemia Panel: ?No results for input(s): VITAMINB12, FOLATE, FERRITIN, TIBC, IRON, RETICCTPCT in the last 72 hours. ?Sepsis Labs: ?No results for input(s): PROCALCITON, LATICACIDVEN in the last 168 hours. ? ?No results found for this or any previous visit (from the past 240 hour(s)).  ? ? ? ? ? ?Radiology Studies: ?No results found. ? ? ? ? ? ?Scheduled Meds: ? amiodarone  400 mg Oral BID  ? apixaban  2.5 mg Oral BID  ? carvedilol  12.5 mg Oral BID WC  ? insulin aspart  0-9 Units Subcutaneous TID WC  ? potassium chloride SA  20 mEq Oral BID  ? rosuvastatin  10 mg Oral Daily  ? torsemide  40 mg Oral Daily  ? ?Continuous Infusions: ? sodium chloride Stopped (05/17/21 1610)  ? sodium chloride    ? ? ? LOS: 5 days  ? ? ?Time spent: 33 mins  ? ? ? ?Wyvonnia Dusky, MD ?[facility] ?Pager [phone] ? ?If 7PM-7AM, please contact night-coverage ?05/18/2021, 7:22 AM  ? ?

## 2021-05-19 ENCOUNTER — Encounter: Payer: Self-pay | Admitting: Cardiology

## 2021-05-19 ENCOUNTER — Inpatient Hospital Stay: Payer: Medicare Other

## 2021-05-19 DIAGNOSIS — E875 Hyperkalemia: Secondary | ICD-10-CM

## 2021-05-19 DIAGNOSIS — I4819 Other persistent atrial fibrillation: Secondary | ICD-10-CM | POA: Diagnosis not present

## 2021-05-19 DIAGNOSIS — I5043 Acute on chronic combined systolic (congestive) and diastolic (congestive) heart failure: Secondary | ICD-10-CM | POA: Diagnosis not present

## 2021-05-19 DIAGNOSIS — I25118 Atherosclerotic heart disease of native coronary artery with other forms of angina pectoris: Secondary | ICD-10-CM | POA: Diagnosis not present

## 2021-05-19 DIAGNOSIS — N170 Acute kidney failure with tubular necrosis: Secondary | ICD-10-CM

## 2021-05-19 LAB — GLUCOSE, CAPILLARY
Glucose-Capillary: 119 mg/dL — ABNORMAL HIGH (ref 70–99)
Glucose-Capillary: 173 mg/dL — ABNORMAL HIGH (ref 70–99)
Glucose-Capillary: 185 mg/dL — ABNORMAL HIGH (ref 70–99)
Glucose-Capillary: 139 mg/dL — ABNORMAL HIGH (ref 70–99)

## 2021-05-19 LAB — CBC
HCT: 35.7 % — ABNORMAL LOW (ref 39.0–52.0)
Hemoglobin: 10.8 g/dL — ABNORMAL LOW (ref 13.0–17.0)
MCH: 27.6 pg (ref 26.0–34.0)
MCHC: 30.3 g/dL (ref 30.0–36.0)
MCV: 91.3 fL (ref 80.0–100.0)
Platelets: 186 10*3/uL (ref 150–400)
RBC: 3.91 MIL/uL — ABNORMAL LOW (ref 4.22–5.81)
RDW: 16.4 % — ABNORMAL HIGH (ref 11.5–15.5)
WBC: 6.4 10*3/uL (ref 4.0–10.5)
nRBC: 0.5 % — ABNORMAL HIGH (ref 0.0–0.2)

## 2021-05-19 LAB — BASIC METABOLIC PANEL
Anion gap: 10 (ref 5–15)
BUN: 70 mg/dL — ABNORMAL HIGH (ref 8–23)
CO2: 30 mmol/L (ref 22–32)
Calcium: 8.5 mg/dL — ABNORMAL LOW (ref 8.9–10.3)
Chloride: 100 mmol/L (ref 98–111)
Creatinine, Ser: 4.05 mg/dL — ABNORMAL HIGH (ref 0.61–1.24)
GFR, Estimated: 14 mL/min — ABNORMAL LOW (ref >60)
Glucose, Bld: 143 mg/dL — ABNORMAL HIGH (ref 70–99)
Potassium: 5.4 mmol/L — ABNORMAL HIGH (ref 3.5–5.1)
Sodium: 140 mmol/L (ref 135–145)

## 2021-05-19 LAB — ALBUMIN: Albumin: 3.2 g/dL — ABNORMAL LOW (ref 3.5–5.0)

## 2021-05-19 LAB — POTASSIUM: Potassium: 5.4 mmol/L — ABNORMAL HIGH (ref 3.5–5.1)

## 2021-05-19 MED ORDER — SODIUM ZIRCONIUM CYCLOSILICATE 10 G PO PACK
10.0000 g | PACK | Freq: Once | ORAL | Status: AC
Start: 2021-05-19 — End: 2021-05-19
  Administered 2021-05-19: 10 g via ORAL
  Filled 2021-05-19: qty 1

## 2021-05-19 MED ORDER — CARVEDILOL 6.25 MG PO TABS
6.2500 mg | ORAL_TABLET | Freq: Two times a day (BID) | ORAL | Status: DC
  Administered 2021-05-19 – 2021-05-29 (×14): 6.25 mg via ORAL
  Filled 2021-05-19 (×18): qty 1

## 2021-05-19 MED ORDER — SODIUM ZIRCONIUM CYCLOSILICATE 10 G PO PACK
10.0000 g | PACK | Freq: Once | ORAL | Status: AC
  Administered 2021-05-19: 10 g via ORAL
  Filled 2021-05-19: qty 1

## 2021-05-19 NOTE — Progress Notes (Signed)
Bladder scan showing 254 cc's with no sense of urgency to urinate for patient. ?

## 2021-05-19 NOTE — Progress Notes (Signed)
VAST consulted to obtain new IV access as patient demanding IV be removed from [facility] area. Upon arrival at bedside, spoke with patient who stated he was not getting anything through the PIV. Patient currently being monitored for increased creatinine; however, due to cardiac condition, IV fluids will not be started at this time. Removed PIV from patient's right AC as charted. Informed patient and RN that no IV will be placed at this time. If PIV is needed later, IV team consult can be placed and VAST RN will return. Joy, RN and patient both verbalized understanding. ?

## 2021-05-19 NOTE — Progress Notes (Addendum)
Tech reported that patient has greater than 305 mls of urine left in bladder when bladder scan was performed. He had over 200 mls in bladder on day shift. Notified provider Randol Kern. Provider does not want to do in and out at this time. ?

## 2021-05-19 NOTE — Progress Notes (Signed)
Physical Therapy Treatment ?Patient Details ?Name: Timothy Riley [facility] ?MRN: [id number] ?DOB: 22-May-1940 ?Today's Date: 05/19/2021 ? ? ?History of Present Illness Pt admitted for acute on chronic heart failure and is currently s/p cardioversion. Medical history significant for Persistent A-fib with prior cardioversion, on Eliquis, s/p bioprosthetic AVR, G2 DD (EF 50 to 55% 08/2020), diabetes, nonobstructive CAD with low risk Myoview in 2020, HTN, CKD 3B,hospitalized from 2/8 to 03/15/2021 with acute gastroenteritis and CHF exacerbation  complicated by AKI related to diuretic therapy who presents to the ED  with a complaint of shortness of breath, abdominal distention and bilateral lower extremity edema that has not been responding to outpatient therapy. ? ?  ?PT Comments  ? ? Modified session spent with pt today focusing on education due to pt refusing to get out of bed. Pt educated on benefits of mobility, reviewed B LE strengthening exercises, and discussed POC/discharge recommendations. Continue PT next available date/time. ?  ?Recommendations for follow up therapy are one component of a multi-disciplinary discharge planning process, led by the attending physician.  Recommendations may be updated based on patient status, additional functional criteria and insurance authorization. ? ?Follow Up Recommendations ? Outpatient PT ?  ?  ?Assistance Recommended at Discharge PRN  ?Patient can return home with the following A little help with walking and/or transfers;Help with stairs or ramp for entrance ?  ?Equipment Recommendations ?    ?  ?Recommendations for Other Services   ? ? ?  ?Precautions / Restrictions Precautions ?Precautions: Fall ?Restrictions ?Weight Bearing Restrictions: No  ?  ? ?Mobility ? Bed Mobility ?Overal bed mobility: Independent ?  ?  ?  ?  ?  ?  ?  ?  ? ?Transfers ?Overall transfer level: Modified independent ?  ?  ?  ?  ?  ?  ?  ?  ?  ?  ? ?Ambulation/Gait ?  ?  ?  ?  ?  ?  ?  ?General Gait Details:  declined mobility despite education on the importance/benefits ? ? ?Stairs ?  ?  ?  ?  ?  ? ? ?Wheelchair Mobility ?  ? ?Modified Rankin (Stroke Patients Only) ?  ? ? ?  ?Balance   ?  ?  ?  ?  ?  ?  ?  ?  ?  ?  ?  ?  ?  ?  ?  ?  ?  ?  ?  ? ?  ?Cognition Arousal/Alertness: Awake/alert ?Behavior During Therapy: [facility] for tasks assessed/performed ?Overall Cognitive Status: Within Functional Limits for tasks assessed ?  ?  ?  ?  ?  ?  ?  ?  ?  ?  ?  ?  ?  ?  ?  ?  ?  ?  ?  ? ?  ?Exercises   ? ?  ?General Comments General comments (skin integrity, edema, etc.): Spent time with pt discussing importance of frequent mobility, sitting up in chair.  Reviewed B LE strengthening exercises, pt able to demonstrate proper technique. ?  ?  ? ?Pertinent Vitals/Pain Pain Assessment ?Pain Assessment: No/denies pain  ? ? ?Home Living   ?  ?  ?  ?  ?  ?  ?  ?  ?  ?   ?  ?Prior Function    ?  ?  ?   ? ?PT Goals (current goals can now be found in the care plan section) Acute Rehab PT Goals ?Patient Stated Goal: to get his heart  fixed ? ?  ?Frequency ? ? ? Min 2X/week ? ? ? ?  ?PT Plan Current plan remains appropriate  ? ? ?Co-evaluation   ?  ?  ?  ?  ? ?  ?AM-PAC PT "6 Clicks" Mobility   ?Outcome Measure ? Help needed turning from your back to your side while in a flat bed without using bedrails?: None ?Help needed moving from lying on your back to sitting on the side of a flat bed without using bedrails?: None ?Help needed moving to and from a bed to a chair (including a wheelchair)?: None ?Help needed standing up from a chair using your arms (e.g., wheelchair or bedside chair)?: None ?Help needed to walk in hospital room?: A Little ?Help needed climbing 3-5 steps with a railing? : A Little ?6 Click Score: 22 ? ?  ?End of Session   ?Activity Tolerance: Patient limited by fatigue ?Patient left: in bed ?  ?PT Visit Diagnosis: Unsteadiness on feet (R26.81);Muscle weakness (generalized) (M62.81);Difficulty in walking, not elsewhere classified  (R26.2) ?  ? ? ?Time: 4270-6237 ?PT Time Calculation (min) (ACUTE ONLY): 8 min ? ?Charges:  $Therapeutic Activity: 8-22 mins          ?          ?[name], PTA ? ? ?[name] ?05/19/2021, 2:24 PM ? ?

## 2021-05-19 NOTE — Progress Notes (Signed)
? ? ?Progress Note ? ?Patient Name: Timothy Riley [facility] ?Date of Encounter: 05/19/2021 ? ?Primary Cardiologist: Rockey Situ ? ?Subjective  ? ?Maintaining sinus rhythm following DCCV 05/17/2021.  Renal function continues to decline despite holding of diuretic. No chest pain, dyspnea, palpitations, dizziness, presyncope, or syncope.  ? ?Inpatient Medications  ?  ?Scheduled Meds: ? amiodarone  400 mg Oral BID  ? apixaban  2.5 mg Oral BID  ? carvedilol  12.5 mg Oral BID WC  ? docusate sodium  200 mg Oral BID  ? insulin aspart  0-9 Units Subcutaneous TID WC  ? potassium chloride SA  20 mEq Oral BID  ? rosuvastatin  10 mg Oral Daily  ? sodium zirconium cyclosilicate  10 g Oral Once  ? ?Continuous Infusions: ? sodium chloride Stopped (05/17/21 1610)  ? sodium chloride    ? ?PRN Meds: ?acetaminophen **OR** acetaminophen, loperamide, melatonin, ondansetron **OR** ondansetron (ZOFRAN) IV, polyethylene glycol  ? ?Vital Signs  ?  ?Vitals:  ? 05/18/21 1535 05/18/21 2007 05/18/21 2321 05/19/21 0319  ?BP: 109/71 112/72 105/61 125/70  ?Pulse: (!) 57 (!) 58 (!) 58 (!) 58  ?Resp: '16 16 16 16  '$ ?Temp: 97.6 ?F (36.4 ?C) (!) 97.4 ?F (36.3 ?C) 97.8 ?F (36.6 ?C) 98.3 ?F (36.8 ?C)  ?TempSrc: Oral     ?SpO2: 98% 97% 96% 97%  ?Weight:    91.6 kg  ?Height:      ? ? ?Intake/Output Summary (Last 24 hours) at 05/19/2021 0723 ?Last data filed at 05/18/2021 1017 ?Gross per 24 hour  ?Intake 240 ml  ?Output --  ?Net 240 ml  ? ? ?Filed Weights  ? 05/17/21 0736 05/18/21 0457 05/19/21 0319  ?Weight: 89.7 kg 91.8 kg 91.6 kg  ? ? ?Telemetry  ?  ?SR with rates in the upper 50s to 60s bpm - Personally Reviewed ? ?ECG  ?  ?No new tracings - Personally Reviewed ? ?Physical Exam  ? ?GEN: No acute distress.   ?Neck: No JVD. ?Cardiac: RRR, no murmurs, rubs, or gallops.  ?Respiratory: Clear to auscultation bilaterally.  ?GI: Soft, nontender, non-distended.   ?MS: Trivial bilateral pretibial edema; No deformity. ?Neuro:  Alert and oriented x 3; Nonfocal.  ?Psych: Normal  affect. ? ?Labs  ?  ?Chemistry ?Recent Labs  ?Lab 05/17/21 ?4665 05/18/21 ?9935 05/19/21 ?7017  ?NA 140 140 140  ?K 4.5 4.8 5.4*  ?CL 100 104 100  ?CO2 '29 28 30  '$ ?GLUCOSE 152* 144* 143*  ?BUN 60* 63* 70*  ?CREATININE 3.23* 3.44* 4.05*  ?CALCIUM 8.6* 8.4* 8.5*  ?GFRNONAA 18* 17* 14*  ?ANIONGAP '11 8 10  '$ ? ?  ? ?Hematology ?Recent Labs  ?Lab 05/13/21 ?2037 05/18/21 ?7939 05/19/21 ?0300  ?WBC 5.5 6.0 6.4  ?RBC 3.84* 3.81* 3.91*  ?HGB 10.7* 10.6* 10.8*  ?HCT 34.3* 34.7* 35.7*  ?MCV 89.3 91.1 91.3  ?MCH 27.9 27.8 27.6  ?MCHC 31.2 30.5 30.3  ?RDW 16.0* 16.6* 16.4*  ?PLT 161 172 186  ? ? ? ?Cardiac EnzymesNo results for input(s): TROPONINI in the last 168 hours. No results for input(s): TROPIPOC in the last 168 hours.  ? ?BNP ?Recent Labs  ?Lab 05/13/21 ?2034  ?BNP 1,597.6*  ? ?  ? ?DDimer No results for input(s): DDIMER in the last 168 hours.  ? ?Radiology  ?  ? ?Cardiac Studies  ? ?Limited echo 05/18/2021: ?1. Left ventricular ejection fraction, by estimation, is 30 %. The left  ?ventricle has moderately decreased function. The left ventricle  ?demonstrates global hypokinesis.  ?  2. Right ventricular systolic function is moderately reduced. The right  ?ventricular size is mildly enlarged. There is mildly elevated pulmonary  ?artery systolic pressure. The estimated right ventricular systolic  ?pressure is 46.9 mmHg.  ? 3. Left atrial size was mildly dilated.  ? 4. The mitral valve is normal in structure. No evidence of mitral valve  ?regurgitation.  ? 5. Tricuspid valve regurgitation is mild to moderate.  ? 6. The aortic valve was not well visualized. Aortic valve regurgitation  ?is not visualized.  ? 7. The inferior vena cava is normal in size with greater than 50%  ?respiratory variability, suggesting right atrial pressure of 3 mmHg. ?__________ ? ?2D echo 05/14/2021: ?1. Left ventricular ejection fraction, by estimation, is 30 to 35%. The  ?left ventricle has moderately decreased function. The left ventricle   ?demonstrates global hypokinesis. The left ventricular internal cavity size  ?was moderately dilated. Left  ?ventricular diastolic parameters are indeterminate.  ? 2. Right ventricular systolic function is moderately reduced. The right  ?ventricular size is moderately enlarged. There is moderately elevated  ?pulmonary artery systolic pressure. The estimated right ventricular  ?systolic pressure is 62.9 mmHg.  ? 3. Left atrial size was moderately dilated.  ? 4. The mitral valve is normal in structure. Moderate mitral valve  ?regurgitation. No evidence of mitral stenosis.  ? 5. Tricuspid valve regurgitation is moderate.  ? 6. The aortic valve has been repaired/replaced. 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 Aortic valve  ?regurgitation is not visualized. No aortic stenosis is present.  ? 7. The inferior vena cava is dilated in size with <50% respiratory  ?variability, suggesting right atrial pressure of 15 mmHg.  ?__________ ? ?2D echo 09/13/2020: ?1. Left ventricular ejection fraction, by estimation, is 50 to 55%. The  ?left ventricle has low normal function. Left ventricular endocardial  ?border not optimally defined to evaluate regional wall motion. There is  ?mild left ventricular hypertrophy. Left  ?ventricular diastolic parameters are consistent with Grade II diastolic  ?dysfunction (pseudonormalization). Elevated left atrial pressure.  ? 2. Right ventricular systolic function is normal. The right ventricular  ?size is normal. There is moderately elevated pulmonary artery systolic  ?pressure.  ? 3. Left atrial size was moderately dilated.  ? 4. Right atrial size was mildly dilated.  ? 5. The mitral valve is abnormal. Mild mitral valve regurgitation. No  ?evidence of mitral stenosis.  ? 6. Tricuspid valve regurgitation is mild to moderate.  ? 7. The aortic valve has been repaired/replaced. Aortic valve  ?regurgitation is mild to moderate. There is a 23 mm Edwards  bioprosthetic  ?valve present in the aortic position. Procedure Date: 03/01/14. Aortic valve  ?mean gradient measures 13.0 mmHg.  ? 8. The inferior vena cava is dilated in size with >50% respiratory  ?variability, suggesting right atrial pressure of 8 mmHg. ?__________ ? ?Lexiscan MPI 03/18/2018: ?Normal pharmacologic myocardial perfusion stress test without ischemia or scar. ?The left ventricular ejection fraction is normal by visual estimation and Siemens calculation (59%). LVEF by QGS is likely reduced due to gating artifact (42%). ?This is a low risk study. ? ?Patient Profile  ?   ?81 y.o. male with history of persistent atrial fibrillation, severe aortic stenosis s/p AVR (2016), HF, SVT s/p ablation, DM2, and HTN admitted with acute HFrEF in the setting of atrial fibrillation. ? ?Assessment & Plan  ?  ?1. Acute HFrEF: ?-LVEF previously low normal to mildly reduced but found to be severely  reduced this admission, limited echo 05/18/2021 showed persistent cardiomyopathy, which is to be expected as it will take a couple of months for this to improve if related to Afib ?-Suspicion is that uncontrolled a-fib may have worsened cardiomyopathy ?-Appears euvolemic ?-Continue to hold torsemide with continued decline in renal function ?-Recommend leg elevation and compression stockings ?-Coreg ?-No ACEi/ARB/ARNI/MRA/SGLT2i in the setting of acute on CKD ?-Escalate GDMT as able ?-Follow up limited echo in several months time to reassess LVSF in sinus rhythm, if cardiopathy persists at that time, will need to discuss ischemic evalaution  ?-Not currently a cath candidate with declining renal function  ?-Compression socks ? ?2. Persistent Afib: ?-Maintaining sinus rhythm ?-Continue Coreg and amiodarone to complete 10 gram load of amiodarone  ?-Eliquis 2.5 mg bid (age, SCr) ? ?3. Nonobstructive CAD with elevated troponin: ?-Noted on LHC in 2015 prior to AVR ?-Minimal elevation in HS-Tn, not consistent with ACS ?-Eliquis in place  of ASA to minimize bleeding risk ?-No plans for inpatient ischemic evaluation at this time with AKI  ? ?4. Acute on CKD III with hyperkalemia: ?-Renal function continues to be above baseline and is trending up this morning  ?-Diure

## 2021-05-19 NOTE — Progress Notes (Signed)
? ?PROGRESS NOTE ? ? ? ?Timothy Riley  NKN:[id number] DOB: 1940-11-05 DOA: 05/13/2021 ?PCP: Timothy Late, MD  ? ?Assessment & Plan: ?  ?Principal Problem: ?  Acute on chronic heart failure with preserved ejection fraction (HFpEF) ([place]) ?Active Problems: ?  Acute renal failure superimposed on stage 3b chronic kidney disease ([place]) ?  Atrial fibrillation ([place]) ?  Chronic anticoagulation ?  CAD (coronary artery disease) ?  H/O paroxysmal supraventricular tachycardia s/p ablation ?  Controlled type 2 diabetes mellitus without complication, without long-term current use of insulin ([place]) ?  Chronic anemia ?  Hypoglycemia ? ? 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 Continue to hold torsemide secondary to Cr rising daily. Monitor I/Os. Cardio following and recs apprec ?  ?AKI on CKD stage IIIb: possibly secondary to cardiorenal. Cr continues to trend up.  Renal US ordered. Nephro consulted  ?  ?Hypokalemia: resolved ? ?Hyperkalemia: lokelma x 1. Repeat potassium level ordered  ?  ?Hx of paroxysmal SVT: continue on coreg  ? ?Persistent a. fib: continue on amiodarone, carvedilol, eliquis. S/p cardioversion which was successful  ?  ?DM2: likely poorly controlled. Continue on SSI w/ accuchecks  ? ? ?DVT prophylaxis: eliquis  ?Code Status:  full  ?Family Communication:  ?Disposition Plan: likely d/c back home. Pt refused PT ? ?Level of care: Progressive ? ?Status is: Inpatient ?Remains inpatient appropriate because: hyperkalemia & rising Cr. Nephro consulted ? ? ? ?Consultants:  ?Cardio  ?Nephro  ? ?Procedures:  ? ?Antimicrobials:  ? ? ?Subjective: ?Pt is agitated and frustrated.  ? ?Objective: ?Vitals:  ? 05/18/21 1535 05/18/21 2007 05/18/21 2321 05/19/21 0319  ?BP: 109/71 112/72 105/61 125/70  ?Pulse: (!) 57 (!) 58 (!) 58 (!) 58  ?Resp: '16 16 16 16  '$ ?Temp: 97.6 ?F (36.4 ?C) (!) 97.4 ?F (36.3 ?C) 97.8 ?F (36.6 ?C) 98.3 ?F (36.8 ?C)  ?TempSrc: Oral     ?SpO2: 98% 97%  96% 97%  ?Weight:    91.6 kg  ?Height:      ? ? ?Intake/Output Summary (Last 24 hours) at 05/19/2021 0723 ?Last data filed at 05/18/2021 1017 ?Gross per 24 hour  ?Intake 240 ml  ?Output --  ?Net 240 ml  ? ?Filed Weights  ? 05/17/21 0736 05/18/21 0457 05/19/21 0319  ?Weight: 89.7 kg 91.8 kg 91.6 kg  ? ? ?Examination: ? ?General exam: Appears agitated & frustrated ?Respiratory system: decreased breath sounds b/l  ?Cardiovascular system: S1 & S2+. No rubs or clicks  ?Gastrointestinal system: Abd is soft, NT,obese & normal bowel sounds. ?Central nervous system: Alert and oriented. Moves all extremities  ?Psychiatry: judgement and insight appears at baseline. Agitated and frustrated  ? ? ? ?Data Reviewed: I have personally reviewed following labs and imaging studies ? ?CBC: ?Recent Labs  ?Lab 05/13/21 ?2037 05/18/21 ?3790 05/19/21 ?2409  ?WBC 5.5 6.0 6.4  ?HGB 10.7* 10.6* 10.8*  ?HCT 34.3* 34.7* 35.7*  ?MCV 89.3 91.1 91.3  ?PLT 161 172 186  ? ?Basic Metabolic Panel: ?Recent Labs  ?Lab 05/14/21 ?7353 05/15/21 ?2992 05/16/21 ?4268 05/17/21 ?3419 05/18/21 ?6222 05/19/21 ?9798  ?NA 143 144 141 140 140 140  ?K 3.4* 3.6 3.7 4.5 4.8 5.4*  ?CL 103 102 100 100 104 100  ?CO2 31 32 '31 29 28 30  '$ ?GLUCOSE 127* 100* 99 152* 144* 143*  ?BUN 52* 51* 53* 60* 63* 70*  ?CREATININE 2.60* 2.46* 2.55* 3.23* 3.44* 4.05*  ?CALCIUM 8.5* 8.5*  8.3* 8.6* 8.4* 8.5*  ?MG 2.3 2.3  --   --   --   --   ? ?GFR: ?Estimated Creatinine Clearance: 15.5 mL/min (A) (by C-G formula based on SCr of 4.05 mg/dL (H)). ?Liver Function Tests: ?No results for input(s): AST, ALT, ALKPHOS, BILITOT, PROT, ALBUMIN in the last 168 hours. ?No results for input(s): LIPASE, AMYLASE in the last 168 hours. ?No results for input(s): AMMONIA in the last 168 hours. ?Coagulation Profile: ?Recent Labs  ?Lab 05/16/21 ?0023  ?INR 2.1*  ? ?Cardiac Enzymes: ?No results for input(s): CKTOTAL, CKMB, CKMBINDEX, TROPONINI in the last 168 hours. ?BNP (last 3 results) ?No results for input(s):  PROBNP in the last 8760 hours. ?HbA1C: ?No results for input(s): HGBA1C in the last 72 hours. ?CBG: ?Recent Labs  ?Lab 05/18/21 ?0430 05/18/21 ?0746 05/18/21 ?1239 05/18/21 ?1611 05/18/21 ?2011  ?GLUCAP 141* 143* 175* 143* 185*  ? ?Lipid Profile: ?No results for input(s): CHOL, HDL, LDLCALC, TRIG, CHOLHDL, LDLDIRECT in the last 72 hours. ?Thyroid Function Tests: ?No results for input(s): TSH, T4TOTAL, FREET4, T3FREE, THYROIDAB in the last 72 hours. ? ?Anemia Panel: ?No results for input(s): VITAMINB12, FOLATE, FERRITIN, TIBC, IRON, RETICCTPCT in the last 72 hours. ?Sepsis Labs: ?No results for input(s): PROCALCITON, LATICACIDVEN in the last 168 hours. ? ?No results found for this or any previous visit (from the past 240 hour(s)).  ? ? ? ? ? ?Radiology Studies: ?ECHOCARDIOGRAM LIMITED ? ?Result Date: 05/18/2021 ?   ECHOCARDIOGRAM LIMITED REPORT   Patient Name:   Timothy Riley Date of Exam: 05/18/2021 Medical Rec #:  [id number]       Height:       68.0 in Accession #:    [id number]      Weight:       202.3 lb Date of Birth:  15-Mar-1940      BSA:          2.054 m? Patient Age:    81 years        BP:           127/84 mmHg Patient Gender: M               HR:           63 bpm. Exam Location:  [facility] Procedure: Limited Echo, Limited Color Doppler and Cardiac Doppler Indications:     I42.9 Cardiomyopathy-unspecified  History:         Patient has prior history of Echocardiogram examinations, most                  recent 05/14/2021. Non-obstructive CAD, CKD; Risk                  Factors:Hypertension and Diabetes.  Sonographer:     Charmayne Sheer Referring Phys:  [place] Phys: Timothy Rogue MD IMPRESSIONS  1. Left ventricular ejection fraction, by estimation, is 30 %. The left ventricle has moderately decreased function. The left ventricle demonstrates global hypokinesis.  2. Right ventricular systolic function is moderately reduced. The right ventricular size is mildly enlarged. There is mildly elevated  pulmonary artery systolic pressure. The estimated right ventricular systolic pressure is 33.5 mmHg.  3. Left atrial size was mildly dilated.  4. The mitral valve is normal in structure. No evidence of mitral valve regurgitation.  5. Tricuspid valve regurgitation is mild to moderate.  6. The aortic valve was not well visualized. Aortic valve regurgitation is not visualized.  7. The inferior vena  cava is normal in size with greater than 50% respiratory variability, suggesting right atrial pressure of 3 mmHg. FINDINGS  Left Ventricle: Left ventricular ejection fraction, by estimation, is 30 %. The left ventricle has moderately decreased function. The left ventricle demonstrates global hypokinesis. The left ventricular internal cavity size was normal in size. There is no left ventricular hypertrophy. Right Ventricle: The right ventricular size is mildly enlarged. No increase in right ventricular wall thickness. Right ventricular systolic function is moderately reduced. There is mildly elevated pulmonary artery systolic pressure. The tricuspid regurgitant velocity is 2.89 m/s, and with an assumed right atrial pressure of 5 mmHg, the estimated right ventricular systolic pressure is 81.2 mmHg. Left Atrium: Left atrial size was mildly dilated. Right Atrium: Right atrial size was normal in size. Pericardium: There is no evidence of pericardial effusion. Mitral Valve: The mitral valve is normal in structure. Tricuspid Valve: The tricuspid valve is normal in structure. Tricuspid valve regurgitation is mild to moderate. Aortic Valve: The aortic valve was not well visualized. Aortic valve regurgitation is not visualized. Pulmonic Valve: The pulmonic valve was normal in structure. Pulmonic valve regurgitation is not visualized. Aorta: The aortic root is normal in size and structure. Venous: The inferior vena cava is normal in size with greater than 50% respiratory variability, suggesting right atrial pressure of 3 mmHg.  IAS/Shunts: No atrial level shunt detected by color flow Doppler. LEFT VENTRICLE PLAX 2D LVIDd:         5.18 cm LVIDs:         4.82 cm LV PW:         1.43 cm LV IVS:        1.00 cm  LV Volumes (MOD) LV vol d, MOD

## 2021-05-19 NOTE — Progress Notes (Signed)
?[facility]  ?ROUNDING NOTE  ? ?Subjective:  ? ?Timothy Riley is a 81 year old male with past medical history including CAD, hypertension, A-fib with prior cardioversion on Eliquis, and CKD 3B.  Patient presents to the emergency room with complaints of shortness of breath and increased lower extremity edema.  Patient has been admitted for CHF exacerbation ([place]) [I50.9] ?Hypervolemia, unspecified hypervolemia type [E87.70] ? ?Patient is known to our practice and was followed by Dr. Candiss Norse.  Patient currently states he does not currently follow a nephrologist due to continued decline of renal function.  Patient states that diuresis is managed by cardiologist, Dr. Rockey Situ.  Patient states he normally takes torsemide at home with potassium supplementation.  He admits to unknowingly not taking the diuretic correctly.  Once he contacted his cardiologist due to increased abdominal swelling, the mistake was discovered.  He was then instructed to take torsemide 40 mg twice daily to manage abdominal edema.  Patient states he remains short of breath and presented to the emergency department.  Patient was also awaiting follow-up appointment for scheduled cardioversion to address A-fib.  Denies nausea, vomiting, and diarrhea.  Denies chest or abdominal pain.  Denies fever, chills, cough.  Currently sitting at bedside, completed breakfast tray.  No oriented. ? ?We have been consulted to manage acute kidney injury.  Baseline creatinine appears to be 2.5 with GFR 25 on 03/30/2021.  Creatinine 4.05 today, 2.65 on admission.  Potassium elevated, 5.4 today.  Renal ultrasound negative for obstruction.  Echo shows 30% EF with left ventricular global hypokinesis. ? ? ?Objective:  ?Vital signs in last 24 hours:  ?Temp:  [97.4 ?F (36.3 ?C)-98.3 ?F (36.8 ?C)] 97.6 ?F (36.4 ?C) (04/21 1130) ?Pulse Rate:  [57-65] 65 (04/21 1130) ?Resp:  [16] 16 (04/21 1130) ?BP: (105-125)/(61-83) 111/73 (04/21 1130) ?SpO2:  [93 %-98 %] 93 % (04/21  1130) ?Weight:  [91.6 kg] 91.6 kg (04/21 0319) ? ?Weight change: 1.92 kg ?Filed Weights  ? 05/17/21 0736 05/18/21 0457 05/19/21 0319  ?Weight: 89.7 kg 91.8 kg 91.6 kg  ? ? ?Intake/Output: ?I/O last 3 completed shifts: ?In: 240 [P.O.:240] ?Out: -  ?  ?Intake/Output this shift: ? Total I/O ?In: 600 [P.O.:600] ?Out: -  ? ?Physical Exam: ?General: NAD, sitting at bedside  ?Head: Normocephalic, atraumatic. Moist oral mucosal membranes  ?Eyes: Anicteric  ?Lungs:  Mild basilar crackles, normal effort  ?Heart: Regular rate and rhythm  ?Abdomen:  Soft, nontender, distended  ?Extremities: 2+ peripheral edema.  ?Neurologic: Nonfocal, moving all four extremities  ?Skin: No lesions  ?Access: None  ? ? ?Basic Metabolic Panel: ?Recent Labs  ?Lab 05/14/21 ?5643 05/15/21 ?3295 05/16/21 ?1884 05/17/21 ?1660 05/18/21 ?6301 05/19/21 ?6010  ?NA 143 144 141 140 140 140  ?K 3.4* 3.6 3.7 4.5 4.8 5.4*  ?CL 103 102 100 100 104 100  ?CO2 31 32 '31 29 28 30  '$ ?GLUCOSE 127* 100* 99 152* 144* 143*  ?BUN 52* 51* 53* 60* 63* 70*  ?CREATININE 2.60* 2.46* 2.55* 3.23* 3.44* 4.05*  ?CALCIUM 8.5* 8.5* 8.3* 8.6* 8.4* 8.5*  ?MG 2.3 2.3  --   --   --   --   ? ? ?Liver Function Tests: ?Recent Labs  ?Lab 05/19/21 ?0332  ?ALBUMIN 3.2*  ? ?No results for input(s): LIPASE, AMYLASE in the last 168 hours. ?No results for input(s): AMMONIA in the last 168 hours. ? ?CBC: ?Recent Labs  ?Lab 05/13/21 ?2037 05/18/21 ?9323 05/19/21 ?5573  ?WBC 5.5 6.0 6.4  ?HGB 10.7* 10.6*  10.8*  ?HCT 34.3* 34.7* 35.7*  ?MCV 89.3 91.1 91.3  ?PLT 161 172 186  ? ? ?Cardiac Enzymes: ?No results for input(s): CKTOTAL, CKMB, CKMBINDEX, TROPONINI in the last 168 hours. ? ?BNP: ?Invalid input(s): POCBNP ? ?CBG: ?Recent Labs  ?Lab 05/18/21 ?1239 05/18/21 ?1611 05/18/21 ?2011 05/19/21 ?0800 05/19/21 ?1137  ?GLUCAP 175* 143* 185* 119* 173*  ? ? ?Microbiology: ?Results for orders placed or performed during the hospital encounter of 03/07/21  ?Resp Panel by RT-PCR (Flu A&B, Covid) Nasopharyngeal  Swab     Status: None  ? Collection Time: 03/07/21 11:21 PM  ? Specimen: Nasopharyngeal Swab; Nasopharyngeal(NP) swabs in vial transport medium  ?Result Value Ref Range Status  ? SARS Coronavirus 2 by RT PCR NEGATIVE NEGATIVE Final  ?  Comment: (NOTE) ?SARS-CoV-2 target nucleic acids are NOT DETECTED. ? ?The SARS-CoV-2 RNA is generally detectable in upper respiratory ?specimens during the acute phase of infection. The lowest ?concentration of SARS-CoV-2 viral copies this assay can detect is ?138 copies/mL. A negative result does not preclude SARS-Cov-2 ?infection and should not be used as the sole basis for treatment or ?other patient management decisions. A negative result may occur with  ?improper specimen collection/handling, submission of specimen other ?than nasopharyngeal swab, presence of viral mutation(s) within the ?areas targeted by this assay, and inadequate number of viral ?copies(<138 copies/mL). A negative result must be combined with ?clinical observations, patient history, and epidemiological ?information. The expected result is Negative. ? ?Fact Sheet for Patients:  ?EntrepreneurPulse.com.au ? ?Fact Sheet for Healthcare Providers:  ?IncredibleEmployment.be ? ?This test is no t yet approved or cleared by the [place] FDA and  ?has been authorized for detection and/or diagnosis of SARS-CoV-2 by ?FDA under an Emergency Use Authorization (EUA). This EUA will remain  ?in effect (meaning this test can be used) for the duration of the ?COVID-19 declaration under Section 564(b)(1) of the Act, 21 ?U.S.C.section 360bbb-3(b)(1), unless the authorization is terminated  ?or revoked sooner.  ? ? ?  ? Influenza A by PCR NEGATIVE NEGATIVE Final  ? Influenza B by PCR NEGATIVE NEGATIVE Final  ?  Comment: (NOTE) ?The Xpert Xpress SARS-CoV-2/FLU/RSV plus assay is intended as an aid ?in the diagnosis of influenza from Nasopharyngeal swab specimens and ?should not be used as a sole  basis for treatment. Nasal washings and ?aspirates are unacceptable for Xpert Xpress SARS-CoV-2/FLU/RSV ?testing. ? ?Fact Sheet for Patients: ?EntrepreneurPulse.com.au ? ?Fact Sheet for Healthcare Providers: ?IncredibleEmployment.be ? ?This test is not yet approved or cleared by the [place] FDA and ?has been authorized for detection and/or diagnosis of SARS-CoV-2 by ?FDA under an Emergency Use Authorization (EUA). This EUA will remain ?in effect (meaning this test can be used) for the duration of the ?COVID-19 declaration under Section 564(b)(1) of the Act, 21 U.S.C. ?section 360bbb-3(b)(1), unless the authorization is terminated or ?revoked. ? ?Performed at [facility], [place], ?[place] [address] ?  ?Culture, blood (routine x 2)     Status: None  ? Collection Time: 03/08/21  1:07 AM  ? Specimen: BLOOD  ?Result Value Ref Range Status  ? Specimen Description BLOOD RIGHT HAND  Final  ? Special Requests   Final  ?  BOTTLES DRAWN AEROBIC AND ANAEROBIC Blood Culture results may not be optimal due to an inadequate volume of blood received in culture bottles  ? Culture   Final  ?  NO GROWTH 5 DAYS ?Performed at [facility], [address], [place], [place] [address] ?  ?  Report Status 03/13/2021 FINAL  Final  ?Urine Culture     Status: Abnormal  ? Collection Time: 03/08/21  1:17 AM  ? Specimen: Urine, Clean Catch  ?Result Value Ref Range Status  ? Specimen Description   Final  ?  URINE, CLEAN CATCH ?Performed at [facility], [address], [place], [place] [address] ?  ? Special Requests   Final  ?  NONE ?Performed at [facility], [address] [place], [place] [address] ?  ? Culture (A)  Final  ?  20,000 COLONIES/mL STREPTOCOCCUS AGALACTIAE ?TESTING AGAINST S. AGALACTIAE NOT ROUTINELY PERFORMED DUE TO PREDICTABILITY OF AMP/PEN/VAN SUSCEPTIBILITY. ?Performed at [facility], [place] [address], [place],   [address] ?  ? Report Status 03/09/2021 FINAL  Final  ?Culture, blood (routine x 2)     Status: None  ? Collection Time: 03/08/21  2:02 AM  ? Specimen: BLOOD  ?Result Value Ref Range Status  ? Specimen Desc

## 2021-05-19 NOTE — Care Management Important Message (Signed)
Important Message ? ?Patient Details  ?Name: Timothy Riley [facility] ?MRN: [id number] ?Date of Birth: 1940-11-29 ? ? ?Medicare Important Message Given:  Yes ? ?Patient asleep upon time of visit.  Copy of Medicare IM left on counter for reference. ? ? ?[name] ?05/19/2021, 3:21 PM ?

## 2021-05-19 NOTE — TOC Progression Note (Signed)
Transition of Care (TOC) - Progression Note  ? ? ?Patient Details  ?Name: Myron Stankovich [facility] ?MRN: [id number] ?Date of Birth: 12/14/1940 ? ?Transition of Care (TOC) CM/SW Contact  ?Laurena Slimmer, RN ?Phone Number: ?05/19/2021, 2:24 PM ? ?Clinical Narrative:    ?Spoke with patient at bedside this am regarding choice for outpatient therapy. Patient decided he would would like outpatient therapy via [facility]. Patient has continued workup at this time for worsening renal function. Will reassess for further needs. ? ? ?  ?  ? ?Expected Discharge Plan and Services ?  ?  ?  ?  ?  ?                ?  ?  ?  ?  ?  ?  ?  ?  ?  ?  ? ? ?Social Determinants of Health (SDOH) Interventions ?  ? ?Readmission Risk Interventions ?   ? View : No data to display.  ?  ?  ?  ? ? ?

## 2021-05-20 ENCOUNTER — Inpatient Hospital Stay: Payer: Medicare Other

## 2021-05-20 DIAGNOSIS — N1832 Chronic kidney disease, stage 3b: Secondary | ICD-10-CM | POA: Diagnosis not present

## 2021-05-20 DIAGNOSIS — I4819 Other persistent atrial fibrillation: Secondary | ICD-10-CM | POA: Diagnosis not present

## 2021-05-20 DIAGNOSIS — N170 Acute kidney failure with tubular necrosis: Secondary | ICD-10-CM | POA: Diagnosis not present

## 2021-05-20 DIAGNOSIS — Z7901 Long term (current) use of anticoagulants: Secondary | ICD-10-CM | POA: Diagnosis not present

## 2021-05-20 DIAGNOSIS — I5033 Acute on chronic diastolic (congestive) heart failure: Secondary | ICD-10-CM | POA: Diagnosis not present

## 2021-05-20 DIAGNOSIS — Z952 Presence of prosthetic heart valve: Secondary | ICD-10-CM

## 2021-05-20 LAB — GLUCOSE, CAPILLARY
Glucose-Capillary: 90 mg/dL (ref 70–99)
Glucose-Capillary: 93 mg/dL (ref 70–99)
Glucose-Capillary: 115 mg/dL — ABNORMAL HIGH (ref 70–99)
Glucose-Capillary: 103 mg/dL — ABNORMAL HIGH (ref 70–99)
Glucose-Capillary: 136 mg/dL — ABNORMAL HIGH (ref 70–99)

## 2021-05-20 LAB — CBC
HCT: 34.8 % — ABNORMAL LOW (ref 39.0–52.0)
Hemoglobin: 10.4 g/dL — ABNORMAL LOW (ref 13.0–17.0)
MCH: 27.3 pg (ref 26.0–34.0)
MCHC: 29.9 g/dL — ABNORMAL LOW (ref 30.0–36.0)
MCV: 91.3 fL (ref 80.0–100.0)
Platelets: 195 10*3/uL (ref 150–400)
RBC: 3.81 MIL/uL — ABNORMAL LOW (ref 4.22–5.81)
RDW: 17.1 % — ABNORMAL HIGH (ref 11.5–15.5)
WBC: 6.4 10*3/uL (ref 4.0–10.5)
nRBC: 0.3 % — ABNORMAL HIGH (ref 0.0–0.2)

## 2021-05-20 LAB — BASIC METABOLIC PANEL
Anion gap: 12 (ref 5–15)
BUN: 75 mg/dL — ABNORMAL HIGH (ref 8–23)
CO2: 27 mmol/L (ref 22–32)
Calcium: 8.5 mg/dL — ABNORMAL LOW (ref 8.9–10.3)
Chloride: 100 mmol/L (ref 98–111)
Creatinine, Ser: 4.55 mg/dL — ABNORMAL HIGH (ref 0.61–1.24)
GFR, Estimated: 12 mL/min — ABNORMAL LOW (ref >60)
Glucose, Bld: 89 mg/dL (ref 70–99)
Potassium: 5 mmol/L (ref 3.5–5.1)
Sodium: 139 mmol/L (ref 135–145)

## 2021-05-20 MED ORDER — NEPRO/CARBSTEADY PO LIQD
237.0000 mL | Freq: Three times a day (TID) | ORAL | Status: DC
  Administered 2021-05-20 – 2021-05-29 (×19): 237 mL via ORAL

## 2021-05-20 MED ORDER — POLYETHYLENE GLYCOL 3350 17 G PO PACK
17.0000 g | PACK | Freq: Every day | ORAL | Status: DC
  Administered 2021-05-20 – 2021-05-29 (×8): 17 g via ORAL
  Filled 2021-05-20 (×9): qty 1

## 2021-05-20 MED ORDER — OXYCODONE-ACETAMINOPHEN 5-325 MG PO TABS
1.0000 | ORAL_TABLET | Freq: Four times a day (QID) | ORAL | Status: DC | PRN
  Administered 2021-05-24 – 2021-05-26 (×3): 1 via ORAL
  Filled 2021-05-20 (×3): qty 1

## 2021-05-20 MED ORDER — MORPHINE SULFATE (PF) 2 MG/ML IV SOLN: 1.0000 mg | INTRAVENOUS | Status: DC | PRN

## 2021-05-20 NOTE — Progress Notes (Signed)
Mobility Specialist - Progress Note ? ? ? 05/20/21 1608  ?Therapy Vitals  ?Pulse Rate 61  ?Mobility  ?Activity Ambulated independently in hallway;Stood at bedside  ?Level of Assistance Independent after set-up  ?Assistive Device Front wheel walker  ?Distance Ambulated (ft) 180 ft  ?Activity Response Tolerated well  ?$Mobility charge 1 Mobility  ? ? ?Pre-mobility: 59 HR, 96% SpO2 ?During mobility: 60 HR, 93% SpO2 ? ?Pt sitting in recliner upon arrival using RA. Completes STS and ambulation indep -- voiced LE weakness after 19f and sharp right back pain. Tolerated session well and returns to chair with needs in reach. ? ?MMerrily Brittle?Mobility Specialist ?05/20/21, 4:45 PM ? ? ? ? ? ?

## 2021-05-20 NOTE — Progress Notes (Addendum)
?[facility]  ?ROUNDING NOTE  ? ?Subjective:  ? ?Timothy Riley is a 81 year old male with past medical history including CAD, hypertension, A-fib with prior cardioversion on Eliquis, and CKD 3B.  Patient presents to the emergency room with complaints of shortness of breath and increased lower extremity edema.  Patient has been admitted for CHF exacerbation ([place]) [I50.9] ?Hypervolemia, unspecified hypervolemia type [E87.70] ? ?Patient is known to our practice and was followed by Dr. Candiss Norse.  Patient currently states he does not currently follow a nephrologist due to continued decline of renal function.  Patient states that diuresis is managed by cardiologist, Dr. Rockey Situ.  ? ?Update ?Patient seen resting in bed, states little urine output overnight ?Poor oral intake reported ?Lower extremity edema remains, reports increased abdominal swelling ? ?Creatinine 4.55 ?Urine output 220 mL preceding 24 hours ? ?Objective:  ?Vital signs in last 24 hours:  ?Temp:  [97 ?F (36.1 ?C)-97.6 ?F (36.4 ?C)] 97.3 ?F (36.3 ?C) (04/22 0757) ?Pulse Rate:  [56-65] 61 (04/22 0757) ?Resp:  [16-17] 17 (04/22 0757) ?BP: (110-141)/(54-79) 141/79 (04/22 0757) ?SpO2:  [93 %-99 %] 98 % (04/22 0757) ?Weight:  [93 kg] 93 kg (04/22 0323) ? ?Weight change: 1.367 kg ?Filed Weights  ? 05/18/21 0457 05/19/21 0319 05/20/21 0323  ?Weight: 91.8 kg 91.6 kg 93 kg  ? ? ?Intake/Output: ?I/O last 3 completed shifts: ?In: 1180 [P.O.:1180] ?Out: 220 [Urine:220] ?  ?Intake/Output this shift: ? No intake/output data recorded. ? ?Physical Exam: ?General: NAD  ?Head: Normocephalic, atraumatic. Moist oral mucosal membranes  ?Eyes: Anicteric  ?Lungs:  Mild basilar crackles, normal effort  ?Heart: Regular rate and rhythm  ?Abdomen:  Soft, nontender, distended  ?Extremities: 2+ peripheral edema.  ?Neurologic: Nonfocal, moving all four extremities  ?Skin: No lesions  ?Access: None  ? ? ?Basic Metabolic Panel: ?Recent Labs  ?Lab 05/14/21 ?5784 05/15/21 ?6962  05/16/21 ?9528 05/17/21 ?4132 05/18/21 ?4401 05/19/21 ?0272 05/19/21 ?1452 05/20/21 ?0353  ?NA 143 144 141 140 140 140  --  139  ?K 3.4* 3.6 3.7 4.5 4.8 5.4* 5.4* 5.0  ?CL 103 102 100 100 104 100  --  100  ?CO2 31 32 '31 29 28 30  '$ --  27  ?GLUCOSE 127* 100* 99 152* 144* 143*  --  89  ?BUN 52* 51* 53* 60* 63* 70*  --  75*  ?CREATININE 2.60* 2.46* 2.55* 3.23* 3.44* 4.05*  --  4.55*  ?CALCIUM 8.5* 8.5* 8.3* 8.6* 8.4* 8.5*  --  8.5*  ?MG 2.3 2.3  --   --   --   --   --   --   ? ? ? ?Liver Function Tests: ?Recent Labs  ?Lab 05/19/21 ?0332  ?ALBUMIN 3.2*  ? ? ?No results for input(s): LIPASE, AMYLASE in the last 168 hours. ?No results for input(s): AMMONIA in the last 168 hours. ? ?CBC: ?Recent Labs  ?Lab 05/13/21 ?2037 05/18/21 ?5366 05/19/21 ?4403 05/20/21 ?0353  ?WBC 5.5 6.0 6.4 6.4  ?HGB 10.7* 10.6* 10.8* 10.4*  ?HCT 34.3* 34.7* 35.7* 34.8*  ?MCV 89.3 91.1 91.3 91.3  ?PLT 161 172 186 195  ? ? ? ?Cardiac Enzymes: ?No results for input(s): CKTOTAL, CKMB, CKMBINDEX, TROPONINI in the last 168 hours. ? ?BNP: ?Invalid input(s): POCBNP ? ?CBG: ?Recent Labs  ?Lab 05/19/21 ?1137 05/19/21 ?1607 05/19/21 ?1944 05/20/21 ?0325 05/20/21 ?0801  ?GLUCAP 173* 185* 139* 90 93  ? ? ? ?Microbiology: ?Results for orders placed or performed during the hospital encounter of 03/07/21  ?Resp  Panel by RT-PCR (Flu A&B, Covid) Nasopharyngeal Swab     Status: None  ? Collection Time: 03/07/21 11:21 PM  ? Specimen: Nasopharyngeal Swab; Nasopharyngeal(NP) swabs in vial transport medium  ?Result Value Ref Range Status  ? SARS Coronavirus 2 by RT PCR NEGATIVE NEGATIVE Final  ?  Comment: (NOTE) ?SARS-CoV-2 target nucleic acids are NOT DETECTED. ? ?The SARS-CoV-2 RNA is generally detectable in upper respiratory ?specimens during the acute phase of infection. The lowest ?concentration of SARS-CoV-2 viral copies this assay can detect is ?138 copies/mL. A negative result does not preclude SARS-Cov-2 ?infection and should not be used as the sole basis for  treatment or ?other patient management decisions. A negative result may occur with  ?improper specimen collection/handling, submission of specimen other ?than nasopharyngeal swab, presence of viral mutation(s) within the ?areas targeted by this assay, and inadequate number of viral ?copies(<138 copies/mL). A negative result must be combined with ?clinical observations, patient history, and epidemiological ?information. The expected result is Negative. ? ?Fact Sheet for Patients:  ?EntrepreneurPulse.com.au ? ?Fact Sheet for Healthcare Providers:  ?IncredibleEmployment.be ? ?This test is no t yet approved or cleared by the [place] FDA and  ?has been authorized for detection and/or diagnosis of SARS-CoV-2 by ?FDA under an Emergency Use Authorization (EUA). This EUA will remain  ?in effect (meaning this test can be used) for the duration of the ?COVID-19 declaration under Section 564(b)(1) of the Act, 21 ?U.S.C.section 360bbb-3(b)(1), unless the authorization is terminated  ?or revoked sooner.  ? ? ?  ? Influenza A by PCR NEGATIVE NEGATIVE Final  ? Influenza B by PCR NEGATIVE NEGATIVE Final  ?  Comment: (NOTE) ?The Xpert Xpress SARS-CoV-2/FLU/RSV plus assay is intended as an aid ?in the diagnosis of influenza from Nasopharyngeal swab specimens and ?should not be used as a sole basis for treatment. Nasal washings and ?aspirates are unacceptable for Xpert Xpress SARS-CoV-2/FLU/RSV ?testing. ? ?Fact Sheet for Patients: ?EntrepreneurPulse.com.au ? ?Fact Sheet for Healthcare Providers: ?IncredibleEmployment.be ? ?This test is not yet approved or cleared by the [place] FDA and ?has been authorized for detection and/or diagnosis of SARS-CoV-2 by ?FDA under an Emergency Use Authorization (EUA). This EUA will remain ?in effect (meaning this test can be used) for the duration of the ?COVID-19 declaration under Section 564(b)(1) of the Act, 21  U.S.C. ?section 360bbb-3(b)(1), unless the authorization is terminated or ?revoked. ? ?Performed at [facility], [place], ?[place] [address] ?  ?Culture, blood (routine x 2)     Status: None  ? Collection Time: 03/08/21  1:07 AM  ? Specimen: BLOOD  ?Result Value Ref Range Status  ? Specimen Description BLOOD RIGHT HAND  Final  ? Special Requests   Final  ?  BOTTLES DRAWN AEROBIC AND ANAEROBIC Blood Culture results may not be optimal due to an inadequate volume of blood received in culture bottles  ? Culture   Final  ?  NO GROWTH 5 DAYS ?Performed at [facility], [address] [place], [place] [address] ?  ? Report Status 03/13/2021 FINAL  Final  ?Urine Culture     Status: Abnormal  ? Collection Time: 03/08/21  1:17 AM  ? Specimen: Urine, Clean Catch  ?Result Value Ref Range Status  ? Specimen Description   Final  ?  URINE, CLEAN CATCH ?Performed at [facility], [address], [place], [place] [address] ?  ? Special Requests   Final  ?  NONE ?Performed at [facility],  [place], [place] [address] ?  ? Culture (A)  Final  ?  20,000 COLONIES/mL STREPTOCOCCUS AGALACTIAE ?TESTING AGAINST S. AGALACTIAE NOT ROUTINELY PERFORMED DUE TO PREDICTABILITY OF AMP/PEN/VAN SUSCEPTIBILITY. ?Performed at [facility], [place] [address] [place], [place] [address] ?  ? Report Status 03/09/2021 FINAL  Final  ?Culture, blood (routine x 2)     Status: None  ? Collection Time: 03/08/21  2:02 AM  ? Specimen: BLOOD  ?Result Value Ref Range Status  ? Specimen Description BLOOD RIGHT HAND  Final  ? Special Requests   Final  ?  BOTTLES DRAWN AEROBIC AND ANAEROBIC Blood Culture adequate volume  ? Culture   Final  ?  NO GROWTH 5 DAYS ?Performed at [facility], [address], [place], [place] [address] ?  ? Report Status 03/13/2021 FINAL  Final  ? ? ?Coagulation Studies: ?No results for input(s): LABPROT, INR in the last 72 hours. ? ?Urinalysis: ?No results  for input(s): COLORURINE, LABSPEC, [place], GLUCOSEU, HGBUR, BILIRUBINUR, KETONESUR, PROTEINUR, UROBILINOGEN, NITRITE, LEUKOCYTESUR in the last 72 hours. ? ?Invalid input(s): APPERANCEUR  ? ? ?Imaging: ?[place] RE

## 2021-05-20 NOTE — Progress Notes (Signed)
A consult was placed again today to IV Therapy;  pt is currently still not getting any IV medications or fluids;  secure chat sent to RN;  will hold off on placing an iv at this time , keeping vein preservation in mind,  but asked that another consult be placed,  if the pt will be started on fluids or medications.   ?

## 2021-05-20 NOTE — Progress Notes (Signed)
Tele called stating that patient was alarming for apnea. Went and checked on patient and he is sleeping and breathing normally. Rate, rhythm and depth WNL. ?

## 2021-05-20 NOTE — Progress Notes (Signed)
Tech reported that patient urinated 100 mls. When bladder scanned by tech, he had 245 mls left in bladder. Not an appropriate amount for in/out catheter. ?

## 2021-05-20 NOTE — Progress Notes (Addendum)
? ?PROGRESS NOTE ? ? ? ?Timothy Riley  VOJ:[id number] DOB: 08-Oct-1940 DOA: 05/13/2021 ?PCP: Derinda Late, MD  ? ?Assessment & Plan: ?  ?Principal Problem: ?  Acute on chronic heart failure with preserved ejection fraction (HFpEF) ([place]) ?Active Problems: ?  Acute renal failure superimposed on stage 3b chronic kidney disease ([place]) ?  Atrial fibrillation ([place]) ?  Chronic anticoagulation ?  CAD (coronary artery disease) ?  H/O paroxysmal supraventricular tachycardia s/p ablation ?  Controlled type 2 diabetes mellitus without complication, without long-term current use of insulin ([place]) ?  Chronic anemia ?  Hypoglycemia ? ? 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 Continue to hold torsemide secondary to worsening Cr. Monitor I/Os. Cardio following and recs apprec ?  ?AKI on CKD stage IIIb: possibly secondary to cardiorenal. Cr is trending up daily. Renal US shows unchanged right lower pole cyst, no hydronephrosis. Nephro following and recs apprec  ?  ?Hypokalemia: resolved ? ?Hyperkalemia: WNL today  ?  ?Hx of paroxysmal SVT: continue on amio, coreg ? ?Persistent a. fib: continue on coreg, amio & eliquis. S/p cardioversion which was successful  ?  ?DM2: pretty well controlled, HbA1c 6.4. Continue on SSI w/ accuchecks  ? ? ?DVT prophylaxis: eliquis  ?Code Status:  full  ?Family Communication: discussed pt's care w/ pt's son, [name], and answered his questions ?Disposition Plan: likely d/c back home. Pt refused PT ? ?Level of care: Progressive ? ?Status is: Inpatient ?Remains inpatient appropriate because: hyperkalemia & rising Cr. Nephro consulted ? ? ? ?Consultants:  ?Cardio  ?Nephro  ? ?Procedures:  ? ?Antimicrobials:  ? ? ?Subjective: ?Pt c/o abd pain  ? ?Objective: ?Vitals:  ? 05/19/21 1954 05/20/21 0030 05/20/21 0320 05/20/21 0323  ?BP: (!) 110/56 112/70 126/75   ?Pulse: (!) 58 (!) 56 (!) 57   ?Resp: '16 16 17   '$ ?Temp: (!) 97 ?F (36.1 ?C) (!) 97.5 ?F (36.4  ?C) (!) 97.5 ?F (36.4 ?C)   ?TempSrc: Axillary Axillary    ?SpO2: 97% 98% 99%   ?Weight:    93 kg  ?Height:      ? ? ?Intake/Output Summary (Last 24 hours) at 05/20/2021 0727 ?Last data filed at 05/20/2021 0245 ?Gross per 24 hour  ?Intake 1180 ml  ?Output 220 ml  ?Net 960 ml  ? ?Filed Weights  ? 05/18/21 0457 05/19/21 0319 05/20/21 0323  ?Weight: 91.8 kg 91.6 kg 93 kg  ? ? ?Examination: ? ?General exam: Appears calm but uncomfortable  ?Respiratory system: diminished breath sounds b/l ?Cardiovascular system: S1/S2+. No rubs or clicks ?Gastrointestinal system: Abd is soft, tenderness to palpation & hypoactive bowel sounds  ?Central nervous system: Alert and oriented. Moves all extremities  ?Psychiatry: judgement and insight appears at baseline. Flat mood and affect ? ? ? ?Data Reviewed: I have personally reviewed following labs and imaging studies ? ?CBC: ?Recent Labs  ?Lab 05/13/21 ?2037 05/18/21 ?9937 05/19/21 ?1696 05/20/21 ?0353  ?WBC 5.5 6.0 6.4 6.4  ?HGB 10.7* 10.6* 10.8* 10.4*  ?HCT 34.3* 34.7* 35.7* 34.8*  ?MCV 89.3 91.1 91.3 91.3  ?PLT 161 172 186 195  ? ?Basic Metabolic Panel: ?Recent Labs  ?Lab 05/14/21 ?7893 05/15/21 ?8101 05/16/21 ?7510 05/17/21 ?2585 05/18/21 ?2778 05/19/21 ?2423 05/19/21 ?1452 05/20/21 ?0353  ?NA 143 144 141 140 140 140  --  139  ?K 3.4* 3.6 3.7 4.5 4.8 5.4* 5.4* 5.0  ?CL 103 102 100 100 104 100  --  100  ?  CO2 31 32 '31 29 28 30  '$ --  27  ?GLUCOSE 127* 100* 99 152* 144* 143*  --  89  ?BUN 52* 51* 53* 60* 63* 70*  --  75*  ?CREATININE 2.60* 2.46* 2.55* 3.23* 3.44* 4.05*  --  4.55*  ?CALCIUM 8.5* 8.5* 8.3* 8.6* 8.4* 8.5*  --  8.5*  ?MG 2.3 2.3  --   --   --   --   --   --   ? ?GFR: ?Estimated Creatinine Clearance: 13.8 mL/min (A) (by C-G formula based on SCr of 4.55 mg/dL (H)). ?Liver Function Tests: ?Recent Labs  ?Lab 05/19/21 ?0332  ?ALBUMIN 3.2*  ? ?No results for input(s): LIPASE, AMYLASE in the last 168 hours. ?No results for input(s): AMMONIA in the last 168 hours. ?Coagulation  Profile: ?Recent Labs  ?Lab 05/16/21 ?0023  ?INR 2.1*  ? ?Cardiac Enzymes: ?No results for input(s): CKTOTAL, CKMB, CKMBINDEX, TROPONINI in the last 168 hours. ?BNP (last 3 results) ?No results for input(s): PROBNP in the last 8760 hours. ?HbA1C: ?No results for input(s): HGBA1C in the last 72 hours. ?CBG: ?Recent Labs  ?Lab 05/19/21 ?0800 05/19/21 ?1137 05/19/21 ?1607 05/19/21 ?1944 05/20/21 ?0325  ?GLUCAP 119* [place] ?Lipid Profile: ?No results for input(s): CHOL, HDL, LDLCALC, TRIG, CHOLHDL, LDLDIRECT in the last 72 hours. ?Thyroid Function Tests: ?No results for input(s): TSH, T4TOTAL, FREET4, T3FREE, THYROIDAB in the last 72 hours. ? ?Anemia Panel: ?No results for input(s): VITAMINB12, FOLATE, FERRITIN, TIBC, IRON, RETICCTPCT in the last 72 hours. ?Sepsis Labs: ?No results for input(s): PROCALCITON, LATICACIDVEN in the last 168 hours. ? ?No results found for this or any previous visit (from the past 240 hour(s)).  ? ? ? ? ? ?Radiology Studies: ?US RENAL ? ?Result Date: 05/19/2021 ?CLINICAL DATA:  Chronic kidney disease. Worsening creatinine. Decreased urine output. EXAM: RENAL / URINARY TRACT ULTRASOUND COMPLETE COMPARISON:  CT abdomen and pelvis 03/07/2021; ultrasound renal 01/26/2019 FINDINGS: Right Kidney: Renal measurements: 11.0 x 5.1 x 6.4 cm = volume: 187 mL. Echogenicity within normal limits. There is an avascular hypoechoic cyst measuring up to 1.6 cm, measured up to 1.9 cm on 01/26/2019 ultrasound and demonstrating fluid density on 03/07/2021 CT. No hydronephrosis. Left Kidney: Renal measurements: 10.9 x 4.9 x 4.1 cm = volume: 115 mL mL. Echogenicity within normal limits. No mass or hydronephrosis visualized. Bladder: Only minimally distended, limiting evaluation. Other: Incidental note of ascites.  Ascites was also seen on prior CT. IMPRESSION:: IMPRESSION: 1. Unchanged right lower pole cyst measuring up to 1.6 cm. 2. No hydronephrosis. Electronically Signed   By: Yvonne Kendall M.D.    On: 05/19/2021 11:26  ? ?ECHOCARDIOGRAM LIMITED ? ?Result Date: 05/18/2021 ?   ECHOCARDIOGRAM LIMITED REPORT   Patient Name:   DRAGON THRUSH Date of Exam: 05/18/2021 Medical Rec #:  [id number]       Height:       68.0 in Accession #:    [id number]      Weight:       202.3 lb Date of Birth:  [birth year]      BSA:          2.054 m? Patient Age:    46 years        BP:           127/84 mmHg Patient Gender: M               HR:  63 bpm. Exam Location:  [facility] Procedure: Limited Echo, Limited Color Doppler and Cardiac Doppler Indications:     I42.9 Cardiomyopathy-unspecified  History:         Patient has prior history of Echocardiogram examinations, most                  recent 05/14/2021. Non-obstructive CAD, CKD; Risk                  Factors:Hypertension and Diabetes.  Sonographer:     Charmayne Sheer Referring Phys:  [place] Phys: Ida Rogue MD IMPRESSIONS  1. Left ventricular ejection fraction, by estimation, is 30 %. The left ventricle has moderately decreased function. The left ventricle demonstrates global hypokinesis.  2. Right ventricular systolic function is moderately reduced. The right ventricular size is mildly enlarged. There is mildly elevated pulmonary artery systolic pressure. The estimated right ventricular systolic pressure is 25.3 mmHg.  3. Left atrial size was mildly dilated.  4. The mitral valve is normal in structure. No evidence of mitral valve regurgitation.  5. Tricuspid valve regurgitation is mild to moderate.  6. The aortic valve was not well visualized. Aortic valve regurgitation is not visualized.  7. The inferior vena cava is normal in size with greater than 50% respiratory variability, suggesting right atrial pressure of 3 mmHg. FINDINGS  Left Ventricle: Left ventricular ejection fraction, by estimation, is 30 %. The left ventricle has moderately decreased function. The left ventricle demonstrates global hypokinesis. The left ventricular internal cavity size was  normal in size. There is no left ventricular hypertrophy. Right Ventricle: The right ventricular size is mildly enlarged. No increase in right ventricular wall thickness. Right ventricular systolic function is m

## 2021-05-20 NOTE — Progress Notes (Signed)
? ? ?Progress Note ? ?Patient Name: Timothy Riley ?Date of Encounter: 05/20/2021 ? ?Primary Cardiologist: Rockey Situ ? ?Subjective  ? ?Maintaining sinus rhythm following DCCV 05/17/2021.  Renal function continues to decline. No chest pain, dyspnea, palpitations, dizziness, presyncope, or syncope. Feels like his abdominal in bloated and warm. Minimal UOP overnight. Reports he is not eating or drinking much.  ? ?Inpatient Medications  ?  ?Scheduled Meds: ? amiodarone  400 mg Oral BID  ? apixaban  2.5 mg Oral BID  ? carvedilol  6.25 mg Oral BID WC  ? docusate sodium  200 mg Oral BID  ? insulin aspart  0-9 Units Subcutaneous TID WC  ? rosuvastatin  10 mg Oral Daily  ? ?Continuous Infusions: ? sodium chloride Stopped (05/17/21 1610)  ? sodium chloride    ? ?PRN Meds: ?acetaminophen **OR** acetaminophen, loperamide, melatonin, ondansetron **OR** ondansetron (ZOFRAN) IV, polyethylene glycol  ? ?Vital Signs  ?  ?Vitals:  ? 05/19/21 1954 05/20/21 0030 05/20/21 0320 05/20/21 0323  ?BP: (!) 110/56 112/70 126/75   ?Pulse: (!) 58 (!) 56 (!) 57   ?Resp: '16 16 17   '$ ?Temp: (!) 97 ?F (36.1 ?C) (!) 97.5 ?F (36.4 ?C) (!) 97.5 ?F (36.4 ?C)   ?TempSrc: Axillary Axillary    ?SpO2: 97% 98% 99%   ?Weight:    93 kg  ?Height:      ? ? ?Intake/Output Summary (Last 24 hours) at 05/20/2021 8242 ?Last data filed at 05/20/2021 0245 ?Gross per 24 hour  ?Intake 1180 ml  ?Output 220 ml  ?Net 960 ml  ? ? ?Filed Weights  ? 05/18/21 0457 05/19/21 0319 05/20/21 0323  ?Weight: 91.8 kg 91.6 kg 93 kg  ? ? ?Telemetry  ?  ?SR with rates in the upper 50s to 60s bpm - Personally Reviewed ? ?ECG  ?  ?No new tracings - Personally Reviewed ? ?Physical Exam  ? ?GEN: No acute distress.   ?Neck: No JVD. ?Cardiac: RRR, no murmurs, rubs, or gallops.  ?Respiratory: Clear to auscultation bilaterally.  ?GI: Soft, mild diffuse tenderness, bloated.   ?MS: Trivial bilateral pretibial edema; No deformity. ?Neuro:  Alert and oriented x 3; Nonfocal.  ?Psych: Normal affect. ? ?Labs   ?  ?Chemistry ?Recent Labs  ?Lab 05/18/21 ?3536 05/19/21 ?0332 05/19/21 ?1452 05/20/21 ?0353  ?NA 140 140  --  139  ?K 4.8 5.4* 5.4* 5.0  ?CL 104 100  --  100  ?CO2 28 30  --  27  ?GLUCOSE 144* 143*  --  89  ?BUN 63* 70*  --  75*  ?CREATININE 3.44* 4.05*  --  4.55*  ?CALCIUM 8.4* 8.5*  --  8.5*  ?ALBUMIN  --  3.2*  --   --   ?GFRNONAA 17* 14*  --  12*  ?ANIONGAP 8 10  --  12  ? ?  ? ?Hematology ?Recent Labs  ?Lab 05/18/21 ?1443 05/19/21 ?0332 05/20/21 ?0353  ?WBC 6.0 6.4 6.4  ?RBC 3.81* 3.91* 3.81*  ?HGB 10.6* 10.8* 10.4*  ?HCT 34.7* 35.7* 34.8*  ?MCV 91.1 91.3 91.3  ?MCH 27.8 27.6 27.3  ?MCHC 30.5 30.3 29.9*  ?RDW 16.6* 16.4* 17.1*  ?PLT 172 186 195  ? ? ? ?Cardiac EnzymesNo results for input(s): TROPONINI in the last 168 hours. No results for input(s): TROPIPOC in the last 168 hours.  ? ?BNP ?Recent Labs  ?Lab 05/13/21 ?2034  ?BNP 1,597.6*  ? ?  ? ?DDimer No results for input(s): DDIMER in the last 168 hours.  ? ?  Radiology  ?  ? ?Cardiac Studies  ? ?Limited echo 05/18/2021: ?1. Left ventricular ejection fraction, by estimation, is 30 %. The left  ?ventricle has moderately decreased function. The left ventricle  ?demonstrates global hypokinesis.  ? 2. Right ventricular systolic function is moderately reduced. The right  ?ventricular size is mildly enlarged. There is mildly elevated pulmonary  ?artery systolic pressure. The estimated right ventricular systolic  ?pressure is 86.7 mmHg.  ? 3. Left atrial size was mildly dilated.  ? 4. The mitral valve is normal in structure. No evidence of mitral valve  ?regurgitation.  ? 5. Tricuspid valve regurgitation is mild to moderate.  ? 6. The aortic valve was not well visualized. Aortic valve regurgitation  ?is not visualized.  ? 7. The inferior vena cava is normal in size with greater than 50%  ?respiratory variability, suggesting right atrial pressure of 3 mmHg. ?__________ ? ?2D echo 05/14/2021: ?1. Left ventricular ejection fraction, by estimation, is 30 to 35%. The  ?left  ventricle has moderately decreased function. The left ventricle  ?demonstrates global hypokinesis. The left ventricular internal cavity size  ?was moderately dilated. Left  ?ventricular diastolic parameters are indeterminate.  ? 2. Right ventricular systolic function is moderately reduced. The right  ?ventricular size is moderately enlarged. There is moderately elevated  ?pulmonary artery systolic pressure. The estimated right ventricular  ?systolic pressure is 61.9 mmHg.  ? 3. Left atrial size was moderately dilated.  ? 4. The mitral valve is normal in structure. Moderate mitral valve  ?regurgitation. No evidence of mitral stenosis.  ? 5. Tricuspid valve regurgitation is moderate.  ? 6. The aortic valve has been repaired/replaced. 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 Aortic valve  ?regurgitation is not visualized. No aortic stenosis is present.  ? 7. The inferior vena cava is dilated in size with <50% respiratory  ?variability, suggesting right atrial pressure of 15 mmHg.  ?__________ ? ?2D echo 09/13/2020: ?1. Left ventricular ejection fraction, by estimation, is 50 to 55%. The  ?left ventricle has low normal function. Left ventricular endocardial  ?border not optimally defined to evaluate regional wall motion. There is  ?mild left ventricular hypertrophy. Left  ?ventricular diastolic parameters are consistent with Grade II diastolic  ?dysfunction (pseudonormalization). Elevated left atrial pressure.  ? 2. Right ventricular systolic function is normal. The right ventricular  ?size is normal. There is moderately elevated pulmonary artery systolic  ?pressure.  ? 3. Left atrial size was moderately dilated.  ? 4. Right atrial size was mildly dilated.  ? 5. The mitral valve is abnormal. Mild mitral valve regurgitation. No  ?evidence of mitral stenosis.  ? 6. Tricuspid valve regurgitation is mild to moderate.  ? 7. The aortic valve has been repaired/replaced. Aortic valve   ?regurgitation is mild to moderate. There is a 23 mm Edwards bioprosthetic  ?valve present in the aortic position. Procedure Date: 03/01/14. Aortic valve  ?mean gradient measures 13.0 mmHg.  ? 8. The inferior vena cava is dilated in size with >50% respiratory  ?variability, suggesting right atrial pressure of 8 mmHg. ?__________ ? ?Lexiscan MPI 03/18/2018: ?Normal pharmacologic myocardial perfusion stress test without ischemia or scar. ?The left ventricular ejection fraction is normal by visual estimation and Siemens calculation (59%). LVEF by QGS is likely reduced due to gating artifact (42%). ?This is a low risk study. ? ?Patient Profile  ?   ?81 y.o. male with history of persistent atrial fibrillation, severe aortic stenosis s/p AVR (2016),  HF, SVT s/p ablation, DM2, and HTN admitted with acute HFrEF in the setting of atrial fibrillation. ? ?Assessment & Plan  ?  ?1. Acute HFrEF: ?-LVEF previously low normal to mildly reduced but found to be severely reduced this admission, limited echo 05/18/2021 showed persistent cardiomyopathy, which is to be expected as it will take a couple of months for this to improve if related to Afib ?-Suspicion is that uncontrolled a-fib may have worsened cardiomyopathy ?-Appears euvolemic ?-Continue to hold torsemide with continued decline in renal function ?-Recommend leg elevation and compression stockings ?-Continue lower dose Coreg  ?-No ACEi/ARB/ARNI/MRA/SGLT2i in the setting of acute on CKD ?-Escalate GDMT as able ?-Follow up limited echo in several months time to reassess LVSF in sinus rhythm, if cardiopathy persists at that time, will need to discuss ischemic evalaution  ?-Not currently a cath candidate with declining renal function  ?-Compression socks ? ?2. Persistent Afib: ?-Maintaining sinus rhythm ?-Continue lower dose Coreg and amiodarone to complete 10 gram load of amiodarone  ?-Eliquis 2.5 mg bid (age, SCr) ? ?3. Nonobstructive CAD with elevated troponin: ?-Noted on LHC  in 2015 prior to AVR ?-Minimal elevation in HS-Tn, not consistent with ACS ?-Eliquis in place of ASA to minimize bleeding risk ?-No plans for inpatient ischemic evaluation at this time with AKI  ? ?4. Acute on

## 2021-05-21 DIAGNOSIS — N1832 Chronic kidney disease, stage 3b: Secondary | ICD-10-CM | POA: Diagnosis not present

## 2021-05-21 DIAGNOSIS — D649 Anemia, unspecified: Secondary | ICD-10-CM | POA: Diagnosis not present

## 2021-05-21 DIAGNOSIS — I5041 Acute combined systolic (congestive) and diastolic (congestive) heart failure: Secondary | ICD-10-CM | POA: Diagnosis not present

## 2021-05-21 DIAGNOSIS — I480 Paroxysmal atrial fibrillation: Secondary | ICD-10-CM | POA: Diagnosis not present

## 2021-05-21 DIAGNOSIS — I5033 Acute on chronic diastolic (congestive) heart failure: Secondary | ICD-10-CM | POA: Diagnosis not present

## 2021-05-21 DIAGNOSIS — N179 Acute kidney failure, unspecified: Secondary | ICD-10-CM | POA: Diagnosis not present

## 2021-05-21 DIAGNOSIS — N170 Acute kidney failure with tubular necrosis: Secondary | ICD-10-CM | POA: Diagnosis not present

## 2021-05-21 DIAGNOSIS — I4819 Other persistent atrial fibrillation: Secondary | ICD-10-CM | POA: Diagnosis not present

## 2021-05-21 LAB — BASIC METABOLIC PANEL
Anion gap: 12 (ref 5–15)
BUN: 81 mg/dL — ABNORMAL HIGH (ref 8–23)
CO2: 28 mmol/L (ref 22–32)
Calcium: 8.4 mg/dL — ABNORMAL LOW (ref 8.9–10.3)
Chloride: 99 mmol/L (ref 98–111)
Creatinine, Ser: 4.32 mg/dL — ABNORMAL HIGH (ref 0.61–1.24)
GFR, Estimated: 13 mL/min — ABNORMAL LOW (ref >60)
Glucose, Bld: 225 mg/dL — ABNORMAL HIGH (ref 70–99)
Potassium: 4.6 mmol/L (ref 3.5–5.1)
Sodium: 139 mmol/L (ref 135–145)

## 2021-05-21 LAB — CBC
HCT: 35.1 % — ABNORMAL LOW (ref 39.0–52.0)
Hemoglobin: 10.7 g/dL — ABNORMAL LOW (ref 13.0–17.0)
MCH: 27.7 pg (ref 26.0–34.0)
MCHC: 30.5 g/dL (ref 30.0–36.0)
MCV: 90.9 fL (ref 80.0–100.0)
Platelets: 188 K/uL (ref 150–400)
RBC: 3.86 MIL/uL — ABNORMAL LOW (ref 4.22–5.81)
RDW: 17.2 % — ABNORMAL HIGH (ref 11.5–15.5)
WBC: 5.4 K/uL (ref 4.0–10.5)
nRBC: 0 % (ref 0.0–0.2)

## 2021-05-21 LAB — GLUCOSE, CAPILLARY
Glucose-Capillary: 158 mg/dL — ABNORMAL HIGH (ref 70–99)
Glucose-Capillary: 230 mg/dL — ABNORMAL HIGH (ref 70–99)
Glucose-Capillary: 165 mg/dL — ABNORMAL HIGH (ref 70–99)
Glucose-Capillary: 186 mg/dL — ABNORMAL HIGH (ref 70–99)

## 2021-05-21 MED ORDER — HYDROCORTISONE 1 % EX CREA
TOPICAL_CREAM | Freq: Two times a day (BID) | CUTANEOUS | Status: DC
  Administered 2021-05-22: 1 via TOPICAL
  Filled 2021-05-21 (×2): qty 28

## 2021-05-21 NOTE — Progress Notes (Signed)
Mobility Specialist - Progress Note ? ? ? 05/21/21 1100  ?Mobility  ?Activity Ambulated independently in hallway  ?Level of Assistance Independent  ?Assistive Device Front wheel walker  ?Distance Ambulated (ft) 320 ft  ?Activity Response Tolerated well  ?$Mobility charge 1 Mobility  ? ? ? ?Pt standing upon arrival using RA with NS present. Pt motivated to ambulate and ambulates 2 laps indep --- voiced complaint of mild right lower back pain but tolerates session well. Pt is left in room with needs in reach. ? ?Timothy Riley ?Mobility Specialist ?05/21/21, 11:49 AM ? ? ?

## 2021-05-21 NOTE — Progress Notes (Signed)
Bradycardia -low 50`s Coreg held. MD made aware. ?

## 2021-05-21 NOTE — Progress Notes (Signed)
? ?PROGRESS NOTE ? ? ? ?Timothy Riley  HYQ:[id number] DOB: 02/17/1940 DOA: 05/13/2021 ?PCP: Derinda Late, MD  ? ?Assessment & Plan: ?  ?Principal Problem: ?  Acute on chronic heart failure with preserved ejection fraction (HFpEF) ([place]) ?Active Problems: ?  Acute renal failure superimposed on stage 3b chronic kidney disease ([place]) ?  Atrial fibrillation ([place]) ?  Chronic anticoagulation ?  CAD (coronary artery disease) ?  H/O paroxysmal supraventricular tachycardia s/p ablation ?  Controlled type 2 diabetes mellitus without complication, without long-term current use of insulin ([place]) ?  Chronic anemia ?  Hypoglycemia ? ? 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 Continue to hold torsemide secondary to AKI on CKD. Monitor I/Os. Cardio following and recs apprec ?  ?AKI on CKD stage IIIb: possibly secondary to cardiorenal. Cr is trending down slightly today. Renal US shows unchanged right lower pole cyst, no hydronephrosis. Nephro following and recs apprec  ?  ?Hypokalemia: resolved ? ?Hyperkalemia: resolved  ?  ?Hx of paroxysmal SVT: continue on coreg, amio  ? ?Persistent a. fib: continue on amio, coreg & eliquis. S/p cardioversion which was successful   ?  ?DM2: HbA1c 6.4, well controlled. Continue on SSI w/ accuchecks ? ?Memory deficit: especially short term memory. No formal dx of dementia  ? ? ?DVT prophylaxis: eliquis  ?Code Status:  full  ?Family Communication: discussed pt's care w/ pt's son, [name], and answered his questions ?Disposition Plan: likely d/c back home. Pt refused PT ? ?Level of care: Progressive ? ?Status is: Inpatient ?Remains inpatient appropriate because: Cr is trending down slightly today  ? ? ? ?Consultants:  ?Cardio  ?Nephro  ? ?Procedures:  ? ?Antimicrobials:  ? ? ?Subjective: ?Pt c/o malaise  ? ?Objective: ?Vitals:  ? 05/20/21 2320 05/21/21 0326 05/21/21 0452 05/21/21 0746  ?BP: 134/68 125/67  (!) 110/91  ?Pulse: (!) 57 (!) 53  (!)  51  ?Resp: '16 18  16  '$ ?Temp: 97.6 ?F (36.4 ?C) 97.7 ?F (36.5 ?C)  97.7 ?F (36.5 ?C)  ?TempSrc: Oral Oral    ?SpO2: 95% 93%  96%  ?Weight:   92.9 kg   ?Height:      ? ? ?Intake/Output Summary (Last 24 hours) at 05/21/2021 0747 ?Last data filed at 05/20/2021 2130 ?Gross per 24 hour  ?Intake 237 ml  ?Output 600 ml  ?Net -363 ml  ? ?Filed Weights  ? 05/19/21 0319 05/20/21 0323 05/21/21 0452  ?Weight: 91.6 kg 93 kg 92.9 kg  ? ? ?Examination: ? ?General exam: Appears comfortable  ?Respiratory system: decreased breath sounds b/l  ?Cardiovascular system: S1/S2+. No rubs or gallops ?Gastrointestinal system: Abd is soft, NT, obese & hypoactive bowel sounds ?Central nervous system: Alert and awake. Moves all extremities  ?Psychiatry: judgement and insight appears poor. Appears agitated  ? ? ? ?Data Reviewed: I have personally reviewed following labs and imaging studies ? ?CBC: ?Recent Labs  ?Lab 05/18/21 ?9528 05/19/21 ?0332 05/20/21 ?4132 05/21/21 ?0356  ?WBC 6.0 6.4 6.4 5.4  ?HGB 10.6* 10.8* 10.4* 10.7*  ?HCT 34.7* 35.7* 34.8* 35.1*  ?MCV 91.1 91.3 91.3 90.9  ?PLT 172 186 195 188  ? ?Basic Metabolic Panel: ?Recent Labs  ?Lab 05/15/21 ?0605 05/16/21 ?4401 05/17/21 ?0272 05/18/21 ?5366 05/19/21 ?4403 05/19/21 ?1452 05/20/21 ?4742 05/21/21 ?0356  ?NA 144   < > 140 140 140  --  139 139  ?K 3.6   < > 4.5 4.8 5.4* 5.4* 5.0 4.6  ?  CL 102   < > 100 104 100  --  100 99  ?CO2 32   < > '29 28 30  '$ --  27 28  ?GLUCOSE 100*   < > 152* 144* 143*  --  89 225*  ?BUN 51*   < > 60* 63* 70*  --  75* 81*  ?CREATININE 2.46*   < > 3.23* 3.44* 4.05*  --  4.55* 4.32*  ?CALCIUM 8.5*   < > 8.6* 8.4* 8.5*  --  8.5* 8.4*  ?MG 2.3  --   --   --   --   --   --   --   ? < > = values in this interval not displayed.  ? ?GFR: ?Estimated Creatinine Clearance: 14.6 mL/min (A) (by C-G formula based on SCr of 4.32 mg/dL (H)). ?Liver Function Tests: ?Recent Labs  ?Lab 05/19/21 ?0332  ?ALBUMIN 3.2*  ? ?No results for input(s): LIPASE, AMYLASE in the last 168 hours. ?No  results for input(s): AMMONIA in the last 168 hours. ?Coagulation Profile: ?Recent Labs  ?Lab 05/16/21 ?0023  ?INR 2.1*  ? ?Cardiac Enzymes: ?No results for input(s): CKTOTAL, CKMB, CKMBINDEX, TROPONINI in the last 168 hours. ?BNP (last 3 results) ?No results for input(s): PROBNP in the last 8760 hours. ?HbA1C: ?No results for input(s): HGBA1C in the last 72 hours. ?CBG: ?Recent Labs  ?Lab 05/20/21 ?0801 05/20/21 ?1136 05/20/21 ?1537 05/20/21 ?1922 05/21/21 ?0746  ?GLUCAP 93 115* 103* 136* 158*  ? ?Lipid Profile: ?No results for input(s): CHOL, HDL, LDLCALC, TRIG, CHOLHDL, LDLDIRECT in the last 72 hours. ?Thyroid Function Tests: ?No results for input(s): TSH, T4TOTAL, FREET4, T3FREE, THYROIDAB in the last 72 hours. ? ?Anemia Panel: ?No results for input(s): VITAMINB12, FOLATE, FERRITIN, TIBC, IRON, RETICCTPCT in the last 72 hours. ?Sepsis Labs: ?No results for input(s): PROCALCITON, LATICACIDVEN in the last 168 hours. ? ?No results found for this or any previous visit (from the past 240 hour(s)).  ? ? ? ? ? ?Radiology Studies: ?US RENAL ? ?Result Date: 05/19/2021 ?CLINICAL DATA:  Chronic kidney disease. Worsening creatinine. Decreased urine output. EXAM: RENAL / URINARY TRACT ULTRASOUND COMPLETE COMPARISON:  CT abdomen and pelvis 03/07/2021; ultrasound renal 01/26/2019 FINDINGS: Right Kidney: Renal measurements: 11.0 x 5.1 x 6.4 cm = volume: 187 mL. Echogenicity within normal limits. There is an avascular hypoechoic cyst measuring up to 1.6 cm, measured up to 1.9 cm on 01/26/2019 ultrasound and demonstrating fluid density on 03/07/2021 CT. No hydronephrosis. Left Kidney: Renal measurements: 10.9 x 4.9 x 4.1 cm = volume: 115 mL mL. Echogenicity within normal limits. No mass or hydronephrosis visualized. Bladder: Only minimally distended, limiting evaluation. Other: Incidental note of ascites.  Ascites was also seen on prior CT. IMPRESSION:: IMPRESSION: 1. Unchanged right lower pole cyst measuring up to 1.6 cm. 2. No  hydronephrosis. Electronically Signed   By: Yvonne Kendall M.D.   On: 05/19/2021 11:26  ? ?DG Abd Portable 1V ? ?Result Date: 05/20/2021 ?CLINICAL DATA:  Abdominal bloating. EXAM: PORTABLE ABDOMEN - 1 VIEW COMPARISON:  CT of the abdomen pelvis February 7 22 at 3 FINDINGS: The bowel gas pattern is normal. No radio-opaque calculi or other significant radiographic abnormality are seen. IMPRESSION: Negative. Electronically Signed   By: Fidela Salisbury M.D.   On: 05/20/2021 12:12   ? ? ? ? ? ?Scheduled Meds: ? amiodarone  400 mg Oral BID  ? apixaban  2.5 mg Oral BID  ? carvedilol  6.25 mg Oral BID WC  ?  docusate sodium  200 mg Oral BID  ? feeding supplement (NEPRO CARB STEADY)  237 mL Oral TID  ? insulin aspart  0-9 Units Subcutaneous TID WC  ? polyethylene glycol  17 g Oral Daily  ? rosuvastatin  10 mg Oral Daily  ? ?Continuous Infusions: ? sodium chloride Stopped (05/17/21 1610)  ? sodium chloride    ? ? ? LOS: 8 days  ? ? ?Time spent: 20 mins  ? ? ? ?Wyvonnia Dusky, MD ?[facility] ?Pager [phone] ? ?If 7PM-7AM, please contact night-coverage ?05/21/2021, 7:47 AM  ? ?

## 2021-05-21 NOTE — Progress Notes (Signed)
? ?Progress Note ? ?Patient Name: Timothy Riley [facility] ?Date of Encounter: 05/21/2021 ? ?[facility] Cardiologist: Ida Rogue, MD  ? ?Subjective  ? ?Feeling well overall, though still somewhat bloated. Many questions about kidney function, discussed today. No chest pain. Breathing is stable. Making urine. ? ?Inpatient Medications  ?  ?Scheduled Meds: ? amiodarone  400 mg Oral BID  ? apixaban  2.5 mg Oral BID  ? carvedilol  6.25 mg Oral BID WC  ? docusate sodium  200 mg Oral BID  ? feeding supplement (NEPRO CARB STEADY)  237 mL Oral TID  ? insulin aspart  0-9 Units Subcutaneous TID WC  ? polyethylene glycol  17 g Oral Daily  ? rosuvastatin  10 mg Oral Daily  ? ?Continuous Infusions: ? sodium chloride Stopped (05/17/21 1610)  ? sodium chloride    ? ?PRN Meds: ?acetaminophen **OR** acetaminophen, loperamide, melatonin, morphine injection, ondansetron **OR** ondansetron (ZOFRAN) IV, oxyCODONE-acetaminophen  ? ?Vital Signs  ?  ?Vitals:  ? 05/21/21 0326 05/21/21 0452 05/21/21 0746 05/21/21 1148  ?BP: 125/67  (!) 110/91 (!) 110/57  ?Pulse: (!) 53  (!) 51 (!) 51  ?Resp: '18  16 16  '$ ?Temp: 97.7 ?F (36.5 ?C)  97.7 ?F (36.5 ?C) 97.8 ?F (36.6 ?C)  ?TempSrc: Oral     ?SpO2: 93%  96% 97%  ?Weight:  92.9 kg    ?Height:      ? ? ?Intake/Output Summary (Last 24 hours) at 05/21/2021 1210 ?Last data filed at 05/21/2021 1000 ?Gross per 24 hour  ?Intake 597 ml  ?Output 500 ml  ?Net 97 ml  ? ? ?  05/21/2021  ?  4:52 AM 05/20/2021  ?  3:23 AM 05/19/2021  ?  3:19 AM  ?Last 3 Weights  ?Weight (lbs) 204 lb 11.2 oz 205 lb 201 lb 15.8 oz  ?Weight (kg) 92.851 kg 92.987 kg 91.62 kg  ?   ? ?Telemetry  ?  ?SR - Personally Reviewed ? ?ECG  ?  ?No new - Personally Reviewed ? ?Physical Exam  ? ?GEN: No acute distress.   ?Neck: No JVD ?Cardiac: RRR, no murmurs, rubs, or gallops.  ?Respiratory: Clear to auscultation bilaterally. ?GI: Soft, nontender, mildly distended ?MS: No edema; No deformity. ?Neuro:  Nonfocal  ?Psych: Normal affect  ? ?Labs  ?  ?High  Sensitivity Troponin:   ?Recent Labs  ?Lab 05/13/21 ?2037 05/14/21 ?7893  ?TROPONINIHS 20* 19*  ?   ?Chemistry ?Recent Labs  ?Lab 05/15/21 ?0605 05/16/21 ?8101 05/19/21 ?7510 05/19/21 ?1452 05/20/21 ?2585 05/21/21 ?0356  ?NA 144   < > 140  --  139 139  ?K 3.6   < > 5.4* 5.4* 5.0 4.6  ?CL 102   < > 100  --  100 99  ?CO2 32   < > 30  --  27 28  ?GLUCOSE 100*   < > 143*  --  89 225*  ?BUN 51*   < > 70*  --  75* 81*  ?CREATININE 2.46*   < > 4.05*  --  4.55* 4.32*  ?CALCIUM 8.5*   < > 8.5*  --  8.5* 8.4*  ?MG 2.3  --   --   --   --   --   ?ALBUMIN  --   --  3.2*  --   --   --   ?GFRNONAA 26*   < > 14*  --  12* 13*  ?ANIONGAP 10   < > 10  --  12 12  ? < > =  values in this interval not displayed.  ?  ?Lipids No results for input(s): CHOL, TRIG, HDL, LABVLDL, LDLCALC, CHOLHDL in the last 168 hours.  ?Hematology ?Recent Labs  ?Lab 05/19/21 ?0332 05/20/21 ?8338 05/21/21 ?0356  ?WBC 6.4 6.4 5.4  ?RBC 3.91* 3.81* 3.86*  ?HGB 10.8* 10.4* 10.7*  ?HCT 35.7* 34.8* 35.1*  ?MCV 91.3 91.3 90.9  ?MCH 27.6 27.3 27.7  ?MCHC 30.3 29.9* 30.5  ?RDW 16.4* 17.1* 17.2*  ?PLT 186 195 188  ? ?Thyroid  ?Recent Labs  ?Lab 05/15/21 ?2505  ?TSH 5.107*  ?  ?BNPNo results for input(s): BNP, PROBNP in the last 168 hours.  ?DDimer No results for input(s): DDIMER in the last 168 hours.  ? ?Radiology  ?  ?DG Abd Portable 1V ? ?Result Date: 05/20/2021 ?CLINICAL DATA:  Abdominal bloating. EXAM: PORTABLE ABDOMEN - 1 VIEW COMPARISON:  CT of the abdomen pelvis February 7 22 at 3 FINDINGS: The bowel gas pattern is normal. No radio-opaque calculi or other significant radiographic abnormality are seen. IMPRESSION: Negative. Electronically Signed   By: Fidela Salisbury M.D.   On: 05/20/2021 12:12   ? ?Cardiac Studies  ? ?Limited echo 05/18/2021: ?1. Left ventricular ejection fraction, by estimation, is 30 %. The left  ?ventricle has moderately decreased function. The left ventricle  ?demonstrates global hypokinesis.  ? 2. Right ventricular systolic function is  moderately reduced. The right  ?ventricular size is mildly enlarged. There is mildly elevated pulmonary  ?artery systolic pressure. The estimated right ventricular systolic  ?pressure is 39.7 mmHg.  ? 3. Left atrial size was mildly dilated.  ? 4. The mitral valve is normal in structure. No evidence of mitral valve  ?regurgitation.  ? 5. Tricuspid valve regurgitation is mild to moderate.  ? 6. The aortic valve was not well visualized. Aortic valve regurgitation  ?is not visualized.  ? 7. The inferior vena cava is normal in size with greater than 50%  ?respiratory variability, suggesting right atrial pressure of 3 mmHg. ?__________ ?  ?2D echo 05/14/2021: ?1. Left ventricular ejection fraction, by estimation, is 30 to 35%. The  ?left ventricle has moderately decreased function. The left ventricle  ?demonstrates global hypokinesis. The left ventricular internal cavity size  ?was moderately dilated. Left  ?ventricular diastolic parameters are indeterminate.  ? 2. Right ventricular systolic function is moderately reduced. The right  ?ventricular size is moderately enlarged. There is moderately elevated  ?pulmonary artery systolic pressure. The estimated right ventricular  ?systolic pressure is 67.3 mmHg.  ? 3. Left atrial size was moderately dilated.  ? 4. The mitral valve is normal in structure. Moderate mitral valve  ?regurgitation. No evidence of mitral stenosis.  ? 5. Tricuspid valve regurgitation is moderate.  ? 6. The aortic valve has been repaired/replaced. 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 Aortic valve  ?regurgitation is not visualized. No aortic stenosis is present.  ? 7. The inferior vena cava is dilated in size with <50% respiratory  ?variability, suggesting right atrial pressure of 15 mmHg.  ?__________ ?  ?2D echo 09/13/2020: ?1. Left ventricular ejection fraction, by estimation, is 50 to 55%. The  ?left ventricle has low normal function. Left ventricular endocardial   ?border not optimally defined to evaluate regional wall motion. There is  ?mild left ventricular hypertrophy. Left  ?ventricular diastolic parameters are consistent with Grade II diastolic  ?dysfunction (pseudonormalization). Elevated left atrial pressure.  ? 2. Right ventricular systolic function is normal. The right ventricular  ?  size is normal. There is moderately elevated pulmonary artery systolic  ?pressure.  ? 3. Left atrial size was moderately dilated.  ? 4. Right atrial size was mildly dilated.  ? 5. The mitral valve is abnormal. Mild mitral valve regurgitation. No  ?evidence of mitral stenosis.  ? 6. Tricuspid valve regurgitation is mild to moderate.  ? 7. The aortic valve has been repaired/replaced. Aortic valve  ?regurgitation is mild to moderate. There is a 23 mm Edwards bioprosthetic  ?valve present in the aortic position. Procedure Date: 03/01/14. Aortic valve  ?mean gradient measures 13.0 mmHg.  ? 8. The inferior vena cava is dilated in size with >50% respiratory  ?variability, suggesting right atrial pressure of 8 mmHg. ?__________ ?  ?Lexiscan MPI 03/18/2018: ?Normal pharmacologic myocardial perfusion stress test without ischemia or scar. ?The left ventricular ejection fraction is normal by visual estimation and Siemens calculation (59%). LVEF by QGS is likely reduced due to gating artifact (42%). ?This is a low risk study. ? ?Patient Profile  ?   ?81 y.o. male with history of persistent atrial fibrillation, severe aortic stenosis s/p AVR (2016), HF, SVT s/p ablation, DM2, and HTN admitted with acute HFrEF in the setting of atrial fibrillation. ? ?Assessment & Plan  ?  ?Acute systolic and diastolic heart failure ?Acute kidney injury on chronic kidney disease stage 3b ?Persistent atrial fibrillation, now in SR s/p cardioversion ?-EF worse than prior this admission.  ?-Was diuresed, but now has acute kidney injury. Cr today starting to downtrend. Charted 600 ml urine yesterday, better than 220 day  prior. ?-limited options for GDMT at this time, on carvedilol ?-on renal/age adjusted dose of apixaban ?-continue amiodarone ?  ?History of AS s/p AVR 2016 ?-valve is stable ? ?Nonobstructive CAD with elevated

## 2021-05-21 NOTE — Progress Notes (Signed)
Mobility Specialist - Progress Note ? ? ? 05/21/21 1628  ?Mobility  ?Activity Ambulated independently in hallway  ?Level of Assistance Independent  ?Assistive Device Front wheel walker  ?Distance Ambulated (ft) 320 ft  ?Activity Response Tolerated well  ?$Mobility charge 1 Mobility  ? ? ? ?Pt ambulates 32f voicing no complaints and returns to chair with needs in reach and family at bedside. ? ?MMerrily Brittle?Mobility Specialist ?05/21/21, 4:31 PM ? ? ? ? ?

## 2021-05-21 NOTE — Progress Notes (Addendum)
?[facility]  ?ROUNDING NOTE  ? ?Subjective:  ? ?Timothy Riley is a 81 year old male with past medical history including CAD, hypertension, A-fib with prior cardioversion on Eliquis, and CKD 3B.  Patient presents to the emergency room with complaints of shortness of breath and increased lower extremity edema.  Patient has been admitted for CHF exacerbation ([place]) [I50.9] ?Hypervolemia, unspecified hypervolemia type [E87.70] ? ?Patient is known to our practice and was followed by Dr. Candiss Norse.  Patient currently states he does not currently follow a nephrologist due to continued decline of renal function.  Patient states that diuresis is managed by cardiologist, Dr. Rockey Situ.  ? ?Update ?Patient seen sitting in chair ?Alert and oriented ?Various questions asked about treatment plan and dialysis. ?Patient seen later ambulating in hallway ? ?Creatinine improved 4.32 ?Urine output 600 mL preceding 24 hours ? ?Objective:  ?Vital signs in last 24 hours:  ?Temp:  [97.6 ?F (36.4 ?C)-97.9 ?F (36.6 ?C)] 97.8 ?F (36.6 ?C) (04/23 1618) ?Pulse Rate:  [51-57] 51 (04/23 1618) ?Resp:  [16-18] 16 (04/23 1618) ?BP: (110-134)/(56-91) 114/63 (04/23 1618) ?SpO2:  [91 %-97 %] 96 % (04/23 1618) ?Weight:  [92.9 kg] 92.9 kg (04/23 0452) ? ?Weight change: -0.136 kg ?Filed Weights  ? 05/19/21 0319 05/20/21 0323 05/21/21 0452  ?Weight: 91.6 kg 93 kg 92.9 kg  ? ? ?Intake/Output: ?I/O last 3 completed shifts: ?In: 597 [P.O.:360; NG/GT:237] ?Out: 800 [Urine:800] ?  ?Intake/Output this shift: ? No intake/output data recorded. ? ?Physical Exam: ?General: NAD  ?Head: Normocephalic, atraumatic. Moist oral mucosal membranes  ?Eyes: Anicteric  ?Lungs:  Mild basilar crackles, normal effort  ?Heart: Regular rate and rhythm  ?Abdomen:  Soft, nontender, distended  ?Extremities: 2+ peripheral edema.  ?Neurologic: Nonfocal, moving all four extremities  ?Skin: No lesions  ?Access: None  ? ? ?Basic Metabolic Panel: ?Recent Labs  ?Lab 05/15/21 ?0605  05/16/21 ?6761 05/17/21 ?9509 05/18/21 ?3267 05/19/21 ?1245 05/19/21 ?1452 05/20/21 ?8099 05/21/21 ?0356  ?NA 144   < > 140 140 140  --  139 139  ?K 3.6   < > 4.5 4.8 5.4* 5.4* 5.0 4.6  ?CL 102   < > 100 104 100  --  100 99  ?CO2 32   < > '29 28 30  '$ --  27 28  ?GLUCOSE 100*   < > 152* 144* 143*  --  89 225*  ?BUN 51*   < > 60* 63* 70*  --  75* 81*  ?CREATININE 2.46*   < > 3.23* 3.44* 4.05*  --  4.55* 4.32*  ?CALCIUM 8.5*   < > 8.6* 8.4* 8.5*  --  8.5* 8.4*  ?MG 2.3  --   --   --   --   --   --   --   ? < > = values in this interval not displayed.  ? ? ? ?Liver Function Tests: ?Recent Labs  ?Lab 05/19/21 ?0332  ?ALBUMIN 3.2*  ? ? ?No results for input(s): LIPASE, AMYLASE in the last 168 hours. ?No results for input(s): AMMONIA in the last 168 hours. ? ?CBC: ?Recent Labs  ?Lab 05/18/21 ?8338 05/19/21 ?0332 05/20/21 ?2505 05/21/21 ?0356  ?WBC 6.0 6.4 6.4 5.4  ?HGB 10.6* 10.8* 10.4* 10.7*  ?HCT 34.7* 35.7* 34.8* 35.1*  ?MCV 91.1 91.3 91.3 90.9  ?PLT 172 186 195 188  ? ? ? ?Cardiac Enzymes: ?No results for input(s): CKTOTAL, CKMB, CKMBINDEX, TROPONINI in the last 168 hours. ? ?BNP: ?Invalid input(s): POCBNP ? ?CBG: ?Recent  Labs  ?Lab 05/20/21 ?1537 05/20/21 ?1922 05/21/21 ?0746 05/21/21 ?1146 05/21/21 ?1617  ?GLUCAP 103* 136* 158* 230* 165*  ? ? ? ?Microbiology: ?Results for orders placed or performed during the hospital encounter of 03/07/21  ?Resp Panel by RT-PCR (Flu A&B, Covid) Nasopharyngeal Swab     Status: None  ? Collection Time: 03/07/21 11:21 PM  ? Specimen: Nasopharyngeal Swab; Nasopharyngeal(NP) swabs in vial transport medium  ?Result Value Ref Range Status  ? SARS Coronavirus 2 by RT PCR NEGATIVE NEGATIVE Final  ?  Comment: (NOTE) ?SARS-CoV-2 target nucleic acids are NOT DETECTED. ? ?The SARS-CoV-2 RNA is generally detectable in upper respiratory ?specimens during the acute phase of infection. The lowest ?concentration of SARS-CoV-2 viral copies this assay can detect is ?138 copies/mL. A negative result does  not preclude SARS-Cov-2 ?infection and should not be used as the sole basis for treatment or ?other patient management decisions. A negative result may occur with  ?improper specimen collection/handling, submission of specimen other ?than nasopharyngeal swab, presence of viral mutation(s) within the ?areas targeted by this assay, and inadequate number of viral ?copies(<138 copies/mL). A negative result must be combined with ?clinical observations, patient history, and epidemiological ?information. The expected result is Negative. ? ?Fact Sheet for Patients:  ?EntrepreneurPulse.com.au ? ?Fact Sheet for Healthcare Providers:  ?IncredibleEmployment.be ? ?This test is no t yet approved or cleared by the [place] FDA and  ?has been authorized for detection and/or diagnosis of SARS-CoV-2 by ?FDA under an Emergency Use Authorization (EUA). This EUA will remain  ?in effect (meaning this test can be used) for the duration of the ?COVID-19 declaration under Section 564(b)(1) of the Act, 21 ?U.S.C.section 360bbb-3(b)(1), unless the authorization is terminated  ?or revoked sooner.  ? ? ?  ? Influenza A by PCR NEGATIVE NEGATIVE Final  ? Influenza B by PCR NEGATIVE NEGATIVE Final  ?  Comment: (NOTE) ?The Xpert Xpress SARS-CoV-2/FLU/RSV plus assay is intended as an aid ?in the diagnosis of influenza from Nasopharyngeal swab specimens and ?should not be used as a sole basis for treatment. Nasal washings and ?aspirates are unacceptable for Xpert Xpress SARS-CoV-2/FLU/RSV ?testing. ? ?Fact Sheet for Patients: ?EntrepreneurPulse.com.au ? ?Fact Sheet for Healthcare Providers: ?IncredibleEmployment.be ? ?This test is not yet approved or cleared by the [place] FDA and ?has been authorized for detection and/or diagnosis of SARS-CoV-2 by ?FDA under an Emergency Use Authorization (EUA). This EUA will remain ?in effect (meaning this test can be used) for the  duration of the ?COVID-19 declaration under Section 564(b)(1) of the Act, 21 U.S.C. ?section 360bbb-3(b)(1), unless the authorization is terminated or ?revoked. ? ?Performed at [facility], [place], ?[place] [address] ?  ?Culture, blood (routine x 2)     Status: None  ? Collection Time: 03/08/21  1:07 AM  ? Specimen: BLOOD  ?Result Value Ref Range Status  ? Specimen Description BLOOD RIGHT HAND  Final  ? Special Requests   Final  ?  BOTTLES DRAWN AEROBIC AND ANAEROBIC Blood Culture results may not be optimal due to an inadequate volume of blood received in culture bottles  ? Culture   Final  ?  NO GROWTH 5 DAYS ?Performed at [facility], [address] [place], [place] [address] ?  ? Report Status 03/13/2021 FINAL  Final  ?Urine Culture     Status: Abnormal  ? Collection Time: 03/08/21  1:17 AM  ? Specimen: Urine, Clean Catch  ?Result Value Ref Range Status  ? Specimen  Description   Final  ?  URINE, CLEAN CATCH ?Performed at [facility], [address] [place], [place] [address] ?  ? Special Requests   Final  ?  NONE ?Performed at [facility], [address], [place], [place] [address] ?  ? Culture (A)  Final  ?  20,000 COLONIES/mL STREPTOCOCCUS AGALACTIAE ?TESTING AGAINST S. AGALACTIAE NOT ROUTINELY PERFORMED DUE TO PREDICTABILITY OF AMP/PEN/VAN SUSCEPTIBILITY. ?Performed at [facility], [place] [address] [place], [place] [address] ?  ? Report Status 03/09/2021 FINAL  Final  ?Culture, blood (routine x 2)     Status: None  ? Collection Time: 03/08/21  2:02 AM  ? Specimen: BLOOD  ?Result Value Ref Range Status  ? Specimen Description BLOOD RIGHT HAND  Final  ? Special Requests   Final  ?  BOTTLES DRAWN AEROBIC AND ANAEROBIC Blood Culture adequate volume  ? Culture   Final  ?  NO GROWTH 5 DAYS ?Performed at [facility], [address], [place],  [address] ?  ? Report Status 03/13/2021 FINAL  Final  ? ? ?Coagulation Studies: ?No  results for input(s): LABPROT, INR in the last 72 hours. ? ?Urinalysis: ?No results for input(s): COLORURINE, LABSPEC, PHURINE, GLUCOSEU, HGBUR, BILIRUBINUR, KETONESUR, PROTEINUR, UROBILINOGEN, NITRITE, LEUKOCYT

## 2021-05-21 NOTE — Progress Notes (Signed)
Patient complaining of discomfort related to hemorrhoids after a bowel movement. Md made aware and medication ordered. Patient encouraged to use ordered laxatives and stool softeners. Patient had refused miralax today. ?

## 2021-05-22 DIAGNOSIS — N1832 Chronic kidney disease, stage 3b: Secondary | ICD-10-CM | POA: Diagnosis not present

## 2021-05-22 DIAGNOSIS — I5033 Acute on chronic diastolic (congestive) heart failure: Secondary | ICD-10-CM | POA: Diagnosis not present

## 2021-05-22 DIAGNOSIS — I4819 Other persistent atrial fibrillation: Secondary | ICD-10-CM | POA: Diagnosis not present

## 2021-05-22 DIAGNOSIS — N170 Acute kidney failure with tubular necrosis: Secondary | ICD-10-CM | POA: Diagnosis not present

## 2021-05-22 LAB — GLUCOSE, CAPILLARY
Glucose-Capillary: 172 mg/dL — ABNORMAL HIGH (ref 70–99)
Glucose-Capillary: 149 mg/dL — ABNORMAL HIGH (ref 70–99)
Glucose-Capillary: 179 mg/dL — ABNORMAL HIGH (ref 70–99)
Glucose-Capillary: 230 mg/dL — ABNORMAL HIGH (ref 70–99)

## 2021-05-22 LAB — CBC
HCT: 35.3 % — ABNORMAL LOW (ref 39.0–52.0)
Hemoglobin: 10.9 g/dL — ABNORMAL LOW (ref 13.0–17.0)
MCH: 28.1 pg (ref 26.0–34.0)
MCHC: 30.9 g/dL (ref 30.0–36.0)
MCV: 91 fL (ref 80.0–100.0)
Platelets: 192 10*3/uL (ref 150–400)
RBC: 3.88 MIL/uL — ABNORMAL LOW (ref 4.22–5.81)
RDW: 17.7 % — ABNORMAL HIGH (ref 11.5–15.5)
WBC: 6.5 10*3/uL (ref 4.0–10.5)
nRBC: 0 % (ref 0.0–0.2)

## 2021-05-22 LAB — BASIC METABOLIC PANEL
Anion gap: 8 (ref 5–15)
BUN: 90 mg/dL — ABNORMAL HIGH (ref 8–23)
CO2: 30 mmol/L (ref 22–32)
Calcium: 8.4 mg/dL — ABNORMAL LOW (ref 8.9–10.3)
Chloride: 99 mmol/L (ref 98–111)
Creatinine, Ser: 4.37 mg/dL — ABNORMAL HIGH (ref 0.61–1.24)
GFR, Estimated: 13 mL/min — ABNORMAL LOW (ref >60)
Glucose, Bld: 168 mg/dL — ABNORMAL HIGH (ref 70–99)
Potassium: 4.9 mmol/L (ref 3.5–5.1)
Sodium: 137 mmol/L (ref 135–145)

## 2021-05-22 MED ORDER — FUROSEMIDE 10 MG/ML IJ SOLN: 80.0000 mg | Freq: Once | INTRAMUSCULAR | Status: DC

## 2021-05-22 MED ORDER — ALPRAZOLAM 0.25 MG PO TABS
0.2500 mg | ORAL_TABLET | Freq: Once | ORAL | Status: AC
  Administered 2021-05-22: 0.25 mg via ORAL
  Filled 2021-05-22: qty 1

## 2021-05-22 MED ORDER — ALPRAZOLAM 0.25 MG PO TABS
0.2500 mg | ORAL_TABLET | Freq: Every day | ORAL | Status: AC
  Administered 2021-05-22: 0.25 mg via ORAL
  Filled 2021-05-22: qty 1

## 2021-05-22 MED ORDER — CHLORHEXIDINE GLUCONATE CLOTH 2 % EX PADS
6.0000 | MEDICATED_PAD | Freq: Every day | CUTANEOUS | Status: DC
  Administered 2021-05-24 – 2021-05-29 (×5): 6 via TOPICAL

## 2021-05-22 MED ORDER — FUROSEMIDE 40 MG PO TABS
80.0000 mg | ORAL_TABLET | Freq: Once | ORAL | Status: AC
Start: 2021-05-22 — End: 2021-05-22
  Administered 2021-05-22: 80 mg via ORAL
  Filled 2021-05-22: qty 2

## 2021-05-22 NOTE — Progress Notes (Signed)
?[facility]  ?ROUNDING NOTE  ? ?Subjective:  ? ?Timothy Riley is a 81 year old male with past medical history including CAD, hypertension, A-fib with prior cardioversion on Eliquis, and CKD 3B.  Patient presents to the emergency room with complaints of shortness of breath and increased lower extremity edema.  Patient has been admitted for CHF exacerbation ([place]) [I50.9] ?Hypervolemia, unspecified hypervolemia type [E87.70] ? ?Patient is known to our practice and was followed by Dr. Candiss Norse.  Patient currently states he does not currently follow a nephrologist due to continued decline of renal function.  Patient states that diuresis is managed by cardiologist, Dr. Rockey Situ.  ? ?Update ?Patient seen sitting up in chair ?Voices concerns about 5lb weight gain and declining labs.  ?Request that if dialysis is needed, he is ready to start and would like to plan for home treatments if needed long term. ?Shortness of breath witnessed on exertion  ? ?Creatinine 4.37 ?Urine output reduced to 350 mL preceding 24 hours ? ?Objective:  ?Vital signs in last 24 hours:  ?Temp:  [97.4 ?F (36.3 ?C)-98 ?F (36.7 ?C)] 97.6 ?F (36.4 ?C) (04/24 1131) ?Pulse Rate:  [51-75] 55 (04/24 1131) ?Resp:  [16-20] 17 (04/24 1131) ?BP: (112-137)/(49-74) 126/70 (04/24 1131) ?SpO2:  [96 %-100 %] 100 % (04/24 1131) ?Weight:  [94.8 kg] 94.8 kg (04/24 0452) ? ?Weight change: 1.996 kg ?Filed Weights  ? 05/20/21 0323 05/21/21 0452 05/22/21 0452  ?Weight: 93 kg 92.9 kg 94.8 kg  ? ? ?Intake/Output: ?I/O last 3 completed shifts: ?In: 837 [P.O.:600; NG/GT:237] ?Out: 550 [Urine:550] ?  ?Intake/Output this shift: ? Total I/O ?In: 240 [P.O.:240] ?Out: 200 [Urine:200] ? ?Physical Exam: ?General: NAD  ?Head: Normocephalic, atraumatic. Moist oral mucosal membranes  ?Eyes: Anicteric  ?Lungs:  Basilar crackles, normal effort, DOE  ?Heart: Regular rate and rhythm  ?Abdomen:  Soft, nontender, distended  ?Extremities: 3+ peripheral edema. Abdominal wall edema   ?Neurologic: Nonfocal, moving all four extremities  ?Skin: No lesions  ?Access: None  ? ? ?Basic Metabolic Panel: ?Recent Labs  ?Lab 05/18/21 ?0300 05/19/21 ?0332 05/19/21 ?1452 05/20/21 ?0353 05/21/21 ?0356 05/22/21 ?0410  ?NA 140 140  --  139 139 137  ?K 4.8 5.4* 5.4* 5.0 4.6 4.9  ?CL 104 100  --  100 99 99  ?CO2 28 30  --  '27 28 30  '$ ?GLUCOSE 144* 143*  --  89 225* 168*  ?BUN 63* 70*  --  75* 81* 90*  ?CREATININE 3.44* 4.05*  --  4.55* 4.32* 4.37*  ?CALCIUM 8.4* 8.5*  --  8.5* 8.4* 8.4*  ? ? ? ?Liver Function Tests: ?Recent Labs  ?Lab 05/19/21 ?0332  ?ALBUMIN 3.2*  ? ? ?No results for input(s): LIPASE, AMYLASE in the last 168 hours. ?No results for input(s): AMMONIA in the last 168 hours. ? ?CBC: ?Recent Labs  ?Lab 05/18/21 ?0609 05/19/21 ?0332 05/20/21 ?9233 05/21/21 ?0356 05/22/21 ?0410  ?WBC 6.0 6.4 6.4 5.4 6.5  ?HGB 10.6* 10.8* 10.4* 10.7* 10.9*  ?HCT 34.7* 35.7* 34.8* 35.1* 35.3*  ?MCV 91.1 91.3 91.3 90.9 91.0  ?PLT 172 186 195 188 192  ? ? ? ?Cardiac Enzymes: ?No results for input(s): CKTOTAL, CKMB, CKMBINDEX, TROPONINI in the last 168 hours. ? ?BNP: ?Invalid input(s): POCBNP ? ?CBG: ?Recent Labs  ?Lab 05/21/21 ?1146 05/21/21 ?1617 05/21/21 ?2021 05/22/21 ?0809 05/22/21 ?1148  ?GLUCAP 230* 165* 186* 172* 149*  ? ? ? ?Microbiology: ?Results for orders placed or performed during the hospital encounter of 03/07/21  ?Resp Panel  by RT-PCR (Flu A&B, Covid) Nasopharyngeal Swab     Status: None  ? Collection Time: 03/07/21 11:21 PM  ? Specimen: Nasopharyngeal Swab; Nasopharyngeal(NP) swabs in vial transport medium  ?Result Value Ref Range Status  ? SARS Coronavirus 2 by RT PCR NEGATIVE NEGATIVE Final  ?  Comment: (NOTE) ?SARS-CoV-2 target nucleic acids are NOT DETECTED. ? ?The SARS-CoV-2 RNA is generally detectable in upper respiratory ?specimens during the acute phase of infection. The lowest ?concentration of SARS-CoV-2 viral copies this assay can detect is ?138 copies/mL. A negative result does not preclude  SARS-Cov-2 ?infection and should not be used as the sole basis for treatment or ?other patient management decisions. A negative result may occur with  ?improper specimen collection/handling, submission of specimen other ?than nasopharyngeal swab, presence of viral mutation(s) within the ?areas targeted by this assay, and inadequate number of viral ?copies(<138 copies/mL). A negative result must be combined with ?clinical observations, patient history, and epidemiological ?information. The expected result is Negative. ? ?Fact Sheet for Patients:  ?EntrepreneurPulse.com.au ? ?Fact Sheet for Healthcare Providers:  ?IncredibleEmployment.be ? ?This test is no t yet approved or cleared by the [place] FDA and  ?has been authorized for detection and/or diagnosis of SARS-CoV-2 by ?FDA under an Emergency Use Authorization (EUA). This EUA will remain  ?in effect (meaning this test can be used) for the duration of the ?COVID-19 declaration under Section 564(b)(1) of the Act, 21 ?U.S.C.section 360bbb-3(b)(1), unless the authorization is terminated  ?or revoked sooner.  ? ? ?  ? Influenza A by PCR NEGATIVE NEGATIVE Final  ? Influenza B by PCR NEGATIVE NEGATIVE Final  ?  Comment: (NOTE) ?The Xpert Xpress SARS-CoV-2/FLU/RSV plus assay is intended as an aid ?in the diagnosis of influenza from Nasopharyngeal swab specimens and ?should not be used as a sole basis for treatment. Nasal washings and ?aspirates are unacceptable for Xpert Xpress SARS-CoV-2/FLU/RSV ?testing. ? ?Fact Sheet for Patients: ?EntrepreneurPulse.com.au ? ?Fact Sheet for Healthcare Providers: ?IncredibleEmployment.be ? ?This test is not yet approved or cleared by the [place] FDA and ?has been authorized for detection and/or diagnosis of SARS-CoV-2 by ?FDA under an Emergency Use Authorization (EUA). This EUA will remain ?in effect (meaning this test can be used) for the duration of  the ?COVID-19 declaration under Section 564(b)(1) of the Act, 21 U.S.C. ?section 360bbb-3(b)(1), unless the authorization is terminated or ?revoked. ? ?Performed at [facility], [place], ?[place] [address] ?  ?Culture, blood (routine x 2)     Status: None  ? Collection Time: 03/08/21  1:07 AM  ? Specimen: BLOOD  ?Result Value Ref Range Status  ? Specimen Description BLOOD RIGHT HAND  Final  ? Special Requests   Final  ?  BOTTLES DRAWN AEROBIC AND ANAEROBIC Blood Culture results may not be optimal due to an inadequate volume of blood received in culture bottles  ? Culture   Final  ?  NO GROWTH 5 DAYS ?Performed at [facility], [address], [place], [place] [address] ?  ? Report Status 03/13/2021 FINAL  Final  ?Urine Culture     Status: Abnormal  ? Collection Time: 03/08/21  1:17 AM  ? Specimen: Urine, Clean Catch  ?Result Value Ref Range Status  ? Specimen Description   Final  ?  URINE, CLEAN CATCH ?Performed at [facility], [address] [place], [place] [address] ?  ? Special Requests   Final  ?  NONE ?Performed at [facility] [phone]  [address] [place], [place] [address] ?  ? Culture (A)  Final  ?  20,000 COLONIES/mL STREPTOCOCCUS AGALACTIAE ?TESTING AGAINST S. AGALACTIAE NOT ROUTINELY PERFORMED DUE TO PREDICTABILITY OF AMP/PEN/VAN SUSCEPTIBILITY. ?Performed at [facility], [place] [address], [place], [place] [address] ?  ? Report Status 03/09/2021 FINAL  Final  ?Culture, blood (routine x 2)     Status: None  ? Collection Time: 03/08/21  2:02 AM  ? Specimen: BLOOD  ?Result Value Ref Range Status  ? Specimen Description BLOOD RIGHT HAND  Final  ? Special Requests   Final  ?  BOTTLES DRAWN AEROBIC AND ANAEROBIC Blood Culture adequate volume  ? Culture   Final  ?  NO GROWTH 5 DAYS ?Performed at [facility], [address] [place], [place] [address] ?  ? Report Status 03/13/2021 FINAL  Final  ? ? ?Coagulation Studies: ?No results for  input(s): LABPROT, INR in the last 72 hours. ? ?Urinalysis: ?No results for input(s): COLORURINE, LABSPEC, PHURINE, GLUCOSEU, HGBUR, BILIRUBINUR, KETONESUR, PROTEINUR, UROBILINOGEN, NITRITE, LEUKOCYTESUR in the last

## 2021-05-22 NOTE — Progress Notes (Signed)
Physical Therapy Treatment ?Patient Details ?Name: Timothy Riley [facility] ?MRN: [id number] ?DOB: 03/17/1940 ?Today's Date: 05/22/2021 ? ? ?History of Present Illness Omar Gayden is an 52yoM admitted for acute on chronic heart failure and is currently s/p cardioversion. Medical history significant for Persistent A-fib with prior cardioversion, on Eliquis, s/p bioprosthetic AVR, G2 DD (EF 50 to 55% 08/2020), diabetes, nonobstructive CAD with low risk Myoview in 2020, HTN, CKD 3B,hospitalized from 2/8 to 03/15/2021 with acute gastroenteritis and CHF exacerbation  complicated by AKI related to diuretic therapy who presents to the ED  with a complaint of shortness of breath, abdominal distention and bilateral lower extremity edema that has not been responding to outpatient therapy. ? ?  ?PT Comments  ? ? Pt making progress in AMB tolerance over weekend with mobility tech, however his renal function is worsening and he has increased pedal edema this date which is creating setbacks. Pt also received anxiolytic therapy earlier in day per his niece and has been somnolent last couple hours Pt easily awakened, agreeable to move back to bed, but agreeable to AMB into bathroom to void, output left for NSG to document but appears to be less than 250cc. Pt assisted back to bed, able to return to supine without assist, but weight of legs making an obvious difference in required effort. Pt declined additional mobility at this time, is waiting to go OTF for HD access placement.  ?  ?Recommendations for follow up therapy are one component of a multi-disciplinary discharge planning process, led by the attending physician.  Recommendations may be updated based on patient status, additional functional criteria and insurance authorization. ? ?Follow Up Recommendations ? Home health PT ?  ?  ?Assistance Recommended at Discharge PRN  ?Patient can return home with the following A little help with walking and/or transfers;Help with stairs or ramp for  entrance;A little help with bathing/dressing/bathroom ?  ?Equipment Recommendations ? Rolling walker (2 wheels)  ?  ?Recommendations for Other Services   ? ? ?  ?Precautions / Restrictions Precautions ?Precautions: Fall ?Restrictions ?Weight Bearing Restrictions: No  ?  ? ?Mobility ? Bed Mobility ?Overal bed mobility: Needs Assistance ?Bed Mobility: Sit to Supine ?  ?  ?  ?Sit to supine: Supervision ?  ?  ?  ? ?Transfers ?Overall transfer level: Needs assistance ?Equipment used: Rolling walker (2 wheels), None ?Transfers: Sit to/from Stand ?Sit to Stand: Supervision ?  ?  ?  ?  ?  ?  ?  ? ?Ambulation/Gait ?Ambulation/Gait assistance: Min guard, Supervision ?Gait Distance (Feet): 20 Feet (session limited due to drowiness s/p anxiolytic therapy) ?Assistive device: Rolling walker (2 wheels) ?  ?  ?  ?  ?  ? ? ?Stairs ?  ?  ?  ?  ?  ? ? ?Wheelchair Mobility ?  ? ?Modified Rankin (Stroke Patients Only) ?  ? ? ?  ?Balance Overall balance assessment: Modified Independent, Mild deficits observed, not formally tested ?  ?  ?  ?  ?  ?  ?  ?  ?  ?  ?  ?  ?  ?  ?  ?  ?  ?  ?  ? ?  ?Cognition Arousal/Alertness: Awake/alert ?Behavior During Therapy: [facility] for tasks assessed/performed ?Overall Cognitive Status: Within Functional Limits for tasks assessed ?  ?  ?  ?  ?  ?  ?  ?  ?  ?  ?  ?  ?  ?  ?  ?  ?  ?  ?  ? ?  ?  Exercises   ? ?  ?General Comments   ?  ?  ? ?Pertinent Vitals/Pain Pain Assessment ?Pain Assessment: No/denies pain  ? ? ?Home Living   ?  ?  ?  ?  ?  ?  ?  ?  ?  ?   ?  ?Prior Function    ?  ?  ?   ? ?PT Goals (current goals can now be found in the care plan section) Acute Rehab PT Goals ?Patient Stated Goal: to get his heart fixed ?PT Goal Formulation: With patient ?Time For Goal Achievement: 05/31/21 ?Potential to Achieve Goals: Good ?Progress towards PT goals: Progressing toward goals ? ?  ?Frequency ? ? ? Min 2X/week ? ? ? ?  ?PT Plan Current plan remains appropriate  ? ? ?Co-evaluation   ?  ?  ?  ?  ? ?  ?AM-PAC  PT "6 Clicks" Mobility   ?Outcome Measure ? Help needed turning from your back to your side while in a flat bed without using bedrails?: A Little ?Help needed moving from lying on your back to sitting on the side of a flat bed without using bedrails?: A Little ?Help needed moving to and from a bed to a chair (including a wheelchair)?: A Little ?Help needed standing up from a chair using your arms (e.g., wheelchair or bedside chair)?: A Little ?Help needed to walk in hospital room?: A Little ?Help needed climbing 3-5 steps with a railing? : A Little ?6 Click Score: 18 ? ?  ?End of Session   ?Activity Tolerance: Patient limited by lethargy;No increased pain;Patient tolerated treatment well ?Patient left: in bed;with call bell/phone within reach;with bed alarm set ?Nurse Communication: Mobility status ?PT Visit Diagnosis: Unsteadiness on feet (R26.81);Muscle weakness (generalized) (M62.81);Difficulty in walking, not elsewhere classified (R26.2) ?  ? ? ?Time: 7425-9563 ?PT Time Calculation (min) (ACUTE ONLY): 18 min ? ?Charges:  $Therapeutic Exercise: 8-22 mins          ?          ?4:31 PM, 05/22/21 ?Etta Grandchild, PT, DPT ?Physical Therapist - [facility]  ?[phone] (ASCOM)  ? ? ?Gwyn Hieronymus C ?05/22/2021, 4:28 PM ? ?

## 2021-05-22 NOTE — Progress Notes (Signed)
? ?Cardiology Progress Note  ? ?Patient Name: Timothy Riley [facility] ?Date of Encounter: 05/22/2021 ? ?Primary Cardiologist: Ida Rogue, MD ? ?Subjective  ? ?Frustrated by prolonged hospitalization and clinical worsening.  Abd very bloated, increasing lower ext edema, DOE, wt climbing.  Will start HD today - pending temp dialysis access. ? ?Inpatient Medications  ?  ?Scheduled Meds: ? amiodarone  400 mg Oral BID  ? apixaban  2.5 mg Oral BID  ? carvedilol  6.25 mg Oral BID WC  ? docusate sodium  200 mg Oral BID  ? feeding supplement (NEPRO CARB STEADY)  237 mL Oral TID  ? hydrocortisone cream   Topical BID  ? insulin aspart  0-9 Units Subcutaneous TID WC  ? polyethylene glycol  17 g Oral Daily  ? rosuvastatin  10 mg Oral Daily  ? ?Continuous Infusions: ? sodium chloride Stopped (05/17/21 1610)  ? sodium chloride    ? ?PRN Meds: ?acetaminophen **OR** acetaminophen, loperamide, melatonin, morphine injection, ondansetron **OR** ondansetron (ZOFRAN) IV, oxyCODONE-acetaminophen  ? ?Vital Signs  ?  ?Vitals:  ? 05/21/21 2051 05/22/21 0024 05/22/21 1683 05/22/21 7290  ?BP: (!) 112/49 123/71 115/66 137/74  ?Pulse: (!) 55 (!) 54 75 (!) 55  ?Resp: '20 20 18 16  '$ ?Temp: 97.7 ?F (36.5 ?C) 97.9 ?F (36.6 ?C) 98 ?F (36.7 ?C) (!) 97.4 ?F (36.3 ?C)  ?TempSrc: Oral Oral Oral   ?SpO2: 99% 99% 98% 98%  ?Weight:   94.8 kg   ?Height:      ? ? ?Intake/Output Summary (Last 24 hours) at 05/22/2021 0901 ?Last data filed at 05/22/2021 0818 ?Gross per 24 hour  ?Intake 840 ml  ?Output 550 ml  ?Net 290 ml  ? ?Filed Weights  ? 05/20/21 0323 05/21/21 0452 05/22/21 0452  ?Weight: 93 kg 92.9 kg 94.8 kg  ? ? ?Physical Exam  ? ?GEN: Obese, in no acute distress.  ?HEENT: Grossly normal.  ?Neck: Supple, no bruits/masses.  Mod elev JVP. ?Cardiac: RRR, no murmurs, rubs, or gallops. No clubbing, cyanosis, 2+ bilat LE edema.  Radials 2+, DP/PT 1+ and equal bilaterally.  ?Respiratory:  Respirations regular and unlabored, clear to auscultation bilaterally. ?GI: Firm,  protuberant, diffusely tender/uncomfortable.  Trace to 1+ bilat flank edema noted.  BS+x4. ?MS: no deformity or atrophy. ?Skin: warm and dry, no rash. ?Neuro:  Strength and sensation are intact. ?Psych: AAOx3.  Normal affect. ? ?Labs  ?  ?Chemistry ?Recent Labs  ?Lab 05/19/21 ?0332 05/19/21 ?1452 05/20/21 ?0353 05/21/21 ?0356 05/22/21 ?0410  ?NA 140  --  139 139 137  ?K 5.4*   < > 5.0 4.6 4.9  ?CL 100  --  100 99 99  ?CO2 30  --  '27 28 30  '$ ?GLUCOSE 143*  --  89 225* 168*  ?BUN 70*  --  75* 81* 90*  ?CREATININE 4.05*  --  4.55* 4.32* 4.37*  ?CALCIUM 8.5*  --  8.5* 8.4* 8.4*  ?ALBUMIN 3.2*  --   --   --   --   ?GFRNONAA 14*  --  12* 13* 13*  ?ANIONGAP 10  --  '12 12 8  '$ ? < > = values in this interval not displayed.  ?  ? ?Hematology ?Recent Labs  ?Lab 05/20/21 ?2111 05/21/21 ?0356 05/22/21 ?0410  ?WBC 6.4 5.4 6.5  ?RBC 3.81* 3.86* 3.88*  ?HGB 10.4* 10.7* 10.9*  ?HCT 34.8* 35.1* 35.3*  ?MCV 91.3 90.9 91.0  ?MCH 27.3 27.7 28.1  ?MCHC 29.9* 30.5 30.9  ?RDW 17.1* 17.2*  17.7*  ?PLT 195 188 192  ? ? ?Cardiac Enzymes  ?Recent Labs  ?Lab 05/13/21 ?2037 05/14/21 ?8099  ?TROPONINIHS 20* 19*  ?   ? ?BNP ?   ?Component Value Date/Time  ? BNP 1,597.6 (H) 05/13/2021 2034  ? ? ?Lipids  ?Lab Results  ?Component Value Date  ? CHOL 135 12/23/2018  ? HDL 28 (L) 12/23/2018  ? [place] 79 12/23/2018  ? TRIG 185 (H) 12/23/2018  ? CHOLHDL 4.8 12/23/2018  ? ? ?HbA1c  ?Lab Results  ?Component Value Date  ? HGBA1C 5.4 03/07/2021  ? ? ?Radiology  ?  ?US RENAL ? ?Result Date: 05/19/2021 ?CLINICAL DATA:  Chronic kidney disease. Worsening creatinine. Decreased urine output. EXAM: RENAL / URINARY TRACT ULTRASOUND COMPLETE COMPARISON:  CT abdomen and pelvis 03/07/2021; ultrasound renal 01/26/2019 FINDINGS: Right Kidney: Renal measurements: 11.0 x 5.1 x 6.4 cm = volume: 187 mL. Echogenicity within normal limits. There is an avascular hypoechoic cyst measuring up to 1.6 cm, measured up to 1.9 cm on 01/26/2019 ultrasound and demonstrating fluid density on  03/07/2021 CT. No hydronephrosis. Left Kidney: Renal measurements: 10.9 x 4.9 x 4.1 cm = volume: 115 mL mL. Echogenicity within normal limits. No mass or hydronephrosis visualized. Bladder: Only minimally distended, limiting evaluation. Other: Incidental note of ascites.  Ascites was also seen on prior CT. IMPRESSION:: IMPRESSION: 1. Unchanged right lower pole cyst measuring up to 1.6 cm. 2. No hydronephrosis. Electronically Signed   By: Yvonne Kendall M.D.   On: 05/19/2021 11:26  ? ?DG Abd Portable 1V ? ?Result Date: 05/20/2021 ?CLINICAL DATA:  Abdominal bloating. EXAM: PORTABLE ABDOMEN - 1 VIEW COMPARISON:  CT of the abdomen pelvis February 7 22 at 3 FINDINGS: The bowel gas pattern is normal. No radio-opaque calculi or other significant radiographic abnormality are seen. IMPRESSION: Negative. Electronically Signed   By: Fidela Salisbury M.D.   On: 05/20/2021 12:12  ? ?Telemetry  ?  ?RSR - Personally Reviewed ? ?Cardiac Studies  ? ?Limited echo 05/18/2021: ?1. Left ventricular ejection fraction, by estimation, is 30 %. The left  ?ventricle has moderately decreased function. The left ventricle  ?demonstrates global hypokinesis.  ? 2. Right ventricular systolic function is moderately reduced. The right  ?ventricular size is mildly enlarged. There is mildly elevated pulmonary  ?artery systolic pressure. The estimated right ventricular systolic  ?pressure is 83.3 mmHg.  ? 3. Left atrial size was mildly dilated.  ? 4. The mitral valve is normal in structure. No evidence of mitral valve  ?regurgitation.  ? 5. Tricuspid valve regurgitation is mild to moderate.  ? 6. The aortic valve was not well visualized. Aortic valve regurgitation  ?is not visualized.  ? 7. The inferior vena cava is normal in size with greater than 50%  ?respiratory variability, suggesting right atrial pressure of 3 mmHg. ?__________ ?  ?2D echo 05/14/2021: ?1. Left ventricular ejection fraction, by estimation, is 30 to 35%. The  ?left ventricle has  moderately decreased function. The left ventricle  ?demonstrates global hypokinesis. The left ventricular internal cavity size  ?was moderately dilated. Left  ?ventricular diastolic parameters are indeterminate.  ? 2. Right ventricular systolic function is moderately reduced. The right  ?ventricular size is moderately enlarged. There is moderately elevated  ?pulmonary artery systolic pressure. The estimated right ventricular  ?systolic pressure is 82.5 mmHg.  ? 3. Left atrial size was moderately dilated.  ? 4. The mitral valve is normal in structure. Moderate mitral valve  ?regurgitation. No evidence of mitral stenosis.  ?  5. Tricuspid valve regurgitation is moderate.  ? 6. The aortic valve has been repaired/replaced. 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 Aortic valve  ?regurgitation is not visualized. No aortic stenosis is present.  ? 7. The inferior vena cava is dilated in size with <50% respiratory  ?variability, suggesting right atrial pressure of 15 mmHg.  ?__________ ?  ?2D echo 09/13/2020: ?1. Left ventricular ejection fraction, by estimation, is 50 to 55%. The  ?left ventricle has low normal function. Left ventricular endocardial  ?border not optimally defined to evaluate regional wall motion. There is  ?mild left ventricular hypertrophy. Left  ?ventricular diastolic parameters are consistent with Grade II diastolic  ?dysfunction (pseudonormalization). Elevated left atrial pressure.  ? 2. Right ventricular systolic function is normal. The right ventricular  ?size is normal. There is moderately elevated pulmonary artery systolic  ?pressure.  ? 3. Left atrial size was moderately dilated.  ? 4. Right atrial size was mildly dilated.  ? 5. The mitral valve is abnormal. Mild mitral valve regurgitation. No  ?evidence of mitral stenosis.  ? 6. Tricuspid valve regurgitation is mild to moderate.  ? 7. The aortic valve has been repaired/replaced. Aortic valve  ?regurgitation is mild  to moderate. There is a 23 mm Edwards bioprosthetic  ?valve present in the aortic position. Procedure Date: 03/01/14. Aortic valve  ?mean gradient measures 13.0 mmHg.  ? 8. The inferior vena cava is dila

## 2021-05-22 NOTE — Progress Notes (Signed)
? ?PROGRESS NOTE ? ? ? ?Timothy Riley  BTD:[id number] DOB: 1940/08/20 DOA: 05/13/2021 ?PCP: Derinda Late, MD  ? ?Assessment & Plan: ?  ?Principal Problem: ?  Acute on chronic heart failure with preserved ejection fraction (HFpEF) ([place]) ?Active Problems: ?  Acute renal failure superimposed on stage 3b chronic kidney disease ([place]) ?  Atrial fibrillation ([place]) ?  Chronic anticoagulation ?  CAD (coronary artery disease) ?  H/O paroxysmal supraventricular tachycardia s/p ablation ?  Controlled type 2 diabetes mellitus without complication, without long-term current use of insulin ([place]) ?  Chronic anemia ?  Hypoglycemia ? ? 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 W/ positive fluid balance. Monitor I/Os. IV lasix x 1 ordered. Cardio following and recs apprec  ?  ?AKI on CKD stage IIIb: possibly secondary to cardiorenal. Cr is labile. With weight gain and fluid retention. Will  have temp cath placed today or tomorrow as per vasc surg. And will start HD today vs tomorrow depending on when temp cath is placed as per nephro. Nephro recs apprec  ?  ?Hypokalemia: resolved ? ?Hyperkalemia: resolved  ?  ?Hx of paroxysmal SVT: continue on amio, coreg  ? ?Persistent a. fib: continue on eliquis, amio & coreg. S/p cardioversion which was successful. Still in sinus  ?  ?DM2: well controlled, HbA1c 6.4. Continue on SSI w/ accuchecks  ?HbA1c 6.4, well controlled. Continue on SSI w/ accuchecks ? ?Memory deficit: especially short term memory. No formal dx of dementia ? ? ?DVT prophylaxis: eliquis  ?Code Status:  full  ?Family Communication: discussed pt's care w/ pt's son, [name], and answered his questions ?Disposition Plan: likely d/c back home. Pt refused PT ? ?Level of care: Progressive ? ?Status is: Inpatient ?Remains inpatient appropriate because: waiting to get temp HD cath placed by vascular surg to start HD   ? ? ? ?Consultants:  ?Cardio  ?Nephro  ? ?Procedures:   ? ?Antimicrobials:  ? ? ?Subjective: ?Pt c/o malaise  ? ?Objective: ?Vitals:  ? 05/21/21 1618 05/21/21 2051 05/22/21 0024 05/22/21 0452  ?BP: 114/63 (!) 112/49 123/71 115/66  ?Pulse: (!) 51 (!) 55 (!) 54 75  ?Resp: '16 20 20 18  '$ ?Temp: 97.8 ?F (36.6 ?C) 97.7 ?F (36.5 ?C) 97.9 ?F (36.6 ?C) 98 ?F (36.7 ?C)  ?TempSrc:  Oral Oral Oral  ?SpO2: 96% 99% 99% 98%  ?Weight:    94.8 kg  ?Height:      ? ? ?Intake/Output Summary (Last 24 hours) at 05/22/2021 0733 ?Last data filed at 05/22/2021 0000 ?Gross per 24 hour  ?Intake 600 ml  ?Output 350 ml  ?Net 250 ml  ? ?Filed Weights  ? 05/20/21 0323 05/21/21 0452 05/22/21 0452  ?Weight: 93 kg 92.9 kg 94.8 kg  ? ? ?Examination: ? ?General exam: Appears calm but uncomfortable  ?Respiratory system: diminished breath sounds b/l  ?Cardiovascular system: S1 & S2+. No rubs or clicks  ?Gastrointestinal system: Abd is soft, NT, distended & hypoactive bowel sounds ?Central nervous system: alert and awake. Moves all extremities  ?Psychiatry: judgement and insight appears at baseline. Appropriate mood and affect  ? ? ? ?Data Reviewed: I have personally reviewed following labs and imaging studies ? ?CBC: ?Recent Labs  ?Lab 05/18/21 ?0609 05/19/21 ?0332 05/20/21 ?3646 05/21/21 ?0356 05/22/21 ?0410  ?WBC 6.0 6.4 6.4 5.4 6.5  ?HGB 10.6* 10.8* 10.4* 10.7* 10.9*  ?HCT 34.7* 35.7* 34.8* 35.1* 35.3*  ?MCV 91.1 91.3 91.3 90.9 91.0  ?  PLT 172 186 195 188 192  ? ?Basic Metabolic Panel: ?Recent Labs  ?Lab 05/18/21 ?2831 05/19/21 ?0332 05/19/21 ?1452 05/20/21 ?0353 05/21/21 ?0356 05/22/21 ?0410  ?NA 140 140  --  139 139 137  ?K 4.8 5.4* 5.4* 5.0 4.6 4.9  ?CL 104 100  --  100 99 99  ?CO2 28 30  --  '27 28 30  '$ ?GLUCOSE 144* 143*  --  89 225* 168*  ?BUN 63* 70*  --  75* 81* 90*  ?CREATININE 3.44* 4.05*  --  4.55* 4.32* 4.37*  ?CALCIUM 8.4* 8.5*  --  8.5* 8.4* 8.4*  ? ?GFR: ?Estimated Creatinine Clearance: 14.6 mL/min (A) (by C-G formula based on SCr of 4.37 mg/dL (H)). ?Liver Function Tests: ?Recent Labs  ?Lab  05/19/21 ?0332  ?ALBUMIN 3.2*  ? ?No results for input(s): LIPASE, AMYLASE in the last 168 hours. ?No results for input(s): AMMONIA in the last 168 hours. ?Coagulation Profile: ?Recent Labs  ?Lab 05/16/21 ?0023  ?INR 2.1*  ? ?Cardiac Enzymes: ?No results for input(s): CKTOTAL, CKMB, CKMBINDEX, TROPONINI in the last 168 hours. ?BNP (last 3 results) ?No results for input(s): PROBNP in the last 8760 hours. ?HbA1C: ?No results for input(s): HGBA1C in the last 72 hours. ?CBG: ?Recent Labs  ?Lab 05/20/21 ?1922 05/21/21 ?0746 05/21/21 ?1146 05/21/21 ?1617 05/21/21 ?2021  ?GLUCAP 136* 158* 230* 165* 186*  ? ?Lipid Profile: ?No results for input(s): CHOL, HDL, LDLCALC, TRIG, CHOLHDL, LDLDIRECT in the last 72 hours. ?Thyroid Function Tests: ?No results for input(s): TSH, T4TOTAL, FREET4, T3FREE, THYROIDAB in the last 72 hours. ? ?Anemia Panel: ?No results for input(s): VITAMINB12, FOLATE, FERRITIN, TIBC, IRON, RETICCTPCT in the last 72 hours. ?Sepsis Labs: ?No results for input(s): PROCALCITON, LATICACIDVEN in the last 168 hours. ? ?No results found for this or any previous visit (from the past 240 hour(s)).  ? ? ? ? ? ?Radiology Studies: ?DG Abd Portable 1V ? ?Result Date: 05/20/2021 ?CLINICAL DATA:  Abdominal bloating. EXAM: PORTABLE ABDOMEN - 1 VIEW COMPARISON:  CT of the abdomen pelvis February 7 22 at 3 FINDINGS: The bowel gas pattern is normal. No radio-opaque calculi or other significant radiographic abnormality are seen. IMPRESSION: Negative. Electronically Signed   By: Fidela Salisbury M.D.   On: 05/20/2021 12:12   ? ? ? ? ? ?Scheduled Meds: ? amiodarone  400 mg Oral BID  ? apixaban  2.5 mg Oral BID  ? carvedilol  6.25 mg Oral BID WC  ? docusate sodium  200 mg Oral BID  ? feeding supplement (NEPRO CARB STEADY)  237 mL Oral TID  ? hydrocortisone cream   Topical BID  ? insulin aspart  0-9 Units Subcutaneous TID WC  ? polyethylene glycol  17 g Oral Daily  ? rosuvastatin  10 mg Oral Daily  ? ?Continuous Infusions: ?  sodium chloride Stopped (05/17/21 1610)  ? sodium chloride    ? ? ? LOS: 9 days  ? ? ?Time spent: 25 mins  ? ? ? ?Wyvonnia Dusky, MD ?[facility] ?Pager [phone] ? ?If 7PM-7AM, please contact night-coverage ?05/22/2021, 7:33 AM  ? ?

## 2021-05-23 ENCOUNTER — Inpatient Hospital Stay: Payer: Medicare Other

## 2021-05-23 ENCOUNTER — Encounter
Admission: EM | Disposition: A | Discharge status: Home Health | Payer: Self-pay | Source: Home / Self Care | Attending: Internal Medicine

## 2021-05-23 DIAGNOSIS — N170 Acute kidney failure with tubular necrosis: Secondary | ICD-10-CM | POA: Diagnosis not present

## 2021-05-23 DIAGNOSIS — N1832 Chronic kidney disease, stage 3b: Secondary | ICD-10-CM | POA: Diagnosis not present

## 2021-05-23 DIAGNOSIS — I2510 Atherosclerotic heart disease of native coronary artery without angina pectoris: Secondary | ICD-10-CM

## 2021-05-23 DIAGNOSIS — I4891 Unspecified atrial fibrillation: Secondary | ICD-10-CM

## 2021-05-23 DIAGNOSIS — N186 End stage renal disease: Secondary | ICD-10-CM | POA: Diagnosis not present

## 2021-05-23 DIAGNOSIS — I4819 Other persistent atrial fibrillation: Secondary | ICD-10-CM | POA: Diagnosis not present

## 2021-05-23 DIAGNOSIS — I120 Hypertensive chronic kidney disease with stage 5 chronic kidney disease or end stage renal disease: Secondary | ICD-10-CM | POA: Diagnosis not present

## 2021-05-23 DIAGNOSIS — I5021 Acute systolic (congestive) heart failure: Secondary | ICD-10-CM

## 2021-05-23 DIAGNOSIS — N179 Acute kidney failure, unspecified: Secondary | ICD-10-CM | POA: Diagnosis not present

## 2021-05-23 DIAGNOSIS — E1122 Type 2 diabetes mellitus with diabetic chronic kidney disease: Secondary | ICD-10-CM

## 2021-05-23 DIAGNOSIS — R413 Other amnesia: Secondary | ICD-10-CM

## 2021-05-23 DIAGNOSIS — I5033 Acute on chronic diastolic (congestive) heart failure: Secondary | ICD-10-CM | POA: Diagnosis not present

## 2021-05-23 HISTORY — PX: TEMPORARY DIALYSIS CATHETER: CATH118312

## 2021-05-23 LAB — CBC
HCT: 35.3 % — ABNORMAL LOW (ref 39.0–52.0)
Hemoglobin: 10.9 g/dL — ABNORMAL LOW (ref 13.0–17.0)
MCH: 28 pg (ref 26.0–34.0)
MCHC: 30.9 g/dL (ref 30.0–36.0)
MCV: 90.7 fL (ref 80.0–100.0)
Platelets: 174 10*3/uL (ref 150–400)
RBC: 3.89 MIL/uL — ABNORMAL LOW (ref 4.22–5.81)
RDW: 18.1 % — ABNORMAL HIGH (ref 11.5–15.5)
WBC: 5.1 10*3/uL (ref 4.0–10.5)
nRBC: 0 % (ref 0.0–0.2)

## 2021-05-23 LAB — BASIC METABOLIC PANEL
Anion gap: 9 (ref 5–15)
BUN: 88 mg/dL — ABNORMAL HIGH (ref 8–23)
CO2: 30 mmol/L (ref 22–32)
Calcium: 8.5 mg/dL — ABNORMAL LOW (ref 8.9–10.3)
Chloride: 99 mmol/L (ref 98–111)
Creatinine, Ser: 3.99 mg/dL — ABNORMAL HIGH (ref 0.61–1.24)
GFR, Estimated: 14 mL/min — ABNORMAL LOW (ref >60)
Glucose, Bld: 158 mg/dL — ABNORMAL HIGH (ref 70–99)
Potassium: 4.1 mmol/L (ref 3.5–5.1)
Sodium: 138 mmol/L (ref 135–145)

## 2021-05-23 LAB — GLUCOSE, CAPILLARY
Glucose-Capillary: 158 mg/dL — ABNORMAL HIGH (ref 70–99)
Glucose-Capillary: 202 mg/dL — ABNORMAL HIGH (ref 70–99)
Glucose-Capillary: 115 mg/dL — ABNORMAL HIGH (ref 70–99)
Glucose-Capillary: 144 mg/dL — ABNORMAL HIGH (ref 70–99)

## 2021-05-23 LAB — HEPATITIS B SURFACE ANTIBODY, QUANTITATIVE: Hep B S AB Quant (Post): <3.1 m[IU]/mL — ABNORMAL LOW (ref >9.9)

## 2021-05-23 SURGERY — TEMPORARY DIALYSIS CATHETER
Anesthesia: Moderate Sedation

## 2021-05-23 MED ORDER — PENTAFLUOROPROP-TETRAFLUOROETH EX AERO: 1.0000 "application " | INHALATION_SPRAY | CUTANEOUS | Status: DC | PRN

## 2021-05-23 MED ORDER — ALTEPLASE 2 MG IJ SOLR: 2.0000 mg | Freq: Once | INTRAMUSCULAR | Status: DC | PRN

## 2021-05-23 MED ORDER — LIDOCAINE HCL (PF) 1 % IJ SOLN: 5.0000 mL | INTRAMUSCULAR | Status: DC | PRN

## 2021-05-23 MED ORDER — HEPARIN SODIUM (PORCINE) 1000 UNIT/ML IJ SOLN
INTRAMUSCULAR | Status: AC
  Filled 2021-05-23: qty 10

## 2021-05-23 MED ORDER — SODIUM CHLORIDE 0.9 % IV SOLN: 100.0000 mL | INTRAVENOUS | Status: DC | PRN

## 2021-05-23 MED ORDER — LIDOCAINE-PRILOCAINE 2.5-2.5 % EX CREA: 1.0000 "application " | TOPICAL_CREAM | CUTANEOUS | Status: DC | PRN

## 2021-05-23 MED ORDER — HEPARIN SODIUM (PORCINE) 1000 UNIT/ML DIALYSIS
1000.0000 [IU] | INTRAMUSCULAR | Status: DC | PRN
  Administered 2021-05-23: 3200 [IU] via INTRAVENOUS_CENTRAL
  Filled 2021-05-23 (×2): qty 1

## 2021-05-23 MED ORDER — ALPRAZOLAM 0.25 MG PO TABS
0.2500 mg | ORAL_TABLET | Freq: Every day | ORAL | Status: DC | PRN
  Administered 2021-05-23 – 2021-05-25 (×4): 0.25 mg via ORAL
  Filled 2021-05-23 (×4): qty 1

## 2021-05-23 SURGICAL SUPPLY — 5 items
BIOPATCH RED 1 DISK 7.0 (GAUZE/BANDAGES/DRESSINGS) ×1 IMPLANT
CANNULA 5F STIFF (CANNULA) ×1 IMPLANT
COVER PROBE U/S 5X48 (MISCELLANEOUS) ×1 IMPLANT
KIT CATH DIALYSIS TRI 24X13 (CATHETERS) ×1 IMPLANT
PACK ANGIOGRAPHY (CUSTOM PROCEDURE TRAY) ×2 IMPLANT

## 2021-05-23 NOTE — H&P (View-Only) (Signed)
$'@LOGO'm$ @ ? ? ?MRN : [id number] ? ?Timothy Riley is a 81 y.o. (1940-07-19) male who presents with chief complaint of need access for dialysis. ? ?History of Present Illness:  ? ?I am asked to see the patient by Ms. Chantel Breeze. ? ?Patient is an 81 year old gentleman who presented to [facility] approximately 10 days ago with increasing volume overload and worsening renal function.  Interventions to try to restore his baseline renal function have not been successful and we have been asked to place a temporary dialysis catheter. ? ?At this point the patient is quite uremic and very confused.  Consented has been obtained from his son. ? ?Current Meds  ?Medication Sig  ? carvedilol (COREG) 12.5 MG tablet Take 1 tablet (12.5 mg total) by mouth 2 (two) times daily with a meal.  ? Cholecalciferol (VITAMIN D3 PO) Take by mouth daily.  ? doxazosin (CARDURA) 8 MG tablet TAKE 1 TABLET BY MOUTH TWICE A DAY  ? ELIQUIS 2.5 MG TABS tablet TAKE 1 TABLET BY MOUTH TWICE A DAY  ? glipiZIDE (GLUCOTROL) 10 MG tablet TAKE 1 TABLET BY MOUTH TWICE A DAY BEFORE A MEAL. (Patient taking differently: Take 10 mg by mouth daily before breakfast.)  ? pioglitazone (ACTOS) 30 MG tablet TAKE 1 TABLET BY MOUTH ONCE DAILY  ? potassium chloride SA (KLOR-CON M) 20 MEQ tablet Take 1 tablet (20 mEq total) by mouth 2 (two) times daily.  ? rosuvastatin (CRESTOR) 10 MG tablet Take 1 tablet (10 mg total) by mouth daily.  ? torsemide (DEMADEX) 20 MG tablet Take 2 tablets (40 mg total) by mouth 2 (two) times daily.  ? ? ?Past Medical History:  ?Diagnosis Date  ? 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 Aortic heart valve narrowing 12/31/2013  ? Overview:  Sp #23 pericardial edwards valve repalcement 2016   ? Aortic stenosis   ? a. 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 Glower MD @ [facility] via R thoracic approach; b. 03/2018 Echo: EF 60-65%, DD. Mildly dil LA. Nl fxn AoV prosthesis. Grad 75mHg.  ? Basal cell  carcinoma 05/15/2017  ? right nose above alar crease  ? CKD (chronic kidney disease) stage 3, GFR 30-59 ml/min (HCC) 06/17/2014  ? Diabetes mellitus without complication ([place]   ? Hypertension   ? 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 Low serum HDL 10/26/2015  ? Non-obstructive CAD (coronary artery disease)   ? a. 12/2013 Cath ([facility]): LAD 30p, RI 60, otw nl ([facility]; b. 03/2018 MV: EF 59%, no ischemia/scar.  ? Persistent atrial fibrillation ([place] 10/2017  ? a. CHA2DS2VASc = 5-->Eliquis; b. 12/2017 loaded w/amio-->DCCV.  ? PSVT (paroxysmal supraventricular tachycardia) ([place]   ? a. 05/2014 s/p RFCA @ [facility].  ? Right inguinal hernia   ? Urinary incontinence   ? ? ?Past Surgical History:  ?Procedure Laterality Date  ? AORTIC VALVE REPLACEMENT    ? CARDIAC CATHETERIZATION    ? CARDIOVERSION N/A 01/01/2018  ? Procedure: CARDIOVERSION;  Surgeon: GMinna Merritts MD;  Location: [facility] ORS;  Service: Cardiovascular;  Laterality: N/A;  ? CARDIOVERSION N/A 05/16/2021  ? Procedure: CARDIOVERSION;  Surgeon: ENelva Bush MD;  Location: [facility] ORS;  Service: Cardiovascular;  Laterality: N/A;  ? CARDIOVERSION N/A 05/17/2021  ? Procedure: CARDIOVERSION;  Surgeon: AKate Sable MD;  Location: [facility] ORS;  Service: Cardiovascular;  Laterality: N/A;  ? COLONOSCOPY  2010  ? heart valve replacment  2015  ? XI ROBOTIC ASSISTED INGUINAL HERNIA REPAIR WITH MESH Right 01/07/2019  ? Procedure: XI ROBOTIC ASSISTED INGUINAL HERNIA REPAIR WITH MESH;  Surgeon: Ronny Bacon, MD;  Location: [facility] ORS;  Service: General;  Laterality: Right;  ? ? ?Social History ?Social History  ? ?Tobacco Use  ? Smoking status: Former  ?  Packs/day: 0.50  ?  Years: 1.00  ?  Pack years: 0.50  ?  Types: Cigarettes  ?  Quit date: 01/30/1972  ?  Years since quitting: 49.3  ? Smokeless tobacco: Never  ? Tobacco comments:  ?  over 45 yrs ago  ?Vaping Use  ? Vaping Use: Never used  ?Substance Use Topics  ?  Alcohol use: No  ?  Alcohol/week: 0.0 standard drinks  ? Drug use: No  ? ? ?Family History ?Family History  ?Problem Relation Age of Onset  ? Cancer Mother   ? Stroke Father   ? ? ?Allergies  ?Allergen Reactions  ? Gabapentin Other (See Comments) and Rash  ?  dizzy ?dizzy  ? Hydralazine Anxiety and Rash  ? Hydralazine Hcl Rash  ? ? ? ?REVIEW OF SYSTEMS (Negative unless checked) ? ?Constitutional: '[]'$ Weight loss  '[]'$ Fever  '[]'$ Chills ?Cardiac: '[]'$ Chest pain   '[]'$ Chest pressure   '[]'$ Palpitations   '[]'$ Shortness of breath when laying flat   '[]'$ Shortness of breath with exertion. ?Vascular:  '[]'$ Pain in legs with walking   '[]'$ Pain in legs at rest  '[]'$ History of DVT   '[]'$ Phlebitis   '[]'$ Swelling in legs   '[]'$ Varicose veins   '[]'$ Non-healing ulcers ?Pulmonary:   '[]'$ Uses home oxygen   '[]'$ Productive cough   '[]'$ Hemoptysis   '[]'$ Wheeze  '[]'$ COPD   '[]'$ Asthma ?Neurologic:  '[]'$ Dizziness   '[]'$ Seizures   '[]'$ History of stroke   '[]'$ History of TIA  '[]'$ Aphasia   '[]'$ Vissual changes   '[]'$ Weakness or numbness in arm   '[]'$ Weakness or numbness in leg ?Musculoskeletal:   '[]'$ Joint swelling   '[]'$ Joint pain   '[]'$ Low back pain ?Hematologic:  '[]'$ Easy bruising  '[]'$ Easy bleeding   '[]'$ Hypercoagulable state   '[]'$ Anemic ?Gastrointestinal:  '[]'$ Diarrhea   '[]'$ Vomiting  '[]'$ Gastroesophageal reflux/heartburn   '[]'$ Difficulty swallowing. ?Genitourinary:  '[x]'$ Chronic kidney disease   '[]'$ Difficult urination  '[]'$ Frequent urination   '[]'$ Blood in urine ?Skin:  '[]'$ Rashes   '[]'$ Ulcers  ?Psychological:  '[]'$ History of anxiety   '[]'$  History of major depression. ? ?Physical Examination ? ?Vitals:  ? 05/23/21 0512 05/23/21 0740 05/23/21 1151 05/23/21 1437  ?BP: 134/76 130/73 128/75 136/75  ?Pulse: (!) 55 (!) 54 61 (!) 56  ?Resp: '17 16 16   '$ ?Temp:  (!) 97.4 ?F (36.3 ?C) 97.6 ?F (36.4 ?C) 97.6 ?F (36.4 ?C)  ?TempSrc:    Oral  ?SpO2: 98% 93% 96% 97%  ?Weight: 92.7 kg     ?Height:      ? ?Body mass index is 31.08 kg/m?. ?Gen: WD/WN, NAD ?Head: [place]/AT, No temporalis wasting.  ?Ear/Nose/Throat: Hearing grossly intact, nares w/o  erythema or drainage ?Eyes: PER, EOMI, sclera nonicteric.  ?Neck: Supple, no gross masses or lesions.  No JVD.  ?Pulmonary:  Good air movement, no audible wheezing, no use of accessory muscles.  ?Cardiac: RRR, precordium non-hyperdynamic. ?Vascular:   Left neck is clean dry and intact ?Vessel Right Left  ?Radial Palpable Palpable  ?Brachial Palpable Palpable  ?Gastrointestinal: soft, non-distended. No guarding/no peritoneal signs.  ?Musculoskeletal: M/S 5/5 throughout.  No deformity.  ?Neurologic: CN 2-12 intact. Pain and light touch intact in extremities.  Symmetrical.  Speech is fluent. Motor exam as listed above. ?  Psychiatric: Judgment intact, Mood & affect appropriate for pt's clinical situation. ?Dermatologic: No rashes or ulcers noted.  No changes consistent with cellulitis. ? ? ?CBC ?Lab Results  ?Component Value Date  ? WBC 5.1 05/23/2021  ? HGB 10.9 (L) 05/23/2021  ? HCT 35.3 (L) 05/23/2021  ? MCV 90.7 05/23/2021  ? PLT 174 05/23/2021  ? ? ?BMET ?   ?Component Value Date/Time  ? NA 138 05/23/2021 0455  ? NA 148 (H) 05/09/2021 1135  ? NA 140 05/28/2014 1725  ? K 4.1 05/23/2021 0455  ? K 3.8 05/28/2014 1725  ? CL 99 05/23/2021 0455  ? CL 107 05/28/2014 1725  ? CO2 30 05/23/2021 0455  ? CO2 26 05/28/2014 1725  ? GLUCOSE 158 (H) 05/23/2021 0455  ? GLUCOSE 160 (H) 05/28/2014 1725  ? BUN 88 (H) 05/23/2021 0455  ? BUN 37 (H) 05/09/2021 1135  ? BUN 25 (H) 05/28/2014 1725  ? CREATININE 3.99 (H) 05/23/2021 0455  ? CREATININE 2.57 (H) 12/23/2018 0000  ? CALCIUM 8.5 (L) 05/23/2021 0455  ? CALCIUM 9.0 05/28/2014 1725  ? GFRNONAA 14 (L) 05/23/2021 0455  ? GFRNONAA 23 (L) 12/23/2018 0000  ? GFRAA 26 (L) 12/23/2018 0000  ? ?Estimated Creatinine Clearance: 15.8 mL/min (A) (by C-G formula based on SCr of 3.99 mg/dL (H)). ? ?COAG ?Lab Results  ?Component Value Date  ? INR 2.1 (H) 05/16/2021  ? INR 1.4 (H) 12/31/2018  ? ? ?Radiology ?DG Chest 2 View ? ?Result Date: 05/13/2021 ?CLINICAL DATA:  Patient complains of fluid buildup  due to CHF since February. EXAM: CHEST - 2 VIEW COMPARISON:  March 09, 2021 FINDINGS: Multiple fractured sternal wires are seen. The cardiac silhouette is mildly enlarged and unchanged in size. Mild, chronic a

## 2021-05-23 NOTE — Progress Notes (Signed)
Physical Therapy Treatment ?Patient Details ?Name: Timothy Riley [facility] ?MRN: [id number] ?DOB: 1940-06-23 ?Today's Date: 05/23/2021 ? ? ?History of Present Illness Timothy Riley is an 61yoM admitted for acute on chronic heart failure and is currently s/p cardioversion. Medical history significant for Persistent A-fib with prior cardioversion, on Eliquis, s/p bioprosthetic AVR, G2 DD (EF 50 to 55% 08/2020), diabetes, nonobstructive CAD with low risk Myoview in 2020, HTN, CKD 3B,hospitalized from 2/8 to 03/15/2021 with acute gastroenteritis and CHF exacerbation  complicated by AKI related to diuretic therapy who presents to the ED  with a complaint of shortness of breath, abdominal distention and bilateral lower extremity edema that has not been responding to outpatient therapy. ? ?  ?PT Comments  ? ? Pt in bed on arrival, agreeable to session. RN reports he's to go for temp HD cath within the hour. Pt agreeable to short session prior to procedure. No physical assistance needed, high patient confidence in performing bed mobility and transfers; AMB performed with RW out of room to maximize tolerance and independence. Pt performs some stairs practice today as he has entry stairs at home. Pt return to supine at end of session, feet and heart elevated.  ? ?  ?Recommendations for follow up therapy are one component of a multi-disciplinary discharge planning process, led by the attending physician.  Recommendations may be updated based on patient status, additional functional criteria and insurance authorization. ? ?Follow Up Recommendations ? Home health PT ?  ?  ?Assistance Recommended at Discharge PRN  ?Patient can return home with the following A little help with walking and/or transfers;Help with stairs or ramp for entrance;A little help with bathing/dressing/bathroom ?  ?Equipment Recommendations ? Rolling walker (2 wheels)  ?  ?Recommendations for Other Services   ? ? ?  ?Precautions / Restrictions Precautions ?Precautions:  Fall ?Restrictions ?Weight Bearing Restrictions: No  ?  ? ?Mobility ? Bed Mobility ?Overal bed mobility: Modified Independent ?Bed Mobility: Supine to Sit, Sit to Supine ?  ?  ?Supine to sit: Modified independent (Device/Increase time) ?Sit to supine: Modified independent (Device/Increase time) ?  ?  ?  ? ?Transfers ?Overall transfer level: Needs assistance ?Equipment used: None ?Transfers: Sit to/from Stand ?Sit to Stand: Supervision ?  ?  ?  ?  ?  ?  ?  ? ?Ambulation/Gait ?Ambulation/Gait assistance: Min guard, Supervision ?Gait Distance (Feet): 300 Feet ?Assistive device: Rolling walker (2 wheels) ?  ?  ?  ?  ?General Gait Details: rt lateral flexion in mid stance (compensated trendelenburg) ? ? ?Stairs ?Stairs: Yes ?Stairs assistance: Min guard ?Stair Management: Two rails ?Number of Stairs: 5 ?  ? ? ?Wheelchair Mobility ?  ? ?Modified Rankin (Stroke Patients Only) ?  ? ? ?  ?Balance   ?  ?  ?  ?  ?  ?  ?  ?  ?  ?  ?  ?  ?  ?  ?  ?  ?  ?  ?  ? ?  ?Cognition Arousal/Alertness: Awake/alert ?Behavior During Therapy: [facility] for tasks assessed/performed ?Overall Cognitive Status: Within Functional Limits for tasks assessed ?  ?  ?  ?  ?  ?  ?  ?  ?  ?  ?  ?  ?  ?  ?  ?  ?  ?  ?  ? ?  ?Exercises   ? ?  ?General Comments   ?  ?  ? ?Pertinent Vitals/Pain Pain Assessment ?Pain Assessment: No/denies pain ?Pain Location: b/l LE,  secondary to edema  ? ? ?Home Living   ?  ?  ?  ?  ?  ?  ?  ?  ?  ?   ?  ?Prior Function    ?  ?  ?   ? ?PT Goals (current goals can now be found in the care plan section) Acute Rehab PT Goals ?Patient Stated Goal: to get his heart fixed ?PT Goal Formulation: With patient ?Time For Goal Achievement: 05/31/21 ?Potential to Achieve Goals: Good ?Progress towards PT goals: Progressing toward goals ? ?  ?Frequency ? ? ? Min 2X/week ? ? ? ?  ?PT Plan Current plan remains appropriate  ? ? ?Co-evaluation   ?  ?  ?  ?  ? ?  ?AM-PAC PT "6 Clicks" Mobility   ?Outcome Measure ? Help needed turning from your back  to your side while in a flat bed without using bedrails?: A Little ?Help needed moving from lying on your back to sitting on the side of a flat bed without using bedrails?: A Little ?Help needed moving to and from a bed to a chair (including a wheelchair)?: A Little ?Help needed standing up from a chair using your arms (e.g., wheelchair or bedside chair)?: A Little ?Help needed to walk in hospital room?: A Little ?Help needed climbing 3-5 steps with a railing? : A Little ?6 Click Score: 18 ? ?  ?End of Session Equipment Utilized During Treatment: Gait belt ?Activity Tolerance: No increased pain;Patient tolerated treatment well;Treatment limited secondary to medical complications (Comment) (going for HD cath placement soon) ?Patient left: in bed;with call bell/phone within reach ?Nurse Communication: Mobility status ?PT Visit Diagnosis: Unsteadiness on feet (R26.81);Muscle weakness (generalized) (M62.81);Difficulty in walking, not elsewhere classified (R26.2) ?  ? ? ?Time: 5956-3875 ?PT Time Calculation (min) (ACUTE ONLY): 13 min ? ?Charges:  $Therapeutic Exercise: 8-22 mins          ?          ?2:31 PM, 05/23/21 ?Etta Grandchild, PT, DPT ?Physical Therapist - [place] ?[facility]  ?[phone] (ASCOM)  ? ? ?Kadee Philyaw C ?05/23/2021, 2:29 PM ? ?

## 2021-05-23 NOTE — Op Note (Signed)
?  OPERATIVE NOTE ? ? ?PROCEDURE: ?Ultrasound guidance for vascular access left IJ vein ?Placement of a trialysis dialysis catheter left IJ vein ? ?PRE-OPERATIVE DIAGNOSIS: 1. Acute renal failure on chronic renal insufficiency ?2.  Uremia ? ?POST-OPERATIVE DIAGNOSIS: Same ? ?SURGEON: Katha Cabal, MD ? ?ASSISTANT(S): None ? ?ANESTHESIA: local ? ?ESTIMATED BLOOD LOSS: Minimal  ? ?FINDING(S): ?1.  None ? ?SPECIMEN(S):  None ? ?INDICATIONS:    ?Patient is a 81 y.o.male who presents with worsening renal function he now requires hemodialysis and therefore temporary catheter is being placed.  Risks and benefits were discussed, and informed consent was obtained.. ? ?DESCRIPTION: ?After obtaining full informed written consent, the patient was laid flat in the bed.  The left neck was sterilely prepped and draped in a sterile surgical field was created. The left internal jugular vein was visualized with ultrasound and found to be widely patent. It was then accessed under direct guidance without difficulty with a Seldinger needle and a permanent image was recorded. A J-wire was then placed. After skin nick and dilatation, a trialysis dialysis catheter was placed over the wire and the wire was removed. The lumens withdrew dark red nonpulsatile blood and flushed easily with sterile saline. The catheter was secured to the skin with 3 nylon sutures. Sterile dressing was placed. ? ?COMPLICATIONS: None ? ?CONDITION: Stable ? ?Timothy Riley ?05/23/2021 ?3:22 PM ? ?This note was created with Dragon Medical transcription system. Any errors in dictation are purely unintentional.  ?

## 2021-05-23 NOTE — Interval H&P Note (Signed)
History and Physical Interval Note: ? ?05/23/2021 ?3:21 PM ? ?Timothy Riley  has presented today for surgery, with the diagnosis of esrd.  The various methods of treatment have been discussed with the patient and family. After consideration of risks, benefits and other options for treatment, the patient has consented to  Procedure(s): ?TEMPORARY DIALYSIS CATHETER (N/A) as a surgical intervention.  The patient's history has been reviewed, patient examined, no change in status, stable for surgery.  I have reviewed the patient's chart and labs.  Questions were answered to the patient's satisfaction.   ? ? ?[name] ? ? ?

## 2021-05-23 NOTE — Progress Notes (Signed)
? ?Cardiology Progress Note  ? ?Patient Name: Timothy Riley [facility] ?Date of Encounter: 05/23/2021 ? ?Primary Cardiologist: Ida Rogue, MD ? ?Subjective  ? ?Noted good UO w/ dose of lasix yesterday.  Planned for temp HD cath this afternoon.  Feels that w/ UO  yesterday, abd bloating is sl less.  No dyspnea this AM. ? ?Inpatient Medications  ?  ?Scheduled Meds: ? amiodarone  400 mg Oral BID  ? apixaban  2.5 mg Oral BID  ? carvedilol  6.25 mg Oral BID WC  ? Chlorhexidine Gluconate Cloth  6 each Topical Q0600  ? docusate sodium  200 mg Oral BID  ? feeding supplement (NEPRO CARB STEADY)  237 mL Oral TID  ? hydrocortisone cream   Topical BID  ? insulin aspart  0-9 Units Subcutaneous TID WC  ? polyethylene glycol  17 g Oral Daily  ? rosuvastatin  10 mg Oral Daily  ? ?Continuous Infusions: ? sodium chloride Stopped (05/17/21 1610)  ? sodium chloride    ? ?PRN Meds: ?acetaminophen **OR** acetaminophen, ALPRAZolam, loperamide, melatonin, morphine injection, ondansetron **OR** ondansetron (ZOFRAN) IV, oxyCODONE-acetaminophen  ? ?Vital Signs  ?  ?Vitals:  ? 05/22/21 1947 05/22/21 2000 05/23/21 0512 05/23/21 0740  ?BP: 137/72  134/76 130/73  ?Pulse: (!) 59 (!) 47 (!) 55 (!) 54  ?Resp: '17  17 16  '$ ?Temp: 97.8 ?F (36.6 ?C)   (!) 97.4 ?F (36.3 ?C)  ?TempSrc: Rectal     ?SpO2: 98%  98% 93%  ?Weight:   92.7 kg   ?Height:      ? ? ?Intake/Output Summary (Last 24 hours) at 05/23/2021 6160 ?Last data filed at 05/23/2021 0500 ?Gross per 24 hour  ?Intake --  ?Output 1450 ml  ?Net -1450 ml  ? ?[place] Weights  ? 05/21/21 0452 05/22/21 0452 05/23/21 0512  ?Weight: 92.9 kg 94.8 kg 92.7 kg  ? ? ?Physical Exam  ? ?GEN: Obese, in no acute distress.  ?HEENT: Grossly normal.  ?Neck: Supple, no bruits or masses.  Moderately elevated JVP.  Cardiac: RRR, no murmurs, rubs, or gallops. No clubbing, cyanosis, 2+ bilateral lower extremity edema.  Radials 2+, DP/PT 1+ and equal bilaterally.  ?Respiratory:  Respirations regular and unlabored, clear to  auscultation bilaterally. ?GI: Slightly softer than yesterday but remains protuberant with 1+ bilateral flank edema.   ?MS: no deformity or atrophy. ?Skin: warm and dry, no rash. ?Neuro:  Strength and sensation are intact. ?Psych: AAOx3.  Normal affect. ? ?Labs  ?  ?Chemistry ?Recent Labs  ?Lab 05/19/21 ?0332 05/19/21 ?1452 05/21/21 ?0356 05/22/21 ?0410 05/23/21 ?0455  ?NA 140   < > 139 137 138  ?K 5.4*   < > 4.6 4.9 4.1  ?CL 100   < > 99 99 99  ?CO2 30   < > '28 30 30  '$ ?GLUCOSE 143*   < > 225* 168* 158*  ?BUN 70*   < > 81* 90* 88*  ?CREATININE 4.05*   < > 4.32* 4.37* 3.99*  ?CALCIUM 8.5*   < > 8.4* 8.4* 8.5*  ?ALBUMIN 3.2*  --   --   --   --   ?GFRNONAA 14*   < > 13* 13* 14*  ?ANIONGAP 10   < > '12 8 9  '$ ? < > = values in this interval not displayed.  ?  ? ?Hematology ?Recent Labs  ?Lab 05/21/21 ?0356 05/22/21 ?0410 05/23/21 ?0455  ?WBC 5.4 6.5 5.1  ?RBC 3.86* 3.88* 3.89*  ?HGB 10.7* 10.9* 10.9*  ?HCT  35.1* 35.3* 35.3*  ?MCV 90.9 91.0 90.7  ?MCH 27.7 28.1 28.0  ?MCHC 30.5 30.9 30.9  ?RDW 17.2* 17.7* 18.1*  ?PLT 188 192 174  ? ? ?Cardiac Enzymes  ?Recent Labs  ?Lab 05/13/21 ?2037 05/14/21 ?1829  ?TROPONINIHS 20* 19*  ?   ? ?BNP ?   ?Component Value Date/Time  ? BNP 1,597.6 (H) 05/13/2021 2034  ? ?Lipids  ?Lab Results  ?Component Value Date  ? CHOL 135 12/23/2018  ? HDL 28 (L) 12/23/2018  ? [place] 79 12/23/2018  ? TRIG 185 (H) 12/23/2018  ? CHOLHDL 4.8 12/23/2018  ? ? ?HbA1c  ?Lab Results  ?Component Value Date  ? HGBA1C 5.4 03/07/2021  ? ? ?Radiology  ?  ?US RENAL ? ?Result Date: 05/19/2021 ?CLINICAL DATA:  Chronic kidney disease. Worsening creatinine. Decreased urine output. EXAM: RENAL / URINARY TRACT ULTRASOUND COMPLETE COMPARISON:  CT abdomen and pelvis 03/07/2021; ultrasound renal 01/26/2019 FINDINGS: Right Kidney: Renal measurements: 11.0 x 5.1 x 6.4 cm = volume: 187 mL. Echogenicity within normal limits. There is an avascular hypoechoic cyst measuring up to 1.6 cm, measured up to 1.9 cm on 01/26/2019 ultrasound  and demonstrating fluid density on 03/07/2021 CT. No hydronephrosis. Left Kidney: Renal measurements: 10.9 x 4.9 x 4.1 cm = volume: 115 mL mL. Echogenicity within normal limits. No mass or hydronephrosis visualized. Bladder: Only minimally distended, limiting evaluation. Other: Incidental note of ascites.  Ascites was also seen on prior CT. IMPRESSION:: IMPRESSION: 1. Unchanged right lower pole cyst measuring up to 1.6 cm. 2. No hydronephrosis. Electronically Signed   By: Yvonne Kendall M.D.   On: 05/19/2021 11:26  ? ?DG Abd Portable 1V ? ?Result Date: 05/20/2021 ?CLINICAL DATA:  Abdominal bloating. EXAM: PORTABLE ABDOMEN - 1 VIEW COMPARISON:  CT of the abdomen pelvis February 7 22 at 3 FINDINGS: The bowel gas pattern is normal. No radio-opaque calculi or other significant radiographic abnormality are seen. IMPRESSION: Negative. Electronically Signed   By: Fidela Salisbury M.D.   On: 05/20/2021 12:12   ? ?Telemetry  ?  ?Sinus bradycardia, sinus rhythm- Personally Reviewed ? ?Cardiac Studies  ? ?Limited echo 05/18/2021: ?1. Left ventricular ejection fraction, by estimation, is 30 %. The left  ?ventricle has moderately decreased function. The left ventricle  ?demonstrates global hypokinesis.  ? 2. Right ventricular systolic function is moderately reduced. The right  ?ventricular size is mildly enlarged. There is mildly elevated pulmonary  ?artery systolic pressure. The estimated right ventricular systolic  ?pressure is 93.7 mmHg.  ? 3. Left atrial size was mildly dilated.  ? 4. The mitral valve is normal in structure. No evidence of mitral valve  ?regurgitation.  ? 5. Tricuspid valve regurgitation is mild to moderate.  ? 6. The aortic valve was not well visualized. Aortic valve regurgitation  ?is not visualized.  ? 7. The inferior vena cava is normal in size with greater than 50%  ?respiratory variability, suggesting right atrial pressure of 3 mmHg. ?__________ ?  ?2D echo 05/14/2021: ?1. Left ventricular ejection  fraction, by estimation, is 30 to 35%. The  ?left ventricle has moderately decreased function. The left ventricle  ?demonstrates global hypokinesis. The left ventricular internal cavity size  ?was moderately dilated. Left  ?ventricular diastolic parameters are indeterminate.  ? 2. Right ventricular systolic function is moderately reduced. The right  ?ventricular size is moderately enlarged. There is moderately elevated  ?pulmonary artery systolic pressure. The estimated right ventricular  ?systolic pressure is 16.9 mmHg.  ? 3. Left atrial size  was moderately dilated.  ? 4. The mitral valve is normal in structure. Moderate mitral valve  ?regurgitation. No evidence of mitral stenosis.  ? 5. Tricuspid valve regurgitation is moderate.  ? 6. The aortic valve has been repaired/replaced. 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 Aortic valve  ?regurgitation is not visualized. No aortic stenosis is present.  ? 7. The inferior vena cava is dilated in size with <50% respiratory  ?variability, suggesting right atrial pressure of 15 mmHg.  ?__________ ?  ?2D echo 09/13/2020: ?1. Left ventricular ejection fraction, by estimation, is 50 to 55%. The  ?left ventricle has low normal function. Left ventricular endocardial  ?border not optimally defined to evaluate regional wall motion. There is  ?mild left ventricular hypertrophy. Left  ?ventricular diastolic parameters are consistent with Grade II diastolic  ?dysfunction (pseudonormalization). Elevated left atrial pressure.  ? 2. Right ventricular systolic function is normal. The right ventricular  ?size is normal. There is moderately elevated pulmonary artery systolic  ?pressure.  ? 3. Left atrial size was moderately dilated.  ? 4. Right atrial size was mildly dilated.  ? 5. The mitral valve is abnormal. Mild mitral valve regurgitation. No  ?evidence of mitral stenosis.  ? 6. Tricuspid valve regurgitation is mild to moderate.  ? 7. The aortic valve  has been repaired/replaced. Aortic valve  ?regurgitation is mild to moderate. There is a 23 mm Edwards bioprosthetic  ?valve present in the aortic position. Procedure Date: 03/01/14. Aortic valve  ?mean gradient mea

## 2021-05-23 NOTE — Progress Notes (Signed)
Hemodialysis Post Treatment Note: ? ?Tx date: 05/23/2021 ?Tx time:2 hours ?Access:Left CVC ?UF Removed: No fluid removal as per order ? ?Note: ?HD completed. Tolerated well. No complications. Patient asymptomatic. ? ? ? ? ? ? ? ? ?  ?

## 2021-05-23 NOTE — Progress Notes (Signed)
?[facility]  ?ROUNDING NOTE  ? ?Subjective:  ? ?Timothy Riley is a 81 year old male with past medical history including CAD, hypertension, A-fib with prior cardioversion on Eliquis, and CKD 3B.  Patient presents to the emergency room with complaints of shortness of breath and increased lower extremity edema.  Patient has been admitted for CHF exacerbation ([place]) [I50.9] ?Hypervolemia, unspecified hypervolemia type [E87.70] ? ?Patient is known to our practice and was followed by Dr. Candiss Norse.  Patient currently states he does not currently follow a nephrologist due to continued decline of renal function.  Patient states that diuresis is managed by cardiologist, Dr. Rockey Situ.  ? ?Update ?Patient laying quietly in bed ?States he feels better today, less abdominal swelling ?Increased urine output from Furosemide dose  ? ?Creatinine 3.99 ?Urine output reduced to 1.65L preceding 24 hours ? ?Objective:  ?Vital signs in last 24 hours:  ?Temp:  [97.4 ?F (36.3 ?C)-97.8 ?F (36.6 ?C)] 97.4 ?F (36.3 ?C) (04/25 0740) ?Pulse Rate:  [47-59] 54 (04/25 0740) ?Resp:  [16-17] 16 (04/25 0740) ?BP: (126-137)/(70-76) 130/73 (04/25 0740) ?SpO2:  [93 %-100 %] 93 % (04/25 0740) ?Weight:  [92.7 kg] 92.7 kg (04/25 0512) ? ?Weight change: -2.132 kg ?[place] Weights  ? 05/21/21 0452 05/22/21 0452 05/23/21 0512  ?Weight: 92.9 kg 94.8 kg 92.7 kg  ? ? ?Intake/Output: ?I/O last 3 completed shifts: ?In: 480 [P.O.:480] ?Out: 1800 [Urine:1800] ?  ?Intake/Output this shift: ? No intake/output data recorded. ? ?Physical Exam: ?General: NAD  ?Head: Normocephalic, atraumatic. Moist oral mucosal membranes  ?Eyes: Anicteric  ?Lungs:  Basilar crackles, normal effort  ?Heart: Regular rate and rhythm  ?Abdomen:  Soft, nontender, distended  ?Extremities: 3+ peripheral edema. Abdominal wall edema  ?Neurologic: Nonfocal, moving all four extremities  ?Skin: No lesions  ?Access: None  ? ? ?Basic Metabolic Panel: ?Recent Labs  ?Lab 05/19/21 ?0332  05/19/21 ?1452 05/20/21 ?0353 05/21/21 ?0356 05/22/21 ?0410 05/23/21 ?0455  ?NA 140  --  139 139 137 138  ?K 5.4* 5.4* 5.0 4.6 4.9 4.1  ?CL 100  --  100 99 99 99  ?CO2 30  --  '27 28 30 30  '$ ?GLUCOSE 143*  --  89 225* 168* 158*  ?BUN 70*  --  75* 81* 90* 88*  ?CREATININE 4.05*  --  4.55* 4.32* 4.37* 3.99*  ?CALCIUM 8.5*  --  8.5* 8.4* 8.4* 8.5*  ? ? ? ?Liver Function Tests: ?Recent Labs  ?Lab 05/19/21 ?0332  ?ALBUMIN 3.2*  ? ? ?No results for input(s): LIPASE, AMYLASE in the last 168 hours. ?No results for input(s): AMMONIA in the last 168 hours. ? ?CBC: ?Recent Labs  ?Lab 05/19/21 ?0332 05/20/21 ?1749 05/21/21 ?0356 05/22/21 ?0410 05/23/21 ?0455  ?WBC 6.4 6.4 5.4 6.5 5.1  ?HGB 10.8* 10.4* 10.7* 10.9* 10.9*  ?HCT 35.7* 34.8* 35.1* 35.3* 35.3*  ?MCV 91.3 91.3 90.9 91.0 90.7  ?PLT 186 195 188 192 174  ? ? ? ?Cardiac Enzymes: ?No results for input(s): CKTOTAL, CKMB, CKMBINDEX, TROPONINI in the last 168 hours. ? ?BNP: ?Invalid input(s): POCBNP ? ?CBG: ?Recent Labs  ?Lab 05/22/21 ?0809 05/22/21 ?1148 05/22/21 ?1616 05/22/21 ?1933 05/23/21 ?0740  ?GLUCAP 172* 149* 179* 230* 158*  ? ? ? ?Microbiology: ?Results for orders placed or performed during the hospital encounter of 03/07/21  ?Resp Panel by RT-PCR (Flu A&B, Covid) Nasopharyngeal Swab     Status: None  ? Collection Time: 03/07/21 11:21 PM  ? Specimen: Nasopharyngeal Swab; Nasopharyngeal(NP) swabs in vial transport medium  ?Result  Value Ref Range Status  ? SARS Coronavirus 2 by RT PCR NEGATIVE NEGATIVE Final  ?  Comment: (NOTE) ?SARS-CoV-2 target nucleic acids are NOT DETECTED. ? ?The SARS-CoV-2 RNA is generally detectable in upper respiratory ?specimens during the acute phase of infection. The lowest ?concentration of SARS-CoV-2 viral copies this assay can detect is ?138 copies/mL. A negative result does not preclude SARS-Cov-2 ?infection and should not be used as the sole basis for treatment or ?other patient management decisions. A negative result may occur with   ?improper specimen collection/handling, submission of specimen other ?than nasopharyngeal swab, presence of viral mutation(s) within the ?areas targeted by this assay, and inadequate number of viral ?copies(<138 copies/mL). A negative result must be combined with ?clinical observations, patient history, and epidemiological ?information. The expected result is Negative. ? ?Fact Sheet for Patients:  ?EntrepreneurPulse.com.au ? ?Fact Sheet for Healthcare Providers:  ?IncredibleEmployment.be ? ?This test is no t yet approved or cleared by the [place] FDA and  ?has been authorized for detection and/or diagnosis of SARS-CoV-2 by ?FDA under an Emergency Use Authorization (EUA). This EUA will remain  ?in effect (meaning this test can be used) for the duration of the ?COVID-19 declaration under Section 564(b)(1) of the Act, 21 ?U.S.C.section 360bbb-3(b)(1), unless the authorization is terminated  ?or revoked sooner.  ? ? ?  ? Influenza A by PCR NEGATIVE NEGATIVE Final  ? Influenza B by PCR NEGATIVE NEGATIVE Final  ?  Comment: (NOTE) ?The Xpert Xpress SARS-CoV-2/FLU/RSV plus assay is intended as an aid ?in the diagnosis of influenza from Nasopharyngeal swab specimens and ?should not be used as a sole basis for treatment. Nasal washings and ?aspirates are unacceptable for Xpert Xpress SARS-CoV-2/FLU/RSV ?testing. ? ?Fact Sheet for Patients: ?EntrepreneurPulse.com.au ? ?Fact Sheet for Healthcare Providers: ?IncredibleEmployment.be ? ?This test is not yet approved or cleared by the [place] FDA and ?has been authorized for detection and/or diagnosis of SARS-CoV-2 by ?FDA under an Emergency Use Authorization (EUA). This EUA will remain ?in effect (meaning this test can be used) for the duration of the ?COVID-19 declaration under Section 564(b)(1) of the Act, 21 U.S.C. ?section 360bbb-3(b)(1), unless the authorization is terminated  or ?revoked. ? ?Performed at [facility], [place], ?[place] [address] ?  ?Culture, blood (routine x 2)     Status: None  ? Collection Time: 03/08/21  1:07 AM  ? Specimen: BLOOD  ?Result Value Ref Range Status  ? Specimen Description BLOOD RIGHT HAND  Final  ? Special Requests   Final  ?  BOTTLES DRAWN AEROBIC AND ANAEROBIC Blood Culture results may not be optimal due to an inadequate volume of blood received in culture bottles  ? Culture   Final  ?  NO GROWTH 5 DAYS ?Performed at [facility], [address], [place], [place] [address] ?  ? Report Status 03/13/2021 FINAL  Final  ?Urine Culture     Status: Abnormal  ? Collection Time: 03/08/21  1:17 AM  ? Specimen: Urine, Clean Catch  ?Result Value Ref Range Status  ? Specimen Description   Final  ?  URINE, CLEAN CATCH ?Performed at [facility], [address], [place], [place] [address] ?  ? Special Requests   Final  ?  NONE ?Performed at [facility], [address] [place], [place] [address] ?  ? Culture (A)  Final  ?  20,000 COLONIES/mL STREPTOCOCCUS AGALACTIAE ?TESTING AGAINST S. AGALACTIAE NOT ROUTINELY PERFORMED DUE TO PREDICTABILITY OF AMP/PEN/VAN SUSCEPTIBILITY. ?  Performed at [facility], [place] [address], [place], [place] [address] ?  ? Report Status 03/09/2021 FINAL  Final  ?Culture, blood (routine x 2)     Status: None  ? Collection Time: 03/08/21  2:02 AM  ? Specimen: BLOOD  ?Result Value Ref Range Status  ? Specimen Description BLOOD RIGHT HAND  Final  ? Special Requests   Final  ?  BOTTLES DRAWN AEROBIC AND ANAEROBIC Blood Culture adequate volume  ? Culture   Final  ?  NO GROWTH 5 DAYS ?Performed at [facility], [address] [place], [place] [address] ?  ? Report Status 03/13/2021 FINAL  Final  ? ? ?Coagulation Studies: ?No results for input(s): LABPROT, INR in the last 72 hours. ? ?Urinalysis: ?No results for input(s): COLORURINE, LABSPEC, [place], GLUCOSEU, HGBUR,  BILIRUBINUR, KETONESUR, PROTEINUR, UROBILINOGEN, NITRITE, LEUKOCYTESUR in the last 72 hours. ? ?Invalid input(s): APPERANCEUR  ? ? ?Imaging: ?No results found. ? ? ?Medications:  ? ? sodium chloride Stopped (05/17/21 1610)  ? sodium chloride

## 2021-05-23 NOTE — Care Management Important Message (Signed)
Important Message ? ?Patient Details  ?Name: Timothy Riley [facility] ?MRN: [id number] ?Date of Birth: March 02, 1940 ? ? ?Medicare Important Message Given:  Yes ? ?Patient asleep upon time of visit.  Copy of Medicare IM left on windowsill for reference. ? ? ?[name] ?05/23/2021, 11:04 AM ?

## 2021-05-23 NOTE — Progress Notes (Signed)
Mobility Specialist - Progress Note ? ? 05/23/21 1434  ?Mobility  ?Activity Off unit  ? ? ? ?Pt just finished PT session and is about to go to procedure for temp cath placement. Will attempt session another date/time.  ? ? ?Kathee Delton ?Mobility Specialist ?05/23/21, 2:35 PM ? ? ? ? ?

## 2021-05-23 NOTE — Consult Note (Signed)
$'@LOGO'K$ @ ? ? ?MRN : [id number] ? ?Timothy Riley is a 81 y.o. (Jun 15, 1940) male who presents with chief complaint of need access for dialysis. ? ?History of Present Illness:  ? ?I am asked to see the patient by Ms. Chantel Breeze. ? ?Patient is an 81 year old gentleman who presented to [facility] approximately 10 days ago with increasing volume overload and worsening renal function.  Interventions to try to restore his baseline renal function have not been successful and we have been asked to place a temporary dialysis catheter. ? ?At this point the patient is quite uremic and very confused.  Consented has been obtained from his son. ? ?Current Meds  ?Medication Sig  ? carvedilol (COREG) 12.5 MG tablet Take 1 tablet (12.5 mg total) by mouth 2 (two) times daily with a meal.  ? Cholecalciferol (VITAMIN D3 PO) Take by mouth daily.  ? doxazosin (CARDURA) 8 MG tablet TAKE 1 TABLET BY MOUTH TWICE A DAY  ? ELIQUIS 2.5 MG TABS tablet TAKE 1 TABLET BY MOUTH TWICE A DAY  ? glipiZIDE (GLUCOTROL) 10 MG tablet TAKE 1 TABLET BY MOUTH TWICE A DAY BEFORE A MEAL. (Patient taking differently: Take 10 mg by mouth daily before breakfast.)  ? pioglitazone (ACTOS) 30 MG tablet TAKE 1 TABLET BY MOUTH ONCE DAILY  ? potassium chloride SA (KLOR-CON M) 20 MEQ tablet Take 1 tablet (20 mEq total) by mouth 2 (two) times daily.  ? rosuvastatin (CRESTOR) 10 MG tablet Take 1 tablet (10 mg total) by mouth daily.  ? torsemide (DEMADEX) 20 MG tablet Take 2 tablets (40 mg total) by mouth 2 (two) times daily.  ? ? ?Past Medical History:  ?Diagnosis Date  ? 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 Aortic heart valve narrowing 12/31/2013  ? Overview:  Sp #23 pericardial edwards valve repalcement 2016   ? Aortic stenosis   ? a. 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 Glower MD @ [facility] via R thoracic approach; b. 03/2018 Echo: EF 60-65%, DD. Mildly dil LA. Nl fxn AoV prosthesis. Grad 1mHg.  ? Basal cell  carcinoma 05/15/2017  ? right nose above alar crease  ? CKD (chronic kidney disease) stage 3, GFR 30-59 ml/min (HCC) 06/17/2014  ? Diabetes mellitus without complication ([place]   ? Hypertension   ? 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 Low serum HDL 10/26/2015  ? Non-obstructive CAD (coronary artery disease)   ? a. 12/2013 Cath ([facility]): LAD 30p, RI 60, otw nl (per CT surgery note from [facility]; b. 03/2018 MV: EF 59%, no ischemia/scar.  ? Persistent atrial fibrillation ([place] 10/2017  ? a. CHA2DS2VASc = 5-->Eliquis; b. 12/2017 loaded w/amio-->DCCV.  ? PSVT (paroxysmal supraventricular tachycardia) ([place]   ? a. 05/2014 s/p RFCA @ [facility].  ? Right inguinal hernia   ? Urinary incontinence   ? ? ?Past Surgical History:  ?Procedure Laterality Date  ? AORTIC VALVE REPLACEMENT    ? CARDIAC CATHETERIZATION    ? CARDIOVERSION N/A 01/01/2018  ? Procedure: CARDIOVERSION;  Surgeon: GMinna Merritts MD;  Location: [facility] ORS;  Service: Cardiovascular;  Laterality: N/A;  ? CARDIOVERSION N/A 05/16/2021  ? Procedure: CARDIOVERSION;  Surgeon: ENelva Bush MD;  Location: [facility] ORS;  Service: Cardiovascular;  Laterality: N/A;  ? CARDIOVERSION N/A 05/17/2021  ? Procedure: CARDIOVERSION;  Surgeon: AKate Sable MD;  Location: [facility] ORS;  Service: Cardiovascular;  Laterality: N/A;  ? COLONOSCOPY  2010  ? heart valve replacment  2015  ? XI ROBOTIC ASSISTED INGUINAL HERNIA REPAIR WITH MESH Right 01/07/2019  ? Procedure: XI ROBOTIC ASSISTED INGUINAL HERNIA REPAIR WITH MESH;  Surgeon: Ronny Bacon, MD;  Location: [facility] ORS;  Service: General;  Laterality: Right;  ? ? ?Social History ?Social History  ? ?Tobacco Use  ? Smoking status: Former  ?  Packs/day: 0.50  ?  Years: 1.00  ?  Pack years: 0.50  ?  Types: Cigarettes  ?  Quit date: 01/30/1972  ?  Years since quitting: 49.3  ? Smokeless tobacco: Never  ? Tobacco comments:  ?  over 45 yrs ago  ?Vaping Use  ? Vaping Use: Never used  ?Substance Use Topics  ?  Alcohol use: No  ?  Alcohol/week: 0.0 standard drinks  ? Drug use: No  ? ? ?Family History ?Family History  ?Problem Relation Age of Onset  ? Cancer Mother   ? Stroke Father   ? ? ?Allergies  ?Allergen Reactions  ? Gabapentin Other (See Comments) and Rash  ?  dizzy ?dizzy  ? Hydralazine Anxiety and Rash  ? Hydralazine Hcl Rash  ? ? ? ?REVIEW OF SYSTEMS (Negative unless checked) ? ?Constitutional: '[]'$ Weight loss  '[]'$ Fever  '[]'$ Chills ?Cardiac: '[]'$ Chest pain   '[]'$ Chest pressure   '[]'$ Palpitations   '[]'$ Shortness of breath when laying flat   '[]'$ Shortness of breath with exertion. ?Vascular:  '[]'$ Pain in legs with walking   '[]'$ Pain in legs at rest  '[]'$ History of DVT   '[]'$ Phlebitis   '[]'$ Swelling in legs   '[]'$ Varicose veins   '[]'$ Non-healing ulcers ?Pulmonary:   '[]'$ Uses home oxygen   '[]'$ Productive cough   '[]'$ Hemoptysis   '[]'$ Wheeze  '[]'$ COPD   '[]'$ Asthma ?Neurologic:  '[]'$ Dizziness   '[]'$ Seizures   '[]'$ History of stroke   '[]'$ History of TIA  '[]'$ Aphasia   '[]'$ Vissual changes   '[]'$ Weakness or numbness in arm   '[]'$ Weakness or numbness in leg ?Musculoskeletal:   '[]'$ Joint swelling   '[]'$ Joint pain   '[]'$ Low back pain ?Hematologic:  '[]'$ Easy bruising  '[]'$ Easy bleeding   '[]'$ Hypercoagulable state   '[]'$ Anemic ?Gastrointestinal:  '[]'$ Diarrhea   '[]'$ Vomiting  '[]'$ Gastroesophageal reflux/heartburn   '[]'$ Difficulty swallowing. ?Genitourinary:  '[x]'$ Chronic kidney disease   '[]'$ Difficult urination  '[]'$ Frequent urination   '[]'$ Blood in urine ?Skin:  '[]'$ Rashes   '[]'$ Ulcers  ?Psychological:  '[]'$ History of anxiety   '[]'$  History of major depression. ? ?Physical Examination ? ?Vitals:  ? 05/23/21 0512 05/23/21 0740 05/23/21 1151 05/23/21 1437  ?BP: 134/76 130/73 128/75 136/75  ?Pulse: (!) 55 (!) 54 61 (!) 56  ?Resp: '17 16 16   '$ ?Temp:  (!) 97.4 ?F (36.3 ?C) 97.6 ?F (36.4 ?C) 97.6 ?F (36.4 ?C)  ?TempSrc:    Oral  ?SpO2: 98% 93% 96% 97%  ?Weight: 92.7 kg     ?Height:      ? ?Body mass index is 31.08 kg/m?. ?Gen: WD/WN, NAD ?Head: [place]/AT, No temporalis wasting.  ?Ear/Nose/Throat: Hearing grossly intact, nares w/o  erythema or drainage ?Eyes: PER, EOMI, sclera nonicteric.  ?Neck: Supple, no gross masses or lesions.  No JVD.  ?Pulmonary:  Good air movement, no audible wheezing, no use of accessory muscles.  ?Cardiac: RRR, precordium non-hyperdynamic. ?Vascular:   Left neck is clean dry and intact ?Vessel Right Left  ?Radial Palpable Palpable  ?Brachial Palpable Palpable  ?Gastrointestinal: soft, non-distended. No guarding/no peritoneal signs.  ?Musculoskeletal: M/S 5/5 throughout.  No deformity.  ?Neurologic: CN 2-12 intact. Pain and light touch intact in extremities.  Symmetrical.  Speech is fluent. Motor exam as listed above. ?  Psychiatric: Judgment intact, Mood & affect appropriate for pt's clinical situation. ?Dermatologic: No rashes or ulcers noted.  No changes consistent with cellulitis. ? ? ?CBC ?Lab Results  ?Component Value Date  ? WBC 5.1 05/23/2021  ? HGB 10.9 (L) 05/23/2021  ? HCT 35.3 (L) 05/23/2021  ? MCV 90.7 05/23/2021  ? PLT 174 05/23/2021  ? ? ?BMET ?   ?Component Value Date/Time  ? NA 138 05/23/2021 0455  ? NA 148 (H) 05/09/2021 1135  ? NA 140 05/28/2014 1725  ? K 4.1 05/23/2021 0455  ? K 3.8 05/28/2014 1725  ? CL 99 05/23/2021 0455  ? CL 107 05/28/2014 1725  ? CO2 30 05/23/2021 0455  ? CO2 26 05/28/2014 1725  ? GLUCOSE 158 (H) 05/23/2021 0455  ? GLUCOSE 160 (H) 05/28/2014 1725  ? BUN 88 (H) 05/23/2021 0455  ? BUN 37 (H) 05/09/2021 1135  ? BUN 25 (H) 05/28/2014 1725  ? CREATININE 3.99 (H) 05/23/2021 0455  ? CREATININE 2.57 (H) 12/23/2018 0000  ? CALCIUM 8.5 (L) 05/23/2021 0455  ? CALCIUM 9.0 05/28/2014 1725  ? GFRNONAA 14 (L) 05/23/2021 0455  ? GFRNONAA 23 (L) 12/23/2018 0000  ? GFRAA 26 (L) 12/23/2018 0000  ? ?Estimated Creatinine Clearance: 15.8 mL/min (A) (by C-G formula based on SCr of 3.99 mg/dL (H)). ? ?COAG ?Lab Results  ?Component Value Date  ? INR 2.1 (H) 05/16/2021  ? INR 1.4 (H) 12/31/2018  ? ? ?Radiology ?DG Chest 2 View ? ?Result Date: 05/13/2021 ?CLINICAL DATA:  Patient complains of fluid buildup  due to CHF since February. EXAM: CHEST - 2 VIEW COMPARISON:  March 09, 2021 FINDINGS: Multiple fractured sternal wires are seen. The cardiac silhouette is mildly enlarged and unchanged in size. Mild, chronic a

## 2021-05-23 NOTE — Progress Notes (Addendum)
? ?PROGRESS NOTE ? ? ?HPI was taken from Dr. Damita Riley: ?Timothy Riley is a 81 y.o. male with medical history significant for Persistent A-fib with prior cardioversion, on Eliquis, s/p bioprosthetic AVR, G2 DD (EF 50 to 55% 08/2020), diabetes, nonobstructive CAD with low risk Myoview in 2020, HTN, CKD 3B,hospitalized from 2/8 to 03/15/2021 with acute gastroenteritis and CHF exacerbation  complicated by AKI related to diuretic therapy who presents to the ED  with a complaint of shortness of breath, abdominal distention and bilateral lower extremity edema that has not been responding to outpatient therapy.  He was last seen by cardiologist, Dr. Rockey Riley on 05/09/2021 and review of the note reveals that the plan was to arrange cardioversion to address A-fib.  His Coreg was also increased.Marland Kitchen  He denies chest pain, cough, fever or chills.  Denies vomiting or diarrhea. ?ED course and data review: Tachypneic to 26, tachycardic to 101 with otherwise normal vitals.  Troponin 20 and BNP 1597.  Creatinine 2.65 up from baseline of 1.68.  Hemoglobin 10.7 which is baseline.  Labs otherwise unremarkable.  EKG, personally viewed and interpreted: A-fib at 96 with RBBB and T wave inversion anterior leads.  Chest x-ray showed chronic appearing increased lung markings with mild right basilar atelectasis. ? ?As per Dr. Jimmye Riley 4/19-4/25/23: Pt was found to have CHF exacerbation and was treated w/ IV lasix. IV lasix had to be held secondary to AKI on CKD. Cr continued to rise despite lasix being held. Nephro was consulted and has now recommend that pt start dialysis. Will have temp cath placed today by vascular surgery to start dialysis.  ? ? ? ?Timothy Riley  NUU:[id number] DOB: [birth year] DOA: 05/13/2021 ?PCP: Timothy Late, MD  ? ?Assessment & Plan: ?  ?Principal Problem: ?  Acute on chronic heart failure with preserved ejection fraction (HFpEF) ([place]) ?Active Problems: ?  Acute renal failure superimposed on stage 3b chronic kidney  disease ([place]) ?  Atrial fibrillation ([place]) ?  Chronic anticoagulation ?  CAD (coronary artery disease) ?  H/O paroxysmal supraventricular tachycardia s/p ablation ?  Controlled type 2 diabetes mellitus without complication, without long-term current use of insulin ([place]) ?  Chronic anemia ?  Hypoglycemia ?  Memory deficit ? ? 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 Neg approx 1.8L fluid balance. Will get temp HD cath today as per vascular surg. Will start HD later today or tomorrow. Monitor I/Os. Cardio following and recs apprec  ? ?AKI on CKD stage IIIb: possibly secondary to cardiorenal. Cr is labile. With weight gain and fluid retention. Will have temp HD cath placed today as per vasc surg. Will start HD today or tomorrow. Nephro recs apprec  ?  ?Hypokalemia: resolved ? ?Hyperkalemia: resolved  ?  ?Hx of paroxysmal SVT: continue on coreg, amio  ? ?Persistent a. fib: in sinus rhythm currently. S/p cardioversion which was successful. Continue on coreg, amoi & eliquis  ?  ?DM2: HbA1c 6.4, well controlled. Continue on SSI w/ accuchecks ? ?Memory deficit: especially short term memory. No formal dx of dementia  ?especially short term memory. No formal dx of dementia ? ? ?DVT prophylaxis: eliquis  ?Code Status:  full  ?Family Communication: discussed pt's care w/ pt's son, Timothy Riley, and answered his questions ?Disposition Plan: likely d/c back home. Pt refused PT ? ?Level of care: Progressive ? ?Status is: Inpatient ?Remains inpatient appropriate because: waiting to get temp HD cath placed by vascular surg  to start HD   ? ? ? ?Consultants:  ?Cardio  ?Nephro  ?Vascular surg  ? ?Procedures:  ? ?Antimicrobials:  ? ? ?Subjective: ?Pt c/o fatigue  ? ?Objective: ?Vitals:  ? 05/23/21 0512 05/23/21 0740 05/23/21 1151 05/23/21 1437  ?BP: 134/76 130/73 128/75 136/75  ?Pulse: (!) 55 (!) 54 61 (!) 56  ?Resp: '17 16 16   '$ ?Temp:  (!) 97.4 ?F (36.3 ?C) 97.6 ?F (36.4 ?C) 97.6 ?F  (36.4 ?C)  ?TempSrc:    Oral  ?SpO2: 98% 93% 96% 97%  ?Weight: 92.7 kg     ?Height:      ? ? ?Intake/Output Summary (Last 24 hours) at 05/23/2021 1512 ?Last data filed at 05/23/2021 1153 ?Gross per 24 hour  ?Intake 240 ml  ?Output 1825 ml  ?Net -1585 ml  ? ?[place] Weights  ? 05/21/21 0452 05/22/21 0452 05/23/21 0512  ?Weight: 92.9 kg 94.8 kg 92.7 kg  ? ? ?Examination: ? ?General exam: Appears calm & comfortable. ?Respiratory system: decreased breath sounds b/l   ?Cardiovascular system: S1 & S2+. No rubs or clicks  ?Gastrointestinal system:  Abd is soft, NT, distended & normal bowel sounds  ?Central nervous system: Alert and awake. Moves all extremities  ?Psychiatry: judgement and insight are at baseline. Flat mood and affect ? ? ? ?Data Reviewed: I have personally reviewed following labs and imaging studies ? ?CBC: ?Recent Labs  ?Lab 05/19/21 ?0332 05/20/21 ?9470 05/21/21 ?0356 05/22/21 ?0410 05/23/21 ?0455  ?WBC 6.4 6.4 5.4 6.5 5.1  ?HGB 10.8* 10.4* 10.7* 10.9* 10.9*  ?HCT 35.7* 34.8* 35.1* 35.3* 35.3*  ?MCV 91.3 91.3 90.9 91.0 90.7  ?PLT 186 195 188 192 174  ? ?Basic Metabolic Panel: ?Recent Labs  ?Lab 05/19/21 ?0332 05/19/21 ?1452 05/20/21 ?0353 05/21/21 ?0356 05/22/21 ?0410 05/23/21 ?0455  ?NA 140  --  139 139 137 138  ?K 5.4* 5.4* 5.0 4.6 4.9 4.1  ?CL 100  --  100 99 99 99  ?CO2 30  --  '27 28 30 30  '$ ?GLUCOSE 143*  --  89 225* 168* 158*  ?BUN 70*  --  75* 81* 90* 88*  ?CREATININE 4.05*  --  4.55* 4.32* 4.37* 3.99*  ?CALCIUM 8.5*  --  8.5* 8.4* 8.4* 8.5*  ? ?GFR: ?Estimated Creatinine Clearance: 15.8 mL/min (A) (by C-G formula based on SCr of 3.99 mg/dL (H)). ?Liver Function Tests: ?Recent Labs  ?Lab 05/19/21 ?0332  ?ALBUMIN 3.2*  ? ?No results for input(s): LIPASE, AMYLASE in the last 168 hours. ?No results for input(s): AMMONIA in the last 168 hours. ?Coagulation Profile: ?No results for input(s): INR, PROTIME in the last 168 hours. ? ?Cardiac Enzymes: ?No results for input(s): CKTOTAL, CKMB, CKMBINDEX, TROPONINI  in the last 168 hours. ?BNP (last 3 results) ?No results for input(s): PROBNP in the last 8760 hours. ?HbA1C: ?No results for input(s): HGBA1C in the last 72 hours. ?CBG: ?Recent Labs  ?Lab 05/22/21 ?1148 05/22/21 ?1616 05/22/21 ?1933 05/23/21 ?0740 05/23/21 ?1152  ?GLUCAP 149* 179* 230* 158* 202*  ? ?Lipid Profile: ?No results for input(s): CHOL, HDL, LDLCALC, TRIG, CHOLHDL, LDLDIRECT in the last 72 hours. ?Thyroid Function Tests: ?No results for input(s): TSH, T4TOTAL, FREET4, T3FREE, THYROIDAB in the last 72 hours. ? ?Anemia Panel: ?No results for input(s): VITAMINB12, FOLATE, FERRITIN, TIBC, IRON, RETICCTPCT in the last 72 hours. ?Sepsis Labs: ?No results for input(s): PROCALCITON, LATICACIDVEN in the last 168 hours. ? ?No results found for this or any previous visit (from the past 240 hour(s)).  ? ? ? ? ? ?  Radiology Studies: ?PERIPHERAL VASCULAR CATHETERIZATION ? ?Result Date: 05/23/2021 ?See surgical note for result.  ? ? ? ? ? ?Scheduled Meds: ? [MAR Hold] amiodarone  400 mg Oral BID  ? [MAR Hold] apixaban  2.5 mg Oral BID  ? [MAR Hold] carvedilol  6.25 mg Oral BID WC  ? [MAR Hold] Chlorhexidine Gluconate Cloth  6 each Topical Q0600  ? [MAR Hold] docusate sodium  200 mg Oral BID  ? [MAR Hold] feeding supplement (NEPRO CARB STEADY)  237 mL Oral TID  ? [MAR Hold] hydrocortisone cream   Topical BID  ? [MAR Hold] insulin aspart  0-9 Units Subcutaneous TID WC  ? [MAR Hold] polyethylene glycol  17 g Oral Daily  ? [MAR Hold] rosuvastatin  10 mg Oral Daily  ? ?Continuous Infusions: ? sodium chloride Stopped (05/17/21 1610)  ? sodium chloride    ? [MAR Hold] sodium chloride    ? [MAR Hold] sodium chloride    ? ? ? LOS: 10 days  ? ? ?Time spent: 26 mins  ? ? ? ?Wyvonnia Dusky, MD ?[facility] ?Pager [phone] ? ?If 7PM-7AM, please contact night-coverage ?05/23/2021, 3:12 PM  ? ?

## 2021-05-24 ENCOUNTER — Encounter: Payer: Self-pay | Admitting: Vascular Surgery

## 2021-05-24 DIAGNOSIS — I5021 Acute systolic (congestive) heart failure: Secondary | ICD-10-CM | POA: Diagnosis not present

## 2021-05-24 LAB — CBC
HCT: 32.6 % — ABNORMAL LOW (ref 39.0–52.0)
Hemoglobin: 10 g/dL — ABNORMAL LOW (ref 13.0–17.0)
MCH: 27.4 pg (ref 26.0–34.0)
MCHC: 30.7 g/dL (ref 30.0–36.0)
MCV: 89.3 fL (ref 80.0–100.0)
Platelets: 155 10*3/uL (ref 150–400)
RBC: 3.65 MIL/uL — ABNORMAL LOW (ref 4.22–5.81)
RDW: 18.2 % — ABNORMAL HIGH (ref 11.5–15.5)
WBC: 6.1 10*3/uL (ref 4.0–10.5)
nRBC: 0 % (ref 0.0–0.2)

## 2021-05-24 LAB — BASIC METABOLIC PANEL
Anion gap: 5 (ref 5–15)
BUN: 66 mg/dL — ABNORMAL HIGH (ref 8–23)
CO2: 28 mmol/L (ref 22–32)
Calcium: 7.9 mg/dL — ABNORMAL LOW (ref 8.9–10.3)
Chloride: 102 mmol/L (ref 98–111)
Creatinine, Ser: 2.83 mg/dL — ABNORMAL HIGH (ref 0.61–1.24)
GFR, Estimated: 22 mL/min — ABNORMAL LOW (ref >60)
Glucose, Bld: 228 mg/dL — ABNORMAL HIGH (ref 70–99)
Potassium: 3.7 mmol/L (ref 3.5–5.1)
Sodium: 135 mmol/L (ref 135–145)

## 2021-05-24 LAB — GLUCOSE, CAPILLARY
Glucose-Capillary: 201 mg/dL — ABNORMAL HIGH (ref 70–99)
Glucose-Capillary: 220 mg/dL — ABNORMAL HIGH (ref 70–99)
Glucose-Capillary: 77 mg/dL (ref 70–99)
Glucose-Capillary: 119 mg/dL — ABNORMAL HIGH (ref 70–99)
Glucose-Capillary: 129 mg/dL — ABNORMAL HIGH (ref 70–99)

## 2021-05-24 LAB — HEPATITIS B CORE ANTIBODY, TOTAL

## 2021-05-24 LAB — HEPATITIS B SURFACE ANTIGEN

## 2021-05-24 MED ORDER — HEPARIN SODIUM (PORCINE) 1000 UNIT/ML IJ SOLN
INTRAMUSCULAR | Status: AC
  Administered 2021-05-24: 3200 [IU]
  Filled 2021-05-24: qty 10

## 2021-05-24 MED ORDER — QUETIAPINE FUMARATE 25 MG PO TABS
25.0000 mg | ORAL_TABLET | Freq: Every day | ORAL | Status: DC
  Administered 2021-05-24: 25 mg via ORAL
  Filled 2021-05-24: qty 1

## 2021-05-24 NOTE — TOC Progression Note (Signed)
Transition of Care (TOC) - Progression Note  ? ? ?Patient Details  ?Name: Montoya Brandel [facility] ?MRN: [id number] ?Date of Birth: [birth year] ? ?Transition of Care (TOC) CM/SW Contact  ?Laurena Slimmer, RN ?Phone Number: ?05/24/2021, 2:20 PM ? ?Clinical Narrative:    ?Recommendation per therapy note is for HHPT. Attempt to contact son, [name] @ [phone]. No answer. Left message requesting call back regarding choice of [place] agency.  ? ?  ?  ? ?Expected Discharge Plan and Services ?  ?  ?  ?  ?  ?                ?  ?  ?  ?  ?  ?  ?  ?  ?  ?  ? ? ?Social Determinants of Health (SDOH) Interventions ?  ? ?Readmission Risk Interventions ?   ? View : No data to display.  ?  ?  ?  ? ? ?

## 2021-05-24 NOTE — Progress Notes (Signed)
Hemodialysis Post Treatment Note: ? ?Tx date:05/24/2021 ?Tx time: 2 hours and 30 minutes ?Access: Left CVC ?UF Removed: 558m ? ?Note: HD completed. Tolerated well. No complications. Patient asymptomatic ? ? ? ? ? ? ? ? ?  ?

## 2021-05-24 NOTE — Progress Notes (Addendum)
?[facility]  ?ROUNDING NOTE  ? ?Subjective:  ? ?Timothy Riley is a 81 year old male with past medical history including CAD, hypertension, A-fib with prior cardioversion on Eliquis, and CKD 3B.  Patient presents to the emergency room with complaints of shortness of breath and increased lower extremity edema.  Patient has been admitted for CHF exacerbation ([place]) [I50.9] ?Hypervolemia, unspecified hypervolemia type [E87.70] ? ?Patient is known to our practice and was followed by Dr. Candiss Norse.  Patient currently states he does not currently follow a nephrologist due to continued decline of renal function.  Patient states that diuresis is managed by cardiologist, Dr. Rockey Situ.  ? ?Update ?Patient resting quietly ?Alert and oriented  ?Denies soreness from PermCath placement yesterday ?Dialysis treatment yesterday, tolerated well no UF ? ?Creatinine 2.83 ?UOP 727m ? ?Objective:  ?Vital signs in last 24 hours:  ?Temp:  [97.6 ?F (36.4 ?C)-98.9 ?F (37.2 ?C)] 98.2 ?F (36.8 ?C) (04/26 1145) ?Pulse Rate:  [55-88] 57 (04/26 1145) ?Resp:  [14-44] 18 (04/26 1145) ?BP: (126-148)/(66-101) 139/79 (04/26 1145) ?SpO2:  [93 %-100 %] 100 % (04/26 1145) ?Weight:  [89.1 kg-92.8 kg] 89.1 kg (04/26 0427) ? ?Weight change: 0.035 kg ?Filed Weights  ? 05/23/21 1545 05/23/21 1800 05/24/21 0427  ?Weight: 92.8 kg 89.1 kg 89.1 kg  ? ? ?Intake/Output: ?I/O last 3 completed shifts: ?In: 240 [P.O.:240] ?Out: 1925 [[[id number]]?  ?Intake/Output this shift: ? Total I/O ?In: 240 [P.O.:240] ?Out: 450 [Urine:450] ? ?Physical Exam: ?General: NAD  ?Head: Normocephalic, atraumatic. Moist oral mucosal membranes  ?Eyes: Anicteric  ?Lungs:  Clear to auscultation, normal effort  ?Heart: Regular rate and rhythm  ?Abdomen:  Soft, nontender, distended  ?Extremities: 3+ peripheral edema. Abdominal wall edema  ?Neurologic: Nonfocal, moving all four extremities  ?Skin: No lesions  ?Access: Left IJ temp PermCath placed on 05/23/2021 by Dr. SDelana Meyer ? ? ?Basic  Metabolic Panel: ?Recent Labs  ?Lab 05/20/21 ?0858804/23/23 ?0356 05/22/21 ?0410 05/23/21 ?0455 05/24/21 ?05027 ?NA 139 139 137 138 135  ?K 5.0 4.6 4.9 4.1 3.7  ?CL 100 99 99 99 102  ?CO2 '27 28 30 30 28  '$ ?GLUCOSE 89 225* 168* 158* 228*  ?BUN 75* 81* 90* 88* 66*  ?CREATININE 4.55* 4.32* 4.37* 3.99* 2.83*  ?CALCIUM 8.5* 8.4* 8.4* 8.5* 7.9*  ? ? ? ?Liver Function Tests: ?Recent Labs  ?Lab 05/19/21 ?0332  ?ALBUMIN 3.2*  ? ? ?No results for input(s): LIPASE, AMYLASE in the last 168 hours. ?No results for input(s): AMMONIA in the last 168 hours. ? ?CBC: ?Recent Labs  ?Lab 05/20/21 ?0741204/23/23 ?0356 05/22/21 ?0410 05/23/21 ?0455 05/24/21 ?08786 ?WBC 6.4 5.4 6.5 5.1 6.1  ?HGB 10.4* 10.7* 10.9* 10.9* 10.0*  ?HCT 34.8* 35.1* 35.3* 35.3* 32.6*  ?MCV 91.3 90.9 91.0 90.7 89.3  ?PLT 195 188 192 174 155  ? ? ? ?Cardiac Enzymes: ?No results for input(s): CKTOTAL, CKMB, CKMBINDEX, TROPONINI in the last 168 hours. ? ?BNP: ?Invalid input(s): POCBNP ? ?CBG: ?Recent Labs  ?Lab 05/23/21 ?1152 05/23/21 ?1767204/25/23 ?2054 05/24/21 ?0094704/26/23 ?1145  ?GLUCAP 202* 115* 144* 201* 220*  ? ? ? ?Microbiology: ?Results for orders placed or performed during the hospital encounter of 03/07/21  ?Resp Panel by RT-PCR (Flu A&B, Covid) Nasopharyngeal Swab     Status: None  ? Collection Time: 03/07/21 11:21 PM  ? Specimen: Nasopharyngeal Swab; Nasopharyngeal(NP) swabs in vial transport medium  ?Result Value Ref Range Status  ? SARS Coronavirus 2 by RT PCR NEGATIVE NEGATIVE Final  ?  Comment: (NOTE) ?SARS-CoV-2 target nucleic acids are NOT DETECTED. ? ?The SARS-CoV-2 RNA is generally detectable in upper respiratory ?specimens during the acute phase of infection. The lowest ?concentration of SARS-CoV-2 viral copies this assay can detect is ?138 copies/mL. A negative result does not preclude SARS-Cov-2 ?infection and should not be used as the sole basis for treatment or ?other patient management decisions. A negative result may occur with   ?improper specimen collection/handling, submission of specimen other ?than nasopharyngeal swab, presence of viral mutation(s) within the ?areas targeted by this assay, and inadequate number of viral ?copies(<138 copies/mL). A negative result must be combined with ?clinical observations, patient history, and epidemiological ?information. The expected result is Negative. ? ?Fact Sheet for Patients:  ?EntrepreneurPulse.com.au ? ?Fact Sheet for Healthcare Providers:  ?IncredibleEmployment.be ? ?This test is no t yet approved or cleared by the [place] FDA and  ?has been authorized for detection and/or diagnosis of SARS-CoV-2 by ?FDA under an Emergency Use Authorization (EUA). This EUA will remain  ?in effect (meaning this test can be used) for the duration of the ?COVID-19 declaration under Section 564(b)(1) of the Act, 21 ?U.S.C.section 360bbb-3(b)(1), unless the authorization is terminated  ?or revoked sooner.  ? ? ?  ? Influenza A by PCR NEGATIVE NEGATIVE Final  ? Influenza B by PCR NEGATIVE NEGATIVE Final  ?  Comment: (NOTE) ?The Xpert Xpress SARS-CoV-2/FLU/RSV plus assay is intended as an aid ?in the diagnosis of influenza from Nasopharyngeal swab specimens and ?should not be used as a sole basis for treatment. Nasal washings and ?aspirates are unacceptable for Xpert Xpress SARS-CoV-2/FLU/RSV ?testing. ? ?Fact Sheet for Patients: ?EntrepreneurPulse.com.au ? ?Fact Sheet for Healthcare Providers: ?IncredibleEmployment.be ? ?This test is not yet approved or cleared by the [place] FDA and ?has been authorized for detection and/or diagnosis of SARS-CoV-2 by ?FDA under an Emergency Use Authorization (EUA). This EUA will remain ?in effect (meaning this test can be used) for the duration of the ?COVID-19 declaration under Section 564(b)(1) of the Act, 21 U.S.C. ?section 360bbb-3(b)(1), unless the authorization is terminated  or ?revoked. ? ?Performed at [facility], [place], ?[place] [address] ?  ?Culture, blood (routine x 2)     Status: None  ? Collection Time: 03/08/21  1:07 AM  ? Specimen: BLOOD  ?Result Value Ref Range Status  ? Specimen Description BLOOD RIGHT HAND  Final  ? Special Requests   Final  ?  BOTTLES DRAWN AEROBIC AND ANAEROBIC Blood Culture results may not be optimal due to an inadequate volume of blood received in culture bottles  ? Culture   Final  ?  NO GROWTH 5 DAYS ?Performed at [facility], [address], [place], [place] [address] ?  ? Report Status 03/13/2021 FINAL  Final  ?Urine Culture     Status: Abnormal  ? Collection Time: 03/08/21  1:17 AM  ? Specimen: Urine, Clean Catch  ?Result Value Ref Range Status  ? Specimen Description   Final  ?  URINE, CLEAN CATCH ?Performed at [facility], [address] [place], [place] [address] ?  ? Special Requests   Final  ?  NONE ?Performed at [facility], [address] [place], [place] [address] ?  ? Culture (A)  Final  ?  20,000 COLONIES/mL STREPTOCOCCUS AGALACTIAE ?TESTING AGAINST S. AGALACTIAE NOT ROUTINELY PERFORMED DUE TO PREDICTABILITY OF AMP/PEN/VAN SUSCEPTIBILITY. ?Performed at [facility], [place] [address], [place], [place] [address] ?  ? Report Status  03/09/2021 FINAL  Final  ?Culture, blood (routine x 2)     Status: None  ? Collection Time: 03/08/21  2:02 AM  ? Specimen: BLOOD  ?Result Value Ref Range Status  ? Specimen Description BLOOD RIGHT HAND  Final  ? Special Requests   Final  ?  BOTTLES DRAWN AEROBIC AND ANAEROBIC Blood Culture adequate volume  ? Culture   Final  ?  NO GROWTH 5 DAYS ?Performed at [facility], [address], [place], [place] [address] ?  ? Report Status 03/13/2021 FINAL  Final  ? ? ?Coagulation Studies: ?No results for input(s): LABPROT, INR in the last 72 hours. ? ?Urinalysis: ?No results for input(s): COLORURINE, LABSPEC, [place], GLUCOSEU, HGBUR,  BILIRUBINUR, KETONESUR, PROTEINUR, UROBILINOGEN, NITRITE, LEUKOCYTESUR in the last 72 hours. ? ?Invalid input(s): APPERANCEUR  ? ? ?Imaging: ?PERIPHERAL VASCULAR CATHETERIZATION ? ?Result Date: 05/23/2021 ?See surgical note for result.

## 2021-05-24 NOTE — Progress Notes (Signed)
?  Progress Note ? ? ?PatientMarland Kitchen Samnang Riley [facility] TFT:[id number] DOB: 04/09/1940 DOA: 05/13/2021     12 ?DOS: the patient was seen and examined on 05/25/2021 ?  ?Brief hospital course: ?No notes on file ? ?Assessment and Plan: ?* Acute systolic CHF (congestive heart failure) (HCC) ?--current Echo showed worsening LVEF to 30% ?--received IV Lasix with worsening renal function, now started on dialysis ?--cardio consulted, now signed off ?Plan: ?Continue carvedilol '25mg'$  ?--dialysis for fluid removal ?--outpatient cardio f/u 1-2 weeks after discharge ? ?Acute renal failure superimposed on stage 3b chronic kidney disease ([place]) ?Likely related to ongoing diuretic therapy ?--started on temp dialysis ?Plan: ?--HD per nephro ? ?Chronic anticoagulation ?Continue Eliquis ? ?Atrial fibrillation ([place]) ?Continue Eliquis and carvedilol ?--complete 10 gm amiodarone load and then transition to 200 mg daily. ?--outpatient cardio f/u 1-2 weeks after discharge ? ? ? ?Chronic anemia ?At baseline ? ?Controlled type 2 diabetes mellitus without complication, without long-term current use of insulin ([place]) ?Sliding scale insulin coverage ? ?H/O paroxysmal supraventricular tachycardia s/p ablation ?Continuous cardiac monitoring with no acute issues suspected ? ?CAD (coronary artery disease) ?Appears stable.  Patient without complaints of chest pain and first troponin 20 ?Continue rosuvastatin and carvedilol ? ? ? ? ?  ? ?Subjective:  ?Pt reported still significant abdominal and BLE swelling, though improved.  Still making urine. ?Complained of insomnia.   ? ?Physical Exam: ? ?Constitutional: NAD, AAOx3 ?HEENT: conjunctivae and lids normal, EOMI ?CV: No cyanosis.   ?RESP: normal respiratory effort, on RA ?Extremities: edema in BLE ?SKIN: warm, dry ?Neuro: II - XII grossly intact.   ?Psych: Normal mood and affect.  Appropriate judgement and reason ? ? ?Data Reviewed: ? ?Family Communication:  ? ?Disposition: ?Status is: Inpatient ? ? Planned Discharge  Destination: Home ? ? ? ?Time spent: 50 minutes ? ?Author: ?Enzo Bi, MD ?05/25/2021 2:58 AM ? ?For on call review [email or web address]  ?

## 2021-05-25 DIAGNOSIS — Z992 Dependence on renal dialysis: Secondary | ICD-10-CM

## 2021-05-25 DIAGNOSIS — N186 End stage renal disease: Secondary | ICD-10-CM | POA: Diagnosis not present

## 2021-05-25 DIAGNOSIS — I5021 Acute systolic (congestive) heart failure: Secondary | ICD-10-CM | POA: Diagnosis not present

## 2021-05-25 LAB — CBC
HCT: 32.7 % — ABNORMAL LOW (ref 39.0–52.0)
Hemoglobin: 10.1 g/dL — ABNORMAL LOW (ref 13.0–17.0)
MCH: 27.7 pg (ref 26.0–34.0)
MCHC: 30.9 g/dL (ref 30.0–36.0)
MCV: 89.8 fL (ref 80.0–100.0)
Platelets: 145 10*3/uL — ABNORMAL LOW (ref 150–400)
RBC: 3.64 MIL/uL — ABNORMAL LOW (ref 4.22–5.81)
RDW: 17.9 % — ABNORMAL HIGH (ref 11.5–15.5)
WBC: 5.3 10*3/uL (ref 4.0–10.5)
nRBC: 0 % (ref 0.0–0.2)

## 2021-05-25 LAB — GLUCOSE, CAPILLARY
Glucose-Capillary: 117 mg/dL — ABNORMAL HIGH (ref 70–99)
Glucose-Capillary: 128 mg/dL — ABNORMAL HIGH (ref 70–99)
Glucose-Capillary: 152 mg/dL — ABNORMAL HIGH (ref 70–99)

## 2021-05-25 LAB — BASIC METABOLIC PANEL
Anion gap: 8 (ref 5–15)
BUN: 46 mg/dL — ABNORMAL HIGH (ref 8–23)
CO2: 29 mmol/L (ref 22–32)
Calcium: 8.1 mg/dL — ABNORMAL LOW (ref 8.9–10.3)
Chloride: 101 mmol/L (ref 98–111)
Creatinine, Ser: 2.17 mg/dL — ABNORMAL HIGH (ref 0.61–1.24)
GFR, Estimated: 30 mL/min — ABNORMAL LOW (ref >60)
Glucose, Bld: 127 mg/dL — ABNORMAL HIGH (ref 70–99)
Potassium: 3.7 mmol/L (ref 3.5–5.1)
Sodium: 138 mmol/L (ref 135–145)

## 2021-05-25 LAB — MAGNESIUM: Magnesium: 2.4 mg/dL (ref 1.7–2.4)

## 2021-05-25 MED ORDER — HEPARIN SODIUM (PORCINE) 1000 UNIT/ML IJ SOLN
INTRAMUSCULAR | Status: AC
  Filled 2021-05-25: qty 10

## 2021-05-25 MED ORDER — AMIODARONE HCL 200 MG PO TABS: 200.0000 mg | ORAL_TABLET | Freq: Every day | ORAL | Status: DC

## 2021-05-25 MED ORDER — BISACODYL 10 MG RE SUPP
10.0000 mg | Freq: Every day | RECTAL | Status: DC | PRN
  Administered 2021-05-25: 10 mg via RECTAL
  Filled 2021-05-25: qty 1

## 2021-05-25 NOTE — Progress Notes (Signed)
?[facility]  ?ROUNDING NOTE  ? ?Subjective:  ? ?Timothy Riley is a 81 year old male with past medical history including CAD, hypertension, A-fib with prior cardioversion on Eliquis, and CKD 3B.  Patient presents to the emergency room with complaints of shortness of breath and increased lower extremity edema.  Patient has been admitted for CHF exacerbation ([place]) [I50.9] ?Hypervolemia, unspecified hypervolemia type [E87.70] ? ?Patient is known to our practice and was followed by Dr. Candiss Norse.  Patient currently states he does not currently follow a nephrologist due to continued decline of renal function.  Patient states that diuresis is managed by cardiologist, Dr. Rockey Situ.  ? ?Update ?Alert and oriented ?Tolerated dialysis well yesterday ?Continues to void, UOP 551m ? ?Objective:  ?Vital signs in last 24 hours:  ?Temp:  [96.7 ?F (35.9 ?C)-98 ?F (36.7 ?C)] 97.9 ?F (36.6 ?C) (04/27 06803 ?Pulse Rate:  [47-63] 57 (04/27 1245) ?Resp:  [12-34] 14 (04/27 1245) ?BP: (121-150)/(58-83) 135/60 (04/27 1245) ?SpO2:  [85 %-100 %] 96 % (04/27 1245) ?Weight:  [85 kg-91.5 kg] 91.5 kg (04/27 1028) ? ?Weight change: -3.15 kg ?Filed Weights  ? 05/24/21 1320 05/24/21 1600 05/25/21 1028  ?Weight: 89.6 kg 85 kg 91.5 kg  ? ? ?Intake/Output: ?I/O last 3 completed shifts: ?In: 240 [P.O.:240] ?Out: 1275 [Urine:775; Other:500] ?  ?Intake/Output this shift: ? Total I/O ?In: -  ?Out: 250 [Urine:250] ? ?Physical Exam: ?General: NAD  ?Head: Normocephalic, atraumatic. Moist oral mucosal membranes  ?Eyes: Anicteric  ?Lungs:  Clear to auscultation, normal effort  ?Heart: Regular rate and rhythm  ?Abdomen:  Soft, nontender, distended  ?Extremities: 3+ peripheral edema. Abdominal wall edema  ?Neurologic: Nonfocal, moving all four extremities  ?Skin: No lesions  ?Access: Left IJ temp cath placed on 05/23/2021 by Dr. SDelana Meyer ? ? ?Basic Metabolic Panel: ?Recent Labs  ?Lab 05/21/21 ?0356 05/22/21 ?0410 05/23/21 ?0455 05/24/21 ?02122 05/25/21 ?0604  ?NA 139 137 138 135 138  ?K 4.6 4.9 4.1 3.7 3.7  ?CL 99 99 99 102 101  ?CO2 '28 30 30 28 29  '$ ?GLUCOSE 225* 168* 158* 228* 127*  ?BUN 81* 90* 88* 66* 46*  ?CREATININE 4.32* 4.37* 3.99* 2.83* 2.17*  ?CALCIUM 8.4* 8.4* 8.5* 7.9* 8.1*  ?MG  --   --   --   --  2.4  ? ? ? ?Liver Function Tests: ?Recent Labs  ?Lab 05/19/21 ?0332  ?ALBUMIN 3.2*  ? ? ?No results for input(s): LIPASE, AMYLASE in the last 168 hours. ?No results for input(s): AMMONIA in the last 168 hours. ? ?CBC: ?Recent Labs  ?Lab 05/21/21 ?0356 05/22/21 ?0410 05/23/21 ?0455 05/24/21 ?0482504/27/23 ?0604  ?WBC 5.4 6.5 5.1 6.1 5.3  ?HGB 10.7* 10.9* 10.9* 10.0* 10.1*  ?HCT 35.1* 35.3* 35.3* 32.6* 32.7*  ?MCV 90.9 91.0 90.7 89.3 89.8  ?PLT 188 192 174 155 145*  ? ? ? ?Cardiac Enzymes: ?No results for input(s): CKTOTAL, CKMB, CKMBINDEX, TROPONINI in the last 168 hours. ? ?BNP: ?Invalid input(s): POCBNP ? ?CBG: ?Recent Labs  ?Lab 05/24/21 ?1634 05/24/21 ?2018 05/24/21 ?2305 05/25/21 ?0407 05/25/21 ?0801  ?GLUCAP 77 119* 129* 117* 128*  ? ? ? ?Microbiology: ?Results for orders placed or performed during the hospital encounter of 03/07/21  ?Resp Panel by RT-PCR (Flu A&B, Covid) Nasopharyngeal Swab     Status: None  ? Collection Time: 03/07/21 11:21 PM  ? Specimen: Nasopharyngeal Swab; Nasopharyngeal(NP) swabs in vial transport medium  ?Result Value Ref Range Status  ? SARS Coronavirus 2 by RT PCR  NEGATIVE NEGATIVE Final  ?  Comment: (NOTE) ?SARS-CoV-2 target nucleic acids are NOT DETECTED. ? ?The SARS-CoV-2 RNA is generally detectable in upper respiratory ?specimens during the acute phase of infection. The lowest ?concentration of SARS-CoV-2 viral copies this assay can detect is ?138 copies/mL. A negative result does not preclude SARS-Cov-2 ?infection and should not be used as the sole basis for treatment or ?other patient management decisions. A negative result may occur with  ?improper specimen collection/handling, submission of specimen other ?than  nasopharyngeal swab, presence of viral mutation(s) within the ?areas targeted by this assay, and inadequate number of viral ?copies(<138 copies/mL). A negative result must be combined with ?clinical observations, patient history, and epidemiological ?information. The expected result is Negative. ? ?Fact Sheet for Patients:  ?EntrepreneurPulse.com.au ? ?Fact Sheet for Healthcare Providers:  ?IncredibleEmployment.be ? ?This test is no t yet approved or cleared by the [place] FDA and  ?has been authorized for detection and/or diagnosis of SARS-CoV-2 by ?FDA under an Emergency Use Authorization (EUA). This EUA will remain  ?in effect (meaning this test can be used) for the duration of the ?COVID-19 declaration under Section 564(b)(1) of the Act, 21 ?U.S.C.section 360bbb-3(b)(1), unless the authorization is terminated  ?or revoked sooner.  ? ? ?  ? Influenza A by PCR NEGATIVE NEGATIVE Final  ? Influenza B by PCR NEGATIVE NEGATIVE Final  ?  Comment: (NOTE) ?The Xpert Xpress SARS-CoV-2/FLU/RSV plus assay is intended as an aid ?in the diagnosis of influenza from Nasopharyngeal swab specimens and ?should not be used as a sole basis for treatment. Nasal washings and ?aspirates are unacceptable for Xpert Xpress SARS-CoV-2/FLU/RSV ?testing. ? ?Fact Sheet for Patients: ?EntrepreneurPulse.com.au ? ?Fact Sheet for Healthcare Providers: ?IncredibleEmployment.be ? ?This test is not yet approved or cleared by the [place] FDA and ?has been authorized for detection and/or diagnosis of SARS-CoV-2 by ?FDA under an Emergency Use Authorization (EUA). This EUA will remain ?in effect (meaning this test can be used) for the duration of the ?COVID-19 declaration under Section 564(b)(1) of the Act, 21 U.S.C. ?section 360bbb-3(b)(1), unless the authorization is terminated or ?revoked. ? ?Performed at [facility], [place], ?[place]  [address] ?  ?Culture, blood (routine x 2)     Status: None  ? Collection Time: 03/08/21  1:07 AM  ? Specimen: BLOOD  ?Result Value Ref Range Status  ? Specimen Description BLOOD RIGHT HAND  Final  ? Special Requests   Final  ?  BOTTLES DRAWN AEROBIC AND ANAEROBIC Blood Culture results may not be optimal due to an inadequate volume of blood received in culture bottles  ? Culture   Final  ?  NO GROWTH 5 DAYS ?Performed at [facility], [address] [place], [place] [address] ?  ? Report Status 03/13/2021 FINAL  Final  ?Urine Culture     Status: Abnormal  ? Collection Time: 03/08/21  1:17 AM  ? Specimen: Urine, Clean Catch  ?Result Value Ref Range Status  ? Specimen Description   Final  ?  URINE, CLEAN CATCH ?Performed at [facility], [address] [place], [place] [address] ?  ? Special Requests   Final  ?  NONE ?Performed at [facility], [address] [place], [place] [address] ?  ? Culture (A)  Final  ?  20,000 COLONIES/mL STREPTOCOCCUS AGALACTIAE ?TESTING AGAINST S. AGALACTIAE NOT ROUTINELY PERFORMED DUE TO PREDICTABILITY OF AMP/PEN/VAN SUSCEPTIBILITY. ?Performed at [facility], [place] [address], [place], [place]  [address] ?  ? Report Status 03/09/2021 FINAL  Final  ?Culture, blood (routine x 2)     Status: None  ? Collection Time: 03/08/21  2:02 AM  ? Specimen: BLOOD  ?Result Value Ref Range Status  ? Specimen Description BLOOD RIGHT HAND  Final  ? Special Requests   Final  ?  BOTTLES DRAWN AEROBIC AND ANAEROBIC Blood Culture adequate volume  ? Culture   Final  ?  NO GROWTH 5 DAYS ?Performed at [facility], [address], [place], [place] [address] ?  ? Report Status 03/13/2021 FINAL  Final  ? ? ?Coagulation Studies: ?No results for input(s): LABPROT, INR in the last 72 hours. ? ?Urinalysis: ?No results for input(s): COLORURINE, LABSPEC, [place], GLUCOSEU, HGBUR, BILIRUBINUR, KETONESUR, PROTEINUR, UROBILINOGEN, NITRITE, LEUKOCYTESUR in the last 72  hours. ? ?Invalid input(s): APPERANCEUR  ? ? ?Imaging: ?PERIPHERAL VASCULAR CATHETERIZATION ? ?Result Date: 05/23/2021 ?See surgical note for result. ? ?DG Chest Port 1 View ? ?Result Date: 05/23/2021 ?CLINICAL DATA:  Dialysis ca

## 2021-05-25 NOTE — Progress Notes (Signed)
?   05/23/21 1555 05/23/21 1600 05/23/21 1615  ?Vitals  ?BP  --  135/71 137/70  ?MAP (mmHg)  --  90 91  ?Pulse Rate  --  (!) 57 (!) 57  ?ECG Heart Rate  --  (!) 57 (!) 57  ?Resp  --  15 17  ?During Hemodialysis Assessment  ?Blood Flow Rate (mL/min) 200 mL/min  --  200 mL/min  ?Arterial Pressure (mmHg) -70 mmHg  --  -70 mmHg  ?Venous Pressure (mmHg) 70 mmHg  --  70 mmHg  ?Transmembrane Pressure (mmHg) 30 mmHg  --  30 mmHg  ?Ultrafiltration Rate (mL/min) 230 mL/min  --  230 mL/min  ?Dialysate Flow Rate (mL/min) 300 ml/min  --  300 ml/min  ?Conductivity: Machine  13.7  --  13.7  ?HD Safety Checks Performed Yes  --  Yes  ?Dialysis Fluid Bolus Normal Saline  --   --   ?Bolus Amount (mL) 250 mL  --   --   ?Intra-Hemodialysis Comments Tx initiated  --  Progressing as prescribed  ? ? 05/23/21 1630 05/23/21 1645 05/23/21 1700  ?Vitals  ?BP 134/83 (!) 143/77 133/76  ?MAP (mmHg) 97 93 94  ?Pulse Rate (!) 57 (!) 58 (!) 56  ?ECG Heart Rate (!) 57 (!) 58 (!) 57  ?Resp '17 19 20  '$ ?During Hemodialysis Assessment  ?Blood Flow Rate (mL/min) 200 mL/min 200 mL/min 200 mL/min  ?Arterial Pressure (mmHg) -70 mmHg -70 mmHg -70 mmHg  ?Venous Pressure (mmHg) 70 mmHg 70 mmHg 70 mmHg  ?Transmembrane Pressure (mmHg) 30 mmHg 30 mmHg 30 mmHg  ?Ultrafiltration Rate (mL/min) 230 mL/min 230 mL/min 230 mL/min  ?Dialysate Flow Rate (mL/min) 300 ml/min 300 ml/min 300 ml/min  ?Conductivity: Machine  13.7 13.7 13.7  ?HD Safety Checks Performed Yes Yes Yes  ?Dialysis Fluid Bolus  --   --   --   ?Bolus Amount (mL)  --   --   --   ?Intra-Hemodialysis Comments Progressing as prescribed Progressing as prescribed Progressing as prescribed  ? ?

## 2021-05-25 NOTE — Progress Notes (Signed)
Mobility Specialist - Progress Note ? ? 05/25/21 1300  ?Mobility  ?Activity Off unit  ? ? ? ?Pt off unit for HD tx. Will attempt another date/time.  ? ?Kathee Delton ?Mobility Specialist ?05/25/21, 1:47 PM ? ? ? ? ?

## 2021-05-25 NOTE — Progress Notes (Signed)
PT Cancellation Note ? ?Patient Details ?Name: Timothy Riley [facility] ?MRN: [id number] ?DOB: 02-16-1940 ? ? ?Cancelled Treatment:    Reason Eval/Treat Not Completed: Patient at procedure or test/unavailable. Will try later as time allows or tomorrow.  ? ? ?[name] ?05/25/2021, 12:10 PM ?

## 2021-05-25 NOTE — Progress Notes (Signed)
?  Progress Note ? ? ?PatientMarland Kitchen Timothy Riley [facility] LYY:[id number] DOB: 1940-08-13 DOA: 05/13/2021     12 ?DOS: the patient was seen and examined on 05/25/2021 ?  ?Brief hospital course: ?No notes on file ? ?Assessment and Plan: ?* Acute systolic CHF (congestive heart failure) (HCC) ?--current Echo showed worsening LVEF to 30% ?--received IV Lasix with worsening renal function, now started on dialysis ?--cardio consulted, now signed off ?Plan: ?Continue carvedilol '25mg'$  ?--dialysis for fluid removal ?--outpatient cardio f/u 1-2 weeks after discharge ? ?Acute renal failure superimposed on stage 3b chronic kidney disease ([place]) ?Likely related to ongoing diuretic therapy ?--started on dialysis ?Plan: ?--HD per nephro ? ?Chronic anticoagulation ?Hold Eliquis for PermCath placement ? ?Atrial fibrillation ([place]) ?--cont carvedilol ?--complete 10 gm amiodarone load and then transition to 200 mg daily. ?--hold Eliquis for upcoming PermCath placement ?--outpatient cardio f/u 1-2 weeks after discharge ? ? ? ?ESRD (end stage renal disease) ([place]) ?Started on dialysis ?Plan: ?--PermCath placement tomorrow ?--GenSurg consult for PD placement ?--Cardiology clearance for OR ?--start setting up for outpatient dialysis ? ?Chronic anemia ?At baseline ? ?Controlled type 2 diabetes mellitus without complication, without long-term current use of insulin ([place]) ?BG within goal ?--d/c BG checks and SSI ? ?H/O paroxysmal supraventricular tachycardia s/p ablation ?Continuous cardiac monitoring with no acute issues suspected ? ?CAD (coronary artery disease) ?Appears stable.  Patient without complaints of chest pain and first troponin 20 ?Continue rosuvastatin and carvedilol ? ? ? ? ?  ? ?Subjective:  ?Pt had HD today.   ?Started process to set up for outpatient dialysis. ? ?Physical Exam: ? ?Constitutional: NAD, sleepy but arousable ?HEENT: conjunctivae and lids normal, EOMI ?CV: No cyanosis.   ?RESP: normal respiratory effort, on RA ?SKIN: warm,  dry ? ? ?Data Reviewed: ? ?Family Communication:  ? ?Disposition: ?Status is: Inpatient ? ? Planned Discharge Destination: Home ? ? ? ?Time spent: 50 minutes ? ?Author: ?Enzo Bi, MD ?05/25/2021 7:36 PM ? ?For on call review [email or web address]  ?

## 2021-05-25 NOTE — Progress Notes (Signed)
?   05/24/21 1323 05/24/21 1330 05/24/21 1345  ?Vitals  ?BP  --  133/67 131/67  ?MAP (mmHg)  --  88 87  ?Pulse Rate  --  (!) 52 (!) 50  ?ECG Heart Rate  --  (!) 48 (!) 50  ?Resp  --  13 14  ?During Hemodialysis Assessment  ?Blood Flow Rate (mL/min) 250 mL/min 250 mL/min 250 mL/min  ?Arterial Pressure (mmHg) -80 mmHg -80 mmHg -80 mmHg  ?Venous Pressure (mmHg) 100 mmHg 110 mmHg 110 mmHg  ?Transmembrane Pressure (mmHg) 30 mmHg 30 mmHg 30 mmHg  ?Ultrafiltration Rate (mL/min) 400 mL/min 400 mL/min 400 mL/min  ?Dialysate Flow Rate (mL/min) 300 ml/min 300 ml/min 300 ml/min  ?Conductivity: Machine  13.9 13.9 13.9  ?HD Safety Checks Performed Yes Yes Yes  ?Dialysis Fluid Bolus Normal Saline  --   --   ?Bolus Amount (mL) 250 mL  --   --   ?Intra-Hemodialysis Comments Tx initiated Progressing as prescribed Progressing as prescribed  ? ? 05/24/21 1400 05/24/21 1415 05/24/21 1430  ?Vitals  ?BP (!) 143/58 129/65 126/67  ?MAP (mmHg) 82 85 86  ?Pulse Rate (!) 51 (!) 51 (!) 49  ?ECG Heart Rate (!) 50 (!) 45 (!) 49  ?Resp '13 12 13  '$ ?During Hemodialysis Assessment  ?Blood Flow Rate (mL/min) 250 mL/min 250 mL/min 250 mL/min  ?Arterial Pressure (mmHg) -80 mmHg -80 mmHg -80 mmHg  ?Venous Pressure (mmHg) 110 mmHg 110 mmHg 110 mmHg  ?Transmembrane Pressure (mmHg) 30 mmHg 30 mmHg 30 mmHg  ?Ultrafiltration Rate (mL/min) 400 mL/min 400 mL/min 400 mL/min  ?Dialysate Flow Rate (mL/min) 300 ml/min 300 ml/min 300 ml/min  ?Conductivity: Machine  13.9 13.9 13.9  ?HD Safety Checks Performed Yes Yes Yes  ?Dialysis Fluid Bolus  --   --   --   ?Bolus Amount (mL)  --   --   --   ?Intra-Hemodialysis Comments Progressing as prescribed Progressing as prescribed Progressing as prescribed  ? ?

## 2021-05-25 NOTE — Progress Notes (Signed)
?   05/23/21 1715 05/23/21 1730 05/23/21 1745  ?Vitals  ?BP (!) 141/77 (!) 146/73 (!) 128/101  ?MAP (mmHg) 95 93 111  ?Pulse Rate (!) 55 (!) 55 (!) 57  ?ECG Heart Rate (!) 56 (!) 57 (!) 58  ?Resp 14 15 (!) 21  ?During Hemodialysis Assessment  ?Blood Flow Rate (mL/min) 200 mL/min 200 mL/min 200 mL/min  ?Arterial Pressure (mmHg) -70 mmHg -70 mmHg -70 mmHg  ?Venous Pressure (mmHg) 70 mmHg 70 mmHg 70 mmHg  ?Transmembrane Pressure (mmHg) 30 mmHg 30 mmHg 30 mmHg  ?Ultrafiltration Rate (mL/min) 230 mL/min 230 mL/min 230 mL/min  ?Dialysate Flow Rate (mL/min) 300 ml/min 300 ml/min 300 ml/min  ?Conductivity: Machine  13.7 13.7 13.7  ?HD Safety Checks Performed Yes Yes Yes  ?Intra-Hemodialysis Comments Progressing as prescribed Progressing as prescribed Progressing as prescribed  ? ? 05/23/21 1754  ?Vitals  ?BP  --   ?MAP (mmHg)  --   ?Pulse Rate  --   ?ECG Heart Rate  --   ?Resp  --   ?During Hemodialysis Assessment  ?Blood Flow Rate (mL/min)  --   ?Arterial Pressure (mmHg)  --   ?Venous Pressure (mmHg)  --   ?Transmembrane Pressure (mmHg)  --   ?Ultrafiltration Rate (mL/min)  --   ?Dialysate Flow Rate (mL/min)  --   ?Conductivity: Machine   --   ?HD Safety Checks Performed  --   ?Intra-Hemodialysis Comments Tx completed  ? ? ?

## 2021-05-25 NOTE — Assessment & Plan Note (Addendum)
Started on dialysis ?--PermCath placement on 4/28.  Not a good candidate for PD. ?--already set up for outpatient dialysis ?

## 2021-05-25 NOTE — Consult Note (Signed)
?Date of Consultation:  05/25/2021 ? ?Requesting Physician:  Enzo Bi, MD ? ?Reason for Consultation:  Worsening kidney disease, requiring dialysis ? ?History of Present Illness: ?Timothy Riley is a 81 y.o. male admitted on 05/13/21 with shortness of breath, abdominal distention, and lower extremity edema.  He has a history of atrial fibrillation s/p prior ablation, currently on Eliquis, aortic stenosis s/p AVR, DM, CAD, HTN, CKD 3b.  His diuretic regimen has been increased recently to help with his edema but this has also worsened his renal failure.  On admission, he was in atrial fibrillation and echo showed an EF of 30-35%.  He underwent cardioversion on 05/17/21 and has remained in sinus rhythm.  He had a temporary dialysis catheter placed on 05/23/21 and has been undergoing dialysis in the hospital.  He's undergoing Permcath possibly tomorrow. ? ?The patient reports to me today that he is disillusioned about the permanency of dialysis.  I think he thought he would get better really quick with temporary dialysis and would not need any long term dialysis.  However, he has had discussions with his nephrology team about long term dialysis and he has opted for peritoneal dialysis that he can do at home rather than having to go to a dialysis center three times a week.  Currently, he's still feeling weak from all this process. ? ?He had a robotic assisted right inguinal hernia repair with Dr. Christian Mate on 01/08/2019.  On CT scan from 03/07/2021, there appears to be a small umbilical hernia, but no inguinal hernia.  Has not had other abdominal surgeries. ? ?Past Medical History: ?Past Medical History:  ?Diagnosis Date  ? 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 Aortic heart valve narrowing 12/31/2013  ? Overview:  Sp #23 pericardial edwards valve repalcement 2016   ? Aortic stenosis   ? a. 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 Glower MD @ [facility] via R thoracic approach; b. 03/2018 Echo: EF 60-65%,  DD. Mildly dil LA. Nl fxn AoV prosthesis. Grad 79mHg.  ? Basal cell carcinoma 05/15/2017  ? right nose above alar crease  ? CKD (chronic kidney disease) stage 3, GFR 30-59 ml/min (HCC) 06/17/2014  ? Diabetes mellitus without complication ([place]   ? Hypertension   ? 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 Low serum HDL 10/26/2015  ? Non-obstructive CAD (coronary artery disease)   ? a. 12/2013 Cath ([facility]): LAD 30p, RI 60, otw nl (per CT surgery note [facility]; b. 03/2018 MV: EF 59%, no ischemia/scar.  ? Persistent atrial fibrillation ([place] 10/2017  ? a. CHA2DS2VASc = 5-->Eliquis; b. 12/2017 loaded w/amio-->DCCV.  ? PSVT (paroxysmal supraventricular tachycardia) ([facility]   ? a. 05/2014 s/p RFCA @ [facility].  ? Right inguinal hernia   ? Urinary incontinence   ?  ? ?Past Surgical History: ?Past Surgical History:  ?Procedure Laterality Date  ? AORTIC VALVE REPLACEMENT    ? CARDIAC CATHETERIZATION    ? CARDIOVERSION N/A 01/01/2018  ? Procedure: CARDIOVERSION;  Surgeon: GMinna Merritts MD;  Location: [facility] ORS;  Service: Cardiovascular;  Laterality: N/A;  ? CARDIOVERSION N/A 05/16/2021  ? Procedure: CARDIOVERSION;  Surgeon: ENelva Bush MD;  Location: [facility] ORS;  Service: Cardiovascular;  Laterality: N/A;  ? CARDIOVERSION N/A 05/17/2021  ? Procedure: CARDIOVERSION;  Surgeon: AKate Sable MD;  Location: [facility] ORS;  Service: Cardiovascular;  Laterality: N/A;  ? COLONOSCOPY  2010  ? heart valve replacment  2015  ? TEMPORARY DIALYSIS  CATHETER N/A 05/23/2021  ? Procedure: TEMPORARY DIALYSIS CATHETER;  Surgeon: Katha Cabal, MD;  Location: [facility];  Service: Cardiovascular;  Laterality: N/A;  ? XI ROBOTIC ASSISTED INGUINAL HERNIA REPAIR WITH MESH Right 01/07/2019  ? Procedure: XI ROBOTIC ASSISTED INGUINAL HERNIA REPAIR WITH MESH;  Surgeon: Ronny Bacon, MD;  Location: [facility] ORS;  Service: General;  Laterality: Right;  ? ? ?Home Medications: ?Prior to Admission  medications   ?Medication Sig Start Date End Date Taking? Authorizing Provider  ?carvedilol (COREG) 12.5 MG tablet Take 1 tablet (12.5 mg total) by mouth 2 (two) times daily with a meal. 03/15/21  Yes Jennye Boroughs, MD  ?Cholecalciferol (VITAMIN D3 PO) Take by mouth daily.   Yes [provider]  ?doxazosin (CARDURA) 8 MG tablet TAKE 1 TABLET BY MOUTH TWICE A DAY 05/03/21  Yes Gollan, Kathlene November, MD  ?ELIQUIS 2.5 MG TABS tablet TAKE 1 TABLET BY MOUTH TWICE A DAY 04/19/21  Yes Gollan, Kathlene November, MD  ?glipiZIDE (GLUCOTROL) 10 MG tablet TAKE 1 TABLET BY MOUTH TWICE A DAY BEFORE A MEAL. ?Patient taking differently: Take 10 mg by mouth daily before breakfast. 01/28/20  Yes Lebron Conners D, MD  ?pioglitazone (ACTOS) 30 MG tablet TAKE 1 TABLET BY MOUTH ONCE DAILY 01/20/20  Yes Towanda Malkin, MD  ?potassium chloride SA (KLOR-CON M) 20 MEQ tablet Take 1 tablet (20 mEq total) by mouth 2 (two) times daily. 04/25/21  Yes Minna Merritts, MD  ?rosuvastatin (CRESTOR) 10 MG tablet Take 1 tablet (10 mg total) by mouth daily. 06/29/20  Yes Sowles, Drue Stager, MD  ?torsemide (DEMADEX) 20 MG tablet Take 2 tablets (40 mg total) by mouth 2 (two) times daily. 04/25/21 07/24/21 Yes Gollan, Kathlene November, MD  ?amoxicillin (AMOXIL) 500 MG capsule TAKE 4 CAPSULES 1 HOUR BEFORE DENTAL APPOINTMENT ?Patient not taking: Reported on 04/25/2021 04/28/20   Minna Merritts, MD  ? ? ?Allergies: ?Allergies  ?Allergen Reactions  ? Gabapentin Other (See Comments) and Rash  ?  dizzy ?dizzy  ? Hydralazine Anxiety and Rash  ? Hydralazine Hcl Rash  ? ? ?Social History: ? reports that he quit smoking about 49 years ago. His smoking use included cigarettes. He has a 0.50 pack-year smoking history. He has never used smokeless tobacco. He reports that he does not drink alcohol and does not use drugs.  ? ?Family History: ?Family History  ?Problem Relation Age of Onset  ? Cancer Mother   ? Stroke Father   ? ? ?Review of Systems: ?Review of Systems   ?Constitutional:  Positive for malaise/fatigue. Negative for chills, fever and weight loss.  ?HENT:  Negative for hearing loss.   ?Respiratory:  Positive for shortness of breath.   ?Cardiovascular:  Negative for chest pain.  ?Gastrointestinal:  Negative for abdominal pain, constipation, diarrhea, nausea and vomiting.  ?Genitourinary:  Negative for dysuria.  ?Musculoskeletal:  Negative for myalgias.  ?Skin:  Negative for rash.  ?Neurological:  Negative for dizziness.  ?Psychiatric/Behavioral:  Negative for depression.   ? ?Physical Exam ?BP (!) 141/83   Pulse (!) 59   Temp 98.2 ?F (36.8 ?C)   Resp 18   Ht '5\' 8"'$  (1.727 m)   Wt 90 kg   SpO2 98%   BMI 30.17 kg/m?  ?CONSTITUTIONAL: No acute distress. ?HEENT:  Normocephalic, atraumatic, extraocular motion intact. ?NECK: Trachea is midline, and there is no jugular venous distension. ?RESPIRATORY:  Normal respiratory effort without pathologic use of accessory muscles. ?CARDIOVASCULAR: Regular rhythm and rate ?GI:  The abdomen is soft, non-distended, non-tender.  The patient has a small 1 cm umbilical hernia, easily reducible.  No evidence of inguinal hernia recurrence.   ?MUSCULOSKELETAL:  Has pitting edema of bilateral lower extremities. ?SKIN: Skin turgor is normal. There are no pathologic skin lesions.  ?NEUROLOGIC:  Motor and sensation is grossly normal.  Cranial nerves are grossly intact. ?PSYCH:  Alert and oriented to person, place and time. Affect is normal. ? ?Laboratory Analysis: ?Results for orders placed or performed during the hospital encounter of 05/13/21 (from the past 24 hour(s))  ?Glucose, capillary     Status: None  ? Collection Time: 05/24/21  4:34 PM  ?Result Value Ref Range  ? Glucose-Capillary 77 70 - 99 mg/dL  ?Glucose, capillary     Status: Abnormal  ? Collection Time: 05/24/21  8:18 PM  ?Result Value Ref Range  ? Glucose-Capillary 119 (H) 70 - 99 mg/dL  ?Glucose, capillary     Status: Abnormal  ? Collection Time: 05/24/21 11:05 PM  ?Result  Value Ref Range  ? Glucose-Capillary 129 (H) 70 - 99 mg/dL  ?Glucose, capillary     Status: Abnormal  ? Collection Time: 05/25/21  4:07 AM  ?Result Value Ref Range  ? Glucose-Capillary 117 (H) 70 - 99 mg/dL  ?Basic metab

## 2021-05-25 NOTE — Progress Notes (Signed)
Hemodialysis Post Treatment Note ? ?Access: LIJ Catheter  ? ?Scheduled Time: 3 hours ? ?UF Removed: 1.5-liters ? ?Next Scheduled Treatment: 05/27/21 ? ?Note: ? ? Patient completes hemodialysis in stable condition Targeted UF reached with 1.5-liter fluid removed. CVC functions well. Prescribed BFR achieved. Patient confused about permanency of dialysis, on going education needed. VSS. Transported to assigned.  ? ?

## 2021-05-25 NOTE — Progress Notes (Signed)
?   05/24/21 1445 05/24/21 1500 05/24/21 1515  ?Vitals  ?BP 135/65 131/62 (!) 141/62  ?MAP (mmHg) 85 81 82  ?Pulse Rate (!) 50 (!) 47 (!) 49  ?ECG Heart Rate (!) 51 (!) 49 (!) 46  ?Resp '13 13 12  '$ ?During Hemodialysis Assessment  ?Blood Flow Rate (mL/min) 250 mL/min 250 mL/min 250 mL/min  ?Arterial Pressure (mmHg) -80 mmHg -90 mmHg -90 mmHg  ?Venous Pressure (mmHg) 110 mmHg 90 mmHg 100 mmHg  ?Transmembrane Pressure (mmHg) 30 mmHg 30 mmHg 30 mmHg  ?Ultrafiltration Rate (mL/min) 400 mL/min 400 mL/min 400 mL/min  ?Dialysate Flow Rate (mL/min) 300 ml/min 300 ml/min 300 ml/min  ?Conductivity: Machine  13.9 13.8 13.8  ?HD Safety Checks Performed Yes Yes Yes  ?Intra-Hemodialysis Comments Progressing as prescribed Progressing as prescribed Progressing as prescribed  ? ? 05/24/21 1530 05/24/21 1545 05/24/21 1554  ?Vitals  ?BP 134/64 127/73  --   ?MAP (mmHg) 84 90  --   ?Pulse Rate (!) 48 (!) 52  --   ?ECG Heart Rate (!) 50 (!) 52  --   ?Resp 13 14  --   ?During Hemodialysis Assessment  ?Blood Flow Rate (mL/min) 250 mL/min 250 mL/min  --   ?Arterial Pressure (mmHg) -90 mmHg -90 mmHg  --   ?Venous Pressure (mmHg) 100 mmHg 100 mmHg  --   ?Transmembrane Pressure (mmHg) 30 mmHg 30 mmHg  --   ?Ultrafiltration Rate (mL/min) 400 mL/min 400 mL/min  --   ?Dialysate Flow Rate (mL/min) 300 ml/min 300 ml/min  --   ?Conductivity: Machine  13.8 13.8  --   ?HD Safety Checks Performed Yes Yes  --   ?Intra-Hemodialysis Comments Progressing as prescribed Progressing as prescribed Tx completed  ? ?

## 2021-05-25 NOTE — Progress Notes (Deleted)
Hemodialysis Post Treatment Note ? ?Access:  RIJ CVC  ? ?Scheduled Treatment Time:  3.5 Actual  ?Time: 2.0 ? ?UF Removed:  1603 ml ? ?Next Scheduled Tx: TBD ? ?Note:  ? ?Patient presents for 3.5 hr. scheduled treatment, from ICU. Patient has intact CVC without signs of infection. Patient tolerates Tx without incident, fully cooperative. BFR maintained throughout the course of treatment, unachieved UFR due to patient requesting that treatment end after 2 hrs. Nephrology NP spoke with pt. he insisted on ending treatment. Report given to primary, and patient transported back to ICU. No medication given this session.  ?

## 2021-05-26 ENCOUNTER — Encounter
Admission: EM | Disposition: A | Discharge status: Home Health | Payer: Self-pay | Source: Home / Self Care | Attending: Internal Medicine

## 2021-05-26 DIAGNOSIS — I25118 Atherosclerotic heart disease of native coronary artery with other forms of angina pectoris: Secondary | ICD-10-CM | POA: Diagnosis not present

## 2021-05-26 DIAGNOSIS — Z992 Dependence on renal dialysis: Secondary | ICD-10-CM | POA: Diagnosis not present

## 2021-05-26 DIAGNOSIS — I4819 Other persistent atrial fibrillation: Secondary | ICD-10-CM | POA: Diagnosis not present

## 2021-05-26 DIAGNOSIS — I5021 Acute systolic (congestive) heart failure: Secondary | ICD-10-CM | POA: Diagnosis not present

## 2021-05-26 DIAGNOSIS — D649 Anemia, unspecified: Secondary | ICD-10-CM | POA: Diagnosis not present

## 2021-05-26 DIAGNOSIS — N170 Acute kidney failure with tubular necrosis: Secondary | ICD-10-CM | POA: Diagnosis not present

## 2021-05-26 DIAGNOSIS — N186 End stage renal disease: Secondary | ICD-10-CM | POA: Diagnosis not present

## 2021-05-26 HISTORY — PX: DIALYSIS/PERMA CATHETER INSERTION: CATH118288

## 2021-05-26 LAB — BASIC METABOLIC PANEL
Anion gap: 9 (ref 5–15)
BUN: 31 mg/dL — ABNORMAL HIGH (ref 8–23)
CO2: 27 mmol/L (ref 22–32)
Calcium: 8.3 mg/dL — ABNORMAL LOW (ref 8.9–10.3)
Chloride: 101 mmol/L (ref 98–111)
Creatinine, Ser: 1.87 mg/dL — ABNORMAL HIGH (ref 0.61–1.24)
GFR, Estimated: 35 mL/min — ABNORMAL LOW (ref >60)
Glucose, Bld: 151 mg/dL — ABNORMAL HIGH (ref 70–99)
Potassium: 3.9 mmol/L (ref 3.5–5.1)
Sodium: 137 mmol/L (ref 135–145)

## 2021-05-26 LAB — CBC
HCT: 34.7 % — ABNORMAL LOW (ref 39.0–52.0)
Hemoglobin: 10.7 g/dL — ABNORMAL LOW (ref 13.0–17.0)
MCH: 28 pg (ref 26.0–34.0)
MCHC: 30.8 g/dL (ref 30.0–36.0)
MCV: 90.8 fL (ref 80.0–100.0)
Platelets: 149 10*3/uL — ABNORMAL LOW (ref 150–400)
RBC: 3.82 MIL/uL — ABNORMAL LOW (ref 4.22–5.81)
RDW: 17.9 % — ABNORMAL HIGH (ref 11.5–15.5)
WBC: 6.2 10*3/uL (ref 4.0–10.5)
nRBC: 0 % (ref 0.0–0.2)

## 2021-05-26 LAB — GLUCOSE, CAPILLARY
Glucose-Capillary: 152 mg/dL — ABNORMAL HIGH (ref 70–99)
Glucose-Capillary: 143 mg/dL — ABNORMAL HIGH (ref 70–99)
Glucose-Capillary: 146 mg/dL — ABNORMAL HIGH (ref 70–99)

## 2021-05-26 LAB — MAGNESIUM: Magnesium: 2.3 mg/dL (ref 1.7–2.4)

## 2021-05-26 SURGERY — DIALYSIS/PERMA CATHETER INSERTION
Anesthesia: Moderate Sedation

## 2021-05-26 MED ORDER — FUROSEMIDE 40 MG PO TABS
80.0000 mg | ORAL_TABLET | Freq: Every day | ORAL | Status: DC
Start: 2021-05-26 — End: 2021-05-29
  Administered 2021-05-26 – 2021-05-29 (×4): 80 mg via ORAL
  Filled 2021-05-26 (×3): qty 2

## 2021-05-26 MED ORDER — MIDAZOLAM HCL 2 MG/ML PO SYRP
8.0000 mg | ORAL_SOLUTION | Freq: Once | ORAL | Status: DC | PRN
Start: 2021-05-26 — End: 2021-05-26

## 2021-05-26 MED ORDER — QUETIAPINE FUMARATE 25 MG PO TABS
100.0000 mg | ORAL_TABLET | Freq: Every day | ORAL | Status: DC
  Administered 2021-05-26: 100 mg via ORAL
  Filled 2021-05-26: qty 4

## 2021-05-26 MED ORDER — AMIODARONE HCL 200 MG PO TABS
200.0000 mg | ORAL_TABLET | Freq: Two times a day (BID) | ORAL | Status: DC
  Administered 2021-05-28 – 2021-05-29 (×3): 200 mg via ORAL
  Filled 2021-05-26 (×3): qty 1

## 2021-05-26 MED ORDER — DIPHENHYDRAMINE HCL 50 MG/ML IJ SOLN: 50.0000 mg | Freq: Once | INTRAMUSCULAR | Status: DC | PRN

## 2021-05-26 MED ORDER — MIDAZOLAM HCL 2 MG/2ML IJ SOLN
INTRAMUSCULAR | Status: AC
  Filled 2021-05-26: qty 2

## 2021-05-26 MED ORDER — FAMOTIDINE 20 MG PO TABS: 40.0000 mg | ORAL_TABLET | Freq: Once | ORAL | Status: DC | PRN

## 2021-05-26 MED ORDER — ONDANSETRON HCL 4 MG/2ML IJ SOLN
4.0000 mg | Freq: Four times a day (QID) | INTRAMUSCULAR | Status: DC | PRN
Start: 2021-05-26 — End: 2021-05-26

## 2021-05-26 MED ORDER — CEFAZOLIN SODIUM-DEXTROSE 1-4 GM/50ML-% IV SOLN
INTRAVENOUS | Status: AC
  Filled 2021-05-26: qty 50

## 2021-05-26 MED ORDER — FENTANYL CITRATE PF 50 MCG/ML IJ SOSY
PREFILLED_SYRINGE | INTRAMUSCULAR | Status: AC
  Filled 2021-05-26: qty 1

## 2021-05-26 MED ORDER — SODIUM CHLORIDE 0.9 % IV SOLN
INTRAVENOUS | Status: DC
  Administered 2021-05-26: 1000 mL via INTRAVENOUS

## 2021-05-26 MED ORDER — FENTANYL CITRATE (PF) 100 MCG/2ML IJ SOLN
INTRAMUSCULAR | Status: DC | PRN
  Administered 2021-05-26 (×2): 25 ug via INTRAVENOUS

## 2021-05-26 MED ORDER — MIDAZOLAM HCL 2 MG/2ML IJ SOLN
INTRAMUSCULAR | Status: DC | PRN
  Administered 2021-05-26: 2 mg via INTRAVENOUS

## 2021-05-26 MED ORDER — CEFAZOLIN SODIUM-DEXTROSE 1-4 GM/50ML-% IV SOLN
INTRAVENOUS | Status: AC | PRN
  Administered 2021-05-26: 1 g via INTRAVENOUS

## 2021-05-26 MED ORDER — HYDROMORPHONE HCL 1 MG/ML IJ SOLN: 1.0000 mg | Freq: Once | INTRAMUSCULAR | Status: DC | PRN

## 2021-05-26 MED ORDER — CEFAZOLIN SODIUM-DEXTROSE 1-4 GM/50ML-% IV SOLN: 1.0000 g | INTRAVENOUS | Status: AC

## 2021-05-26 MED ORDER — METHYLPREDNISOLONE SODIUM SUCC 125 MG IJ SOLR
125.0000 mg | Freq: Once | INTRAMUSCULAR | Status: DC | PRN
Start: 2021-05-26 — End: 2021-05-26

## 2021-05-26 SURGICAL SUPPLY — 9 items
CANNULA 5F STIFF (CANNULA) ×1 IMPLANT
CATH CANNON HEMO 15FR 19 (HEMODIALYSIS SUPPLIES) ×1 IMPLANT
COVER PROBE U/S 5X48 (MISCELLANEOUS) ×1 IMPLANT
DERMABOND ADVANCED (GAUZE/BANDAGES/DRESSINGS) ×1
DERMABOND ADVANCED .7 DNX12 (GAUZE/BANDAGES/DRESSINGS) IMPLANT
GAUZE SPONGE 4X4 12PLY STRL (GAUZE/BANDAGES/DRESSINGS) ×2 IMPLANT
PACK ANGIOGRAPHY (CUSTOM PROCEDURE TRAY) ×1 IMPLANT
SUT MNCRL AB 4-0 PS2 18 (SUTURE) ×1 IMPLANT
SUT SILK 0 FSL (SUTURE) ×1 IMPLANT

## 2021-05-26 NOTE — Op Note (Signed)
[facility] ? ? ?OPERATIVE NOTE ? ? ? ? ?PROCEDURE: ?1. Insertion of a right IJ tunneled dialysis catheter. ?2. Catheter placement and cannulation under ultrasound and fluoroscopic guidance ? ?PRE-OPERATIVE DIAGNOSIS: end-stage renal requiring hemodialysis ? ?POST-OPERATIVE DIAGNOSIS: same as above ? ?SURGEON: [name] ? ?ANESTHESIA: Conscious sedation was administered under my direct supervision by the interventional radiology RN. IV Versed plus fentanyl were utilized. Continuous ECG, pulse oximetry and blood pressure was monitored throughout the entire procedure.  Conscious sedation was for a total of 21 minutes and 3 seconds. ? ?ESTIMATED BLOOD LOSS: Minimal ? ?FINDING(S): ?1.  Tips of the catheter in the right atrium on fluoroscopy ?2.  No obvious pneumothorax on fluoroscopy ? ?SPECIMEN(S):  none ? ?INDICATIONS:   ?Timothy Riley is a 81 y.o. male  presents with end stage renal disease.  Therefore, the patient requires a tunneled dialysis catheter placement.  The patient is informed of  the risks catheter placement include but are not limited to: bleeding, infection, central venous injury, pneumothorax, possible venous stenosis, possible malpositioning in the venous system, and possible infections related to long-term catheter presence.  The patient was aware of these risks and agreed to proceed. ? ?DESCRIPTION: ?The patient was taken back to Special Procedure suite.  Prior to sedation, the patient was given IV antibiotics.  After obtaining adequate sedation, the patient was prepped and draped in the standard fashion for a right IJ tunneled dialysis catheter placement.  Appropriate Time Out is called.    ? ?The right neck and chest wall are then infiltrated with 1% Lidocaine with epinepherine.  A 19 cm tip to cuff catheter is then selected, opened on the back table and prepped. Ultrasound is placed in a sterile sleeve.  Under ultrasound guidance, the right internal jugular vein is  examined and is noted to be echolucent and easily compressible indicating patency.   An image is recorded for the permanent record.  The right internal jugular vein is cannulated with the microneedle under direct ultrasound vissualization.  A Microwire followed by a micro sheath is then inserted without difficulty.   A J-wire was then advanced under fluoroscopic guidance into the inferior vena cava and the wire was secured.  Small counter incision was then made at the wire insertion site. A small pocket was fashioned with blunt dissection to allow easier passage of the cuff. ? ?The dilator and peel-away sheath are then advanced over the wire under fluoroscopic guidance. The catheters and advanced through the peel-away sheath. It is approximated to the right chest wall after verifying the tips at the atrial caval junction and an exit site is selected. ? ?Small incision is made at the selected exit site and the tunneling device was passed subcutaneously to the counter incision. Catheter is then pulled through the subcutaneous tunnel. The catheter is then verified for tip position under fluoroscopy, transected and the hub assembly connected. ? ?  Each port was tested by aspirating and flushing.  No resistance was noted.  Each port was then thoroughly flushed with heparinized saline.  The catheter was secured in placed with two interrupted stitches of 0 silk tied to the catheter.  The counter incision was closed with a U-stitch of 4-0 Monocryl.  The insertion site is then cleaned and sterile bandages applied including a Biopatch.  Each port was then packed with concentrated heparin (1000 Units/mL) at the manufacturer recommended volumes to each port.  Sterile caps were applied to each port.  On completion  fluoroscopy, the tips of the catheter were in the right atrium, and there was no evidence of pneumothorax. ? ?COMPLICATIONS: None ? ?CONDITION: Unchanged ? ? ?[name] ?[facility] ?Office:  [phone] ? ? ?05/26/2021, 2:46 PM ? ? ?  ?

## 2021-05-26 NOTE — Progress Notes (Signed)
?  Progress Note ? ? ?[facility]:[id number] DOB: 1940/02/08 DOA: 05/13/2021     13 ?DOS: the patient was seen and examined on 05/26/2021 ?  ?Brief hospital course: ?No notes on file ? ?Assessment and Plan: ?* Acute systolic CHF (congestive heart failure) (HCC) ?--current Echo showed worsening LVEF to 30% ?--received IV Lasix with worsening renal function, now started on dialysis ?--cardio consulted, now signed off ?Plan: ?Continue carvedilol '25mg'$  ?--dialysis for fluid removal ?--outpatient cardio f/u 1-2 weeks after discharge ? ?Acute renal failure superimposed on stage 3b chronic kidney disease ([place]) ?Likely related to ongoing diuretic therapy ?--started on dialysis ?Plan: ?--HD per nephro ? ?Chronic anticoagulation ?Hold Eliquis for PermCath placement ? ?Atrial fibrillation ([place]) ?--cont carvedilol ?--complete 10 gm amiodarone load and then transition to 200 mg daily. ?--hold Eliquis for upcoming PermCath placement ?--outpatient cardio f/u 1-2 weeks after discharge ? ? ? ?ESRD (end stage renal disease) ([place]) ?Started on dialysis ?Plan: ?--PermCath placement today ?--setting up for outpatient dialysis ? ?Chronic anemia ?At baseline ? ?Controlled type 2 diabetes mellitus without complication, without long-term current use of insulin ([place]) ?BG within goal ?--d/c BG checks and SSI ? ?H/O paroxysmal supraventricular tachycardia s/p ablation ?Continuous cardiac monitoring with no acute issues suspected ? ?CAD (coronary artery disease) ?Appears stable.  Patient without complaints of chest pain and first troponin 20 ?Continue rosuvastatin and carvedilol ? ? ? ? ?  ? ?Subjective:  ?Had PermCath placement today. ? ?After further discussion between nephro and son, pt was deemed not a good candidate for PD. ? ? ?Physical Exam: ? ?Constitutional: NAD, AAOx3 ?HEENT: conjunctivae and lids normal, EOMI ?CV: No cyanosis.   ?RESP: normal respiratory effort, on RA ?Neuro: II - XII grossly intact.   ?Psych: Normal mood  and affect.   ? ? ?Data Reviewed: ? ?Family Communication: niece updated at the bedside today ? ?Disposition: ?Status is: Inpatient ? ? Planned Discharge Destination: Home ? ? ? ?Time spent: 50 minutes ? ?Author: ?Enzo Bi, MD ?05/26/2021 11:53 PM ? ?For on call review [email or web address].  ?

## 2021-05-26 NOTE — Progress Notes (Signed)
[facility] ?SURGICAL PROGRESS NOTE (cpt [phone]) ? ?Hospital Day(s): 13.  ? ?Post op day(s): 3 Days Post-Op.  ? ?Interval History: Patient seen and examined, no acute events or new complaints overnight. Patient reports most of his discomfort is secondary to HD catheter in his neck. Feels like he can not move. No fever, chills, nausea, emesis, or abdominal pain. He is NPO this morning awaiting perm-cath placement with vascular surgery.  ? ?Review of Systems:  ?Constitutional: denies fever, chills  ?HEENT: denies cough or congestion  ?Respiratory: denies any shortness of breath  ?Cardiovascular: denies chest pain or palpitations  ?Gastrointestinal: denies abdominal pain, N/V ?Genitourinary: denies burning with urination or urinary frequency ?Musculoskeletal: + neck pain   ? ?Vital signs in last 24 hours: [min-max] current  ?Temp:  [97.5 ?F (36.4 ?C)-98.5 ?F (36.9 ?C)] 98.5 ?F (36.9 ?C) (04/28 0802) ?Pulse Rate:  [53-63] 63 (04/28 0802) ?Resp:  [13-22] 16 (04/28 0802) ?BP: (101-151)/(60-89) 101/89 (04/28 0802) ?SpO2:  [85 %-100 %] 96 % (04/28 0802) ?Weight:  [90 kg-91.5 kg] 90 kg (04/27 1345)     Height: '5\' 8"'$  (172.7 cm) Weight: 90 kg BMI (Calculated): 30.18  ? ?Intake/Output last 2 shifts:  ?04/27 0701 - 04/28 0700 ?In: -  ?Out: 2004 [Urine:500]  ? ?Physical Exam:  ?Constitutional: alert, cooperative and no distress  ?HENT: normocephalic without obvious abnormality  ?Neck: HD catheter present ?Eyes: PERRL, EOM's grossly intact and symmetric  ?Respiratory: breathing non-labored at rest  ?Cardiovascular: regular rate and sinus rhythm  ?Gastrointestinal: soft, non-tender, and non-distended ?Musculoskeletal: no edema or wounds, motor and sensation grossly intact, NT  ? ? ?Labs:  ? ?  Latest Ref Rng & Units 05/26/2021  ?  6:23 AM 05/25/2021  ?  6:04 AM 05/24/2021  ?  5:19 AM  ?CBC  ?WBC 4.0 - 10.5 K/uL 6.2   5.3   6.1    ?Hemoglobin 13.0 - 17.0 g/dL 10.7   10.1   10.0    ?Hematocrit 39.0 - 52.0 % 34.7   32.7    32.6    ?Platelets 150 - 400 K/uL 149   145   155    ? ? ?  Latest Ref Rng & Units 05/26/2021  ?  6:23 AM 05/25/2021  ?  6:04 AM 05/24/2021  ?  5:19 AM  ?CMP  ?Glucose 70 - 99 mg/dL 151   127   228    ?BUN 8 - 23 mg/dL 31   46   66    ?Creatinine 0.61 - 1.24 mg/dL 1.87   2.17   2.83    ?Sodium 135 - 145 mmol/L 137   138   135    ?Potassium 3.5 - 5.1 mmol/L 3.9   3.7   3.7    ?Chloride 98 - 111 mmol/L 101   101   102    ?CO2 22 - 32 mmol/L '27   29   28    '$ ?Calcium 8.9 - 10.3 mg/dL 8.3   8.1   7.9    ? ? ?Imaging studies: No new pertinent imaging studies ? ? ?Assessment/Plan: (ICD-10's: N18.6) ?81 y.o. male with worsening renal function in setting of CHF ? ? - From a surgical perspective, we are awaiting cardiology clearance for peritoneal dialysis catheter placement. If cleared, we will plan for placement on Tuesday 05/02. If not cleared for surgery this admission, we will plan to follow up as outpatient for surgical planning ? - Nothing further from general surgery standpoint  at the moment   ? ? ?All of the above findings and recommendations were discussed with the patient, and the medical team, and all of patient's questions were answered to his expressed satisfaction. ? ? ?-- ?Edison Simon, PA-C ?[facility] ?05/26/2021, 8:40 AM ?M-F: 7am - 4pm ? ?

## 2021-05-26 NOTE — Consult Note (Addendum)
ANTICOAGULATION CONSULT NOTE ? ?Pharmacy Consult for IV Heparin ?Indication: atrial fibrillation ? ?Patient Measurements: ?Height: '5\' 8"'$  (172.7 cm) ?Weight: 90 kg (198 lb 6.6 oz) ?IBW/kg (Calculated) : 68.4 ?Heparin Dosing Weight: 86.85 kg ? ?Labs: ?Recent Labs  ?  05/24/21 ?1829 05/25/21 ?9371 05/26/21 ?6967  ?HGB 10.0* 10.1* 10.7*  ?HCT 32.6* 32.7* 34.7*  ?PLT 155 145* 149*  ?CREATININE 2.83* 2.17* 1.87*  ? ? ?Estimated Creatinine Clearance: 33.2 mL/min (A) (by C-G formula based on SCr of 1.87 mg/dL (H)). ? ? ?Medical History: ?Past Medical History:  ?Diagnosis Date  ? 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 Aortic heart valve narrowing 12/31/2013  ? Overview:  Sp #23 pericardial edwards valve repalcement 2016   ? Aortic stenosis   ? a. 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 Glower MD @ [facility] via R thoracic approach; b. 03/2018 Echo: EF 60-65%, DD. Mildly dil LA. Nl fxn AoV prosthesis. Grad 64mHg.  ? Basal cell carcinoma 05/15/2017  ? right nose above alar crease  ? CKD (chronic kidney disease) stage 3, GFR 30-59 ml/min (HCC) 06/17/2014  ? Diabetes mellitus without complication [place]   ? Hypertension   ? 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 Low serum HDL 10/26/2015  ? Non-obstructive CAD (coronary artery disease)   ? a. 12/2013 Cath ([facility]): LAD 30p, RI 60, otw nl ([facility] [place] [facility]; b. 03/2018 MV: EF 59%, no ischemia/scar.  ? Persistent atrial fibrillation ([place] 10[place]  ? a. CHA2DS2VASc = 5-->Eliquis; b. 12/2017 loaded w/amio-->DCCV.  ? 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 a. 05/2014 s/p RFCA @ [facility].  ? Right inguinal hernia   ? Urinary incontinence   ? ? ?Medications:  ?Apixaban 2.5 mg BID (last dose 4/27 AM) ? 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 Apixaban placed on hold for PermCath insertion 4/28 and for  further plans of PD catheter placement tentatively for 5/2. Pharmacy has been consulted for management of IV heparin peri-operatively for Afib while apixaban is on hold.  ? ?CBC notable for stable anemia and mild thrombocytopenia. Baseline aPTT, PT-INR, and heparin level are pending. ? ?Per vascular surgery, heparin can be started 3 hours after PermCath placement if no bleeding, without bolus. ? ?Per general surgery, will hold IV heparin 6 hours prior to PD catheter placement which is tentatively scheduled for 5/2 at 1130 am. ? ?Goal of Therapy:  ?Heparin level 0.3-0.7 units/ml ?aPTT 66-102 seconds ?Monitor platelets by anticoagulation protocol: Yes ?  ?Plan:  ?1). Wait for 3 hours after PermCath placement and then confirm with RN no issues with bleeding. Follow-up baseline coags ?2). If no issues identified with above, start heparin at 1200 units/hr, no bolus ?3). Check aPTT 8 hours after initiation of infusion. Plan to follow aPTT given expected interference of apixaban on heparin level (pending baseline heparin level). Continue to follow aPTT until correlation established with heparin level and then switch over to heparin level monitoring only ?4). Daily CBC per protocol while on IV heparin ?5). Follow-up scheduling for PD catheter placement (tentatively 5/2 at 1130 am); plan to stop IV heparin 6 hours prior to procedure (tentatively 5/2 at 0500) ? ?Update: After further discussion between care team and patient's son, it has been decided to forego PD catheter placement. Plan is to resume home apixaban 4/28 evening after  PermCath placement ? ?Benita Gutter ?05/26/2021,2:19 PM ? ? ?

## 2021-05-26 NOTE — Progress Notes (Signed)
?[facility]  ?ROUNDING NOTE  ? ?Subjective:  ? ?Timothy Riley is a 81 year old male with past medical history including CAD, hypertension, A-fib with prior cardioversion on Eliquis, and CKD 3B.  Patient presents to the emergency room with complaints of shortness of breath and increased lower extremity edema.  Patient has been admitted for CHF exacerbation ([place]) [I50.9] ?Hypervolemia, unspecified hypervolemia type [E87.70] ? ?Patient is known to our practice and was followed by Dr. Candiss Norse.  Patient currently states he does not currently follow a nephrologist due to continued decline of renal function.  Patient states that diuresis is managed by cardiologist, Dr. Rockey Situ.  ? ?Update ?Patient resting quietly ?States about left next temp cath dressing too tight and cumbersome.  ?Feels better and ready for discharge. ?Edema improving ? ?Objective:  ?Vital signs in last 24 hours:  ?Temp:  [97.5 ?F (36.4 ?C)-98.5 ?F (36.9 ?C)] 98.5 ?F (36.9 ?C) (04/28 0802) ?Pulse Rate:  [53-63] 63 (04/28 0802) ?Resp:  [13-22] 16 (04/28 0802) ?BP: (101-151)/(60-89) 101/89 (04/28 0802) ?SpO2:  [85 %-100 %] 96 % (04/28 0802) ?Weight:  [90 kg] 90 kg (04/27 1345) ? ?Weight change: 1.9 kg ?Filed Weights  ? 05/24/21 1600 05/25/21 1028 05/25/21 1345  ?Weight: 85 kg 91.5 kg 90 kg  ? ? ?Intake/Output: ?I/O last 3 completed shifts: ?In: -  ?Out: 2329 [Urine:825; HWEXH:3716] ?  ?Intake/Output this shift: ? Total I/O ?In: -  ?Out: 250 [Urine:250] ? ?Physical Exam: ?General: NAD  ?Head: Normocephalic, atraumatic. Moist oral mucosal membranes  ?Eyes: Anicteric  ?Lungs:  Clear to auscultation, normal effort  ?Heart: Regular rate and rhythm  ?Abdomen:  Soft, nontender, distended  ?Extremities: 3+ peripheral edema. Abdominal wall edema  ?Neurologic: Nonfocal, moving all four extremities  ?Skin: No lesions  ?Access: Left IJ temp cath placed on 05/23/2021 by Dr. Delana Meyer  ? ? ?Basic Metabolic Panel: ?Recent Labs  ?Lab 05/22/21 ?0410  05/23/21 ?0455 05/24/21 ?9678 05/25/21 ?9381 05/26/21 ?0175  ?NA 137 138 135 138 137  ?K 4.9 4.1 3.7 3.7 3.9  ?CL 99 99 102 101 101  ?CO2 '30 30 28 29 27  '$ ?GLUCOSE 168* 158* 228* 127* 151*  ?BUN 90* 88* 66* 46* 31*  ?CREATININE 4.37* 3.99* 2.83* 2.17* 1.87*  ?CALCIUM 8.4* 8.5* 7.9* 8.1* 8.3*  ?MG  --   --   --  2.4 2.3  ? ? ? ?Liver Function Tests: ?No results for input(s): AST, ALT, ALKPHOS, BILITOT, PROT, ALBUMIN in the last 168 hours. ? ?No results for input(s): LIPASE, AMYLASE in the last 168 hours. ?No results for input(s): AMMONIA in the last 168 hours. ? ?CBC: ?Recent Labs  ?Lab 05/22/21 ?0410 05/23/21 ?0455 05/24/21 ?1025 05/25/21 ?8527 05/26/21 ?7824  ?WBC 6.5 5.1 6.1 5.3 6.2  ?HGB 10.9* 10.9* 10.0* 10.1* 10.7*  ?HCT 35.3* 35.3* 32.6* 32.7* 34.7*  ?MCV 91.0 90.7 89.3 89.8 90.8  ?PLT 192 174 155 145* 149*  ? ? ? ?Cardiac Enzymes: ?No results for input(s): CKTOTAL, CKMB, CKMBINDEX, TROPONINI in the last 168 hours. ? ?BNP: ?Invalid input(s): POCBNP ? ?CBG: ?Recent Labs  ?Lab 05/24/21 ?2305 05/25/21 ?0407 05/25/21 ?0801 05/25/21 ?1613 05/26/21 ?0801  ?GLUCAP 129* 117* 128* 152* 152*  ? ? ? ?Microbiology: ?Results for orders placed or performed during the hospital encounter of 03/07/21  ?Resp Panel by RT-PCR (Flu A&B, Covid) Nasopharyngeal Swab     Status: None  ? Collection Time: 03/07/21 11:21 PM  ? Specimen: Nasopharyngeal Swab; Nasopharyngeal(NP) swabs in vial transport medium  ?Result  Value Ref Range Status  ? SARS Coronavirus 2 by RT PCR NEGATIVE NEGATIVE Final  ?  Comment: (NOTE) ?SARS-CoV-2 target nucleic acids are NOT DETECTED. ? ?The SARS-CoV-2 RNA is generally detectable in upper respiratory ?specimens during the acute phase of infection. The lowest ?concentration of SARS-CoV-2 viral copies this assay can detect is ?138 copies/mL. A negative result does not preclude SARS-Cov-2 ?infection and should not be used as the sole basis for treatment or ?other patient management decisions. A negative result may  occur with  ?improper specimen collection/handling, submission of specimen other ?than nasopharyngeal swab, presence of viral mutation(s) within the ?areas targeted by this assay, and inadequate number of viral ?copies(<138 copies/mL). A negative result must be combined with ?clinical observations, patient history, and epidemiological ?information. The expected result is Negative. ? ?Fact Sheet for Patients:  ?EntrepreneurPulse.com.au ? ?Fact Sheet for Healthcare Providers:  ?IncredibleEmployment.be ? ?This test is no t yet approved or cleared by the [place] FDA and  ?has been authorized for detection and/or diagnosis of SARS-CoV-2 by ?FDA under an Emergency Use Authorization (EUA). This EUA will remain  ?in effect (meaning this test can be used) for the duration of the ?COVID-19 declaration under Section 564(b)(1) of the Act, 21 ?U.S.C.section 360bbb-3(b)(1), unless the authorization is terminated  ?or revoked sooner.  ? ? ?  ? Influenza A by PCR NEGATIVE NEGATIVE Final  ? Influenza B by PCR NEGATIVE NEGATIVE Final  ?  Comment: (NOTE) ?The Xpert Xpress SARS-CoV-2/FLU/RSV plus assay is intended as an aid ?in the diagnosis of influenza from Nasopharyngeal swab specimens and ?should not be used as a sole basis for treatment. Nasal washings and ?aspirates are unacceptable for Xpert Xpress SARS-CoV-2/FLU/RSV ?testing. ? ?Fact Sheet for Patients: ?EntrepreneurPulse.com.au ? ?Fact Sheet for Healthcare Providers: ?IncredibleEmployment.be ? ?This test is not yet approved or cleared by the [place] FDA and ?has been authorized for detection and/or diagnosis of SARS-CoV-2 by ?FDA under an Emergency Use Authorization (EUA). This EUA will remain ?in effect (meaning this test can be used) for the duration of the ?COVID-19 declaration under Section 564(b)(1) of the Act, 21 U.S.C. ?section 360bbb-3(b)(1), unless the authorization is terminated  or ?revoked. ? ?Performed at [facility], [place], ?[place] [address] ?  ?Culture, blood (routine x 2)     Status: None  ? Collection Time: 03/08/21  1:07 AM  ? Specimen: BLOOD  ?Result Value Ref Range Status  ? Specimen Description BLOOD RIGHT HAND  Final  ? Special Requests   Final  ?  BOTTLES DRAWN AEROBIC AND ANAEROBIC Blood Culture results may not be optimal due to an inadequate volume of blood received in culture bottles  ? Culture   Final  ?  NO GROWTH 5 DAYS ?Performed at [facility], [address], [place], [place] [address] ?  ? Report Status 03/13/2021 FINAL  Final  ?Urine Culture     Status: Abnormal  ? Collection Time: 03/08/21  1:17 AM  ? Specimen: Urine, Clean Catch  ?Result Value Ref Range Status  ? Specimen Description   Final  ?  URINE, CLEAN CATCH ?Performed at [facility], [address], [place], [place] [address] ?  ? Special Requests   Final  ?  NONE ?Performed at [facility], [address], [place], [place] [address] ?  ? Culture (A)  Final  ?  20,000 COLONIES/mL STREPTOCOCCUS AGALACTIAE ?TESTING AGAINST S. AGALACTIAE NOT ROUTINELY PERFORMED DUE TO PREDICTABILITY OF AMP/PEN/VAN SUSCEPTIBILITY. ?  Performed at [facility], [place] [address], [place], [place] [address] ?  ? Report Status 03/09/2021 FINAL  Final  ?Culture, blood (routine x 2)     Status: None  ? Collection Time: 03/08/21  2:02 AM  ? Specimen: BLOOD  ?Result Value Ref Range Status  ? Specimen Description BLOOD RIGHT HAND  Final  ? Special Requests   Final  ?  BOTTLES DRAWN AEROBIC AND ANAEROBIC Blood Culture adequate volume  ? Culture   Final  ?  NO GROWTH 5 DAYS ?Performed at [facility], [address], [place], [place] [address] ?  ? Report Status 03/13/2021 FINAL  Final  ? ? ?Coagulation Studies: ?No results for input(s): LABPROT, INR in the last 72 hours. ? ?Urinalysis: ?No results for input(s): COLORURINE, LABSPEC, [place], GLUCOSEU, HGBUR,  BILIRUBINUR, KETONESUR, PROTEINUR, UROBILINOGEN, NITRITE, LEUKOCYTESUR in the last 72 hours. ? ?Invalid input(s): APPERANCEUR  ? ? ?Imaging: ?No results found. ? ? ?Medications:  ? ? ? ? [START ON 05/28/2021] amiodarone  200 mg O

## 2021-05-26 NOTE — Care Management Important Message (Signed)
Important Message ? ?Patient Details  ?Name: Timothy Riley [facility] ?MRN: [id number] ?Date of Birth: 1940/12/28 ? ? ?Medicare Important Message Given:  Yes ? ? ? ? ?[name] ?05/26/2021, 11:09 AM ?

## 2021-05-26 NOTE — TOC Progression Note (Signed)
Transition of Care (TOC) - Progression Note  ? ? ?Patient Details  ?Name: Timothy Riley [facility] ?MRN: [id number] ?Date of Birth: 1940-10-28 ? ?Transition of Care (TOC) CM/SW Contact  ?Laurena Slimmer, RN ?Phone Number: ?05/26/2021, 4:21 PM ? ?Clinical Narrative:    ?Spoke with patient's Son. Patient's son stated he did not have a preference of HH. Contacted [facility]. Patient accepted. Patient son advised patient may discharge 4/29. Son stated he was not in town and none one would be home with patient. ? ? ?  ?  ? ?Expected Discharge Plan and Services ?  ?  ?  ?  ?  ?                ?  ?  ?  ?  ?  ?  ?  ?  ?  ?  ? ? ?Social Determinants of Health (SDOH) Interventions ?  ? ?Readmission Risk Interventions ?   ? View : No data to display.  ?  ?  ?  ? ? ?

## 2021-05-26 NOTE — TOC Progression Note (Signed)
Transition of Care (TOC) - Progression Note  ? ? ?Patient Details  ?Name: Timothy Riley [facility] ?MRN: [id number] ?Date of Birth: January 23, 1941 ? ?Transition of Care (TOC) CM/SW Contact  ?Laurena Slimmer, RN ?Phone Number: ?05/26/2021, 12:50 PM ? ?Clinical Narrative:    ?Attempt to contact son for choice of home health provider, no answer. Unable to leave a message.  ? ? ?  ?  ? ?Expected Discharge Plan and Services ?  ?  ?  ?  ?  ?                ?  ?  ?  ?  ?  ?  ?  ?  ?  ?  ? ? ?Social Determinants of Health (SDOH) Interventions ?  ? ?Readmission Risk Interventions ?   ? View : No data to display.  ?  ?  ?  ? ? ?

## 2021-05-26 NOTE — Interval H&P Note (Signed)
History and Physical Interval Note: ? ?05/26/2021 ?1:44 PM ? ?Timothy Riley  has presented today for surgery, with the diagnosis of end stage renal disease.  The various methods of treatment have been discussed with the patient and family. After consideration of risks, benefits and other options for treatment, the patient has consented to  Procedure(s): ?DIALYSIS/PERMA CATHETER INSERTION (N/A) as a surgical intervention.  The patient's history has been reviewed, patient examined, no change in status, stable for surgery.  I have reviewed the patient's chart and labs.  Questions were answered to the patient's satisfaction.   ? ? ?[name] ? ? ?

## 2021-05-26 NOTE — Progress Notes (Signed)
Hemodialysis placement resolved: [facility] ([address] TTS 11:45am. Start date 5/2 at 10:45am. Son is aware.  ?

## 2021-05-26 NOTE — Progress Notes (Addendum)
? ?Progress Note ? ?Patient Name: [facility] ?Date of Encounter: 05/26/2021 ? ?[facility] Cardiologist: Ida Rogue, MD  ? ?Subjective  ? ?N.p.o. for permacath placement this morning with vascular surgery ?Denies significant shortness of breath, no chest pain concerning for angina ?Maintaining normal sinus rhythm ?Reports feeling hot and cold at times, thinks it is from his door to the hallway open will feel cold but room air is too hot ?Some constipation, started drinking more water ? ?Inpatient Medications  ?  ?Scheduled Meds: ? [START ON 05/28/2021] amiodarone  200 mg Oral Daily  ? amiodarone  400 mg Oral BID  ? carvedilol  6.25 mg Oral BID WC  ? Chlorhexidine Gluconate Cloth  6 each Topical Q0600  ? docusate sodium  200 mg Oral BID  ? feeding supplement (NEPRO CARB STEADY)  237 mL Oral TID  ? hydrocortisone cream   Topical BID  ? polyethylene glycol  17 g Oral Daily  ? QUEtiapine  25 mg Oral QHS  ? rosuvastatin  10 mg Oral Daily  ? ?Continuous Infusions: ? ?PRN Meds: ?acetaminophen **OR** acetaminophen, ALPRAZolam, bisacodyl, loperamide, melatonin, morphine injection, ondansetron **OR** ondansetron (ZOFRAN) IV, oxyCODONE-acetaminophen  ? ?Vital Signs  ?  ?Vitals:  ? 05/25/21 1905 05/26/21 0007 05/26/21 0329 05/26/21 0802  ?BP: 130/67 134/72 (!) 151/74 101/89  ?Pulse: (!) 57 (!) 58 (!) 57 63  ?Resp: '17 18 18 16  '$ ?Temp: 98.1 ?F (36.7 ?C) (!) 97.5 ?F (36.4 ?C) 98.1 ?F (36.7 ?C) 98.5 ?F (36.9 ?C)  ?TempSrc: Oral  Oral   ?SpO2: 100% 100%  96%  ?Weight:      ?Height:      ? ? ?Intake/Output Summary (Last 24 hours) at 05/26/2021 1000 ?Last data filed at 05/26/2021 6301 ?Gross per 24 hour  ?Intake --  ?Output 2004 ml  ?Net -2004 ml  ? ? ?  05/25/2021  ?  1:45 PM 05/25/2021  ? 10:28 AM 05/24/2021  ?  4:00 PM  ?Last 3 Weights  ?Weight (lbs) 198 lb 6.6 oz 201 lb 11.5 oz 187 lb 6.3 oz  ?Weight (kg) 90 kg 91.5 kg 85 kg  ?   ? ?Telemetry  ?  ?Normal sinus rhythm- Personally Reviewed ? ?ECG  ?  ? - Personally  Reviewed ? ?Physical Exam  ? ?GEN: No acute distress.   ?Neck: No JVD ?Cardiac: RRR, no murmurs, rubs, or gallops.  ?Respiratory: Clear to auscultation bilaterally. ?GI: Soft, nontender, non-distended  ?MS: No edema; No deformity. ?Neuro:  Nonfocal  ?Psych: Normal affect  ? ?Labs  ?  ?High Sensitivity Troponin:   ?Recent Labs  ?Lab 05/13/21 ?2037 05/14/21 ?6010  ?TROPONINIHS 20* 19*  ?   ?Chemistry ?Recent Labs  ?Lab 05/24/21 ?9323 05/25/21 ?5573 05/26/21 ?2202  ?NA 135 138 137  ?K 3.7 3.7 3.9  ?CL 102 101 101  ?CO2 '28 29 27  '$ ?GLUCOSE 228* 127* 151*  ?BUN 66* 46* 31*  ?CREATININE 2.83* 2.17* 1.87*  ?CALCIUM 7.9* 8.1* 8.3*  ?MG  --  2.4 2.3  ?GFRNONAA 22* 30* 35*  ?ANIONGAP '5 8 9  '$ ?  ?Lipids No results for input(s): CHOL, TRIG, HDL, LABVLDL, LDLCALC, CHOLHDL in the last 168 hours.  ?Hematology ?Recent Labs  ?Lab 05/24/21 ?5427 05/25/21 ?0623 05/26/21 ?7628  ?WBC 6.1 5.3 6.2  ?RBC 3.65* 3.64* 3.82*  ?HGB 10.0* 10.1* 10.7*  ?HCT 32.6* 32.7* 34.7*  ?MCV 89.3 89.8 90.8  ?MCH 27.4 27.7 28.0  ?MCHC 30.7 30.9 30.8  ?RDW 18.2* 17.9*  17.9*  ?PLT 155 145* 149*  ? ?Thyroid No results for input(s): TSH, FREET4 in the last 168 hours.  ?BNPNo results for input(s): BNP, PROBNP in the last 168 hours.  ?DDimer No results for input(s): DDIMER in the last 168 hours.  ? ?Radiology  ?  ?No results found. ? ?Cardiac Studies  ? ? ? ?Patient Profile  ?   ?81 y.o. male with history of persistent atrial fibrillation, severe aortic stenosis s/p AVR (2016), HF, SVT s/p ablation, DM2, and HTN admitted with acute HFrEF in the setting of atrial fibrillation ? ?Assessment & Plan  ?  ?Preop cardiovascular evaluation ?Acceptable risk for procedure today, permacath placement and PD cath surgery ?Eliquis on hold ? ?Acute on chronic diastolic and systolic CHF ?EF 30 to 35% before and after cardioversion ?Symptoms exacerbated by anemia, atrial fibrillation, high fluid intake at home ?Has had several days of aggressive diuresis now with worsening renal  function  ?-Nephrology following, requires dialysis ?-Not on ACE, ARB, Entresto secondary to chronic renal failure ?For now continue Coreg 6.25 twice daily, amiodarone load 400 twice daily,  ?Ejection fraction has remained low despite normal sinus rhythm ?EF remains 30 to 35% ?  ?Persistent atrial fibrillation ?Continue Coreg, amiodarone load,  ?Eliquis on hold for procedure ?Maintaining normal sinus rhythm ? ?Cardiomyopathy ?Drop in ejection fraction May 14, 2021 in the setting of atrial fibrillation ?Previously it was 50 to 55% in August 2022 ?Repeat echo confirming ejection fraction 30 to 35%,  ?--- Not a good candidate for ischemic work-up/catheterization given renal dysfunction. ?--- By report had prior catheterization with no significant disease but given low ejection fraction, ?Consider Myoview as outpatient ?  ?Acute on chronic renal failure ?Underlying renal dysfunction stage III in the setting of diabetes ?Worsening creatinine this admission with diuresis, ?Followed by nephrology, requiring dialysis ? ? Total encounter time more than 40 minutes ? Greater than 50% was spent in counseling and coordination of care with the patient ? ? ? ?For questions or updates, please contact [place] ?Please consult [email or web address] for contact info under  ? ?  ?   ?Signed, ?Ida Rogue, MD  ?05/26/2021, 10:00 AM    ?

## 2021-05-26 NOTE — Progress Notes (Signed)
Following patient for outpatient hemodialysis placement. Referral sent to [facility] [address] currently waiting on clinical acceptance.  ?

## 2021-05-27 DIAGNOSIS — I5021 Acute systolic (congestive) heart failure: Secondary | ICD-10-CM | POA: Diagnosis not present

## 2021-05-27 LAB — CBC
HCT: 32.2 % — ABNORMAL LOW (ref 39.0–52.0)
Hemoglobin: 10 g/dL — ABNORMAL LOW (ref 13.0–17.0)
MCH: 27.9 pg (ref 26.0–34.0)
MCHC: 31.1 g/dL (ref 30.0–36.0)
MCV: 89.9 fL (ref 80.0–100.0)
Platelets: 131 10*3/uL — ABNORMAL LOW (ref 150–400)
RBC: 3.58 MIL/uL — ABNORMAL LOW (ref 4.22–5.81)
RDW: 17.8 % — ABNORMAL HIGH (ref 11.5–15.5)
WBC: 5.6 10*3/uL (ref 4.0–10.5)
nRBC: 0 % (ref 0.0–0.2)

## 2021-05-27 LAB — BASIC METABOLIC PANEL
Anion gap: 7 (ref 5–15)
BUN: 38 mg/dL — ABNORMAL HIGH (ref 8–23)
CO2: 31 mmol/L (ref 22–32)
Calcium: 8.5 mg/dL — ABNORMAL LOW (ref 8.9–10.3)
Chloride: 101 mmol/L (ref 98–111)
Creatinine, Ser: 2.23 mg/dL — ABNORMAL HIGH (ref 0.61–1.24)
GFR, Estimated: 29 mL/min — ABNORMAL LOW (ref >60)
Glucose, Bld: 180 mg/dL — ABNORMAL HIGH (ref 70–99)
Potassium: 3.9 mmol/L (ref 3.5–5.1)
Sodium: 139 mmol/L (ref 135–145)

## 2021-05-27 LAB — MAGNESIUM: Magnesium: 2.3 mg/dL (ref 1.7–2.4)

## 2021-05-27 MED ORDER — QUETIAPINE FUMARATE 25 MG PO TABS
50.0000 mg | ORAL_TABLET | Freq: Every day | ORAL | Status: DC
  Filled 2021-05-27: qty 2

## 2021-05-27 MED ORDER — HEPARIN SODIUM (PORCINE) 1000 UNIT/ML IJ SOLN
INTRAMUSCULAR | Status: AC
  Filled 2021-05-27: qty 10

## 2021-05-27 MED ORDER — APIXABAN 2.5 MG PO TABS
2.5000 mg | ORAL_TABLET | Freq: Two times a day (BID) | ORAL | Status: DC
  Administered 2021-05-27 – 2021-05-29 (×4): 2.5 mg via ORAL
  Filled 2021-05-27 (×4): qty 1

## 2021-05-27 NOTE — Progress Notes (Signed)
Physical Therapy Treatment ?Patient Details ?Name: [facility] ?MRN: [id number] ?DOB: Sep 27, 1940 ?Today's Date: 05/27/2021 ? ? ?History of Present Illness Martice Doty is an 65yoM admitted for acute on chronic heart failure and is currently s/p cardioversion. Medical history significant for Persistent A-fib with prior cardioversion, on Eliquis, s/p bioprosthetic AVR, G2 DD (EF 50 to 55% 08/2020), diabetes, nonobstructive CAD with low risk Myoview in 2020, HTN, CKD 3B,hospitalized from 2/8 to 03/15/2021 with acute gastroenteritis and CHF exacerbation  complicated by AKI related to diuretic therapy who presents to the ED  with a complaint of shortness of breath, abdominal distention and bilateral lower extremity edema that has not been responding to outpatient therapy. ? ?  ?PT Comments  ? ? Pt was long sitting in bed oriented to self and location however not time or situation. He remains extremely lethargic throughout  limited session. Pt required extensive assistance to exit bed, stand, and ambulate very short distance with RW. He has several occasions of LOB with author intervention to prevent falling. Author does feel pt 's lethargy greatly impacts his performance this morning. RN/MD made aware. Pt is scheduled for HD this date. Prior to this session, PT has been recommending home with HHPT at [place]. However after this session, PT is concerned that pt is not going to be able to safely manage at home alone. We will continue to follow and progress as able per current POC. RN/RN tech suppose to inform Pryor Curia if/when pt becomes more alert/awake to perform another PT session.  ?  ?Recommendations for follow up therapy are one component of a multi-disciplinary discharge planning process, led by the attending physician.  Recommendations may be updated based on patient status, additional functional criteria and insurance authorization. ? ?Follow Up Recommendations ? Follow physician's recommendations for discharge plan and  follow up therapies (pt did not perform well this session. May need to update recs to SNF if pt does not demonstrate improvements. Pt's lethargy greatly impacts his abilities and safely perform all mobility, transfers, and gait.) ?  ?  ?Assistance Recommended at Discharge Frequent or constant Supervision/Assistance  ?Patient can return home with the following A lot of help with walking and/or transfers;A lot of help with bathing/dressing/bathroom;Assistance with cooking/housework;Assistance with feeding;Direct supervision/assist for medications management;Direct supervision/assist for financial management;Assist for transportation;Help with stairs or ramp for entrance ?  ?Equipment Recommendations ? Rolling walker (2 wheels);BSC/3in1  ?  ?   ?Precautions / Restrictions Precautions ?Precautions: Fall ?Restrictions ?Weight Bearing Restrictions: No  ?  ? ?Mobility ? Bed Mobility ?Overal bed mobility: Needs Assistance ?Bed Mobility: Supine to Sit, Sit to Supine ?  ?  ?Supine to sit: Min assist ?Sit to supine: Mod assist ?  ?General bed mobility comments: pt required assistance to safely exit bed and to return to bed after OOB activity. Pt required much more assistance than previous PT session. Author questions if due to medicine issued previous night. ?  ? ?Transfers ?Overall transfer level: Needs assistance ?Equipment used: Rolling walker (2 wheels) ?Transfers: Sit to/from Stand ?Sit to Stand: Min assist, Mod assist, From elevated surface ?  ?  ?  ?  ?  ?General transfer comment: pt required more assistance to stand to AD form elevated bed height. Pt's alertness throughout session greatly impacts pt's abilities. ?  ? ?Ambulation/Gait ?Ambulation/Gait assistance: Min assist, Mod assist ?Gait Distance (Feet): 15 Feet ?Assistive device: Rolling walker (2 wheels) ?Gait Pattern/deviations: Step-to pattern, Knees buckling, Shuffle, Staggering left, Staggering right, Trunk flexed ?Gait velocity: decreased ?  ?  ?  General Gait  Details: pt has extremely poor gait this morning. He has knee buckling present and is high fall risk throughout. Last time working with PT, pt was able to ambulate without AD. currently requiring constant assistance to ambulate with RW. Pt also fell 3 x during very short gait distances. ? ? ?  ?Balance Overall balance assessment: Needs assistance ?Sitting-balance support: Feet supported, Bilateral upper extremity supported ?Sitting balance-Leahy Scale: Fair ?Sitting balance - Comments: required mostly close SBA but some CGA at times due to pt lethargy ?  ?Standing balance support: Bilateral upper extremity supported, During functional activity, Reliant on assistive device for balance ?Standing balance-Leahy Scale: Poor ?Standing balance comment: pt is extremely high fall risk when up on his feet this date. Pt needed 3 interventions to prevent falling with very short gait distances. ?  ?  ?  ?Cognition Arousal/Alertness: Lethargic, Suspect due to medications (Extremely lethargic) ?Behavior During Therapy: Flat affect (LETHARGIC!!!) ?Overall Cognitive Status: No family/caregiver present to determine baseline cognitive functioning ?Area of Impairment: Orientation, Attention, Safety/judgement, Awareness, Problem solving ?  ?  ?   ?General Comments: Pt is extremely lethargic this morning. Author feels may be due to sequel given previous night. Pt has poor awareness of situation and c/o being unable to even stand up to pee earlier. ?  ?  ? ?  ?   ?   ? ?Pertinent Vitals/Pain Pain Assessment ?Pain Assessment: 0-10 ?Pain Score: 4  ?Pain Location: new port site ?Pain Descriptors / Indicators: Discomfort ?Pain Intervention(s): Limited activity within patient's tolerance, Monitored during session, Premedicated before session, Repositioned  ? ? ? ?PT Goals (current goals can now be found in the care plan section) Acute Rehab PT Goals ?Patient Stated Goal: none stated ?Progress towards PT goals: Not progressing toward goals -  comment ? ?  ?Frequency ? ? ? Min 2X/week ? ? ? ?  ?PT Plan Discharge plan needs to be updated  ? ? ?   ?AM-PAC PT "6 Clicks" Mobility   ?Outcome Measure ? Help needed turning from your back to your side while in a flat bed without using bedrails?: A Lot ?Help needed moving from lying on your back to sitting on the side of a flat bed without using bedrails?: A Lot ?Help needed moving to and from a bed to a chair (including a wheelchair)?: A Lot ?Help needed standing up from a chair using your arms (e.g., wheelchair or bedside chair)?: A Lot ?Help needed to walk in hospital room?: A Lot ?Help needed climbing 3-5 steps with a railing? : A Lot ?6 Click Score: 12 ? ?  ?End of Session   ?Activity Tolerance: Patient limited by lethargy;Patient limited by fatigue ?Patient left: in bed;with call bell/phone within reach;with bed alarm set ?Nurse Communication: Mobility status ?PT Visit Diagnosis: Unsteadiness on feet (R26.81);Muscle weakness (generalized) (M62.81);Difficulty in walking, not elsewhere classified (R26.2) ?  ? ? ?Time: 4034-7425 ?PT Time Calculation (min) (ACUTE ONLY): 21 min ? ?Charges:  $Therapeutic Activity: 8-22 mins          ?          ? ?Julaine Fusi PTA ?05/27/21, 8:01 AM  ? ?

## 2021-05-27 NOTE — Progress Notes (Signed)
Physical Therapy Treatment ?Patient Details ?Name: Timothy Riley [facility] ?MRN: [id number] ?DOB: 07/12/1940 ?Today's Date: 05/27/2021 ? ? ?History of Present Illness Timothy Riley is an 47yoM admitted for acute on chronic heart failure and is currently s/p cardioversion. Medical history significant for Persistent A-fib with prior cardioversion, on Eliquis, s/p bioprosthetic AVR, G2 DD (EF 50 to 55% 08/2020), diabetes, nonobstructive CAD with low risk Myoview in 2020, HTN, CKD 3B,hospitalized from 2/8 to 03/15/2021 with acute gastroenteritis and CHF exacerbation  complicated by AKI related to diuretic therapy who presents to the ED  with a complaint of shortness of breath, abdominal distention and bilateral lower extremity edema that has not been responding to outpatient therapy. ? ?  ?PT Comments  ? ? Author returned as requested. Pt is much more alert than a few hours prior. He was able to demonstrate safer mobility, transfers, and gait (limited distance due to transport arriving to take pt to HD) MD made aware that pt did better this session versus previous one. He was able to exit bed stand, and ambulate ~ 50 ft with RW. PT will continue to follow and progress as able. Pt does not have assistance available at home today but does have a son who will be able to assist him tomorrow (per pt). PT will return tomorrow and continue to follow per current POC.  ?  ?Recommendations for follow up therapy are one component of a multi-disciplinary discharge planning process, led by the attending physician.  Recommendations may be updated based on patient status, additional functional criteria and insurance authorization. ? ?Follow Up Recommendations ? Follow physician's recommendations for discharge plan and follow up therapies ?  ?  ?Assistance Recommended at Discharge Frequent or constant Supervision/Assistance  ?Patient can return home with the following A little help with walking and/or transfers;A little help with  bathing/dressing/bathroom;Assistance with cooking/housework;Assistance with feeding;Direct supervision/assist for medications management;Direct supervision/assist for financial management;Assist for transportation;Help with stairs or ramp for entrance ?  ?Equipment Recommendations ? Rolling walker (2 wheels);BSC/3in1  ?  ?   ?Precautions / Restrictions Precautions ?Precautions: Fall ?Restrictions ?Weight Bearing Restrictions: No  ?  ? ?Mobility ? Bed Mobility ?Overal bed mobility: Needs Assistance ?Bed Mobility: Supine to Sit, Sit to Supine ?  ?  ?Supine to sit: Supervision ?Sit to supine: Min assist ?  ?General bed mobility comments: pt required assistance to safely exit bed and to return to bed after OOB activity. Pt required much more assistance than previous PT session. Author questions if due to medicine issued previous night. ?  ? ?Transfers ?Overall transfer level: Needs assistance ?Equipment used: Rolling walker (2 wheels) ?Transfers: Sit to/from Stand ?Sit to Stand: Supervision ?  ?  ?  ?  ?  ?General transfer comment: pt required more assistance to stand to AD form elevated bed height. Pt's alertness throughout session greatly impacts pt's abilities. ?  ? ?Ambulation/Gait ?Ambulation/Gait assistance: Min guard ?Gait Distance (Feet): 60 Feet ?Assistive device: Rolling walker (2 wheels) ?Gait Pattern/deviations: Step-through pattern ?Gait velocity: decreased ?  ?  ?General Gait Details: distance limited by transport arriving to take pt to HD. pt does have BLE weakness. slight knee flexion after ~ 35 ft of gait training. Pt endorses severe fatigue/weakness during ambulation ? ? ?  ?Balance Overall balance assessment: Needs assistance ?Sitting-balance support: Feet supported, Bilateral upper extremity supported ?Sitting balance-Leahy Scale: Good ?Sitting balance - Comments: no sitting balance deficits observed this session ?  ?Standing balance support: Bilateral upper extremity supported, During functional  activity,  Reliant on assistive device for balance ?Standing balance-Leahy Scale: Fair ?Standing balance comment: highly recommend use of RW at all times in standing due to BLE weakness ?  ?  ?  ?  ?Cognition Arousal/Alertness: Lethargic (much less lethargic than earlier this morning) ?Behavior During Therapy: [facility] for tasks assessed/performed ?Overall Cognitive Status: No family/caregiver present to determine baseline cognitive functioning ?Area of Impairment: Orientation, Attention, Safety/judgement, Awareness, Problem solving ?  ?  ?   ?General Comments: pt is alert and orinted but still slightly lethargic. much more alert than earlier in the morning. PT session greatly limited by transport arriving to take pt to HD ?  ?  ? ?  ?   ?   ? ?Pertinent Vitals/Pain Pain Assessment ?Pain Assessment: 0-10 ?Pain Score: 3  ?Pain Location: new port site ?Pain Descriptors / Indicators: Discomfort ?Pain Intervention(s): Limited activity within patient's tolerance, Monitored during session, Premedicated before session, Repositioned  ? ? ? ?PT Goals (current goals can now be found in the care plan section) Acute Rehab PT Goals ?Patient Stated Goal: go home tomorrow ?Progress towards PT goals: Progressing toward goals ? ?  ?Frequency ? ? ? Min 2X/week ? ? ? ?  ?PT Plan Current plan remains appropriate  ? ? ?   ?AM-PAC PT "6 Clicks" Mobility   ?Outcome Measure ? Help needed turning from your back to your side while in a flat bed without using bedrails?: A Little ?Help needed moving from lying on your back to sitting on the side of a flat bed without using bedrails?: A Little ?Help needed moving to and from a bed to a chair (including a wheelchair)?: A Little ?Help needed standing up from a chair using your arms (e.g., wheelchair or bedside chair)?: A Little ?Help needed to walk in hospital room?: A Little ?Help needed climbing 3-5 steps with a railing? : A Lot ?6 Click Score: 17 ? ?  ?End of Session   ?Activity Tolerance: Patient  tolerated treatment well;Patient limited by fatigue ?Patient left: in bed;Other (comment) (transport arrived and taking pt down to HD) ?Nurse Communication: Mobility status ?PT Visit Diagnosis: Unsteadiness on feet (R26.81);Muscle weakness (generalized) (M62.81);Difficulty in walking, not elsewhere classified (R26.2) ?  ? ? ?Time: 1050-1105 ?PT Time Calculation (min) (ACUTE ONLY): 15 min ? ?Charges:  $Gait Training: 8-22 mins ?$Therapeutic Activity: 8-22 mins          ?          ? ?Julaine Fusi PTA ?05/27/21, 11:33 AM  ? ?

## 2021-05-27 NOTE — Progress Notes (Signed)
[facility]  Dialysis Note   Subjective:   Seen and examined on hemodialysis treatment.  S/p right IJ tunnel catheter placement yesterday. Patient feels weak and tired. Had a physical therapy this morning. Appetite is still poor   HEMODIALYSIS FLOWSHEET:  Blood Flow Rate (mL/min): 300 mL/min Arterial Pressure (mmHg): -130 mmHg Venous Pressure (mmHg): 160 mmHg Transmembrane Pressure (mmHg): 70 mmHg Ultrafiltration Rate (mL/min): 670 mL/min Dialysate Flow Rate (mL/min): 500 ml/min Conductivity: Machine : 13.8 Conductivity: Machine : 13.8 Dialysis Fluid Bolus: Normal Saline Bolus Amount (mL): 250 mL    Objective:  Vital signs in last 24 hours:  Temp:  [97.2 F (36.2 C)-98.7 F (37.1 C)] 97.2 F (36.2 C) (04/29 1122) Pulse Rate:  [56-65] 65 (04/29 1245) Resp:  [11-25] 21 (04/29 1245) BP: (132-173)/(65-98) 150/92 (04/29 1245) SpO2:  [90 %-100 %] 97 % (04/29 1245) Weight:  [89.7 kg-91 kg] 89.7 kg (04/29 1122)  Weight change: -0.5 kg Filed Weights   05/25/21 1345 05/27/21 0500 05/27/21 1122  Weight: 90 kg 91 kg 89.7 kg    Intake/Output: I/O last 3 completed shifts: In: 71.8 [I.V.:21.8; IV Piggyback:50] Out: 850 [Urine:850]   Intake/Output this shift:  No intake/output data recorded.  Physical Exam: General: NAD,   Head: Normocephalic, atraumatic. Moist oral mucosal membranes  Eyes: Anicteric, PERRL  Neck: Supple, trachea midline  Lungs:  Clear to auscultation  Heart: Regular rate and rhythm  Abdomen:  Soft, nontender,   Extremities: 1+ peripheral edema.  Neurologic: Nonfocal, moving all four extremities  Skin: No lesions  Access:     Basic Metabolic Panel: Recent Labs  Lab 05/23/21 0455 05/24/21 0519 05/25/21 0604 05/26/21 0623 05/27/21 0429  NA 138 135 138 137 139  K 4.1 3.7 3.7 3.9 3.9  CL 99 102 101 101 101  CO2 30 28 29 27 31   GLUCOSE 158* 228* 127* 151* 180*  BUN 88* 66* 46* 31* 38*  CREATININE 3.99* 2.83* 2.17* 1.87* 2.23*   CALCIUM 8.5* 7.9* 8.1* 8.3* 8.5*  MG  --   --  2.4 2.3 2.3    Liver Function Tests: No results for input(s): AST, ALT, ALKPHOS, BILITOT, PROT, ALBUMIN in the last 168 hours. No results for input(s): LIPASE, AMYLASE in the last 168 hours. No results for input(s): AMMONIA in the last 168 hours.  CBC: Recent Labs  Lab 05/23/21 0455 05/24/21 0519 05/25/21 0604 05/26/21 0623 05/27/21 0429  WBC 5.1 6.1 5.3 6.2 5.6  HGB 10.9* 10.0* 10.1* 10.7* 10.0*  HCT 35.3* 32.6* 32.7* 34.7* 32.2*  MCV 90.7 89.3 89.8 90.8 89.9  PLT 174 155 145* 149* 131*    Cardiac Enzymes: No results for input(s): CKTOTAL, CKMB, CKMBINDEX, TROPONINI in the last 168 hours.  BNP: Invalid input(s): POCBNP  CBG: Recent Labs  Lab 05/25/21 0801 05/25/21 1613 05/26/21 0801 05/26/21 1204 05/26/21 1511  GLUCAP 128* 152* 152* 143* 146*    Microbiology: Results for orders placed or performed during the hospital encounter of 03/07/21  Resp Panel by RT-PCR (Flu A&B, Covid) Nasopharyngeal Swab     Status: None   Collection Time: 03/07/21 11:21 PM   Specimen: Nasopharyngeal Swab; Nasopharyngeal(NP) swabs in vial transport medium  Result Value Ref Range Status   SARS Coronavirus 2 by RT PCR NEGATIVE NEGATIVE Final    Comment: (NOTE) SARS-CoV-2 target nucleic acids are NOT DETECTED.  The SARS-CoV-2 RNA is generally detectable in upper respiratory specimens during the acute phase of infection. The lowest concentration of SARS-CoV-2 viral copies this assay  can detect is 138 copies/mL. A negative result does not preclude SARS-Cov-2 infection and should not be used as the sole basis for treatment or other patient management decisions. A negative result may occur with  improper specimen collection/handling, submission of specimen other than nasopharyngeal swab, presence of viral mutation(s) within the areas targeted by this assay, and inadequate number of viral copies(<138 copies/mL). A negative result must be  combined with clinical observations, patient history, and epidemiological information. The expected result is Negative.  Fact Sheet for Patients:  BloggerCourse.com  Fact Sheet for Healthcare Providers:  SeriousBroker.it  This test is no t yet approved or cleared by the [place] FDA and  has been authorized for detection and/or diagnosis of SARS-CoV-2 by FDA under an Emergency Use Authorization (EUA). This EUA will remain  in effect (meaning this test can be used) for the duration of the COVID-19 declaration under Section 564(b)(1) of the Act, 21 U.S.C.section 360bbb-3(b)(1), unless the authorization is terminated  or revoked sooner.       Influenza A by PCR NEGATIVE NEGATIVE Final   Influenza B by PCR NEGATIVE NEGATIVE Final    Comment: (NOTE) The Xpert Xpress SARS-CoV-2/FLU/RSV plus assay is intended as an aid in the diagnosis of influenza from Nasopharyngeal swab specimens and should not be used as a sole basis for treatment. Nasal washings and aspirates are unacceptable for Xpert Xpress SARS-CoV-2/FLU/RSV testing.  Fact Sheet for Patients: BloggerCourse.com  Fact Sheet for Healthcare Providers: SeriousBroker.it  This test is not yet approved or cleared by the [place] FDA and has been authorized for detection and/or diagnosis of SARS-CoV-2 by FDA under an Emergency Use Authorization (EUA). This EUA will remain in effect (meaning this test can be used) for the duration of the COVID-19 declaration under Section 564(b)(1) of the Act, 21 U.S.C. section 360bbb-3(b)(1), unless the authorization is terminated or revoked.  Performed at [facility], [address] [place], [place] [address]   Culture, blood (routine x 2)     Status: None   Collection Time: 03/08/21  1:07 AM   Specimen: BLOOD  Result Value Ref Range Status   Specimen Description BLOOD  RIGHT HAND  Final   Special Requests   Final    BOTTLES DRAWN AEROBIC AND ANAEROBIC Blood Culture results may not be optimal due to an inadequate volume of blood received in culture bottles   Culture   Final    NO GROWTH 5 DAYS Performed at [facility], [address] [place], [place] [address]    Report Status 03/13/2021 FINAL  Final  Urine Culture     Status: Abnormal   Collection Time: 03/08/21  1:17 AM   Specimen: Urine, Clean Catch  Result Value Ref Range Status   Specimen Description   Final    URINE, CLEAN CATCH Performed at [facility], [address], [place], [place] [address]    Special Requests   Final    NONE Performed at [facility], [address], [place], [place] [address]    Culture (A)  Final    20,000 COLONIES/mL STREPTOCOCCUS AGALACTIAE TESTING AGAINST S. AGALACTIAE NOT ROUTINELY PERFORMED DUE TO PREDICTABILITY OF AMP/PEN/VAN SUSCEPTIBILITY. Performed at [facility], [address], [place], [place] [address]    Report Status 03/09/2021 FINAL  Final  Culture, blood (routine x 2)     Status: None   Collection Time: 03/08/21  2:02 AM   Specimen: BLOOD  Result Value Ref Range Status  Specimen Description BLOOD RIGHT HAND  Final   Special Requests   Final    BOTTLES DRAWN AEROBIC AND ANAEROBIC Blood Culture adequate volume   Culture   Final    NO GROWTH 5 DAYS Performed at [facility], [address] [place], [place] [address]    Report Status 03/13/2021 FINAL  Final    Coagulation Studies: No results for input(s): LABPROT, INR in the last 72 hours.  Urinalysis: No results for input(s): COLORURINE, LABSPEC, PHURINE, GLUCOSEU, HGBUR, BILIRUBINUR, KETONESUR, PROTEINUR, UROBILINOGEN, NITRITE, LEUKOCYTESUR in the last 72 hours.  Invalid input(s): APPERANCEUR    Imaging: PERIPHERAL VASCULAR CATHETERIZATION  Result Date: 05/26/2021 See surgical note for result.    Medications:     [START ON  05/28/2021] amiodarone  200 mg Oral BID   amiodarone  400 mg Oral BID   carvedilol  6.25 mg Oral BID WC   Chlorhexidine Gluconate Cloth  6 each Topical Q0600   docusate sodium  200 mg Oral BID   feeding supplement (NEPRO CARB STEADY)  237 mL Oral TID   furosemide  80 mg Oral Daily   heparin sodium (porcine)       hydrocortisone cream   Topical BID   polyethylene glycol  17 g Oral Daily   QUEtiapine  100 mg Oral QHS   rosuvastatin  10 mg Oral Daily   acetaminophen **OR** acetaminophen, bisacodyl, HYDROmorphone (DILAUDID) injection, loperamide, melatonin, ondansetron **OR** ondansetron (ZOFRAN) IV, oxyCODONE-acetaminophen  Assessment/ Plan:  Mr. SOHAIL MILDER is a 81 y.o.  male with a past medical history of hypertension, coronary artery disease, congestive heart failure, atrial fibrillation, acute kidney injury on top of chronic kidney disease now initiated on dialysis.  Principal Problem:   Acute systolic CHF (congestive heart failure) (HCC) Active Problems:   CAD (coronary artery disease)   H/O paroxysmal supraventricular tachycardia s/p ablation   Atrial fibrillation (HCC)   Controlled type 2 diabetes mellitus without complication, without long-term current use of insulin (HCC)   Acute renal failure superimposed on stage 3b chronic kidney disease (HCC)   Chronic anemia   Chronic anticoagulation   Hypoglycemia   Memory deficit   Acute HFrEF (heart failure with reduced ejection fraction) (HCC)   ESRD (end stage renal disease) (HCC)     End Stage Renal Disease on hemodialysis: Dialysis today with fluid removal as tolerated by  No results found for: POTASSIUM  Intake/Output Summary (Last 24 hours) at 05/27/2021 1250 Last data filed at 05/26/2021 2211 Gross per 24 hour  Intake 71.83 ml  Output 600 ml  Net -528.17 ml    2. Hypertension with chronic kidney disease: Continue present antihypertensive medications.   BP (!) 150/92   Pulse 65   Temp (!) 97.2 F (36.2 C)  (Axillary)   Resp (!) 21   Ht 5\' 8"  (1.727 m)   Wt 89.7 kg   SpO2 97%   BMI 30.07 kg/m   3. Anemia of chronic kidney disease/ kidney injury/chronic disease/acute blood loss:   Lab Results  Component Value Date   HGB 10.0 (L) 05/27/2021  We will follow anemia protocols.  4. Secondary Hyperparathyroidism:     Lab Results  Component Value Date   CALCIUM 8.5 (L) 05/27/2021   We will check PTH and phosphorus levels.  #5: Dialysis access: Patient had right IJ PermCath placement.  Patient is likely to go on PD.  He will see the vascular surgeon for PD catheter placement in the near future.  Other medications  reviewed and acceptable.    LOS: 14 Lorain Childes, MD [facility] 4/29/202312:50 PM

## 2021-05-27 NOTE — Progress Notes (Signed)
?  Progress Note ? ? ?PatientMarland Kitchen Timothy Riley Timothy Riley YJE:[id number] DOB: July 08, 1940 DOA: 05/13/2021     14 ?DOS: the patient was seen and examined on 05/27/2021 ?  ?Brief hospital course: ?No notes on file ? ?Assessment and Plan: ?* Acute systolic CHF (congestive heart failure) (HCC) ?--current Echo showed worsening LVEF to 30% ?--received IV Lasix with worsening renal function, now started on dialysis ?--cardio consulted, now signed off ?Plan: ?Continue carvedilol '25mg'$  ?--dialysis for fluid removal ?--outpatient cardio f/u 1-2 weeks after discharge ? ?Acute renal failure superimposed on stage 3b chronic kidney disease ([place]) ?Likely related to ongoing diuretic therapy ?--started on dialysis ?Plan: ?--HD per nephro ? ?Chronic anticoagulation ?--Eliquis held for PermCath placement, resume after ? ?Atrial fibrillation ([place]) ?--cont carvedilol ?--complete 10 gm amiodarone load and then transition to 200 mg daily. ?--resume Eliquis ?--outpatient cardio f/u 1-2 weeks after discharge ? ? ? ?ESRD (end stage renal disease) ([place]) ?Started on dialysis ?--PermCath placement on 4/28 ?--setting up for outpatient dialysis ? ?Chronic anemia ?At baseline ? ?Controlled type 2 diabetes mellitus without complication, without long-term current use of insulin ([place]) ?BG within goal ?--d/c'ed BG checks and SSI ? ?H/O paroxysmal supraventricular tachycardia s/p ablation ?Continuous cardiac monitoring with no acute issues suspected ? ?CAD (coronary artery disease) ?Appears stable.  Patient without complaints of chest pain and first troponin 20 ?Continue rosuvastatin and carvedilol ? ? ? ? ?  ? ?Subjective:  ?Due to complaint of persistent insomnia, nightly seroquel was increased, which led to severe lethargy this morning.  Pt did say, he slept very well! ? ? ?Physical Exam: ? ?Constitutional: NAD, AAOx3, in dialysis ?HEENT: conjunctivae and lids normal, EOMI ?CV: No cyanosis.   ?RESP: normal respiratory effort, on RA ?Neuro: II - XII grossly intact.    ?Psych: Normal mood and affect.  Appropriate judgement and reason ? ? ? ?Data Reviewed: ? ?Family Communication:  ? ?Disposition: ?Status is: Inpatient ? ? Planned Discharge Destination: Home ? ? ? ?Time spent: 35 minutes ? ?Author: ?Enzo Bi, MD ?05/27/2021 6:30 PM ? ?For on call review [email or web address].  ?

## 2021-05-28 DIAGNOSIS — I5021 Acute systolic (congestive) heart failure: Secondary | ICD-10-CM | POA: Diagnosis not present

## 2021-05-28 LAB — BASIC METABOLIC PANEL
Anion gap: 11 (ref 5–15)
BUN: 26 mg/dL — ABNORMAL HIGH (ref 8–23)
CO2: 28 mmol/L (ref 22–32)
Calcium: 8.6 mg/dL — ABNORMAL LOW (ref 8.9–10.3)
Chloride: 101 mmol/L (ref 98–111)
Creatinine, Ser: 1.82 mg/dL — ABNORMAL HIGH (ref 0.61–1.24)
GFR, Estimated: 37 mL/min — ABNORMAL LOW (ref >60)
Glucose, Bld: 150 mg/dL — ABNORMAL HIGH (ref 70–99)
Potassium: 3.6 mmol/L (ref 3.5–5.1)
Sodium: 140 mmol/L (ref 135–145)

## 2021-05-28 LAB — CBC
HCT: 34.1 % — ABNORMAL LOW (ref 39.0–52.0)
Hemoglobin: 10.7 g/dL — ABNORMAL LOW (ref 13.0–17.0)
MCH: 27.9 pg (ref 26.0–34.0)
MCHC: 31.4 g/dL (ref 30.0–36.0)
MCV: 88.8 fL (ref 80.0–100.0)
Platelets: 154 10*3/uL (ref 150–400)
RBC: 3.84 MIL/uL — ABNORMAL LOW (ref 4.22–5.81)
RDW: 17.7 % — ABNORMAL HIGH (ref 11.5–15.5)
WBC: 6.9 10*3/uL (ref 4.0–10.5)
nRBC: 0 % (ref 0.0–0.2)

## 2021-05-28 LAB — MAGNESIUM: Magnesium: 2 mg/dL (ref 1.7–2.4)

## 2021-05-28 MED ORDER — CARVEDILOL 6.25 MG PO TABS: 6.2500 mg | ORAL_TABLET | Freq: Two times a day (BID) | ORAL | 2 refills | Status: DC

## 2021-05-28 MED ORDER — FUROSEMIDE 80 MG PO TABS
80.0000 mg | ORAL_TABLET | Freq: Every day | ORAL | 2 refills | Status: DC
Start: 2021-05-28 — End: 2021-08-24

## 2021-05-28 MED ORDER — GLIPIZIDE 10 MG PO TABS: 10.0000 mg | ORAL_TABLET | Freq: Every day | ORAL | Status: DC

## 2021-05-28 MED ORDER — AMIODARONE HCL 200 MG PO TABS: 200.0000 mg | ORAL_TABLET | Freq: Every day | ORAL | 2 refills | Status: DC

## 2021-05-28 NOTE — Progress Notes (Signed)
?  Progress Note ? ? ?PatientMarland Kitchen Lloyd Ayo [facility] Riley DOB: 05/24/1940 DOA: 05/13/2021     15 ?DOS: the patient was seen and examined on 05/28/2021 ?  ?Brief hospital course: ?No notes on file ? ?Assessment and Plan: ?* Acute systolic CHF (congestive heart failure) (HCC) ?--current Echo showed worsening LVEF to 30% ?--received IV Lasix with worsening renal function, now started on dialysis ?--cardio consulted, now signed off ?Plan: ?Continue carvedilol '25mg'$  ?--dialysis for fluid removal ?--outpatient cardio f/u 1-2 weeks after discharge ? ?Acute renal failure superimposed on stage 3b chronic kidney disease ([place]) ?Likely related to ongoing diuretic therapy ?--started on dialysis ?Plan: ?--HD per nephro ? ?Chronic anticoagulation ?--cont Eliquis ? ?Atrial fibrillation ([place]) ?--cont carvedilol ?--complete 10 gm amiodarone load and then transition to 200 mg daily. ?--cont Eliquis ?--outpatient cardio f/u 1-2 weeks after discharge ? ? ? ?ESRD (end stage renal disease) ([place]) ?Started on dialysis ?--PermCath placement on 4/28.  Not a good candidate for PD. ?--already set up for outpatient dialysis ? ?Chronic anemia ?At baseline ? ?Controlled type 2 diabetes mellitus without complication, without long-term current use of insulin ([place]) ?BG within goal ?--d/c'ed BG checks and SSI ? ?H/O paroxysmal supraventricular tachycardia s/p ablation ?Continuous cardiac monitoring with no acute issues suspected ? ?CAD (coronary artery disease) ?Appears stable.  Patient without complaints of chest pain and first troponin 20 ?Continue rosuvastatin and carvedilol ? ? ? ? ?  ? ?Subjective:  ?Pt was going to the bathroom on his own this morning without needing assistance.   ? ?Medically ready for discharge, however, could not get into his house because son is currently out of town for work and won't be back until Monday. ? ? ?Physical Exam: ? ?Constitutional: NAD, AAOx3 ?HEENT: conjunctivae and lids normal, EOMI ?CV: No cyanosis.   ?RESP:  normal respiratory effort, on RA ?Extremities: No effusions, edema in BLE ?SKIN: warm, dry ?Neuro: II - XII grossly intact.   ?Psych: Normal mood and affect.  Appropriate judgement and reason ? ? ? ?Data Reviewed: ? ?Family Communication:  ? ?Disposition: ?Status is: Inpatient ? ? Planned Discharge Destination: Home ? ? ? ?Time spent: 35 minutes ? ?Author: ?Enzo Bi, MD ?05/28/2021 7:11 PM ? ?For on call review [email or web address].  ?

## 2021-05-28 NOTE — Progress Notes (Signed)
Saw order on IV team worklist to d/c CVC. Informed pt's RN that she could do that.She said OK. ?

## 2021-05-28 NOTE — Progress Notes (Addendum)
Physical Therapy Treatment ?Patient Details ?Name: Timothy Riley [facility] ?MRN: [id number] ?DOB: 07-30-1940 ?Today's Date: 05/28/2021 ? ? ?History of Present Illness Hutch Rhett is an 57yoM admitted for acute on chronic heart failure and is currently s/p cardioversion. Medical history significant for Persistent A-fib with prior cardioversion, on Eliquis, s/p bioprosthetic AVR, G2 DD (EF 50 to 55% 08/2020), diabetes, nonobstructive CAD with low risk Myoview in 2020, HTN, CKD 3B,hospitalized from 2/8 to 03/15/2021 with acute gastroenteritis and CHF exacerbation  complicated by AKI related to diuretic therapy who presents to the ED  with a complaint of shortness of breath, abdominal distention and bilateral lower extremity edema that has not been responding to outpatient therapy. Pt underwent temporary left IJ HD cath removal and Rt tunneled IJ cath placement on 4/28 c vascular surgery. ? ?  ?PT Comments  ? ? Pt in bed upon entry, awake, expresses frustration regarding confusion and apparent changes to DC planning and now his son not being in town for DC today. Pt also has questions regarding his HD access. Pt agreeable to session, AMB tolerance still progressing gradually, but not as robust as seen prior to starting HD treatments a few days ago. Pt is "woozie" with positional changes to gravity orientation and has orthostatic BP as well. Pt set up for breakfast at end of session. Author made attempts to contact additional family regarding 1 assist for DC today- left a hipaa compliant VM. Placed DME orders today. Son who is primary support for patient is out of town today until tomorrow as he was told previously that DC would take place on Monday.  ? ? ?Orthostatic Vital signs             ?Supine   Sitting- 0 min  Sitting- 1 min Standing- 0 min Standing- 1 min Standing- s/p AMB  ?BP (mmHg)  HR (BPM)  BP (mmHg)  HR (BPM)  BP (mmHg)  HR (BPM)  BP (mmHg)  HR (BPM)  BP (mmHg)  HR (BPM)  BP (mmHg)  HR (BPM)   ?161/84 65 141/82 65  159/78 65 160/72 65 154/75 63 143/74 67  ? ? ?  ?Recommendations for follow up therapy are one component of a multi-disciplinary discharge planning process, led by the attending physician.  Recommendations may be updated based on patient status, additional functional criteria and insurance authorization. ? ?Follow Up Recommendations ? Home health PT ?  ?  ?Assistance Recommended at Discharge Intermittent Supervision/Assistance  ?Patient can return home with the following A little help with walking and/or transfers;A little help with bathing/dressing/bathroom;Assistance with cooking/housework;Assistance with feeding;Direct supervision/assist for medications management;Direct supervision/assist for financial management;Assist for transportation;Help with stairs or ramp for entrance ?  ?Equipment Recommendations ? Rolling walker (2 wheels);BSC/3in1  ?  ?Recommendations for Other Services   ? ? ?  ?Precautions / Restrictions Precautions ?Precautions: Fall ?Restrictions ?Weight Bearing Restrictions: No  ?  ? ?Mobility ? Bed Mobility ?Overal bed mobility: Needs Assistance ?Bed Mobility: Supine to Sit ?  ?  ?Supine to sit: Min assist ?  ?  ?General bed mobility comments: more trunk weakness than prior sessions ?  ? ?Transfers ?Overall transfer level: Needs assistance ?Equipment used: None ?Transfers: Sit to/from Stand ?Sit to Stand: Supervision ?  ?  ?  ?  ?  ?General transfer comment: obvious effort, but steady once standing ?  ? ?Ambulation/Gait ?Ambulation/Gait assistance: Supervision ?Gait Distance (Feet): 230 Feet ?Assistive device: Rolling walker (2 wheels) ?Gait Pattern/deviations: Step-through pattern ?Gait velocity: 0.23ms ?  ?  ?  General Gait Details: 2-point gait patterns, appears more weak than previous sessions, legs appeara weak,pt reports heavy. ? ? ?Stairs ?  ?  ?  ?  ?  ? ? ?Wheelchair Mobility ?  ? ?Modified Rankin (Stroke Patients Only) ?  ? ? ?  ?Balance   ?  ?  ?  ?  ?  ?  ?  ?  ?  ?  ?  ?  ?  ?  ?  ?   ?  ?  ?  ? ?  ?Cognition   ?  ?  ?  ?  ?  ?  ?  ?  ?  ?  ?  ?  ?  ?  ?  ?  ?  ?  ?  ?  ?  ? ?  ?Exercises   ? ?  ?General Comments   ?  ?  ? ?Pertinent Vitals/Pain Pain Assessment ?Pain Assessment: No/denies pain ?Pain Intervention(s): Limited activity within patient's tolerance  ? ? ?Home Living Family/patient expects to be discharged to:: Private residence ?Living Arrangements: Children (Son's house) ?Available Help at Discharge: Family;Available PRN/intermittently ?Type of Home: House ?Home Access: Stairs to enter ?Entrance Stairs-Rails: None ?Entrance Stairs-Number of Steps: 3 ?  ?Home Layout: One level ?Home Equipment: None ?Additional Comments: Pt moving next week into a new home, fully handicap accessible, built next door to his son's home  ?  ?Prior Function    ?  ?  ?   ? ?PT Goals (current goals can now be found in the care plan section) Acute Rehab PT Goals ?Patient Stated Goal: go home tomorrow ?PT Goal Formulation: With patient ?Time For Goal Achievement: 05/31/21 ?Potential to Achieve Goals: Good ?Progress towards PT goals: Progressing toward goals ? ?  ?Frequency ? ? ? Min 2X/week ? ? ? ?  ?PT Plan Current plan remains appropriate  ? ? ?Co-evaluation   ?  ?  ?  ?  ? ?  ?AM-PAC PT "6 Clicks" Mobility   ?Outcome Measure ? Help needed turning from your back to your side while in a flat bed without using bedrails?: A Lot ?Help needed moving from lying on your back to sitting on the side of a flat bed without using bedrails?: A Lot ?Help needed moving to and from a bed to a chair (including a wheelchair)?: A Little ?Help needed standing up from a chair using your arms (e.g., wheelchair or bedside chair)?: A Little ?Help needed to walk in hospital room?: A Little ?Help needed climbing 3-5 steps with a railing? : A Lot ?6 Click Score: 15 ? ?  ?End of Session   ?Activity Tolerance: Patient tolerated treatment well;Patient limited by fatigue;Treatment limited secondary to medical complications  (Comment) ?Patient left: in bed;with bed alarm set (EOB with breakfast tray presented) ?Nurse Communication: Mobility status ?PT Visit Diagnosis: Unsteadiness on feet (R26.81);Muscle weakness (generalized) (M62.81);Difficulty in walking, not elsewhere classified (R26.2) ?  ? ? ?Time: 0865-7846 ?PT Time Calculation (min) (ACUTE ONLY): 33 min ? ?Charges:  $Therapeutic Exercise: 23-37 mins          ?          ?10:17 AM, 05/28/21 ?Etta Grandchild, PT, DPT ?Physical Therapist - [place] ?[facility]  ?[phone] (ASCOM)  ? ? ?Alyrica Thurow C ?05/28/2021, 10:17 AM ? ?

## 2021-05-28 NOTE — Progress Notes (Signed)
Mobility Specialist - Progress Note ? ? 05/28/21 1635  ?Mobility  ?Activity Ambulated independently in hallway  ?Level of Assistance Standby assist, set-up cues, supervision of patient - no hands on  ?Assistive Device Front wheel walker  ?Distance Ambulated (ft) 200 ft  ?Activity Response Tolerated well  ?$Mobility charge 1 Mobility  ? ? ? ?Pt ambulates 267f in hallway using RA voicing no complaints and returned EOB with needs in reach and NT present. ? ?MMerrily Brittle?Mobility Specialist ?05/28/21, 4:40 PM ? ? ? ? ?

## 2021-05-28 NOTE — TOC Transition Note (Addendum)
Transition of Care (TOC) - CM/SW Discharge Note ? ? ?Patient Details  ?Name: Timothy Riley [facility] ?MRN: [id number] ?Date of Birth: Jun 15, 1940 ? ?Transition of Care (TOC) CM/SW Contact:  ?Izola Price, RN ?Phone Number: ?05/28/2021, 9:27 AM ? ? ?Clinical Narrative: 4/30: Patient has discharge orders to home/with home health services arranged via [facility] by RN CM on 4/28. DME order requested via provider for RW and 3:1 recommended by PT notes of 05/27/21. [facility] notified of DME orders.  ? ?Patient ambulated with PT with min guard assist with walker at 60 feet.  ? ?Patient's son is out of town and will not be at home to receive patient today.  ? ?Attempting to find other family members as patient needs someone to transport and to stay with him first few nights per prior notes.  ? ?PT notes of 4/29 indicate patient ambulated better than day before but still noted requires frequent of constant supervision/assistance.  ? ?HD set up by HD coordinator at [facility] on [address] on Tuesday, Thursday, Saturday routinely at 1145 but first visit on 5/2 will be at 1045. Coordinator notes that son is aware of this set up/arrangement.  ? ?NOTE: Spoke by phone with out of town son, Timothy Riley, Timothy Riley [phone]. He is out of town and will return Monday during the day sometime. He states that before he left he spoke to every provider he could making sure patient was not being discharged over the weekend before he left town for work as he had been out of work for 10 days with his father's hospitalization. He was told it would be Monday or Tuesday as other issues where occurring. Patient also does not have access/key to house where he lives with son as son took all belongings home with him. There is one other son/brother who is very poor health and cannot be of any assistance. There is no other living relative per son. Patient would have to navigate 50 feet from driveway to 4 steps into house over gravel/grass driveway. He is  adamant that he tried to plan for this before he left and that this would not be safe for his father to be alone even if he could get home. He is aware of HD appointment Tuesday May 2nd and why he is returning Monday from out of town work.  ?Summary of conversation:  ?No transportation on discharge ?No access/key to house if transportation found. ?Unsafe/uneven 50 feet to doorway of house over gravel. ?4 steps into house with no railing.  ?Not safe alone per son's adamant concerns per phone call.   Simmie Davies RN CM [phone].  ? ?Final next level of care: [place] ?  ? ? ?Patient Goals and CMS Choice ?  ?  ?  ? ?Discharge Placement ?  ?           ?  ?  ?  ?  ? ?Discharge Plan and Services ?  ?  ?           ?DME Arranged: 3-N-1, Walker rolling ?DME Agency: [facility] ?Date DME Agency Contacted: 05/28/21 ?Time DME Agency Contacted: [phone] ?  ?HH Arranged: PT ?[place] Agency: [facility]) ?Date HH Agency Contacted: 05/26/21 ?  ?Representative spoke with at [place]: Floydene Flock per RN CM note Alben Deeds, RN ? ?Social Determinants of Health (SDOH) Interventions ?  ? ? ?Readmission Risk Interventions ?   ? View : No data to display.  ?  ?  ?  ? ? ? ? ? ?

## 2021-05-28 NOTE — Progress Notes (Signed)
?Timothy Riley  ?PROGRESS NOTE  ? ?Subjective:  ? ?Out of bed to chair.  Feels much better. ?Appetite is good.  Had stable dialysis yesterday. ? ?Objective:  ?Vital signs: ?Blood pressure 136/66, pulse (!) 59, temperature 98.2 ?F (36.8 ?C), resp. rate 16, height '5\' 8"'$  (1.727 m), weight 84.1 kg, SpO2 91 %. ? ?Intake/Output Summary (Last 24 hours) at 05/28/2021 1253 ?Last data filed at 05/28/2021 1250 ?Gross per 24 hour  ?Intake 0 ml  ?Output 2600 ml  ?Net -2600 ml  ? ?Filed Weights  ? 05/27/21 1122 05/27/21 1438 05/28/21 0500  ?Weight: 89.7 kg 87.7 kg 84.1 kg  ? ? ? ?Physical Exam: ?General:  No Timothy distress  ?Head:  Normocephalic, atraumatic. Moist oral mucosal membranes  ?Eyes:  Anicteric  ?Neck:  Supple  ?Lungs:   Clear to auscultation, normal effort  ?Heart:  S1S2 no rubs  ?Abdomen:   Soft, nontender, bowel sounds present  ?Extremities:  peripheral edema.  ?Neurologic:  Awake, alert, following commands  ?Skin:  No lesions  ?Access:   ? ? ?Basic Metabolic Panel: ?Recent Labs  ?Lab 05/24/21 ?6759 05/25/21 ?1638 05/26/21 ?4665 05/27/21 ?9935 05/28/21 ?7017  ?NA 135 138 137 139 140  ?K 3.7 3.7 3.9 3.9 3.6  ?CL 102 101 101 101 101  ?CO2 '28 29 27 31 28  '$ ?GLUCOSE 228* 127* 151* 180* 150*  ?BUN 66* 46* 31* 38* 26*  ?CREATININE 2.83* 2.17* 1.87* 2.23* 1.82*  ?CALCIUM 7.9* 8.1* 8.3* 8.5* 8.6*  ?MG  --  2.4 2.3 2.3 2.0  ? ? ?CBC: ?Recent Labs  ?Lab 05/24/21 ?7939 05/25/21 ?0300 05/26/21 ?9233 05/27/21 ?0076 05/28/21 ?2263  ?WBC 6.1 5.3 6.2 5.6 6.9  ?HGB 10.0* 10.1* 10.7* 10.0* 10.7*  ?HCT 32.6* 32.7* 34.7* 32.2* 34.1*  ?MCV 89.3 89.8 90.8 89.9 88.8  ?PLT 155 145* 149* 131* 154  ? ? ? ?Urinalysis: ?No results for input(s): COLORURINE, LABSPEC, [place], GLUCOSEU, HGBUR, BILIRUBINUR, KETONESUR, PROTEINUR, UROBILINOGEN, NITRITE, LEUKOCYTESUR in the last 72 hours. ? ?Invalid input(s): APPERANCEUR  ? ? ?Imaging: ?PERIPHERAL VASCULAR CATHETERIZATION ? ?Result Date: 05/26/2021 ?See surgical note for result.   ? ? ?Medications:  ? ? ? amiodarone  200 mg Oral BID  ? apixaban  2.5 mg Oral BID  ? carvedilol  6.25 mg Oral BID WC  ? Chlorhexidine Gluconate Cloth  6 each Topical Q0600  ? docusate sodium  200 mg Oral BID  ? feeding supplement (NEPRO CARB STEADY)  237 mL Oral TID  ? furosemide  80 mg Oral Daily  ? hydrocortisone cream   Topical BID  ? polyethylene glycol  17 g Oral Daily  ? rosuvastatin  10 mg Oral Daily  ? ? ?Assessment/ Plan:  ?   ?Timothy Riley is a 81 y.o.  male with a past medical history of hypertension, coronary artery Riley, congestive heart failure, atrial fibrillation, Timothy Riley on top of Timothy Riley now initiated on dialysis. ? ? ?Principal Problem: ?  Timothy systolic CHF (congestive heart failure) ([place]) ?Active Problems: ?  CAD (coronary artery Riley) ?  H/O paroxysmal supraventricular tachycardia s/p ablation ?  Atrial fibrillation ([place]) ?  Controlled type 2 diabetes mellitus without complication, without long-term current use of insulin ([place]) ?  Timothy renal failure superimposed on stage 3b Timothy Riley ([place]) ?  Timothy anemia ?  Timothy anticoagulation ?  Hypoglycemia ?  Memory deficit ?  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  ESRD (end  stage renal Riley) ([place]) ? ?#1: End-stage renal Riley: Patient is now dialysis dependent.  Had stable dialysis yesterday via the right permacath.  Outpatient dialysis is being arranged ? ?#2: Congestive heart failure: CHF has improved significantly with dialysis.  Continue the furosemide at 80 mg daily.  I advised him on the importance of strict fluid restriction. ? ?#3: Anemia: Anemia is most likely secondary to Timothy Riley.  We will continue the anemia protocols on dialysis. ? ?#4: Secondary hyperparathyroidism: Continue to monitor calcium, phosphorus and PTH. ? ? LOS: 15 ?Timothy Riley, Timothy Riley ?[name] ?4/30/202312:53 PM ?  ?

## 2021-05-29 ENCOUNTER — Encounter: Payer: Self-pay | Admitting: Vascular Surgery

## 2021-05-29 NOTE — Progress Notes (Signed)
Physical Therapy Treatment ?Patient Details ?Name: Timothy Riley [facility] ?MRN: [id number] ?DOB: 1940/05/09 ?Today's Date: 05/29/2021 ? ? ?History of Present Illness Timothy Riley is an 30yoM admitted for acute on chronic heart failure and is currently s/p cardioversion. Medical history significant for Persistent A-fib with prior cardioversion, on Eliquis, s/p bioprosthetic AVR, G2 DD (EF 50 to 55% 08/2020), diabetes, nonobstructive CAD with low risk Myoview in 2020, HTN, CKD 3B,hospitalized from 2/8 to 03/15/2021 with acute gastroenteritis and CHF exacerbation  complicated by AKI related to diuretic therapy who presents to the ED  with a complaint of shortness of breath, abdominal distention and bilateral lower extremity edema that has not been responding to outpatient therapy. Pt underwent temporary left IJ HD cath removal and Rt tunneled IJ cath placement on 4/28 c vascular surgery. ? ?  ?PT Comments  ? ? Pt already awake dressed and up to chair at entry.  Continued to gradually advance AMB distance, but tolerance remains largely unchanged, suspicious for hypotension issues while up walking as observed a few days prior. Pt partakes in 2 walks with a break between, also able to repeat stairs training which reveals more acute weakness compared to last week. Pt left up in chair at end of session. Still requires a RW for asssit while AMB unlike his PLOF.  ? ?  ?Recommendations for follow up therapy are one component of a multi-disciplinary discharge planning process, led by the attending physician.  Recommendations may be updated based on patient status, additional functional criteria and insurance authorization. ? ?Follow Up Recommendations ? Home health PT ?  ?  ?Assistance Recommended at Discharge Intermittent Supervision/Assistance  ?Patient can return home with the following A little help with walking and/or transfers;A little help with bathing/dressing/bathroom;Assistance with cooking/housework;Assistance with feeding;Direct  supervision/assist for medications management;Direct supervision/assist for financial management;Assist for transportation;Help with stairs or ramp for entrance ?  ?Equipment Recommendations ? Rolling walker (2 wheels);BSC/3in1  ?  ?Recommendations for Other Services   ? ? ?  ?Precautions / Restrictions Precautions ?Precautions: Fall ?Restrictions ?Weight Bearing Restrictions: No  ?  ? ?Mobility ? Bed Mobility ?  ?  ?  ?  ?  ?  ?  ?General bed mobility comments: received in recliner ?  ? ?Transfers ?Overall transfer level: Needs assistance ?Equipment used: None ?Transfers: Sit to/from Stand ?Sit to Stand: Supervision ?  ?  ?  ?  ?  ?  ?  ? ?Ambulation/Gait ?Ambulation/Gait assistance: Min guard ?Gait Distance (Feet): 320 Feet ?Assistive device: Rolling walker (2 wheels) ?Gait Pattern/deviations: Step-through pattern ?Gait velocity: 0.68ms today (0.465m yesterday) ?  ?  ?General Gait Details: 2-point gait patterns, again continues to appear progressively more sluggish and weak once up walking, I suspect still having some hypotension issues when up AMB ? ? ?Stairs ?Stairs: Yes ?Stairs assistance: Min guard, Min assist ?Stair Management: Two rails ?Number of Stairs: 6 ?General stair comments: performance not nearly as robust as 6 day prior ? ? ?Wheelchair Mobility ?  ? ?Modified Rankin (Stroke Patients Only) ?  ? ? ?  ?Balance   ?  ?  ?  ?  ?  ?  ?  ?  ?  ?  ?  ?  ?  ?  ?  ?  ?  ?  ?  ? ?  ?Cognition Arousal/Alertness: Awake/alert ?Behavior During Therapy: [facility] tasks assessed/performed ?Overall Cognitive Status: Within Functional Limits for tasks assessed ?  ?  ?  ?  ?  ?  ?  ?  ?  ?  ?  ?  ?  ?  ?  ?  ?  ?  ?  ? ?  ?  Exercises Other Exercises ?Other Exercises: 382f AMB c RW ?Other Exercises: 1543fAMB c RW ?Other Exercises: 6 stairs with 2 railings (provided minA for final 2) ? ?  ?General Comments   ?  ?  ? ?Pertinent Vitals/Pain Pain Assessment ?Pain Assessment: No/denies pain  ? ? ?Home Living   ?  ?  ?  ?  ?   ?  ?  ?  ?  ?   ?  ?Prior Function    ?  ?  ?   ? ?PT Goals (current goals can now be found in the care plan section) Acute Rehab PT Goals ?Patient Stated Goal: go home tomorrow ?PT Goal Formulation: With patient ?Time For Goal Achievement: 05/31/21 ?Potential to Achieve Goals: Good ?Progress towards PT goals: Progressing toward goals ? ?  ?Frequency ? ? ? Min 2X/week ? ? ? ?  ?PT Plan Current plan remains appropriate  ? ? ?Co-evaluation   ?  ?  ?  ?  ? ?  ?AM-PAC PT "6 Clicks" Mobility   ?Outcome Measure ? Help needed turning from your back to your side while in a flat bed without using bedrails?: A Lot ?Help needed moving from lying on your back to sitting on the side of a flat bed without using bedrails?: A Lot ?Help needed moving to and from a bed to a chair (including a wheelchair)?: A Little ?Help needed standing up from a chair using your arms (e.g., wheelchair or bedside chair)?: A Little ?Help needed to walk in hospital room?: A Little ?Help needed climbing 3-5 steps with a railing? : A Lot ?6 Click Score: 15 ? ?  ?End of Session Equipment Utilized During Treatment: Gait belt ?Activity Tolerance: Patient tolerated treatment well;Patient limited by fatigue;Treatment limited secondary to medical complications (Comment) ?Patient left: in chair;with call bell/phone within reach ?Nurse Communication: Mobility status ?PT Visit Diagnosis: Unsteadiness on feet (R26.81);Muscle weakness (generalized) (M62.81);Difficulty in walking, not elsewhere classified (R26.2) ?  ? ? ?Time: [phone] Time Calculation (min) (ACUTE ONLY): 23 min ? ?Charges:  $Therapeutic Exercise: 23-37 mins          ?          ?11:30 AM, 05/29/21 ?AlEtta GrandchildPT, DPT ?Physical Therapist - [facility]?[phone])  ? ? ?[name] ?05/29/2021, 11:28 AM ? ?

## 2021-05-29 NOTE — Care Management Important Message (Signed)
Important Message ? ?Patient Details  ?Name: Waino Mounsey [facility] ?MRN: [id number] ?Date of Birth: 06/04/1940 ? ? ?Medicare Important Message Given:  Yes ? ? ? ? ?Juliann Pulse A Jhoel Stieg ?05/29/2021, 11:44 AM ?

## 2021-05-29 NOTE — Discharge Summary (Signed)
? ?Physician Discharge Summary ? ? ?Timothy Riley  male DOB: 07-Sep-1940  ?ZSW:[id number] ? ?PCP: Derinda Late, MD ? ?Admit date: 05/13/2021 ?Discharge date: 05/29/2021 ? ?Admitted From: home ?Disposition:  home ?Home Health: Yes ?CODE STATUS: Full code ? ? ?Hospital Course:  ?For full details, please see H&P, progress notes, consult notes and ancillary notes.  ?Briefly,  ?Timothy Riley is a 81 y.o. male with medical history significant for Persistent A-fib with prior cardioversion, on Eliquis, s/p bioprosthetic AVR, G2 DD (EF 50 to 55% 08/2020), diabetes, nonobstructive CAD with low risk Myoview in 2020, HTN, CKD 3B, hospitalized from 2/8 to 03/15/2021 with acute gastroenteritis and CHF exacerbation complicated by AKI related to diuretic therapy who presented to the ED  with a complaint of shortness of breath, abdominal distention and bilateral lower extremity edema that has not been responding to outpatient therapy.   ? ?* Acute combined CHF (congestive heart failure) ([place]) ?--current Echo showed worsening LVEF to 30% ?--received IV Lasix with worsening renal function, and started on dialysis ?--cardio consulted ?--Continue carvedilol '25mg'$  ?--dialysis for fluid removal ?--pt still makes urine, and diuretic switched to lasix 80 mg daily by nephro ?--outpatient cardio f/u 1-2 weeks after discharge ?  ?Acute renal failure superimposed on stage 3b chronic kidney disease ([place]) ?ESRD (end stage renal disease) ([place]) ?--Started on dialysis ?--PermCath placement on 4/28.  Not a good candidate for PD. ?--already set up for outpatient dialysis for TTS ?  ?Atrial fibrillation ([place]) ?Chronic anticoagulation ?--cont carvedilol ?--completed 10 gm amiodarone load and transitioned to 200 mg daily. ?--cont Eliquis ?--outpatient cardio f/u 1-2 weeks after discharge ?  ?Chronic anemia ?At baseline, Hgb 10's. ?  ?Controlled type 2 diabetes mellitus without complication, without long-term current use of insulin ([place]) ?BG within  goal ?--d/c'ed BG checks and SSI ?--discharged on home diabetic regimen as below. ?  ?H/O paroxysmal supraventricular tachycardia s/p ablation ? ?CAD (coronary artery disease) ?Appears stable.  Patient without complaints of chest pain and first troponin 20 ?Continue rosuvastatin  ?  ? ?Discharge Diagnoses:  ?Principal Problem: ?  Acute systolic CHF (congestive heart failure) ([place]) ?Active Problems: ?  Acute renal failure superimposed on stage 3b chronic kidney disease ([place]) ?  Atrial fibrillation ([place]) ?  Chronic anticoagulation ?  CAD (coronary artery disease) ?  H/O paroxysmal supraventricular tachycardia s/p ablation ?  Controlled type 2 diabetes mellitus without complication, without long-term current use of insulin ([place]) ?  Chronic anemia ?  Hypoglycemia ?  Memory deficit ?  Acute HFrEF (heart failure with reduced ejection fraction) ([place]) ?  ESRD (end stage renal disease) ([place]) ? ? ?30 Day Unplanned Readmission Risk Score   ? ?Flowsheet Row ED to Hosp-Admission (Current) from 05/13/2021 in [facility]  ?30 Day Unplanned Readmission Risk Score (%) 22.21 Filed at 05/29/2021 0400  ? ?  ? ? This score is the patient's risk of an unplanned readmission within 30 days of being discharged (0 -100%). The score is based on dignosis, age, lab data, medications, orders, and past utilization.   ?Low:  0-14.9   Medium: 15-21.9   High: 22-29.9   Extreme: 30 and above ? ?  ? ?  ? ? ?Discharge Instructions: ? ?Allergies as of 05/29/2021   ? ?   Reactions  ? Gabapentin Other (See Comments), Rash  ? dizzy ?dizzy  ? Hydralazine Anxiety, Rash  ? Hydralazine Hcl Rash  ? ?  ? ?  ?Medication List  ?  ? ?  STOP taking these medications   ? ?amoxicillin 500 MG capsule ?Commonly known as: AMOXIL ?  ?potassium chloride SA 20 MEQ tablet ?Commonly known as: KLOR-CON M ?  ?torsemide 20 MG tablet ?Commonly known as: DEMADEX ?  ? ?  ? ?TAKE these medications   ? ?amiodarone 200 MG tablet ?Commonly known as: PACERONE ?Take 1  tablet (200 mg total) by mouth daily. ?  ?carvedilol 6.25 MG tablet ?Commonly known as: COREG ?Take 1 tablet (6.25 mg total) by mouth 2 (two) times daily with a meal. ?What changed:  ?medication strength ?how much to take ?  ?doxazosin 8 MG tablet ?Commonly known as: CARDURA ?TAKE 1 TABLET BY MOUTH TWICE A DAY ?  ?Eliquis 2.5 MG Tabs tablet ?Generic drug: apixaban ?TAKE 1 TABLET BY MOUTH TWICE A DAY ?  ?furosemide 80 MG tablet ?Commonly known as: LASIX ?Take 1 tablet (80 mg total) by mouth daily. ?  ?glipiZIDE 10 MG tablet ?Commonly known as: GLUCOTROL ?Take 1 tablet (10 mg total) by mouth daily before breakfast. Home med. ?What changed: See the new instructions. ?  ?pioglitazone 30 MG tablet ?Commonly known as: ACTOS ?TAKE 1 TABLET BY MOUTH ONCE DAILY ?  ?rosuvastatin 10 MG tablet ?Commonly known as: CRESTOR ?Take 1 tablet (10 mg total) by mouth daily. ?  ?VITAMIN D3 PO ?Take by mouth daily. ?  ? ?  ? ?  ?  ? ? ?  ?Durable Medical Equipment  ?(From admission, onward)  ?  ? ? ?  ? ?  Start     Ordered  ? 05/28/21 0940  For home use only DME 3 n 1  Once       ?Comments: Patient is not able to walk the distance required to go the bathroom, or he/she is unable to safely negotiate stairs required to access the bathroom.  A 3in1 BSC will alleviate this problem  ? 05/28/21 0941  ? 05/28/21 0940  For home use only DME Walker rolling  Once       ?Question Answer Comment  ?Walker: With 5 Inch Wheels   ?Patient needs a walker to treat with the following condition Generalized weakness   ?  ? 05/28/21 0941  ? 05/28/21 0926  For home use only DME 3 n 1  Once       ? 05/28/21 0925  ? 05/26/21 2156  For home use only DME Walker rolling  Once       ?Question Answer Comment  ?Walker: With 5 Inch Wheels   ?Patient needs a walker to treat with the following condition Generalized weakness   ?  ? 05/26/21 2156  ? ?  ?  ? ?  ? ? ? Follow-up Information   ? ? Derinda Late, MD Follow up in 1 week(s).   ?Specialty: Family  Medicine ?Contact information: ?[address] ?[facility] ?[place] [address] ?[phone] ? ? ?  ?  ? ? Minna Merritts, MD Follow up in 2 week(s).   ?Specialty: Cardiology ?Contact information: ?[address] ?[place] [address] ?[phone] ? ? ?  ?  ? ?  ?  ? ?  ? ? ?Allergies  ?Allergen Reactions  ? Gabapentin Other (See Comments) and Rash  ?  dizzy ?dizzy  ? Hydralazine Anxiety and Rash  ? Hydralazine Hcl Rash  ? ? ? ?The results of significant diagnostics from this hospitalization (including imaging, microbiology, ancillary and laboratory) are listed below  for reference.  ? ?Consultations: ? ? ?Procedures/Studies: ?DG Chest 2 View ? ?Result Date: 05/13/2021 ?CLINICAL DATA:  Patient complains of fluid buildup due to CHF since February. EXAM: CHEST - 2 VIEW COMPARISON:  March 09, 2021 FINDINGS: Multiple fractured sternal wires are seen. The cardiac silhouette is mildly enlarged and unchanged in size. Mild, chronic appearing increased lung markings are seen with mild atelectasis noted within the right lung base. There is no evidence of pleural effusion or pneumothorax. The visualized skeletal structures are unremarkable. IMPRESSION: 1. Chronic appearing increased lung markings with mild right basilar atelectasis. 2. Stable cardiomegaly with evidence of prior median sternotomy. Electronically Signed   By: Virgina Norfolk M.D.   On: 05/13/2021 21:18  ? ?US RENAL ? ?Result Date: 05/19/2021 ?CLINICAL DATA:  Chronic kidney disease. Worsening creatinine. Decreased urine output. EXAM: RENAL / URINARY TRACT ULTRASOUND COMPLETE COMPARISON:  CT abdomen and pelvis 03/07/2021; ultrasound renal 01/26/2019 FINDINGS: Right Kidney: Renal measurements: 11.0 x 5.1 x 6.4 cm = volume: 187 mL. Echogenicity within normal limits. There is an avascular hypoechoic cyst measuring up to 1.6 cm, measured up to 1.9 cm on 01/26/2019 ultrasound and demonstrating fluid density  on 03/07/2021 CT. No hydronephrosis. Left Kidney: Renal measurements: 10.9 x 4.9 x 4.1 cm = volume: 115 mL mL. Echogenicity within normal limits. No mass or hydronephrosis visualized. Bladder: Only minimally distended, limi

## 2021-05-29 NOTE — TOC Transition Note (Addendum)
Transition of Care (TOC) - CM/SW Discharge Note ? ? ?Patient Details  ?Name: Mikaeel Petrow [facility] ?MRN: [id number] ?Date of Birth: 04-05-1940 ? ?Transition of Care (TOC) CM/SW Contact:  ?Laurena Slimmer, RN ?Phone Number: ?05/29/2021, 1:36 PM ? ? ?Clinical Narrative:    ?Contacted [employer] for RW to be sent home. Attempt to contact son, [name]. Left a message advising RW will be delivered to his home. TOC signing off ? ? ?Final next level of care: [place] ?  ? ? ?Patient Goals and CMS Choice ?  ?  ?  ? ?Discharge Placement ?  ?           ?  ?  ?  ?  ? ?Discharge Plan and Services ?  ?  ?           ?DME Arranged: 3-N-1, Walker rolling ?DME Agency: [employer] ?Date DME Agency Contacted: 05/28/21 ?Time DME Agency Contacted: [phone] ?  ?HH Arranged: PT ?[place] Agency: [facility] ?Date HH Agency Contacted: 05/26/21 ?  ?Representative spoke with at [place]: Floydene Flock per RN CM note Alben Deeds, RN ? ?Social Determinants of Health (SDOH) Interventions ?  ? ? ?Readmission Risk Interventions ?   ? View : No data to display.  ?  ?  ?  ? ? ? ? ? ?

## 2021-05-29 NOTE — Progress Notes (Signed)
?[facility]  ?ROUNDING NOTE  ? ?Subjective:  ? ?Timothy Riley is a 81 year old male with past medical history including CAD, hypertension, A-fib with prior cardioversion on Eliquis, and CKD 3B.  Patient presents to the emergency room with complaints of shortness of breath and increased lower extremity edema.  Patient has been admitted for CHF exacerbation ([place]) [I50.9] ?Hypervolemia, unspecified hypervolemia type [E87.70] ? ?Patient is known to our practice and was followed by Dr. Candiss Norse.  Patient currently states he does not currently follow a nephrologist due to continued decline of renal function.  Patient states that diuresis is managed by cardiologist, Dr. Rockey Situ.  ? ?Update ?Patient sitting up in chair, fully dressed ?Appears fatigued and short of breath ?States he just dressed himself ?Reports ready for discharge and awaiting son  ?Continues to void, 829m recorded in previous 24 hours ?Remains on room air ? ?Objective:  ?Vital signs in last 24 hours:  ?Temp:  [97.5 ?F (36.4 ?C)-98.1 ?F (36.7 ?C)] 98.1 ?F (36.7 ?C) (05/01 1127) ?Pulse Rate:  [57-67] 64 (05/01 1127) ?Resp:  [16-18] 16 (05/01 1127) ?BP: (138-160)/(67-92) 153/79 (05/01 1127) ?SpO2:  [96 %-100 %] 97 % (05/01 1127) ? ?Weight change:  ?Filed Weights  ? 05/27/21 1122 05/27/21 1438 05/28/21 0500  ?Weight: 89.7 kg 87.7 kg 84.1 kg  ? ? ?Intake/Output: ?I/O last 3 completed shifts: ?In: 480 [P.O.:480] ?Out: 1175 [Urine:1175] ?  ?Intake/Output this shift: ? Total I/O ?In: 240 [P.O.:240] ?Out: -  ? ?Physical Exam: ?General: NAD  ?Head: Normocephalic, atraumatic. Moist oral mucosal membranes  ?Eyes: Anicteric  ?Lungs:  Clear to auscultation, normal effort  ?Heart: Regular rate and rhythm  ?Abdomen:  Soft, nontender, distended  ?Extremities: 2+ peripheral edema. Abdominal wall edema  ?Neurologic: Nonfocal, moving all four extremities  ?Skin: No lesions  ?Access: Right PermCath  ? ? ?Basic Metabolic Panel: ?Recent Labs  ?Lab 05/24/21 ?01275 05/25/21 ?0170004/28/23 ?0174904/29/23 ?0449604/30/23 ?07591 ?NA 135 138 137 139 140  ?K 3.7 3.7 3.9 3.9 3.6  ?CL 102 101 101 101 101  ?CO2 '28 29 27 31 28  '$ ?GLUCOSE 228* 127* 151* 180* 150*  ?BUN 66* 46* 31* 38* 26*  ?CREATININE 2.83* 2.17* 1.87* 2.23* 1.82*  ?CALCIUM 7.9* 8.1* 8.3* 8.5* 8.6*  ?MG  --  2.4 2.3 2.3 2.0  ? ? ? ?Liver Function Tests: ?No results for input(s): AST, ALT, ALKPHOS, BILITOT, PROT, ALBUMIN in the last 168 hours. ? ?No results for input(s): LIPASE, AMYLASE in the last 168 hours. ?No results for input(s): AMMONIA in the last 168 hours. ? ?CBC: ?Recent Labs  ?Lab 05/24/21 ?0638404/27/23 ?0665904/28/23 ?0935704/29/23 ?0017704/30/23 ?09390 ?WBC 6.1 5.3 6.2 5.6 6.9  ?HGB 10.0* 10.1* 10.7* 10.0* 10.7*  ?HCT 32.6* 32.7* 34.7* 32.2* 34.1*  ?MCV 89.3 89.8 90.8 89.9 88.8  ?PLT 155 145* 149* 131* 154  ? ? ? ?Cardiac Enzymes: ?No results for input(s): CKTOTAL, CKMB, CKMBINDEX, TROPONINI in the last 168 hours. ? ?BNP: ?Invalid input(s): POCBNP ? ?CBG: ?Recent Labs  ?Lab 05/25/21 ?0801 05/25/21 ?1613 05/26/21 ?0801 05/26/21 ?1204 05/26/21 ?1511  ?GLUCAP 128* 152* 152* 143* 146*  ? ? ? ?Microbiology: ?Results for orders placed or performed during the hospital encounter of 03/07/21  ?Resp Panel by RT-PCR (Flu A&B, Covid) Nasopharyngeal Swab     Status: None  ? Collection Time: 03/07/21 11:21 PM  ? Specimen: Nasopharyngeal Swab; Nasopharyngeal(NP) swabs in vial transport medium  ?Result Value Ref Range Status  ?  SARS Coronavirus 2 by RT PCR NEGATIVE NEGATIVE Final  ?  Comment: (NOTE) ?SARS-CoV-2 target nucleic acids are NOT DETECTED. ? ?The SARS-CoV-2 RNA is generally detectable in upper respiratory ?specimens during the acute phase of infection. The lowest ?concentration of SARS-CoV-2 viral copies this assay can detect is ?138 copies/mL. A negative result does not preclude SARS-Cov-2 ?infection and should not be used as the sole basis for treatment or ?other patient management decisions. A negative result  may occur with  ?improper specimen collection/handling, submission of specimen other ?than nasopharyngeal swab, presence of viral mutation(s) within the ?areas targeted by this assay, and inadequate number of viral ?copies(<138 copies/mL). A negative result must be combined with ?clinical observations, patient history, and epidemiological ?information. The expected result is Negative. ? ?Fact Sheet for Patients:  ?EntrepreneurPulse.com.au ? ?Fact Sheet for Healthcare Providers:  ?IncredibleEmployment.be ? ?This test is no t yet approved or cleared by the [place] FDA and  ?has been authorized for detection and/or diagnosis of SARS-CoV-2 by ?FDA under an Emergency Use Authorization (EUA). This EUA will remain  ?in effect (meaning this test can be used) for the duration of the ?COVID-19 declaration under Section 564(b)(1) of the Act, 21 ?U.S.C.section 360bbb-3(b)(1), unless the authorization is terminated  ?or revoked sooner.  ? ? ?  ? Influenza A by PCR NEGATIVE NEGATIVE Final  ? Influenza B by PCR NEGATIVE NEGATIVE Final  ?  Comment: (NOTE) ?The Xpert Xpress SARS-CoV-2/FLU/RSV plus assay is intended as an aid ?in the diagnosis of influenza from Nasopharyngeal swab specimens and ?should not be used as a sole basis for treatment. Nasal washings and ?aspirates are unacceptable for Xpert Xpress SARS-CoV-2/FLU/RSV ?testing. ? ?Fact Sheet for Patients: ?EntrepreneurPulse.com.au ? ?Fact Sheet for Healthcare Providers: ?IncredibleEmployment.be ? ?This test is not yet approved or cleared by the [place] FDA and ?has been authorized for detection and/or diagnosis of SARS-CoV-2 by ?FDA under an Emergency Use Authorization (EUA). This EUA will remain ?in effect (meaning this test can be used) for the duration of the ?COVID-19 declaration under Section 564(b)(1) of the Act, 21 U.S.C. ?section 360bbb-3(b)(1), unless the authorization is terminated  or ?revoked. ? ?Performed at [facility], [place], ?[place] [address] ?  ?Culture, blood (routine x 2)     Status: None  ? Collection Time: 03/08/21  1:07 AM  ? Specimen: BLOOD  ?Result Value Ref Range Status  ? Specimen Description BLOOD RIGHT HAND  Final  ? Special Requests   Final  ?  BOTTLES DRAWN AEROBIC AND ANAEROBIC Blood Culture results may not be optimal due to an inadequate volume of blood received in culture bottles  ? Culture   Final  ?  NO GROWTH 5 DAYS ?Performed at [facility], [address] [place], [place] [address] ?  ? Report Status 03/13/2021 FINAL  Final  ?Urine Culture     Status: Abnormal  ? Collection Time: 03/08/21  1:17 AM  ? Specimen: Urine, Clean Catch  ?Result Value Ref Range Status  ? Specimen Description   Final  ?  URINE, CLEAN CATCH ?Performed at [facility], [address], [place], [place] [address] ?  ? Special Requests   Final  ?  NONE ?Performed at [facility], [address], [place], [place] [address] ?  ? Culture (A)  Final  ?  20,000 COLONIES/mL STREPTOCOCCUS AGALACTIAE ?TESTING AGAINST S. AGALACTIAE NOT ROUTINELY PERFORMED DUE TO PREDICTABILITY OF AMP/PEN/VAN SUSCEPTIBILITY. ?Performed at [facility],  [address], [place], [place] [address] ?  ? Report Status 03/09/2021 FINAL  Final  ?Culture, blood (routine x 2)     Status: None  ? Collection Time: 03/08/21  2:02 AM  ? Specimen: BLOOD  ?Result Value Ref Range Status  ? Specimen Description BLOOD RIGHT HAND  Final  ? Special Requests   Final  ?  BOTTLES DRAWN AEROBIC AND ANAEROBIC Blood Culture adequate volume  ? Culture   Final  ?  NO GROWTH 5 DAYS ?Performed at [facility] [address], [place], [place] [address] ?  ? Report Status 03/13/2021 FINAL  Final  ? ? ?Coagulation Studies: ?No results for input(s): LABPROT, INR in the last 72 hours. ? ?Urinalysis: ?No results for input(s): COLORURINE, LABSPEC, [place], GLUCOSEU, HGBUR,  BILIRUBINUR, KETONESUR, PROTEINUR, UROBILINOGEN, NITRITE, LEUKOCYTESUR in the last 72 hours. ? ?Invalid input(s): APPERANCEUR  ? ? ?Imaging: ?No results found. ? ? ?Medications:  ? ? ? ? amiodarone  200 mg Oral BID  ?

## 2021-05-29 NOTE — Progress Notes (Signed)
Patient discharged, awaiting son to pick him up. ?

## 2021-05-30 ENCOUNTER — Ambulatory Visit: Payer: Medicare Other | Admitting: Family

## 2021-05-30 SURGERY — LAPAROSCOPIC INSERTION CONTINUOUS AMBULATORY PERITONEAL DIALYSIS  (CAPD) CATHETER
Anesthesia: Choice

## 2021-06-12 NOTE — Progress Notes (Signed)
Cardiology Office Note ? ?Date:  06/13/2021  ? ?ID:  Timothy Riley, DOB 1940/10/03, MRN [id number] ? ?PCP:  Derinda Late, MD  ? ?Chief Complaint  ?Patient presents with  ? 1 month follow up   ?  "Doing well." Medications reviewed by the patient verbally.   ? ? 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 ?LOV with myself in 3/23 ?Seen in the hospital by our team end of April 2023 ?Acute on chronic diastolic and systolic CHF ejection fraction 30 to 35% ?Underwent cardioversion for atrial fibrillation, amiodarone loaded ?Recommendation for outpatient Myoview given drop in ejection fraction ?Denies tachypalpitations concerning for recurrent arrhythmia ? ?Mild LE edema ?Taking lasix 80 mg daily, makes urine (not much) ?Followed by nephrology ? ?Energy coming back ?Legs weak, uses a case, ? ?In hosptal  7/62,  diastolic CHF, records reviewed ?In the setting of renal failure, atrial fibrillation ? ?Creatinine 1.82 BUN 26 ?Hemoglobin 10.7 ? ?EKG personally reviewed by myself on todays visit ?Normal sinus rhythm rate 57 bpm, rbbb ? ?Other past medical history reviewed ?Echo 03/2018,  ? 1. The left ventricle has normal systolic function with an ejection fraction of 60-65%. The cavity size was normal. There is mildly increased left ventricular wall thickness. Left ventricular diastolic Doppler parameters are consistent with impaired  ?relaxation. ? 2. The right ventricle has normal systolic function. The cavity was normal. There is no increase in right ventricular wall thickness. ? 3. Left atrial size was mildly dilated. ? 4. A  bovine bioprosthesis valve is present in the aortic position. Procedure Date: 03/01/14 Normal aortic valve prosthesis. AV Mean Grad: 16.0 mmHg ? 5. Right atrial pressure is estimated at 10 mmHg ? ?Went to the emergency room November 05, 2017 ?atrial fibrillation,  ?Started on anticoagulation at that time, eliquis 5 BID ? ? hypotensive on a previous clinic visit, stopped his tamsulosin, chlorthalidone ?  ?Previous notes from outside cardiologist says he has mild coronary artery disease, details not available through care everywhere ?Followed by endocrine for his diabetes ?  ?Prior episodes of SVT requiring adenosine ? ?  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 ? ?PSH:    ?Past Surgical History:  ?Procedure Laterality Date  ? AORTIC VALVE REPLACEMENT    ? CARDIAC CATHETERIZATION    ? CARDIOVERSION N/A 01/01/2018  ? Procedure: CARDIOVERSION;  Surgeon: Timothy Merritts, MD;  Location: [facility] ORS;  Service: Cardiovascular;  Laterality: N/A;  ? CARDIOVERSION N/A 05/16/2021  ? Procedure: CARDIOVERSION;  Surgeon: Timothy Bush, MD;  Location: [facility];  Service: Cardiovascular;  Laterality: N/A;  ? CARDIOVERSION N/A 05/17/2021  ? Procedure: CARDIOVERSION;  Surgeon: Timothy Sable, MD;  Location: [facility];  Service: Cardiovascular;  Laterality:  N/A;  ? COLONOSCOPY  2010  ? DIALYSIS/PERMA CATHETER INSERTION N/A 05/26/2021  ? Procedure: DIALYSIS/PERMA CATHETER INSERTION;   Surgeon: Timothy Cabal, MD;  Location: [place] [facility];  Service: Cardiovascular;  Laterality: N/A;  ? heart valve replacment  2015  ? TEMPORARY DIALYSIS CATHETER N/A 05/23/2021  ? Procedure: TEMPORARY DIALYSIS CATHETER;  Surgeon: Timothy Cabal, MD;  Location: [facility];  Service: Cardiovascular;  Laterality: N/A;  ? XI ROBOTIC ASSISTED INGUINAL HERNIA REPAIR WITH MESH Right 01/07/2019  ? Procedure: XI ROBOTIC ASSISTED INGUINAL HERNIA REPAIR WITH MESH;  Surgeon: Timothy Bacon, MD;  Location: [facility];  Service: General;  Laterality: Right;  ? ? ?Current Outpatient Medications  ?Medication Sig Dispense Refill  ? amiodarone (PACERONE) 200 MG tablet Take 1 tablet (200 mg total) by mouth daily. 30 tablet 2  ? carvedilol (COREG) 6.25 MG tablet Take 1 tablet (6.25 mg total) by mouth 2 (two) times daily with a meal. 60 tablet 2  ? Cholecalciferol (VITAMIN D3 PO) Take by mouth daily.    ? doxazosin (CARDURA) 8 MG tablet TAKE 1 TABLET BY MOUTH TWICE A DAY 60 tablet 11  ? ELIQUIS 2.5 MG TABS tablet TAKE 1 TABLET BY MOUTH TWICE A DAY 180 tablet 1  ? furosemide (LASIX) 80 MG tablet Take 1 tablet (80 mg total) by mouth daily. 30 tablet 2  ? glipiZIDE (GLUCOTROL) 10 MG tablet Take 1 tablet (10 mg total) by mouth daily before breakfast. Home med.    ? pioglitazone (ACTOS) 30 MG tablet TAKE 1 TABLET BY MOUTH ONCE DAILY 90 tablet 1  ? rosuvastatin (CRESTOR) 10 MG tablet Take 1 tablet (10 mg total) by mouth daily. 90 tablet 0  ? ?No current facility-administered medications for this visit.  ? ? ?Allergies:   Gabapentin, Hydralazine, and Hydralazine hcl  ? ?Social History:  The patient  reports that he quit smoking about 49 years ago. His smoking use included cigarettes. He has a 0.50 pack-year smoking history. He has never used smokeless tobacco. He reports that he does not drink alcohol and does not use drugs.  ? ?Family History:   family history includes Cancer in his mother; Stroke in his father.   ? ?Review of Systems: ?Review of Systems  ?Constitutional: Negative.   ?HENT: Negative.    ?Respiratory: Negative.    ?Cardiovascular:  Positive for leg swelling.  ?Gastrointestinal: Negative.   ?Musculoskeletal: Negative.   ?Neurological: Negative.   ?Psychiatric/Behavioral: Negative.    ?All other systems reviewed and are negative. ? ?PHYSICAL EXAM: ?VS:  BP (!) 120/52 (BP Location: Left Arm, Patient Position: Sitting, Cuff Size: Normal)   Pulse (!) 57   Ht '5\' 8"'$  (1.727 m)   Wt 182 lb 4 oz (82.7 kg)   SpO2 94%   BMI 27.71 kg/m?  , BMI Body mass index is 27.71 kg/m?Marland Kitchen ?Constitutional:  oriented to person, place, and time. No distress.  ?HENT:  ?Head: Grossly normal ?Eyes:  no discharge. No scleral icterus.  ?Neck: No JVD, no carotid bruits  ?Cardiovascular: Regular rate and rhythm, no murmurs appreciated ?Trace to 1+ pitting edema around the sock line mid to lower shins ?Pulmonary/Chest: Clear to auscultation bilaterally, no wheezes or rails ?Abdominal: Soft.  no distension.  no tenderness.  ?Musculoskeletal: Normal range of motion ?Neurological:  normal muscle tone. Coordination normal. No atrophy ?Skin: Skin warm and dry ?Psychiatric: normal affect, pleasant ? ? ?Recent Labs: ?03/09/2021: ALT 8 ?05/13/2021: B Natriuretic Peptide 1,597.6 ?05/15/2021: TSH 5.107 ?05/28/2021:  BUN 26; Creatinine, Ser 1.82; Hemoglobin 10.7; Magnesium 2.0; Platelets 154; Potassium 3.6; Sodium 140  ? ? ?Lipid Panel ?Lab Results  ?Component Value Date  ? CHOL 135 12/23/2018  ? HDL 28 (L) 12/23/2018  ? [place] 79 12/23/2018  ? TRIG 185 (H) 12/23/2018  ? ? ?Wt Readings from Last 3 Encounters:  ?06/13/21 182 lb 4 oz (82.7 kg)  ?05/28/21 185 lb 6.5 oz (84.1 kg)  ?05/09/21 203 lb 6 oz (92.3 kg)  ?  ? ?ASSESSMENT AND PLAN: ? ?Atrial fibrillation, persistent ?Prior cardioversion, maintaining normal sinus rhythm ?Appears to be doing better in normal sinus rhythm with hemodialysis for fluid control ?Reports still makes urine, takes Lasix 80  daily ?Recommend he continue amiodarone 200 daily with Coreg 6.25 twice daily ? ?Acute on chronic renal failure, end-stage renal ?Followed by nephrology, on hemodialysis 3 days a week Monday Wednesday Friday ?Reports to

## 2021-06-13 ENCOUNTER — Ambulatory Visit (INDEPENDENT_AMBULATORY_CARE_PROVIDER_SITE_OTHER): Payer: Medicare Other | Admitting: Cardiovascular Disease

## 2021-06-13 ENCOUNTER — Encounter: Payer: Self-pay | Admitting: Cardiovascular Disease

## 2021-06-13 VITALS — BP 120/52 | HR 57 | Ht 68.0 in | Wt 182.2 lb

## 2021-06-13 DIAGNOSIS — I4819 Other persistent atrial fibrillation: Secondary | ICD-10-CM

## 2021-06-13 DIAGNOSIS — I420 Dilated cardiomyopathy: Secondary | ICD-10-CM

## 2021-06-13 DIAGNOSIS — I2510 Atherosclerotic heart disease of native coronary artery without angina pectoris: Secondary | ICD-10-CM | POA: Diagnosis not present

## 2021-06-13 DIAGNOSIS — Z952 Presence of prosthetic heart valve: Secondary | ICD-10-CM

## 2021-06-13 DIAGNOSIS — N186 End stage renal disease: Secondary | ICD-10-CM

## 2021-06-13 DIAGNOSIS — I5042 Chronic combined systolic (congestive) and diastolic (congestive) heart failure: Secondary | ICD-10-CM

## 2021-06-13 DIAGNOSIS — D649 Anemia, unspecified: Secondary | ICD-10-CM

## 2021-06-13 NOTE — Patient Instructions (Addendum)
Medication Instructions:  ?No changes ? ?If you need a refill on your cardiac medications before your next appointment, please call your pharmacy.  ? ?Lab work: ?No new labs needed ? ?Testing/Procedures: ?[facility] MYOVIEW ? ?Your caregiver has ordered a Stress Test with nuclear imaging. The purpose of this test is to evaluate the blood supply to your heart muscle. This procedure is referred to as a "Non-Invasive Stress Test." This is because other than having an IV started in your vein, nothing is inserted or "invades" your body. Cardiac stress tests are done to find areas of poor blood flow to the heart by determining the extent of coronary artery disease (CAD). Some patients exercise on a treadmill, which naturally increases the blood flow to your heart, while others who are  unable to walk on a treadmill due to physical limitations have a pharmacologic/chemical stress agent called Lexiscan . This medicine will mimic walking on a treadmill by temporarily increasing your coronary blood flow.  ? ?Please note: these test may take anywhere between 2-4 hours to complete ? ?PLEASE REPORT TO [facility] MEDICAL MALL ENTRANCE  ?THE VOLUNTEERS AT THE FIRST DESK WILL DIRECT YOU WHERE TO GO ? ?Date of Procedure:_____________________________________ ? ?Arrival Time for Procedure:______________________________ ? ?Instructions regarding medication:  ? ?X : Hold diabetes medication morning of procedure ? ? ?PLEASE NOTIFY THE OFFICE AT LEAST 36 HOURS IN ADVANCE IF YOU ARE UNABLE TO KEEP YOUR APPOINTMENT.  [phone] ?AND  ?PLEASE NOTIFY NUCLEAR MEDICINE AT [facility] AT LEAST 39 HOURS IN ADVANCE IF YOU ARE UNABLE TO KEEP YOUR APPOINTMENT. [phone] ? ?How to prepare for your Myoview test: ? ?Do not eat or drink after midnight ?No caffeine for 24 hours prior to test ?No smoking 24 hours prior to test. ?Your medication may be taken with water.  If your doctor stopped a medication because of this test, do not take that medication. ?Ladies,  please do not wear dresses.  Skirts or pants are appropriate. Please wear a short sleeve shirt. ?No perfume, cologne or lotion. ?Wear comfortable walking shoes. No heels! ? ? ?Follow-Up: ?At [facility], you and your health needs are our priority.  As part of our continuing mission to provide you with exceptional heart care, we have created designated Provider Care Teams.  These Care Teams include your primary Cardiologist (physician) and Advanced Practice Providers (APPs -  Physician Assistants and Nurse Practitioners) who all work together to provide you with the care you need, when you need it. ? ?You will need a follow up appointment in 3 months ? ?Providers on your designated Care Team:   ?Murray Hodgkins, NP ?Christell Faith, PA-C ?Cadence Kathlen Mody, PA-C ? ?COVID-19 Vaccine Information can be found at: ShippingScam.co.uk For questions related to vaccine distribution or appointments, please email [email or web address] or call [phone].  ? ?

## 2021-06-23 ENCOUNTER — Other Ambulatory Visit: Payer: Self-pay | Admitting: Cardiovascular Disease

## 2021-06-23 NOTE — Telephone Encounter (Signed)
Please advise on whether or not to refill. Med not listed on last AVS or in chart.

## 2021-06-27 ENCOUNTER — Telehealth: Payer: Self-pay | Admitting: Cardiovascular Disease

## 2021-06-27 NOTE — Telephone Encounter (Signed)
Patient called and wanted to know if Dr. Rockey Situ will prescribe him to get Amoxicillin again before his dental procedure. Please call to discuss

## 2021-06-28 ENCOUNTER — Other Ambulatory Visit: Payer: Self-pay | Admitting: Cardiovascular Disease

## 2021-06-28 ENCOUNTER — Telehealth: Payer: Self-pay | Admitting: Cardiovascular Disease

## 2021-06-28 NOTE — Telephone Encounter (Signed)
    Pre-operative Risk Assessment    Patient Name: Timothy Riley  DOB: 04/09/1940 MRN: [id number]     Request for Surgical Clearance    Procedure:   dental cleaning and maybe fillings  Date of Surgery:  Clearance 07/04/21                                 Surgeon:  Dr. Peggye Pitt Surgeon's Group or Practice Name:  [facility]  Phone number:  [phone] Fax number:  [phone]   Type of Clearance Requested:   - Medical    Type of Anesthesia:  None    Additional requests/questions:  Does this patient need antibiotics?  Signed, Selinda Orion   06/28/2021, 4:54 PM

## 2021-06-29 ENCOUNTER — Encounter
Admission: RE | Admit: 2021-06-29 | Discharge: 2021-06-29 | Disposition: A | Discharge status: Home / Self Care | Payer: Medicare Other | Source: Ambulatory Visit | Attending: Cardiovascular Disease | Admitting: Cardiovascular Disease

## 2021-06-29 DIAGNOSIS — I4819 Other persistent atrial fibrillation: Secondary | ICD-10-CM | POA: Insufficient documentation

## 2021-06-29 MED ORDER — AMOXICILLIN 500 MG PO CAPS: ORAL_CAPSULE | ORAL | 2 refills | Status: DC

## 2021-06-29 NOTE — Telephone Encounter (Signed)
Yes pt requires SBE ppx given hx of bioprosthetic AVR. Looks like RN has already sent in rx for amoxicillin today, pt should be all set.

## 2021-06-29 NOTE — Telephone Encounter (Signed)
   Patient Name: Timothy Riley  DOB: 11/29/1940 MRN: [id number]  Primary Cardiologist: Ida Rogue, MD  Chart reviewed as part of pre-operative protocol coverage.   Simple dental extractions (i.e. 1-2 teeth), cleaning and filling are considered low risk procedures per guidelines and generally do not require any specific cardiac clearance. It is also generally accepted that for simple extractions and dental cleanings, there is no need to interrupt blood thinner therapy.  SBE prophylaxis is required for the patient from a cardiac standpoint.  Will forward to our clinical pharmacist for SBE prophylaxis given history of AVR on 03/01/2014 with bovine valve.  I will route this recommendation to the requesting party via Epic fax function and remove from pre-op pool.  Please call with questions.  [place], [place] 06/29/2021, 12:26 PM

## 2021-06-29 NOTE — Telephone Encounter (Signed)
Advised front staff that phone message was sent to MD and waiting on reply as MD is out of the office.   Advised front staff to relay to the patient that he should get a reply on Monday.

## 2021-06-29 NOTE — Telephone Encounter (Signed)
Patient came to get status of Amoxicillin has tried to call office & pharmacy for the pass 5days with no luck. Concerned if he will receive before dental procedure next week.

## 2021-06-29 NOTE — Telephone Encounter (Signed)
Timothy Merritts, MD    Ok to send in amoxicillin 500 mg tabs x 4 (total 2000 mg)  Has prosthetic aortic valve  Thx  TG    Called patient. No answer, no voicemail box set up.   Prescription sent into patient's pharmacy.   Will attempt to call patient at later time to let him know.

## 2021-07-11 ENCOUNTER — Encounter (HOSPITAL_BASED_OUTPATIENT_CLINIC_OR_DEPARTMENT_OTHER)
Admission: RE | Admit: 2021-07-11 | Discharge: 2021-07-11 | Disposition: A | Discharge status: Home / Self Care | Payer: Medicare Other | Source: Ambulatory Visit | Attending: Cardiovascular Disease | Admitting: Cardiovascular Disease

## 2021-07-11 DIAGNOSIS — I4819 Other persistent atrial fibrillation: Secondary | ICD-10-CM

## 2021-07-11 MED ORDER — REGADENOSON 0.4 MG/5ML IV SOLN
0.4000 mg | Freq: Once | INTRAVENOUS | Status: AC
  Administered 2021-07-11: 0.4 mg via INTRAVENOUS

## 2021-07-11 MED ORDER — TECHNETIUM TC 99M TETROFOSMIN IV KIT
32.7000 | PACK | Freq: Once | INTRAVENOUS | Status: AC | PRN
  Administered 2021-07-11: 32.7 via INTRAVENOUS

## 2021-07-11 MED ORDER — TECHNETIUM TC 99M TETROFOSMIN IV KIT
10.3500 | PACK | Freq: Once | INTRAVENOUS | Status: AC | PRN
  Administered 2021-07-11: 10.35 via INTRAVENOUS

## 2021-07-12 LAB — NM MYOCAR MULTI W/SPECT W/WALL MOTION / EF
LV dias vol: 235 mL (ref 62–150)
LV sys vol: 161 mL
Nuc Stress EF: 31 %
Peak HR: 75 {beats}/min
Percent HR: 54 %
Rest HR: 58 {beats}/min
Rest Nuclear Isotope Dose: 10.4 mCi
SDS: 6
SRS: 7
SSS: 10
Stress Nuclear Isotope Dose: 32.7 mCi
TID: 1.15

## 2021-07-17 ENCOUNTER — Telehealth: Payer: Self-pay | Admitting: Emergency Medicine

## 2021-07-17 NOTE — Telephone Encounter (Signed)
Called patient to go over results. No answer. No VM.   Will attempt again later.

## 2021-07-17 NOTE — Telephone Encounter (Signed)
-----   Message from Minna Merritts, MD sent at 07/16/2021  8:25 PM EDT ----- Stress test No significant ischemia Moderately depressed ejection fraction which we already knew about from echo April 2023 Region of old scar noted in the apical region No strong indication for cardiac catheterization at this time especially in light of renal dysfunction

## 2021-07-19 NOTE — Telephone Encounter (Signed)
Patient came by - anxious to hear results  Patient states sometimes his cell phone does not work well If unable to get him again, please call son at [phone]

## 2021-07-19 NOTE — Telephone Encounter (Signed)
Pt is calling for results. He says to try his number first, and if that does not work to please call his son at [phone]

## 2021-07-19 NOTE — Telephone Encounter (Signed)
Called patient to go over results. No answer. No VM.

## 2021-07-20 NOTE — Telephone Encounter (Signed)
   Pt is calling back. He said, to call him or his son

## 2021-08-03 ENCOUNTER — Telehealth (INDEPENDENT_AMBULATORY_CARE_PROVIDER_SITE_OTHER): Payer: Self-pay

## 2021-08-03 NOTE — Telephone Encounter (Signed)
I attempted to contact the patient to have him scheduled for a permcath removal per Dr. Holley Raring. Patient can be scheduled on 08/04/21. I was unable to leave a message as the voice mail box is not set up.

## 2021-08-03 NOTE — Telephone Encounter (Signed)
Patient returned my call and is now scheduled with Dr. Delana Meyer for a permcath removal on 08/04/21 with a 10:30 am arrival to the [facility]. Pre-procedure instructions were discussed and patient understood.

## 2021-08-04 ENCOUNTER — Encounter: Payer: Self-pay | Admitting: Vascular Surgery

## 2021-08-04 ENCOUNTER — Other Ambulatory Visit: Payer: Self-pay

## 2021-08-04 ENCOUNTER — Ambulatory Visit
Admission: RE | Admit: 2021-08-04 | Discharge: 2021-08-04 | Disposition: A | Discharge status: Home / Self Care | Payer: Medicare Other | Attending: Vascular Surgery | Admitting: Vascular Surgery

## 2021-08-04 ENCOUNTER — Encounter
Admission: RE | Disposition: A | Discharge status: Home / Self Care | Payer: Self-pay | Source: Home / Self Care | Attending: Vascular Surgery

## 2021-08-04 DIAGNOSIS — Z87891 Personal history of nicotine dependence: Secondary | ICD-10-CM | POA: Diagnosis not present

## 2021-08-04 DIAGNOSIS — E1122 Type 2 diabetes mellitus with diabetic chronic kidney disease: Secondary | ICD-10-CM | POA: Insufficient documentation

## 2021-08-04 DIAGNOSIS — Z7984 Long term (current) use of oral hypoglycemic drugs: Secondary | ICD-10-CM | POA: Diagnosis not present

## 2021-08-04 DIAGNOSIS — N186 End stage renal disease: Secondary | ICD-10-CM | POA: Insufficient documentation

## 2021-08-04 DIAGNOSIS — I120 Hypertensive chronic kidney disease with stage 5 chronic kidney disease or end stage renal disease: Secondary | ICD-10-CM | POA: Diagnosis not present

## 2021-08-04 DIAGNOSIS — N185 Chronic kidney disease, stage 5: Secondary | ICD-10-CM | POA: Diagnosis not present

## 2021-08-04 DIAGNOSIS — Z4901 Encounter for fitting and adjustment of extracorporeal dialysis catheter: Secondary | ICD-10-CM | POA: Diagnosis not present

## 2021-08-04 HISTORY — PX: DIALYSIS/PERMA CATHETER REMOVAL: CATH118289

## 2021-08-04 SURGERY — DIALYSIS/PERMA CATHETER REMOVAL
Anesthesia: LOCAL

## 2021-08-04 MED ORDER — LIDOCAINE-EPINEPHRINE (PF) 1 %-1:200000 IJ SOLN
INTRAMUSCULAR | Status: DC | PRN
  Administered 2021-08-04: 20 mL via INTRADERMAL

## 2021-08-04 SURGICAL SUPPLY — 6 items
CHLORAPREP W/TINT 26 (MISCELLANEOUS) ×2 IMPLANT
DERMABOND ADVANCED (GAUZE/BANDAGES/DRESSINGS) ×1
DERMABOND ADVANCED .7 DNX12 (GAUZE/BANDAGES/DRESSINGS) IMPLANT
FORCEPS HALSTEAD CVD 5IN STRL (INSTRUMENTS) ×1 IMPLANT
SUT MNCRL AB 4-0 PS2 18 (SUTURE) ×1 IMPLANT
TRAY LACERAT/PLASTIC (MISCELLANEOUS) ×1 IMPLANT

## 2021-08-04 NOTE — Op Note (Signed)
  OPERATIVE NOTE   PROCEDURE: Removal of a right IJ tunneled dialysis catheter  PRE-OPERATIVE DIAGNOSIS: Stage IV renal insufficiency  POST-OPERATIVE DIAGNOSIS: Same  SURGEON: [name]  ANESTHESIA: Local anesthetic with 1% lidocaine with epinephrine   ESTIMATED BLOOD LOSS: Minimal   FINDING(S): 1. Catheter intact   SPECIMEN(S):  Catheter  INDICATIONS:   Timothy Riley is a 81 y.o. male who presents with recovery of his renal function back to stage IV his baseline.  Therefore the catheter is no longer being utilized and should be removed.  Risk and benefits of been reviewed patient agrees to proceed..  DESCRIPTION: After obtaining full informed written consent, the patient was positioned supine. The right IJ catheter and surrounding area is prepped and draped in a sterile fashion. The cuff was localized by palpation and noted to be greater than 3 cm from the exit site. After appropriate timeout is called, 1% lidocaine with epinephrine is infiltrated into the surrounding tissues around the cuff. Small transverse incision is created with an 11 blade scalpel and the dissection was carried down to expose the cuff of the tunneled catheter.  The catheter is then freed from the surrounding attachments and adhesions. Once the catheter has been freed circumferentially it is transected just distal to the cuff and subsequently removed in 2 pieces. Light pressure was held at the base of the neck. A 4-0 Monocryl was used close the tunnel in the subcutaneous space. The 4-0 Monocryl Monocryl was then used to close the skin in a subcuticular stitch. Dermabond is applied.  Antibiotic ointment and a sterile dressing is applied to the exit site. Patient tolerated procedure well and there were no complications.  COMPLICATIONS: None  CONDITION: Unchanged  [name]. [facility] Office: [phone]  08/04/2021,11:20 AM

## 2021-08-04 NOTE — Progress Notes (Signed)
MRN : [id number]  Timothy Riley is a 81 y.o. (1940-04-19) male who presents with chief complaint of remove tunneled catheter.  History of Present Illness:     Patient presents to [facility] at the request of nephrology for removal of his tunneled catheter.  Patient no longer needs the tunneled catheter.  He is denying fevers chills.  Current Meds  Medication Sig   amiodarone (PACERONE) 200 MG tablet Take 1 tablet (200 mg total) by mouth daily.   amoxicillin (AMOXIL) 500 MG capsule TAKE 4 CAPSULES 1 HOUR BEFORE DENTAL APPOINTMENT   carvedilol (COREG) 6.25 MG tablet Take 1 tablet (6.25 mg total) by mouth 2 (two) times daily with a meal.   Cholecalciferol (VITAMIN D3 PO) Take by mouth daily.   doxazosin (CARDURA) 8 MG tablet TAKE 1 TABLET BY MOUTH TWICE A DAY   ELIQUIS 2.5 MG TABS tablet TAKE 1 TABLET BY MOUTH TWICE A DAY   furosemide (LASIX) 80 MG tablet Take 1 tablet (80 mg total) by mouth daily.   glipiZIDE (GLUCOTROL) 10 MG tablet Take 1 tablet (10 mg total) by mouth daily before breakfast. Home med.   pioglitazone (ACTOS) 30 MG tablet TAKE 1 TABLET BY MOUTH ONCE DAILY   rosuvastatin (CRESTOR) 10 MG tablet Take 1 tablet (10 mg total) by mouth daily.    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 Glower MD @ [facility] via R thoracic approach; b. 03/2018 Echo: EF 60-65%, DD. Mildly dil LA. Nl fxn AoV prosthesis. Grad 10mHg.   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 12/2013 Cath ([facility]): LAD 30p, RI 60, otw nl ([place]; b. 03/2018 MV: EF 59%, no ischemia/scar.   Persistent atrial fibrillation ([facility] 10/2017   a. CHA2DS2VASc = 5-->Eliquis; b. 12/2017 loaded w/amio-->DCCV.   PSVT (paroxysmal supraventricular tachycardia) ([facility]    a. 05/2014 s/p RFCA @ [facility].   Right inguinal hernia    Urinary incontinence     Past Surgical History:  Procedure Laterality Date   AORTIC VALVE REPLACEMENT     CARDIAC CATHETERIZATION     CARDIOVERSION N/A 01/01/2018   Procedure: CARDIOVERSION;  Surgeon: GMinna Merritts MD;  [place];  Service: Cardiovascular;  Laterality: N/A;   CARDIOVERSION N/A 05/16/2021   Procedure: CARDIOVERSION;  Surgeon: ENelva Bush MD;  [place];  Service: Cardiovascular;  Laterality: N/A;   CARDIOVERSION N/A 05/17/2021   Procedure: CARDIOVERSION;  Surgeon: AKate Sable MD;  Location: [facility];  Service: Cardiovascular;  Laterality: N/A;   COLONOSCOPY  2010   DIALYSIS/PERMA CATHETER INSERTION N/A 05/26/2021   Procedure: DIALYSIS/PERMA CATHETER INSERTION;  Surgeon: SKatha Cabal MD;  [facility] [facility];  Service: Cardiovascular;  Laterality: N/A;   heart valve replacment  2015   TEMPORARY DIALYSIS CATHETER N/A 05/23/2021  Procedure: TEMPORARY DIALYSIS CATHETER;  Surgeon: Katha Cabal, MD;  Location: [place] [facility];  Service: Cardiovascular;  Laterality: N/A;   XI ROBOTIC ASSISTED INGUINAL HERNIA REPAIR WITH MESH Right 01/07/2019   Procedure: XI ROBOTIC ASSISTED INGUINAL HERNIA REPAIR WITH MESH;  Surgeon: Ronny Bacon, MD;  Location: [facility];  Service: General;  Laterality: Right;    Social History Social History   Tobacco Use   Smoking status: Former    Packs/day: 0.50    Years: 1.00    Total pack years: 0.50    Types: Cigarettes    Quit date: 01/30/1972    Years since quitting: 49.5    Smokeless tobacco: Never   Tobacco comments:    over 58 yrs ago  Vaping Use   Vaping Use: Never used  Substance Use Topics   Alcohol use: No    Alcohol/week: 0.0 standard drinks of alcohol   Drug use: No    Family History Family History  Problem Relation Age of Onset   Cancer Mother    Stroke Father     Allergies  Allergen Reactions   Gabapentin Other (See Comments) and Rash    dizzy dizzy   Hydralazine Anxiety and Rash   Hydralazine Hcl Rash     REVIEW OF SYSTEMS (Negative unless checked)  Constitutional: '[]'$ Weight loss  '[]'$ Fever  '[]'$ Chills Cardiac: '[]'$ Chest pain   '[]'$ Chest pressure   '[]'$ Palpitations   '[]'$ Shortness of breath when laying flat   '[]'$ Shortness of breath with exertion. Vascular:  '[]'$ Pain in legs with walking   '[]'$ Pain in legs at rest  '[]'$ History of DVT   '[]'$ Phlebitis   '[]'$ Swelling in legs   '[]'$ Varicose veins   '[]'$ Non-healing ulcers Pulmonary:   '[]'$ Uses home oxygen   '[]'$ Productive cough   '[]'$ Hemoptysis   '[]'$ Wheeze  '[]'$ COPD   '[]'$ Asthma Neurologic:  '[]'$ Dizziness   '[]'$ Seizures   '[]'$ History of stroke   '[]'$ History of TIA  '[]'$ Aphasia   '[]'$ Vissual changes   '[]'$ Weakness or numbness in arm   '[]'$ Weakness or numbness in leg Musculoskeletal:   '[]'$ Joint swelling   '[]'$ Joint pain   '[]'$ Low back pain Hematologic:  '[]'$ Easy bruising  '[]'$ Easy bleeding   '[]'$ Hypercoagulable state   '[]'$ Anemic Gastrointestinal:  '[]'$ Diarrhea   '[]'$ Vomiting  '[]'$ Gastroesophageal reflux/heartburn   '[]'$ Difficulty swallowing. Genitourinary:  '[x]'$ Chronic kidney disease   '[]'$ Difficult urination  '[]'$ Frequent urination   '[]'$ Blood in urine Skin:  '[]'$ Rashes   '[]'$ Ulcers  Psychological:  '[]'$ History of anxiety   '[]'$  History of major depression.  Physical Examination  Vitals:   08/04/21 0958  BP: (!) 165/90  Pulse: (!) 49  Resp: 18  Temp: 98.4 F (36.9 C)  TempSrc: Oral  SpO2: 100%   There is no height or weight on file to calculate BMI. Gen: WD/WN, NAD Head: [place]/AT, No temporalis wasting.  Ear/Nose/Throat: Hearing grossly intact, nares w/o erythema or  drainage Eyes: PER, EOMI, sclera nonicteric.  Neck: Supple, no gross masses or lesions.  No JVD.  Pulmonary:  Good air movement, no audible wheezing, no use of accessory muscles.  Cardiac: RRR, precordium non-hyperdynamic. Vascular:   Right IJ tunnel catheter clean dry and intact Vessel Right Left  Radial Palpable Palpable  Brachial Palpable Palpable  Gastrointestinal: soft, non-distended. No guarding/no peritoneal signs.  Musculoskeletal: M/S 5/5 throughout.  No deformity.  Neurologic: CN 2-12 intact. Pain and light touch intact in extremities.  Symmetrical.  Speech is fluent. Motor exam as listed above. Psychiatric: Judgment intact, Mood & affect appropriate for pt's clinical situation. Dermatologic: No rashes or  ulcers noted.  No changes consistent with cellulitis.   CBC Lab Results  Component Value Date   WBC 6.9 05/28/2021   HGB 10.7 (L) 05/28/2021   HCT 34.1 (L) 05/28/2021   MCV 88.8 05/28/2021   PLT 154 05/28/2021    BMET    Component Value Date/Time   NA 140 05/28/2021 0417   NA 148 (H) 05/09/2021 1135   NA 140 05/28/2014 1725   K 3.6 05/28/2021 0417   K 3.8 05/28/2014 1725   CL 101 05/28/2021 0417   CL 107 05/28/2014 1725   CO2 28 05/28/2021 0417   CO2 26 05/28/2014 1725   GLUCOSE 150 (H) 05/28/2021 0417   GLUCOSE 160 (H) 05/28/2014 1725   BUN 26 (H) 05/28/2021 0417   BUN 37 (H) 05/09/2021 1135   BUN 25 (H) 05/28/2014 1725   CREATININE 1.82 (H) 05/28/2021 0417   CREATININE 2.57 (H) 12/23/2018 0000   CALCIUM 8.6 (L) 05/28/2021 0417   CALCIUM 9.0 05/28/2014 1725   GFRNONAA 37 (L) 05/28/2021 0417   GFRNONAA 23 (L) 12/23/2018 0000   GFRAA 26 (L) 12/23/2018 0000   CrCl cannot be calculated (Patient's most recent lab result is older than the maximum 21 days allowed.).  COAG Lab Results  Component Value Date   INR 2.1 (H) 05/16/2021   INR 1.4 (H) 12/31/2018    Radiology NM Myocar Multi W/Spect W/Wall Motion / EF  Result Date: 07/12/2021   Abnormal  pharmacologic myocardial perfusion stress test.   There is a moderate in size, moderate in severity, fixed apical inferior, apical lateral, and apical defect most consistent with scar but cannot rule out artifact.   There is no evidence of significant ischemia.   Left ventricular systolic function is moderately to severely reduced (LVEF 30-35%) with global hypokinesis.   Caronary artery calcification, aortic atherosclerosis, and aortic valve prosthesis are seen on the attenuation correction CT.   Small right pleural effusion is present. Asymmetric breast densities (right > left) are incidentally noted; clincial correlation recommended.   Compared to the prior study on 03/18/2018, the fixed apical defect is new.  LVEF has also decreased.   This is an intermediate to high risk study, primarily due to low LVEF.     Assessment/Plan 1.  Chronic renal insufficiency:  Patient no longer needs his tunneled catheter will be removed under local.  Risks and benefits of been reviewed all questions answered patient agrees to proceed.   [name], MD  08/04/2021 10:36 AM

## 2021-08-04 NOTE — H&P (View-Only) (Signed)
MRN : [id number]  Timothy Riley is a 81 y.o. (January 15, 1941) male who presents with chief complaint of remove tunneled catheter.  History of Present Illness:     Patient presents to [facility] at the request of nephrology for removal of his tunneled catheter.  Patient no longer needs the tunneled catheter.  He is denying fevers chills.  Current Meds  Medication Sig   amiodarone (PACERONE) 200 MG tablet Take 1 tablet (200 mg total) by mouth daily.   amoxicillin (AMOXIL) 500 MG capsule TAKE 4 CAPSULES 1 HOUR BEFORE DENTAL APPOINTMENT   carvedilol (COREG) 6.25 MG tablet Take 1 tablet (6.25 mg total) by mouth 2 (two) times daily with a meal.   Cholecalciferol (VITAMIN D3 PO) Take by mouth daily.   doxazosin (CARDURA) 8 MG tablet TAKE 1 TABLET BY MOUTH TWICE A DAY   ELIQUIS 2.5 MG TABS tablet TAKE 1 TABLET BY MOUTH TWICE A DAY   furosemide (LASIX) 80 MG tablet Take 1 tablet (80 mg total) by mouth daily.   glipiZIDE (GLUCOTROL) 10 MG tablet Take 1 tablet (10 mg total) by mouth daily before breakfast. Home med.   pioglitazone (ACTOS) 30 MG tablet TAKE 1 TABLET BY MOUTH ONCE DAILY   rosuvastatin (CRESTOR) 10 MG tablet Take 1 tablet (10 mg total) by mouth daily.    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 Glower MD @ [facility] via R thoracic approach; b. 03/2018 Echo: EF 60-65%, DD. Mildly dil LA. Nl fxn AoV prosthesis. Grad 69mHg.   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 12/2013 Cath ([facility]): LAD 30p, RI 60, otw nl ([place]; b. 03/2018 MV: EF 59%, no ischemia/scar.   Persistent atrial fibrillation ([place] 10/2017   a. CHA2DS2VASc = 5-->Eliquis; b. 12/2017 loaded w/amio-->DCCV.   PSVT (paroxysmal supraventricular tachycardia) ([facility]    a. 05/2014 s/p RFCA @ [facility].   Right inguinal hernia    Urinary incontinence     Past Surgical History:  Procedure Laterality Date   AORTIC VALVE REPLACEMENT     CARDIAC CATHETERIZATION     CARDIOVERSION N/A 01/01/2018   Procedure: CARDIOVERSION;  Surgeon: GMinna Merritts MD;  [place];  Service: Cardiovascular;  Laterality: N/A;   CARDIOVERSION N/A 05/16/2021   Procedure: CARDIOVERSION;  Surgeon: ENelva Bush MD;  [place];  Service: Cardiovascular;  Laterality: N/A;   CARDIOVERSION N/A 05/17/2021   Procedure: CARDIOVERSION;  Surgeon: AKate Sable MD;  Location: [facility];  Service: Cardiovascular;  Laterality: N/A;   COLONOSCOPY  2010   DIALYSIS/PERMA CATHETER INSERTION N/A 05/26/2021   Procedure: DIALYSIS/PERMA CATHETER INSERTION;  Surgeon: SKatha Cabal MD;  [place];  Service: Cardiovascular;  Laterality: N/A;   heart valve replacment  2015   TEMPORARY DIALYSIS CATHETER N/A 05/23/2021  Procedure: TEMPORARY DIALYSIS CATHETER;  Surgeon: Katha Cabal, MD;  Location: [place] [facility];  Service: Cardiovascular;  Laterality: N/A;   XI ROBOTIC ASSISTED INGUINAL HERNIA REPAIR WITH MESH Right 01/07/2019   Procedure: XI ROBOTIC ASSISTED INGUINAL HERNIA REPAIR WITH MESH;  Surgeon: Ronny Bacon, MD;  Location: [facility];  Service: General;  Laterality: Right;    Social History Social History   Tobacco Use   Smoking status: Former    Packs/day: 0.50    Years: 1.00    Total pack years: 0.50    Types: Cigarettes    Quit date: 01/30/1972    Years since quitting: 49.5    Smokeless tobacco: Never   Tobacco comments:    over 11 yrs ago  Vaping Use   Vaping Use: Never used  Substance Use Topics   Alcohol use: No    Alcohol/week: 0.0 standard drinks of alcohol   Drug use: No    Family History Family History  Problem Relation Age of Onset   Cancer Mother    Stroke Father     Allergies  Allergen Reactions   Gabapentin Other (See Comments) and Rash    dizzy dizzy   Hydralazine Anxiety and Rash   Hydralazine Hcl Rash     REVIEW OF SYSTEMS (Negative unless checked)  Constitutional: '[]'$ Weight loss  '[]'$ Fever  '[]'$ Chills Cardiac: '[]'$ Chest pain   '[]'$ Chest pressure   '[]'$ Palpitations   '[]'$ Shortness of breath when laying flat   '[]'$ Shortness of breath with exertion. Vascular:  '[]'$ Pain in legs with walking   '[]'$ Pain in legs at rest  '[]'$ History of DVT   '[]'$ Phlebitis   '[]'$ Swelling in legs   '[]'$ Varicose veins   '[]'$ Non-healing ulcers Pulmonary:   '[]'$ Uses home oxygen   '[]'$ Productive cough   '[]'$ Hemoptysis   '[]'$ Wheeze  '[]'$ COPD   '[]'$ Asthma Neurologic:  '[]'$ Dizziness   '[]'$ Seizures   '[]'$ History of stroke   '[]'$ History of TIA  '[]'$ Aphasia   '[]'$ Vissual changes   '[]'$ Weakness or numbness in arm   '[]'$ Weakness or numbness in leg Musculoskeletal:   '[]'$ Joint swelling   '[]'$ Joint pain   '[]'$ Low back pain Hematologic:  '[]'$ Easy bruising  '[]'$ Easy bleeding   '[]'$ Hypercoagulable state   '[]'$ Anemic Gastrointestinal:  '[]'$ Diarrhea   '[]'$ Vomiting  '[]'$ Gastroesophageal reflux/heartburn   '[]'$ Difficulty swallowing. Genitourinary:  '[x]'$ Chronic kidney disease   '[]'$ Difficult urination  '[]'$ Frequent urination   '[]'$ Blood in urine Skin:  '[]'$ Rashes   '[]'$ Ulcers  Psychological:  '[]'$ History of anxiety   '[]'$  History of major depression.  Physical Examination  Vitals:   08/04/21 0958  BP: (!) 165/90  Pulse: (!) 49  Resp: 18  Temp: 98.4 F (36.9 C)  TempSrc: Oral  SpO2: 100%   There is no height or weight on file to calculate BMI. Gen: WD/WN, NAD Head: [place]/AT, No temporalis wasting.  Ear/Nose/Throat: Hearing grossly intact, nares w/o erythema or  drainage Eyes: PER, EOMI, sclera nonicteric.  Neck: Supple, no gross masses or lesions.  No JVD.  Pulmonary:  Good air movement, no audible wheezing, no use of accessory muscles.  Cardiac: RRR, precordium non-hyperdynamic. Vascular:   Right IJ tunnel catheter clean dry and intact Vessel Right Left  Radial Palpable Palpable  Brachial Palpable Palpable  Gastrointestinal: soft, non-distended. No guarding/no peritoneal signs.  Musculoskeletal: M/S 5/5 throughout.  No deformity.  Neurologic: CN 2-12 intact. Pain and light touch intact in extremities.  Symmetrical.  Speech is fluent. Motor exam as listed above. Psychiatric: Judgment intact, Mood & affect appropriate for pt's clinical situation. Dermatologic: No rashes or  ulcers noted.  No changes consistent with cellulitis.   CBC Lab Results  Component Value Date   WBC 6.9 05/28/2021   HGB 10.7 (L) 05/28/2021   HCT 34.1 (L) 05/28/2021   MCV 88.8 05/28/2021   PLT 154 05/28/2021    BMET    Component Value Date/Time   NA 140 05/28/2021 0417   NA 148 (H) 05/09/2021 1135   NA 140 05/28/2014 1725   K 3.6 05/28/2021 0417   K 3.8 05/28/2014 1725   CL 101 05/28/2021 0417   CL 107 05/28/2014 1725   CO2 28 05/28/2021 0417   CO2 26 05/28/2014 1725   GLUCOSE 150 (H) 05/28/2021 0417   GLUCOSE 160 (H) 05/28/2014 1725   BUN 26 (H) 05/28/2021 0417   BUN 37 (H) 05/09/2021 1135   BUN 25 (H) 05/28/2014 1725   CREATININE 1.82 (H) 05/28/2021 0417   CREATININE 2.57 (H) 12/23/2018 0000   CALCIUM 8.6 (L) 05/28/2021 0417   CALCIUM 9.0 05/28/2014 1725   GFRNONAA 37 (L) 05/28/2021 0417   GFRNONAA 23 (L) 12/23/2018 0000   GFRAA 26 (L) 12/23/2018 0000   CrCl cannot be calculated (Patient's most recent lab result is older than the maximum 21 days allowed.).  COAG Lab Results  Component Value Date   INR 2.1 (H) 05/16/2021   INR 1.4 (H) 12/31/2018    Radiology NM Myocar Multi W/Spect W/Wall Motion / EF  Result Date: 07/12/2021   Abnormal  pharmacologic myocardial perfusion stress test.   There is a moderate in size, moderate in severity, fixed apical inferior, apical lateral, and apical defect most consistent with scar but cannot rule out artifact.   There is no evidence of significant ischemia.   Left ventricular systolic function is moderately to severely reduced (LVEF 30-35%) with global hypokinesis.   Caronary artery calcification, aortic atherosclerosis, and aortic valve prosthesis are seen on the attenuation correction CT.   Small right pleural effusion is present. Asymmetric breast densities (right > left) are incidentally noted; clincial correlation recommended.   Compared to the prior study on 03/18/2018, the fixed apical defect is new.  LVEF has also decreased.   This is an intermediate to high risk study, primarily due to low LVEF.     Assessment/Plan 1.  Chronic renal insufficiency:  Patient no longer needs his tunneled catheter will be removed under local.  Risks and benefits of been reviewed all questions answered patient agrees to proceed.   [name], MD  08/04/2021 10:36 AM

## 2021-08-04 NOTE — Interval H&P Note (Signed)
History and Physical Interval Note:  08/04/2021 10:42 AM  Timothy Riley  has presented today for surgery, with the diagnosis of Perma Cath Removal   End Stage Renal.  The various methods of treatment have been discussed with the patient and family. After consideration of risks, benefits and other options for treatment, the patient has consented to  Procedure(s): DIALYSIS/PERMA CATHETER REMOVAL (N/A) as a surgical intervention.  The patient's history has been reviewed, patient examined, no change in status, stable for surgery.  I have reviewed the patient's chart and labs.  Questions were answered to the patient's satisfaction.     [name]

## 2021-08-12 ENCOUNTER — Observation Stay
Admission: EM | Admit: 2021-08-12 | Discharge: 2021-08-13 | Disposition: A | Discharge status: Home / Self Care | Payer: Medicare Other | Attending: Internal Medicine | Admitting: Internal Medicine

## 2021-08-12 ENCOUNTER — Emergency Department: Payer: Medicare Other

## 2021-08-12 ENCOUNTER — Other Ambulatory Visit: Payer: Self-pay

## 2021-08-12 DIAGNOSIS — Z992 Dependence on renal dialysis: Secondary | ICD-10-CM | POA: Insufficient documentation

## 2021-08-12 DIAGNOSIS — E1169 Type 2 diabetes mellitus with other specified complication: Secondary | ICD-10-CM | POA: Diagnosis not present

## 2021-08-12 DIAGNOSIS — R55 Syncope and collapse: Principal | ICD-10-CM | POA: Insufficient documentation

## 2021-08-12 DIAGNOSIS — I480 Paroxysmal atrial fibrillation: Secondary | ICD-10-CM | POA: Diagnosis present

## 2021-08-12 DIAGNOSIS — I2510 Atherosclerotic heart disease of native coronary artery without angina pectoris: Secondary | ICD-10-CM | POA: Insufficient documentation

## 2021-08-12 DIAGNOSIS — Z20822 Contact with and (suspected) exposure to covid-19: Secondary | ICD-10-CM | POA: Diagnosis not present

## 2021-08-12 DIAGNOSIS — N179 Acute kidney failure, unspecified: Secondary | ICD-10-CM | POA: Insufficient documentation

## 2021-08-12 DIAGNOSIS — Z79899 Other long term (current) drug therapy: Secondary | ICD-10-CM | POA: Insufficient documentation

## 2021-08-12 DIAGNOSIS — Z7984 Long term (current) use of oral hypoglycemic drugs: Secondary | ICD-10-CM | POA: Insufficient documentation

## 2021-08-12 DIAGNOSIS — E1122 Type 2 diabetes mellitus with diabetic chronic kidney disease: Secondary | ICD-10-CM | POA: Insufficient documentation

## 2021-08-12 DIAGNOSIS — I129 Hypertensive chronic kidney disease with stage 1 through stage 4 chronic kidney disease, or unspecified chronic kidney disease: Secondary | ICD-10-CM | POA: Diagnosis not present

## 2021-08-12 DIAGNOSIS — Z87891 Personal history of nicotine dependence: Secondary | ICD-10-CM | POA: Insufficient documentation

## 2021-08-12 DIAGNOSIS — Z7901 Long term (current) use of anticoagulants: Secondary | ICD-10-CM | POA: Insufficient documentation

## 2021-08-12 DIAGNOSIS — E11649 Type 2 diabetes mellitus with hypoglycemia without coma: Secondary | ICD-10-CM | POA: Diagnosis not present

## 2021-08-12 DIAGNOSIS — Z85828 Personal history of other malignant neoplasm of skin: Secondary | ICD-10-CM | POA: Insufficient documentation

## 2021-08-12 DIAGNOSIS — R001 Bradycardia, unspecified: Secondary | ICD-10-CM | POA: Diagnosis present

## 2021-08-12 DIAGNOSIS — I4819 Other persistent atrial fibrillation: Secondary | ICD-10-CM | POA: Diagnosis not present

## 2021-08-12 DIAGNOSIS — E785 Hyperlipidemia, unspecified: Secondary | ICD-10-CM | POA: Diagnosis not present

## 2021-08-12 DIAGNOSIS — N184 Chronic kidney disease, stage 4 (severe): Secondary | ICD-10-CM | POA: Diagnosis not present

## 2021-08-12 DIAGNOSIS — R4182 Altered mental status, unspecified: Secondary | ICD-10-CM | POA: Diagnosis present

## 2021-08-12 DIAGNOSIS — Z952 Presence of prosthetic heart valve: Secondary | ICD-10-CM | POA: Diagnosis not present

## 2021-08-12 DIAGNOSIS — N1832 Chronic kidney disease, stage 3b: Secondary | ICD-10-CM | POA: Diagnosis present

## 2021-08-12 LAB — URINALYSIS, COMPLETE (UACMP) WITH MICROSCOPIC
Bilirubin Urine: NEGATIVE
Glucose, UA: 50 mg/dL — AB
Hgb urine dipstick: NEGATIVE
Ketones, ur: NEGATIVE mg/dL
Leukocytes,Ua: NEGATIVE
Nitrite: NEGATIVE
Protein, ur: NEGATIVE mg/dL
Specific Gravity, Urine: 1.005 (ref 1.005–1.030)
pH: 5 (ref 5.0–8.0)

## 2021-08-12 LAB — TSH: TSH: 5.608 u[IU]/mL — ABNORMAL HIGH (ref 0.350–4.500)

## 2021-08-12 LAB — URINE DRUG SCREEN, QUALITATIVE (ARMC ONLY)
Amphetamines, Ur Screen: NOT DETECTED
Barbiturates, Ur Screen: NOT DETECTED
Benzodiazepine, Ur Scrn: NOT DETECTED
Cannabinoid 50 Ng, Ur ~~LOC~~: NOT DETECTED
Cocaine Metabolite,Ur ~~LOC~~: NOT DETECTED
MDMA (Ecstasy)Ur Screen: NOT DETECTED
Methadone Scn, Ur: NOT DETECTED
Opiate, Ur Screen: NOT DETECTED
Phencyclidine (PCP) Ur S: NOT DETECTED
Tricyclic, Ur Screen: NOT DETECTED

## 2021-08-12 LAB — COMPREHENSIVE METABOLIC PANEL
ALT: 20 U/L (ref 0–44)
AST: 32 U/L (ref 15–41)
Albumin: 3.6 g/dL (ref 3.5–5.0)
Alkaline Phosphatase: 73 U/L (ref 38–126)
Anion gap: 6 (ref 5–15)
BUN: 48 mg/dL — ABNORMAL HIGH (ref 8–23)
CO2: 26 mmol/L (ref 22–32)
Calcium: 8.7 mg/dL — ABNORMAL LOW (ref 8.9–10.3)
Chloride: 106 mmol/L (ref 98–111)
Creatinine, Ser: 2.56 mg/dL — ABNORMAL HIGH (ref 0.61–1.24)
GFR, Estimated: 24 mL/min — ABNORMAL LOW (ref >60)
Glucose, Bld: 193 mg/dL — ABNORMAL HIGH (ref 70–99)
Potassium: 3.6 mmol/L (ref 3.5–5.1)
Sodium: 138 mmol/L (ref 135–145)
Total Bilirubin: 0.6 mg/dL (ref 0.3–1.2)
Total Protein: 6.5 g/dL (ref 6.5–8.1)

## 2021-08-12 LAB — CBC
HCT: 31.6 % — ABNORMAL LOW (ref 39.0–52.0)
Hemoglobin: 10.5 g/dL — ABNORMAL LOW (ref 13.0–17.0)
MCH: 29.7 pg (ref 26.0–34.0)
MCHC: 33.2 g/dL (ref 30.0–36.0)
MCV: 89.5 fL (ref 80.0–100.0)
Platelets: 156 10*3/uL (ref 150–400)
RBC: 3.53 MIL/uL — ABNORMAL LOW (ref 4.22–5.81)
RDW: 16.3 % — ABNORMAL HIGH (ref 11.5–15.5)
WBC: 4.1 10*3/uL (ref 4.0–10.5)
nRBC: 0 % (ref 0.0–0.2)

## 2021-08-12 LAB — CBG MONITORING, ED
Glucose-Capillary: 190 mg/dL — ABNORMAL HIGH (ref 70–99)
Glucose-Capillary: 95 mg/dL (ref 70–99)
Glucose-Capillary: 69 mg/dL — ABNORMAL LOW (ref 70–99)
Glucose-Capillary: 107 mg/dL — ABNORMAL HIGH (ref 70–99)

## 2021-08-12 LAB — RESP PANEL BY RT-PCR (FLU A&B, COVID) ARPGX2
Influenza A by PCR: NEGATIVE
Influenza B by PCR: NEGATIVE
SARS Coronavirus 2 by RT PCR: NEGATIVE

## 2021-08-12 LAB — TROPONIN I (HIGH SENSITIVITY)
Troponin I (High Sensitivity): 18 ng/L — ABNORMAL HIGH (ref <18)
Troponin I (High Sensitivity): 17 ng/L (ref <18)

## 2021-08-12 LAB — AMMONIA: Ammonia: 19 umol/L (ref 9–35)

## 2021-08-12 LAB — ETHANOL: Alcohol, Ethyl (B): <10 mg/dL (ref <10)

## 2021-08-12 MED ORDER — CARVEDILOL 6.25 MG PO TABS: 6.2500 mg | ORAL_TABLET | Freq: Two times a day (BID) | ORAL | Status: DC

## 2021-08-12 MED ORDER — ROSUVASTATIN CALCIUM 20 MG PO TABS
10.0000 mg | ORAL_TABLET | Freq: Every day | ORAL | Status: DC
  Administered 2021-08-13: 10 mg via ORAL
  Filled 2021-08-12 (×2): qty 1

## 2021-08-12 MED ORDER — APIXABAN 2.5 MG PO TABS
2.5000 mg | ORAL_TABLET | Freq: Two times a day (BID) | ORAL | Status: DC
  Administered 2021-08-12 – 2021-08-13 (×2): 2.5 mg via ORAL
  Filled 2021-08-12 (×2): qty 1

## 2021-08-12 MED ORDER — PIOGLITAZONE HCL 30 MG PO TABS
30.0000 mg | ORAL_TABLET | Freq: Every day | ORAL | Status: DC
  Administered 2021-08-13: 30 mg via ORAL
  Filled 2021-08-12: qty 1

## 2021-08-12 MED ORDER — ENOXAPARIN SODIUM 30 MG/0.3ML IJ SOSY: 30.0000 mg | PREFILLED_SYRINGE | INTRAMUSCULAR | Status: DC

## 2021-08-12 MED ORDER — ACETAMINOPHEN 325 MG PO TABS: 650.0000 mg | ORAL_TABLET | Freq: Four times a day (QID) | ORAL | Status: DC | PRN

## 2021-08-12 MED ORDER — MAGNESIUM HYDROXIDE 400 MG/5ML PO SUSP: 30.0000 mL | Freq: Every day | ORAL | Status: DC | PRN

## 2021-08-12 MED ORDER — AMLODIPINE BESYLATE 5 MG PO TABS
10.0000 mg | ORAL_TABLET | Freq: Once | ORAL | Status: AC
  Administered 2021-08-12: 10 mg via ORAL
  Filled 2021-08-12: qty 2

## 2021-08-12 MED ORDER — ONDANSETRON HCL 4 MG PO TABS: 4.0000 mg | ORAL_TABLET | Freq: Four times a day (QID) | ORAL | Status: DC | PRN

## 2021-08-12 MED ORDER — ONDANSETRON HCL 4 MG/2ML IJ SOLN: 4.0000 mg | Freq: Four times a day (QID) | INTRAMUSCULAR | Status: DC | PRN

## 2021-08-12 MED ORDER — ACETAMINOPHEN 650 MG RE SUPP: 650.0000 mg | Freq: Four times a day (QID) | RECTAL | Status: DC | PRN

## 2021-08-12 MED ORDER — INSULIN ASPART 100 UNIT/ML IJ SOLN: 0.0000 [IU] | Freq: Three times a day (TID) | INTRAMUSCULAR | Status: DC

## 2021-08-12 MED ORDER — SODIUM CHLORIDE 0.9 % IV BOLUS
500.0000 mL | Freq: Once | INTRAVENOUS | Status: AC
  Administered 2021-08-12: 500 mL via INTRAVENOUS

## 2021-08-12 MED ORDER — INSULIN ASPART 100 UNIT/ML IJ SOLN: 0.0000 [IU] | Freq: Every day | INTRAMUSCULAR | Status: DC

## 2021-08-12 MED ORDER — AMIODARONE HCL 200 MG PO TABS
200.0000 mg | ORAL_TABLET | Freq: Every day | ORAL | Status: DC
  Administered 2021-08-13: 200 mg via ORAL
  Filled 2021-08-12: qty 1

## 2021-08-12 MED ORDER — TRAZODONE HCL 50 MG PO TABS: 25.0000 mg | ORAL_TABLET | Freq: Every evening | ORAL | Status: DC | PRN

## 2021-08-12 MED ORDER — ENOXAPARIN SODIUM 40 MG/0.4ML IJ SOSY: 40.0000 mg | PREFILLED_SYRINGE | INTRAMUSCULAR | Status: DC

## 2021-08-12 MED ORDER — SODIUM CHLORIDE 0.9 % IV SOLN: INTRAVENOUS | Status: DC

## 2021-08-12 MED ORDER — VITAMIN D3 25 MCG (1000 UNIT) PO TABS
1000.0000 [IU] | ORAL_TABLET | Freq: Every day | ORAL | Status: DC
  Administered 2021-08-13: 1000 [IU] via ORAL
  Filled 2021-08-12 (×2): qty 1

## 2021-08-12 MED ORDER — DOXAZOSIN MESYLATE 4 MG PO TABS
8.0000 mg | ORAL_TABLET | Freq: Two times a day (BID) | ORAL | Status: DC
  Administered 2021-08-12 – 2021-08-13 (×2): 8 mg via ORAL
  Filled 2021-08-12: qty 1
  Filled 2021-08-12 (×2): qty 2

## 2021-08-12 NOTE — ED Notes (Signed)
Pt provided with 8oz of orange juice and a [place] sandwich tray at this time

## 2021-08-12 NOTE — Progress Notes (Signed)
PHARMACIST - PHYSICIAN COMMUNICATION  CONCERNING:  Enoxaparin (Lovenox) for DVT Prophylaxis    RECOMMENDATION: Patient was prescribed enoxaprin '40mg'$  q24 hours for VTE prophylaxis.   Filed Weights   08/12/21 1615  Weight: 83.5 kg (184 lb 1.4 oz)    Body mass index is 27.99 kg/m.  Estimated Creatinine Clearance: 23.4 mL/min (A) (by C-G formula based on SCr of 2.56 mg/dL (H)).  Patient is candidate for enoxaparin '30mg'$  every 24 hours based on CrCl <28m/min or Weight <45kg  DESCRIPTION: [facility]  Patient is now receiving enoxaparin 30 mg every 24 hours    LVira Blanco PharmD Clinical Pharmacist  08/12/2021 7:42 PM

## 2021-08-12 NOTE — Assessment & Plan Note (Addendum)
As above.  Glipizide and Actos discontinued.  Outpatient follow-up.

## 2021-08-12 NOTE — Assessment & Plan Note (Signed)
Coreg discontinued.  Patient will follow-up with cardiology as outpatient.  Already improved after not receiving morning dose.

## 2021-08-12 NOTE — Assessment & Plan Note (Signed)
Likely hypoglycemia and possibly bradycardia as well.  Discussed with cardiology and patient cleared.  Coreg discontinued and patient will follow-up with cardiology.  Patient's previous A1c's have been elevated in the past, but most recent 1 in February was at 5.4.  Rechecked today at 5.6.  Recommended patient discontinue his glipizide and Actos and follow-up with his PCP.
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
## 2021-08-12 NOTE — ED Notes (Signed)
Pt is alert and oriented times 4 at this time. With zero complaints of pain noted. Pt on the monitor and urinal emptied

## 2021-08-12 NOTE — ED Notes (Signed)
MD notified BGL is 95, also notified pt's HR is still in the low 40's, and BP is 381'R systolic.

## 2021-08-12 NOTE — ED Notes (Signed)
Pt. To CT
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
## 2021-08-12 NOTE — Assessment & Plan Note (Signed)
-   We will continue his amiodarone and Eliquis.

## 2021-08-12 NOTE — Assessment & Plan Note (Addendum)
Patient's baseline looks to be somewhere between stage IIIb-IV.  With IV fluids, creatinine improved from 2.56 on admission to 2.25 this morning.
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
## 2021-08-12 NOTE — Assessment & Plan Note (Signed)
-   We will continue statin therapy. 

## 2021-08-12 NOTE — ED Notes (Signed)
MD aware pt's HR is low 40's. Amd BP is 170/50.

## 2021-08-13 DIAGNOSIS — I480 Paroxysmal atrial fibrillation: Secondary | ICD-10-CM

## 2021-08-13 DIAGNOSIS — N179 Acute kidney failure, unspecified: Secondary | ICD-10-CM | POA: Diagnosis not present

## 2021-08-13 DIAGNOSIS — R4182 Altered mental status, unspecified: Secondary | ICD-10-CM | POA: Diagnosis not present

## 2021-08-13 DIAGNOSIS — I429 Cardiomyopathy, unspecified: Secondary | ICD-10-CM | POA: Diagnosis not present

## 2021-08-13 DIAGNOSIS — R55 Syncope and collapse: Secondary | ICD-10-CM | POA: Diagnosis not present

## 2021-08-13 DIAGNOSIS — R001 Bradycardia, unspecified: Secondary | ICD-10-CM | POA: Diagnosis not present

## 2021-08-13 LAB — CBG MONITORING, ED
Glucose-Capillary: 99 mg/dL (ref 70–99)
Glucose-Capillary: 124 mg/dL — ABNORMAL HIGH (ref 70–99)

## 2021-08-13 LAB — BASIC METABOLIC PANEL
Anion gap: 5 (ref 5–15)
BUN: 41 mg/dL — ABNORMAL HIGH (ref 8–23)
CO2: 29 mmol/L (ref 22–32)
Calcium: 8.5 mg/dL — ABNORMAL LOW (ref 8.9–10.3)
Chloride: 108 mmol/L (ref 98–111)
Creatinine, Ser: 2.25 mg/dL — ABNORMAL HIGH (ref 0.61–1.24)
GFR, Estimated: 28 mL/min — ABNORMAL LOW (ref >60)
Glucose, Bld: 142 mg/dL — ABNORMAL HIGH (ref 70–99)
Potassium: 3.4 mmol/L — ABNORMAL LOW (ref 3.5–5.1)
Sodium: 142 mmol/L (ref 135–145)

## 2021-08-13 LAB — CBC
HCT: 32.3 % — ABNORMAL LOW (ref 39.0–52.0)
Hemoglobin: 10.6 g/dL — ABNORMAL LOW (ref 13.0–17.0)
MCH: 29.1 pg (ref 26.0–34.0)
MCHC: 32.8 g/dL (ref 30.0–36.0)
MCV: 88.7 fL (ref 80.0–100.0)
Platelets: 160 10*3/uL (ref 150–400)
RBC: 3.64 MIL/uL — ABNORMAL LOW (ref 4.22–5.81)
RDW: 16 % — ABNORMAL HIGH (ref 11.5–15.5)
WBC: 4.5 10*3/uL (ref 4.0–10.5)
nRBC: 0 % (ref 0.0–0.2)

## 2021-08-13 LAB — HEMOGLOBIN A1C
Hgb A1c MFr Bld: 5.6 % (ref 4.8–5.6)
Mean Plasma Glucose: 114.02 mg/dL

## 2021-08-13 MED ORDER — FUROSEMIDE 40 MG PO TABS
20.0000 mg | ORAL_TABLET | Freq: Every day | ORAL | Status: DC
  Administered 2021-08-13: 20 mg via ORAL
  Filled 2021-08-13: qty 1

## 2021-08-13 MED ORDER — FUROSEMIDE 40 MG PO TABS
80.0000 mg | ORAL_TABLET | Freq: Every day | ORAL | Status: DC
  Administered 2021-08-13: 80 mg via ORAL
  Filled 2021-08-13: qty 2

## 2021-08-13 NOTE — ED Notes (Signed)
Patient is resting comfortably. Pt is on cardiac monitor

## 2021-08-13 NOTE — ED Notes (Signed)
Pt yelling out that he wants to go home. Bed alarm activated. Pt informed that he needs to stay due to admission.

## 2021-08-13 NOTE — Discharge Summary (Addendum)
Physician Discharge Summary   Patient: Timothy Riley MRN: [id number] DOB: 1940-11-16  Admit date:     08/12/2021  Discharge date: 08/13/21  Discharge Physician: Annita Brod   PCP: Derinda Late, MD   Recommendations at discharge:   Patient will follow-up with his cardiologist in the next 2 weeks Medication change: Coreg discontinued Medication change: Glipizide discontinued Medication change: Actos discontinued  Discharge Diagnoses: Principal Problem:   Syncope Active Problems:   Uncontrolled type 2 diabetes mellitus with hypoglycemia, without long-term current use of insulin (HCC)   Symptomatic bradycardia   Acute renal failure superimposed on stage 3b-4 chronic kidney disease (HCC)   Paroxysmal atrial fibrillation ([place])   Dyslipidemia  Resolved Problems:   * No resolved hospital problems. *  Hospital Course: 81 year old Caucasian male with past medical history of stage III chronic kidney disease, PSVT, CAD, hypertension and diabetes mellitus who was brought in by paramedics after he was found to be in a decreased date of consciousness after veering all over the road.  Patient has little memory of this.  When being evaluated, found to have a heart rate of 32 and glucose of 63.  Brought into the emergency room and found to have acute kidney injury with creatinine of 2.56 (creatinine was 1.82 3 months ago).  Urine drug screen and alcohol level normal.  Patient given some D5 in his fluids.  By 7/16, feeling better and alert and oriented x4.  Seen by cardiology and cleared.  Coreg has been held since arriving to the emergency room and heart rate had improved into the 50s.  Assessment and Plan: * Syncope Likely hypoglycemia and possibly bradycardia as well.  Discussed with cardiology and patient cleared.  Coreg discontinued and patient will follow-up with cardiology.  Patient's previous A1c's have been elevated in the past, but most recent 1 in February was at 5.4.  Rechecked  today at 5.6.  Recommended patient discontinue his glipizide and Actos and follow-up with his PCP.  Uncontrolled type 2 diabetes mellitus with hypoglycemia, without long-term current use of insulin ([place]) As above.  Glipizide and Actos discontinued.  Outpatient follow-up.  Symptomatic bradycardia Coreg discontinued.  Patient will follow-up with cardiology as outpatient.  Already improved after not receiving morning dose.  Acute renal failure superimposed on stage 3b-4 chronic kidney disease ([place]) Patient's baseline looks to be somewhere between stage IIIb-IV.  With IV fluids, creatinine improved from 2.56 on admission to 2.25 this morning.  Dyslipidemia - We will continue statin therapy.  Paroxysmal atrial fibrillation (HCC) - We will continue his amiodarone and Eliquis.         Consultants: Cardiology Procedures performed: None Disposition: Home Diet recommendation:  Discharge Diet Orders (From admission, onward)     Start     Ordered   08/13/21 0000  Diet - low sodium heart healthy        08/13/21 1249           Cardiac and Carb modified diet DISCHARGE MEDICATION: Allergies as of 08/13/2021       Reactions   Gabapentin Other (See Comments), Rash   dizzy dizzy   Hydralazine Anxiety, Rash   Hydralazine Hcl Rash        Medication List     STOP taking these medications    carvedilol 6.25 MG tablet Commonly known as: COREG   glipiZIDE 10 MG tablet Commonly known as: GLUCOTROL   pioglitazone 30 MG tablet Commonly known as: ACTOS       TAKE  these medications    amiodarone 200 MG tablet Commonly known as: PACERONE Take 1 tablet (200 mg total) by mouth daily.   doxazosin 8 MG tablet Commonly known as: CARDURA TAKE 1 TABLET BY MOUTH TWICE A DAY   Eliquis 2.5 MG Tabs tablet Generic drug: apixaban TAKE 1 TABLET BY MOUTH TWICE A DAY   furosemide 80 MG tablet Commonly known as: LASIX Take 1 tablet (80 mg total) by mouth daily.   rosuvastatin 10  MG tablet Commonly known as: CRESTOR Take 1 tablet (10 mg total) by mouth daily.   VITAMIN D3 PO Take by mouth daily.        Follow-up Information     Gollan, Kathlene November, MD. Schedule an appointment as soon as possible for a visit in 2 week(s).   Specialty: Cardiology Contact information: [place] [address] [phone]                Discharge Exam: Danley Danker Weights   08/12/21 1615  Weight: 83.5 kg   General: Alert and oriented x4, no acute distress Cardiovascular: Regular rhythm, S1, S2, mild bradycardia Lungs: Clear to auscultation bilaterally  Condition at discharge: good  The results of significant diagnostics from this hospitalization (including imaging, microbiology, ancillary and laboratory) are listed below for reference.   Imaging Studies: CT HEAD WO CONTRAST (5MM)  Result Date: 08/12/2021 CLINICAL DATA:  Mental status change. EXAM: CT HEAD WITHOUT CONTRAST TECHNIQUE: Contiguous axial images were obtained from the base of the skull through the vertex without intravenous contrast. RADIATION DOSE REDUCTION: This exam was performed according to the departmental dose-optimization program which includes automated exposure control, adjustment of the mA and/or kV according to patient size and/or use of iterative reconstruction technique. COMPARISON:  None Available. FINDINGS: Brain: No subdural, epidural, or subarachnoid hemorrhage. Cerebellum, brainstem, and basal cisterns are normal. Ventricles and sulci are prominent. No mass effect or midline shift. No acute cortical ischemia or infarct. Vascular: Calcified atherosclerotic changes identified in the intracranial carotids. Skull: Normal. Negative for fracture or focal lesion. Sinuses/Orbits: No acute finding. Other: None. IMPRESSION: No acute intracranial abnormalities. Electronically Signed   By: Dorise Bullion III M.D.   On: 08/12/2021 17:40   PERIPHERAL VASCULAR  CATHETERIZATION  Result Date: 08/04/2021 See surgical note for result.   Microbiology: Results for orders placed or performed during the hospital encounter of 08/12/21  Resp Panel by RT-PCR (Flu A&B, Covid)     Status: None   Collection Time: 08/12/21  4:21 PM   Specimen: Nasal Swab  Result Value Ref Range Status   SARS Coronavirus 2 by RT PCR NEGATIVE NEGATIVE Final    Comment: (NOTE) SARS-CoV-2 target nucleic acids are NOT DETECTED.  The SARS-CoV-2 RNA is generally detectable in upper respiratory specimens during the acute phase of infection. The lowest concentration of SARS-CoV-2 viral copies this assay can detect is 138 copies/mL. A negative result does not preclude SARS-Cov-2 infection and should not be used as the sole basis for treatment or other patient management decisions. A negative result may occur with  improper specimen collection/handling, submission of specimen other than nasopharyngeal swab, presence of viral mutation(s) within the areas targeted by this assay, and inadequate number of viral copies(<138 copies/mL). A negative result must be combined with clinical observations, patient history, and epidemiological information. The expected result is Negative.  Fact Sheet for Patients:  EntrepreneurPulse.com.au  Fact Sheet for Healthcare Providers:  IncredibleEmployment.be  This test is no t yet approved or  cleared by the [place] and  has been authorized for detection and/or diagnosis of SARS-CoV-2 by FDA under an Emergency Use Authorization (EUA). This EUA will remain  in effect (meaning this test can be used) for the duration of the COVID-19 declaration under Section 564(b)(1) of the Act, 21 U.S.C.section 360bbb-3(b)(1), unless the authorization is terminated  or revoked sooner.       Influenza A by PCR NEGATIVE NEGATIVE Final   Influenza B by PCR NEGATIVE NEGATIVE Final    Comment: (NOTE) The Xpert Xpress  SARS-CoV-2/FLU/RSV plus assay is intended as an aid in the diagnosis of influenza from Nasopharyngeal swab specimens and should not be used as a sole basis for treatment. Nasal washings and aspirates are unacceptable for Xpert Xpress SARS-CoV-2/FLU/RSV testing.  Fact Sheet for Patients: EntrepreneurPulse.com.au  Fact Sheet for Healthcare Providers: IncredibleEmployment.be  This test is not yet approved or cleared by the [place] FDA and has been authorized for detection and/or diagnosis of SARS-CoV-2 by FDA under an Emergency Use Authorization (EUA). This EUA will remain in effect (meaning this test can be used) for the duration of the COVID-19 declaration under Section 564(b)(1) of the Act, 21 U.S.C. section 360bbb-3(b)(1), unless the authorization is terminated or revoked.  Performed at [facility], [address] [place], [place] [address]     Labs: CBC: Recent Labs  Lab 08/12/21 1610 08/13/21 0511  WBC 4.1 4.5  HGB 10.5* 10.6*  HCT 31.6* 32.3*  MCV 89.5 88.7  PLT 156 088   Basic Metabolic Panel: Recent Labs  Lab 08/12/21 1610 08/13/21 0511  NA 138 142  K 3.6 3.4*  CL 106 108  CO2 26 29  GLUCOSE 193* 142*  BUN 48* 41*  CREATININE 2.56* 2.25*  CALCIUM 8.7* 8.5*   Liver Function Tests: Recent Labs  Lab 08/12/21 1610  AST 32  ALT 20  ALKPHOS 73  BILITOT 0.6  PROT 6.5  ALBUMIN 3.6   CBG: Recent Labs  Lab 08/12/21 1709 08/12/21 2210 08/12/21 2241 08/13/21 0720 08/13/21 1159  GLUCAP 95 69* 107* 99 124*    Discharge time spent: less than 30 minutes.  Signed: Annita Brod, MD [facility] 08/13/2021

## 2021-08-13 NOTE — Hospital Course (Signed)
81 year old Caucasian male with past medical history of stage III chronic kidney disease, PSVT, CAD, hypertension and diabetes mellitus who was brought in by paramedics after he was found to be in a decreased date of consciousness after veering all over the road.  Patient has little memory of this.  When being evaluated, found to have a heart rate of 32 and glucose of 63.  Brought into the emergency room and found to have acute kidney injury with creatinine of 2.56 (creatinine was 1.82 3 months ago).  Urine drug screen and alcohol level normal.  Patient given some D5 in his fluids.  By 7/16, feeling better and alert and oriented x4.  Seen by cardiology and cleared.  Coreg has been held since arriving to the emergency room and heart rate had improved into the 50s.

## 2021-08-13 NOTE — Progress Notes (Signed)
Patient discharged accompanied by son who will drive him home. VSS. A+Ox4. Ambulating and steady. Medications and discharge instructions reviewed.  All questions answered. PIV x2 removed, no bleeding, intact. Patient agreed to follow up with Dr. Rockey Situ, cardiologist, in two weeks. Patient satisfied with overall care at [facility].

## 2021-08-13 NOTE — ED Notes (Signed)
Pt is asleep at this time. On the monitor
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
## 2021-08-14 NOTE — Progress Notes (Signed)
Cardiology Office Note    Date:  08/15/2021   ID:  Timothy Riley, DOB August 04, 1940, MRN [id number]  PCP:  Kandyce Rud, MD  Cardiologist:  Julien Nordmann, MD  Electrophysiologist:  None   Chief Complaint: Hospital follow-up  History of Present Illness:   Timothy Riley is a 81 y.o. male with history of nonobstructive CAD by [facility] in 2015 with further details being unavailable in Care Everywhere, HFrEF, persistent A-fib status post DCCV in 04/2021, severe aortic stenosis status post AVR in 2016, SVT status post ablation in 2016, ESRD previously on HD, DM2, HTN, and prior tobacco use who presents for hospital follow-up as outlined below.  He was previously followed by [facility] cardiology.  Echo in 2018 demonstrated an EF of 55 to 65%, mild to moderate LVH, hypokinesis of the anteroseptal myocardium, grade 1 diastolic dysfunction, bioprosthetic aortic valve, mildly dilated left atrium, normal RV systolic function and PASP.  Outpatient cardiac monitoring in 2019 showed a 100% A-fib burden with 1 run of NSVT lasting 14.9 seconds.  Nuclear stress test in 03/2018 was low risk and showed no evidence of ischemia or scar.  Echo in 2022 demonstrated an EF of 50 to 55%, grade 2 diastolic dysfunction, normal RV systolic function and ventricular cavity size, moderately elevated PASP, moderately dilated left atrium, mildly dilated right atrium, mild mitral vegetation, bioprosthetic aortic valve with mild to moderate regurgitation.  He was admitted to the hospital in 04/2021 with volume overload.  Echo showed a newly reduced EF of 30 to 35%, global hypokinesis, moderately dilated LV internal cavity size, moderately reduced RV systolic function with moderately large RV cavity size, PASP 45 mmHg, moderately dilated left atrium, moderate mitral regurgitation, moderate tricuspid regurgitation, bioprosthetic aortic valve without evidence of regurgitation or stenosis, and an estimated right atrial pressure of 15 mmHg.  He  underwent successful DCCV.  He underwent repeat limited echo during the admission which showed a persistent cardiomyopathy with an EF of 30% and improved PA pressure of 38.4 mmHg.  Admission was complicated by acute renal failure requiring hemodialysis.  He was seen in hospital follow-up by his primary cardiologist in 05/2021 and was maintaining sinus rhythm.  Subsequent Lexiscan MPI on 07/11/2021 showed a moderate in size, moderate in severity, fixed apical inferior, apical lateral, and apical defect most consistent with scar versus artifact.  There was no evidence of significant ischemia.  LVEF 30 to 35% with global hypokinesis.  Coronary artery calcification, aortic atherosclerosis, and aortic valve prosthesis were noted on CT attenuated corrected images.  A small right pleural effusion was present.  Continued medical therapy was recommended, particularly in light of the patient's underlying renal dysfunction.  He was admitted to the hospital from 7/15 through 7/16 with decreased state of consciousness with concern for possible syncope after noted to be veering on the road.  He was found to have a heart rate of 32 bpm and a glucose of 63 in the field.  Initial EKG showed sinus bradycardia with a rate of 47 bpm.  High-sensitivity troponin 18 with a delta of 17.  It was recommended carvedilol be held.  He comes in today and is without symptoms of angina or decompensation.  He has not had any further episodes as outlined above.  Heart rate is in the 60s bpm today.  He remains off carvedilol given bradycardia as well as glipizide and pioglitazone given hypoglycemia.  He no longer requires hemodialysis.  No significant lower extremity swelling.  He feels like he  is back to baseline.   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 Glower MD @ [facility] via R thoracic approach; b. 03/2018 Echo: EF 60-65%, DD. Mildly dil LA. Nl fxn AoV prosthesis. Grad .   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 12/2013 Cath ([facility]): LAD 30p, RI 60, otw nl (per CT surgery note from [facility]); b. 03/2018 MV: EF 59%, no ischemia/scar.   Persistent atrial fibrillation (HCC) 10/2017   a. CHA2DS2VASc = 5-->Eliquis; b. 12/2017 loaded w/amio-->DCCV.   PSVT (paroxysmal supraventricular tachycardia) (HCC)    a. 05/2014 s/p RFCA @ [facility].   Right inguinal hernia    Urinary incontinence     Past Surgical History:  Procedure Laterality Date   AORTIC VALVE REPLACEMENT     CARDIAC CATHETERIZATION     CARDIOVERSION N/A 01/01/2018   Procedure: CARDIOVERSION;  Surgeon: Antonieta Iba, MD;  Location: [facility] ORS;  Service: Cardiovascular;  Laterality: N/A;   CARDIOVERSION N/A 05/16/2021   Procedure: CARDIOVERSION;  Surgeon: Yvonne Kendall, MD;  Location: [facility] ORS;  Service: Cardiovascular;  Laterality: N/A;   CARDIOVERSION N/A 05/17/2021   Procedure: CARDIOVERSION;  Surgeon: Debbe Odea, MD;  Location: [facility] ORS;  Service: Cardiovascular;  Laterality: N/A;   COLONOSCOPY  2010   DIALYSIS/PERMA CATHETER INSERTION N/A 05/26/2021   Procedure: DIALYSIS/PERMA CATHETER INSERTION;  Surgeon: Renford Dills, MD;  Location: [facility] INVASIVE [facility];  Service: Cardiovascular;  Laterality: N/A;    DIALYSIS/PERMA CATHETER REMOVAL N/A 08/04/2021   Procedure: DIALYSIS/PERMA CATHETER REMOVAL;  Surgeon: Renford Dills, MD;  Location: [facility];  Service: Cardiovascular;  Laterality: N/A;   heart valve replacment  2015   TEMPORARY DIALYSIS CATHETER N/A 05/23/2021   Procedure: TEMPORARY DIALYSIS CATHETER;  Surgeon: Renford Dills, MD;  Location: [facility];  Service: Cardiovascular;  Laterality: N/A;   XI ROBOTIC ASSISTED INGUINAL HERNIA REPAIR WITH MESH Right 01/07/2019   Procedure: XI ROBOTIC ASSISTED INGUINAL HERNIA REPAIR WITH MESH;  Surgeon: Campbell Lerner, MD;  Location: [facility] ORS;  Service: General;  Laterality: Right;    Current Medications: Current Meds  Medication Sig   amiodarone (PACERONE) 200 MG tablet Take 1 tablet (200 mg total) by mouth daily.   Cholecalciferol (VITAMIN D3 PO) Take by mouth daily.   doxazosin (CARDURA) 8 MG tablet TAKE 1 TABLET BY MOUTH TWICE A DAY   ELIQUIS 2.5 MG TABS tablet TAKE 1 TABLET BY MOUTH TWICE A DAY   furosemide (LASIX) 80 MG tablet Take 1 tablet (80 mg total) by mouth daily.   rosuvastatin (CRESTOR) 10 MG tablet Take 1 tablet (10 mg total) by mouth daily.    Allergies:   Gabapentin, Hydralazine, and Hydralazine hcl  Social History   Socioeconomic History   Marital status: Married    Spouse name: betty   Number of children: 2   Years of education: 12   Highest education level: High school graduate  Occupational History   Occupation: retired    Comment: truck Hospital doctor  Tobacco Use   Smoking status: Former    Packs/day: 0.50    Years: 1.00    Total pack years: 0.50    Types: Cigarettes    Quit date: 01/30/1972    Years since quitting: 49.5   Smokeless tobacco: Never   Tobacco comments:    over 45 yrs ago  Vaping Use   Vaping Use: Never used  Substance and Sexual Activity   Alcohol use: No    Alcohol/week: 0.0 standard drinks of alcohol   Drug use: No   Sexual activity: Not Currently  Other Topics  Concern   Not on file  Social History Narrative   Not on file   Social Determinants of Health   Financial Resource Strain: Low Risk  (11/05/2017)   Overall Financial Resource Strain (CARDIA)    Difficulty of Paying Living Expenses: Not hard at all  Food Insecurity: No Food Insecurity (11/05/2017)   Hunger Vital Sign    Worried About Running Out of Food in the Last Year: Never true    Ran Out of Food in the Last Year: Never true  Transportation Needs: No Transportation Needs (11/05/2017)   PRAPARE - Administrator, Civil Service (Medical): No    Lack of Transportation (Non-Medical): No  Physical Activity: Inactive (11/05/2017)   Exercise Vital Sign    Days of Exercise per Week: 0 days    Minutes of Exercise per Session: 0 min  Stress: No Stress Concern Present (11/05/2017)   [employer] of Occupational Health - Occupational Stress Questionnaire    Feeling of Stress : Not at all  Social Connections: Moderately Isolated (11/05/2017)   Social Connection and Isolation Panel [NHANES]    Frequency of Communication with Friends and Family: Once a week    Frequency of Social Gatherings with Friends and Family: Once a week    Attends Religious Services: Never    Database administrator or Organizations: No    Attends Engineer, structural: Never    Marital Status: Married     Family History:  The patient's family history includes Cancer in his mother; Stroke in his father.  ROS:   12-point review of systems is negative unless otherwise noted in the HPI.   EKGs/Labs/Other Studies Reviewed:    Studies reviewed were summarized above. The additional studies were reviewed today:  Lexiscan MPI 07/11/2021:   Abnormal pharmacologic myocardial perfusion stress test.   There is a moderate in size, moderate in severity, fixed apical inferior, apical lateral, and apical defect most consistent with scar but cannot rule out artifact.   There is no evidence of significant  ischemia.   Left ventricular systolic function is moderately to severely reduced (LVEF 30-35%) with global hypokinesis.   Caronary artery calcification, aortic atherosclerosis, and aortic valve prosthesis are seen on the attenuation correction CT.   Small right pleural effusion is present. Asymmetric breast densities (right > left) are incidentally noted; clincial correlation recommended.   Compared to the prior study on 03/18/2018, the fixed apical defect is new.  LVEF has also decreased.   This is an intermediate to high risk study, primarily due to low LVEF. __________  Limited echo 05/18/2021: 1.  Left ventricular ejection fraction, by estimation, is 30 %. The left  ventricle has moderately decreased function. The left ventricle  demonstrates global hypokinesis.   2. Right ventricular systolic function is moderately reduced. The right  ventricular size is mildly enlarged. There is mildly elevated pulmonary  artery systolic pressure. The estimated right ventricular systolic  pressure is 38.4 mmHg.   3. Left atrial size was mildly dilated.   4. The mitral valve is normal in structure. No evidence of mitral valve  regurgitation.   5. Tricuspid valve regurgitation is mild to moderate.   6. The aortic valve was not well visualized. Aortic valve regurgitation  is not visualized.   7. The inferior vena cava is normal in size with greater than 50%  respiratory variability, suggesting right atrial pressure of 3 mmHg. __________  2D echo 05/14/2021: 1. Left ventricular ejection fraction, by estimation, is 30 to 35%. The  left ventricle has moderately decreased function. The left ventricle  demonstrates global hypokinesis. The left ventricular internal cavity size  was moderately dilated. Left  ventricular diastolic parameters are indeterminate.   2. Right ventricular systolic function is moderately reduced. The right  ventricular size is moderately enlarged. There is moderately elevated   pulmonary artery systolic pressure. The estimated right ventricular  systolic pressure is 45.0 mmHg.   3. Left atrial size was moderately dilated.   4. The mitral valve is normal in structure. Moderate mitral valve  regurgitation. No evidence of mitral stenosis.   5. Tricuspid valve regurgitation is moderate.   6. The aortic valve has been repaired/replaced. s/p Borive AVR 03/2014,  appropritae gradient noted, max gradient 10 mm Hg. Aortic valve  regurgitation is not visualized. No aortic stenosis is present.   7. The inferior vena cava is dilated in size with <50% respiratory  variability, suggesting right atrial pressure of 15 mmHg. __________  2D echo 09/13/2020: 1. Left ventricular ejection fraction, by estimation, is 50 to 55%. The  left ventricle has low normal function. Left ventricular endocardial  border not optimally defined to evaluate regional wall motion. There is  mild left ventricular hypertrophy. Left  ventricular diastolic parameters are consistent with Grade II diastolic  dysfunction (pseudonormalization). Elevated left atrial pressure.   2. Right ventricular systolic function is normal. The right ventricular  size is normal. There is moderately elevated pulmonary artery systolic  pressure.   3. Left atrial size was moderately dilated.   4. Right atrial size was mildly dilated.   5. The mitral valve is abnormal. Mild mitral valve regurgitation. No  evidence of mitral stenosis.   6. Tricuspid valve regurgitation is mild to moderate.   7. The aortic valve has been repaired/replaced. Aortic valve  regurgitation is mild to moderate. There is a 23 mm Edwards bioprosthetic  valve present in the aortic position. Procedure Date: 03/01/14. Aortic valve  mean gradient measures 13.0 mmHg.   8. The inferior vena cava is dilated in size with >50% respiratory  variability, suggesting right atrial pressure of 8 mmHg. __________  Eugenie Birks MPI 03/18/2018: Normal pharmacologic  myocardial perfusion stress test without ischemia or scar. The left ventricular ejection fraction is normal by visual estimation and Siemens calculation (59%). LVEF by QGS is likely reduced due to gating artifact (42%). This is a low risk study. __________  2D echo 03/14/2018: 1. The left ventricle has normal systolic function with an ejection  fraction of 60-65%. The cavity size was normal. There is mildly increased  left ventricular wall thickness. Left ventricular  diastolic Doppler  parameters are consistent with impaired  relaxation.   2. The right ventricle has normal systolic function. The cavity was  normal. There is no increase in right ventricular wall thickness.   3. Left atrial size was mildly dilated.   4. 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  Procedure Date: 03/01/14 Normal aortic valve prosthesis. AV Mean Grad: 16.0  mmHg   5. Right atrial pressure is estimated at 10 mmHg. __________  Luci Bank patch 10/2017: Persistent atrial fibrillation occurred continuously (100% burden), ranging from 58-152 bpm (avg of 95 bpm).   1 run of Ventricular Tachycardia occurred lasting 14.9 secs with a max rate of 193 bpm (avg 161 bpm).  5:46 AM No patient trigger noted   Isolated VEs were rare (<1.0%), VE Couplets were rare (<1.0%), and no VE Triplets were present. __________  2D echo 11/13/2017: - Left ventricle: The cavity size was normal. Wall thickness was    increased in a pattern of moderate LVH. Systolic function was    mildly reduced. The estimated ejection fraction was in the range    of 45% to 50%. Diffuse hypokinesis.  - Aortic valve: A bioprosthesis was present. Transvalvular velocity    was minimally increased. There was mild stenosis. There was mild    regurgitation. Mean gradient (S): 13 mm Hg.  - Mitral valve: Calcified annulus. There was mild regurgitation.  - Left atrium: The atrium was mildly dilated.  - Right ventricle: The cavity size was  mildly dilated. Systolic    function was mildly reduced.  - Right atrium: The atrium was mildly dilated.  - Tricuspid valve: Poorly visualized. There was mild-moderate    regurgitation.  - Inferior vena cava: The vessel was dilated. The respirophasic    diameter changes were in the normal range (>= 50%), consistent    with mildly elevated central venous pressure. __________  2D echo 04/23/2016: - Left ventricle: The cavity size was normal. Wall thickness was    increased increased in a pattern of mild to moderate LVH.    Systolic function was normal. The estimated ejection fraction was    in the range of 55% to 65%. Hypokinesis of the anteroseptal    myocardium. Doppler parameters are consistent with abnormal left    ventricular relaxation (grade 1 diastolic dysfunction).  - Aortic valve: A bioprosthesis was present. Transvalvular velocity    was increased. There was mild stenosis. There was trivial    regurgitation. Peak velocity (S): 277 cm/s. Mean gradient (S): 18    mm Hg. Peak gradient (S): 31 mm Hg.  - Left atrium: The atrium was mildly dilated.  - Right ventricle: Systolic function was normal.  - Pulmonary arteries: Systolic pressure was within the normal    range.   EKG:  EKG is ordered today.  The EKG ordered today demonstrates NSR, 62 bpm, RBBB, no significant change when compared to prior tracing  Recent Labs: 05/13/2021: B Natriuretic Peptide 1,597.6 05/28/2021: Magnesium 2.0 08/12/2021: ALT 20; TSH 5.608 08/13/2021: BUN 41; Creatinine, Ser 2.25; Hemoglobin 10.6; Platelets 160; Potassium 3.4; Sodium 142  Recent Lipid Panel    Component Value Date/Time   CHOL 135 12/23/2018 0000   TRIG 185 (H) 12/23/2018 0000   HDL 28 (L) 12/23/2018 0000   CHOLHDL 4.8 12/23/2018 0000   VLDL 70 (H) 06/29/2016 1408   LDLCALC 79 12/23/2018 0000    PHYSICAL EXAM:    VS:  BP (!) 150/70 (BP Location: Left Arm, Patient Position:  Sitting, Cuff Size: Normal)   Pulse 62   Ht 5\' 8"  (1.727  m)   Wt 175 lb 6 oz (79.5 kg)   SpO2 97%   BMI 26.67 kg/m   BMI: Body mass index is 26.67 kg/m.  Physical Exam Vitals reviewed.  Constitutional:      Appearance: He is well-developed.  HENT:     Head: Normocephalic and atraumatic.  Eyes:     General:        Right eye: No discharge.        Left eye: No discharge.  Neck:     Vascular: No JVD.  Cardiovascular:     Rate and Rhythm: Normal rate and regular rhythm.     Heart sounds: Normal heart sounds, S1 normal and S2 normal. Heart sounds not distant. No midsystolic click and no opening snap. No murmur heard.    No friction rub.  Pulmonary:     Effort: Pulmonary effort is normal. No respiratory distress.     Breath sounds: Normal breath sounds. No decreased breath sounds, wheezing or rales.  Chest:     Chest wall: No tenderness.  Abdominal:     General: There is no distension.  Musculoskeletal:     Cervical back: Normal range of motion.     Right lower leg: No edema.     Left lower leg: No edema.  Skin:    General: Skin is warm and dry.     Nails: There is no clubbing.  Neurological:     Mental Status: He is alert and oriented to person, place, and time.  Psychiatric:        Speech: Speech normal.        Behavior: Behavior normal.        Thought Content: Thought content normal.        Judgment: Judgment normal.     Wt Readings from Last 3 Encounters:  08/15/21 175 lb 6 oz (79.5 kg)  08/12/21 184 lb 1.4 oz (83.5 kg)  06/13/21 182 lb 4 oz (82.7 kg)     ASSESSMENT & PLAN:   Altered mental status with symptomatic bradycardia and hypoglycemia: In the context of symptomatic bradycardia and hypoglycemia.  No longer on carvedilol, pioglitazone, or glipizide.  No further episodes.  Feels back to baseline.  Heart rate normal at 62 bpm in the office today.  No indication for emergent pacemaker implantation at this time.  Hemodynamically stable.  Place Zio patch to evaluate for recurrent symptomatic bradycardia, prolonged  pauses, high-grade AV block, or potential posttermination pause.  Continue to avoid beta-blocker from a cardiac perspective.  Recent echo as outlined above.  Follow-up with PCP for ongoing management of glucose.  HFrEF secondary to NICM: He appears euvolemic and well compensated.  Not currently on any GDMT secondary to contraindication of beta-blocker given symptomatic bradycardia.  He has not been maintained on ACE inhibitor/ARB/ARNI/MRA/SGLT2 inhibitor secondary to renal dysfunction previously requiring hemodialysis.  Hopefully, if renal function continues to improve we can revisit escalation of GDMT with an ACE inhibitor or ARB to begin with.  Persistent A-fib: Maintaining sinus rhythm on amiodarone.  CHA2DS2-VASc at least 6.  Remains on renally dosed apixaban (age and serum creatinine).  No symptoms concerning for bleeding.  Severe aortic stenosis status post bioprosthetic AVR: He is on apixaban in place of aspirin to minimize bleeding risk.  SBE prophylaxis is indicated for dental procedures.  Follow-up periodic echo.  HTN: Blood pressure is mildly elevated today, though given her recent  altered mental status episode we will continue to monitor.    CAD with elevated troponin/HLD: No symptoms concerning for angina.  Recent Lexiscan MPI showed no evidence of ischemia with a possible scar versus artifact along the apical region.  Not currently a cath candidate given underlying renal dysfunction previously requiring hemodialysis, as well as in the context of lack of anginal symptoms and nuclear stress test showing no evidence of ischemia.  He remains on apixaban in place of aspirin to minimize bleeding risk along with rosuvastatin.  History of ESRD: Previously requiring hemodialysis.  Follow-up with nephrology as directed.  Abnormal TSH: Likely subclinical.  Given TSH is only mildly elevated, doubt that this was contributing to his bradycardia.  Follow-up with PCP.   Disposition: F/u with Dr.  Mariah Milling as previously scheduled in 2 months.   Medication Adjustments/Labs and Tests Ordered: Current medicines are reviewed at length with the patient today.  Concerns regarding medicines are outlined above. Medication changes, Labs and Tests ordered today are summarized above and listed in the Patient Instructions accessible in Encounters.   Signed, Eula Listen, PA-C 08/15/2021 11:53 AM     [facility] [address] [place], [place] [address] [phone]

## 2021-08-15 ENCOUNTER — Ambulatory Visit (INDEPENDENT_AMBULATORY_CARE_PROVIDER_SITE_OTHER): Payer: Medicare Other

## 2021-08-15 ENCOUNTER — Ambulatory Visit: Payer: Medicare Other | Admitting: Physician Assistant

## 2021-08-15 ENCOUNTER — Encounter: Payer: Self-pay | Admitting: Physician Assistant

## 2021-08-15 VITALS — BP 150/70 | HR 62 | Ht 68.0 in | Wt 175.4 lb

## 2021-08-15 DIAGNOSIS — Z952 Presence of prosthetic heart valve: Secondary | ICD-10-CM

## 2021-08-15 DIAGNOSIS — I10 Essential (primary) hypertension: Secondary | ICD-10-CM

## 2021-08-15 DIAGNOSIS — I4819 Other persistent atrial fibrillation: Secondary | ICD-10-CM

## 2021-08-15 DIAGNOSIS — R001 Bradycardia, unspecified: Secondary | ICD-10-CM | POA: Diagnosis not present

## 2021-08-15 DIAGNOSIS — I5020 Unspecified systolic (congestive) heart failure: Secondary | ICD-10-CM | POA: Diagnosis not present

## 2021-08-15 DIAGNOSIS — R404 Transient alteration of awareness: Secondary | ICD-10-CM

## 2021-08-15 DIAGNOSIS — Z87448 Personal history of other diseases of urinary system: Secondary | ICD-10-CM

## 2021-08-15 DIAGNOSIS — R7989 Other specified abnormal findings of blood chemistry: Secondary | ICD-10-CM

## 2021-08-15 DIAGNOSIS — E785 Hyperlipidemia, unspecified: Secondary | ICD-10-CM

## 2021-08-15 DIAGNOSIS — I428 Other cardiomyopathies: Secondary | ICD-10-CM

## 2021-08-15 DIAGNOSIS — I2510 Atherosclerotic heart disease of native coronary artery without angina pectoris: Secondary | ICD-10-CM

## 2021-08-15 DIAGNOSIS — I350 Nonrheumatic aortic (valve) stenosis: Secondary | ICD-10-CM

## 2021-08-15 NOTE — Patient Instructions (Signed)
Medication Instructions:  Your physician has recommended you make the following change in your medication:   STOP Carvedilol  Medication Samples have been provided to the patient.  Drug name: Eliquis        Strength: 2.5 mg         Qty: 1 box   LOT: [id number]   Exp.Date: 10/23    *If you need a refill on your cardiac medications before your next appointment, please call your pharmacy*   Lab Work: None  If you have labs (blood work) drawn today and your tests are completely normal, you will receive your results only by: [place] (if you have MyChart) OR A paper copy in the mail If you have any lab test that is abnormal or we need to change your treatment, we will call you to review the results.   Testing/Procedures: Your physician has recommended that you wear a Zio monitor.   This monitor is a medical device that records the heart's electrical activity. Doctors most often use these monitors to diagnose arrhythmias. Arrhythmias are problems with the speed or rhythm of the heartbeat. The monitor is a small device applied to your chest. You can wear one while you do your normal daily activities. While wearing this monitor if you have any symptoms to push the button and record what you felt. Once you have worn this monitor for the period of time provider prescribed (Usually 14 days), you will return the monitor device in the postage paid box. Once it is returned they will download the data collected and provide [place] with a report which the provider will then review and we will call you with those results. Important tips:  Avoid showering during the first 24 hours of wearing the monitor. Avoid excessive sweating to help maximize wear time. Do not submerge the device, no hot tubs, and no swimming pools. Keep any lotions or oils away from the patch. After 24 hours you may shower with the patch on. Take brief showers with your back facing the shower head.  Do not remove patch once it  has been placed because that will interrupt data and decrease adhesive wear time. Push the button when you have any symptoms and write down what you were feeling. Once you have completed wearing your monitor, remove and place into box which has postage paid and place in your outgoing mailbox.  If for some reason you have misplaced your box then call our office and we can provide another box and/or mail it off for you.      Follow-Up: At [facility], you and your health needs are our priority.  As part of our continuing mission to provide you with exceptional heart care, we have created designated Provider Care Teams.  These Care Teams include your primary Cardiologist (physician) and Advanced Practice Providers (APPs -  Physician Assistants and Nurse Practitioners) who all work together to provide you with the care you need, when you need it.  We recommend signing up for the patient portal called "MyChart".  Sign up information is provided on this After Visit Summary.  MyChart is used to connect with patients for Virtual Visits (Telemedicine).  Patients are able to view lab/test results, encounter notes, upcoming appointments, etc.  Non-urgent messages can be sent to your provider as well.   To learn more about what you can do with MyChart, go to [email or web address].    Your next appointment:   Keep scheduled appointment in September   The format  for your next appointment:   In Person  Provider:   Dr. Ida Rogue       Important Information About Sugar

## 2021-08-18 DIAGNOSIS — I4819 Other persistent atrial fibrillation: Secondary | ICD-10-CM

## 2021-08-24 ENCOUNTER — Telehealth: Payer: Self-pay | Admitting: Cardiovascular Disease

## 2021-08-24 MED ORDER — FUROSEMIDE 80 MG PO TABS
80.0000 mg | ORAL_TABLET | Freq: Every day | ORAL | 0 refills | Status: DC
Start: 2021-08-24 — End: 2021-08-30

## 2021-08-24 MED ORDER — AMIODARONE HCL 200 MG PO TABS: 200.0000 mg | ORAL_TABLET | Freq: Every day | ORAL | 0 refills | Status: DC

## 2021-08-24 NOTE — Telephone Encounter (Signed)
Requested Prescriptions   Signed Prescriptions Disp Refills   amiodarone (PACERONE) 200 MG tablet 90 tablet 0    Sig: Take 1 tablet (200 mg total) by mouth daily.    Authorizing Provider: Minna Merritts    Ordering User: Raelene Bott, Danyale Ridinger L   furosemide (LASIX) 80 MG tablet 90 tablet 0    Sig: Take 1 tablet (80 mg total) by mouth daily.    Authorizing Provider: Minna Merritts    Ordering User: Raelene Bott, Ladavion Savitz L

## 2021-08-24 NOTE — Telephone Encounter (Signed)
*  STAT* If patient is at the pharmacy, call can be transferred to refill team.   1. Which medications need to be refilled? (please list name of each medication and dose if known) Amiodarone '200mg'$   1 tablet daily and Furosemide '80mg'$  1tablet daily  2. Which pharmacy/location (including street and city if local pharmacy) is medication to be sent to? [place]  3. Do they need a 30 day or 90 day supply? 90 day supply

## 2021-08-30 ENCOUNTER — Telehealth: Payer: Self-pay | Admitting: Cardiovascular Disease

## 2021-08-30 ENCOUNTER — Encounter: Payer: Self-pay | Admitting: Internal Medicine

## 2021-08-30 ENCOUNTER — Ambulatory Visit: Payer: Medicare Other | Admitting: Internal Medicine

## 2021-08-30 VITALS — BP 158/70 | HR 58 | Ht 68.0 in | Wt 169.0 lb

## 2021-08-30 DIAGNOSIS — I951 Orthostatic hypotension: Secondary | ICD-10-CM | POA: Diagnosis not present

## 2021-08-30 DIAGNOSIS — I5020 Unspecified systolic (congestive) heart failure: Secondary | ICD-10-CM | POA: Diagnosis not present

## 2021-08-30 DIAGNOSIS — I4819 Other persistent atrial fibrillation: Secondary | ICD-10-CM | POA: Diagnosis not present

## 2021-08-30 DIAGNOSIS — R001 Bradycardia, unspecified: Secondary | ICD-10-CM | POA: Diagnosis not present

## 2021-08-30 MED ORDER — FUROSEMIDE 80 MG PO TABS: 40.0000 mg | ORAL_TABLET | Freq: Every day | ORAL | Status: DC

## 2021-08-30 NOTE — Telephone Encounter (Signed)
STAT if patient feels like he/she is going to faint   Are you dizzy now? Comes and goes  Do you feel faint or have you passed out? no  Do you have any other symptoms? Dizzy light headed  Have you checked your HR and BP (record if available)? 95/50, 159/71  Patient medication concerns, Amiodarine and Doxazosin

## 2021-08-30 NOTE — Progress Notes (Signed)
Follow-up Outpatient Visit Date: 08/30/2021  Primary Care Provider: Derinda Late, MD [phone] [address] [facility] [place] [address]  Primary Cardiologist: Esmond Plants, MD PhD  Chief Complaint: Lightheadedness  HPI:  Mr. Centola is a 81 y.o. male with history of nonobstructive coronary artery disease, chronic HFrEF, persistent atrial fibrillation, severe aortic stenosis status post TAVR, SVT status post ablation, chronic kidney disease (previously on hemodialysis but successfully weaned from this), hypertension, and type 2 diabetes mellitus, who presents for urgent evaluation of lightheadedness and low blood pressure.  He was last seen in the office on 08/15/2021 by Christell Faith, PA, for hospital follow-up.  He was hospitalized in mid July with altered mental status and possible syncope.  He was initially found to be bradycardic with a heart rate of 32 bpm and hypoglycemic with a glucose of 63.  Carvedilol was held; no other intervention was pursued.  At the time of his follow-up visit, he was feeling back to baseline.  Heart rate was in the 60s on carvedilol.  Event monitor was ordered (he will remove this in 2 days).  Mr. Blount presented to the office today complaining of recurrent lightheadedness.  Several times over the last week, he has felt lightheadedness and has recorded blood pressures down to the 80s over 60s.  This morning, his blood pressure was 622 systolic.  He has not fallen or passed out but sometimes has to lie down for up to an hour due to dizziness.  He reports very frequent urination, remaining on furosemide 80 mg daily.  He also continues to take doxazosin 8 mg twice daily to help with blood pressure and leg swelling, as previously recommended by Dr. Rockey Situ.  He has not had any chest pain, shortness of breath, palpitations, or edema.  He saw his PCP a couple days ago, who recommended that he continue his current medications as his body was  adjusting to that.  He is scheduled for nephrology follow-up with Dr. Holley Raring tomorrow.  He has been eating and drinking well though he notes that his weight has dropped 6 pounds over the last month.  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 Glower MD @ [facility] via R thoracic approach; b. 03/2018 Echo: EF 60-65%, DD. Mildly dil LA. Nl fxn AoV prosthesis. Grad 38mHg.   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 12/2013 Cath ([facility]): LAD 30p, RI 60, otw nl (per CT surgery note from [facility]; b. 03/2018 MV: EF 59%, no ischemia/scar.   Persistent atrial fibrillation ([facility] 10/2017   a. CHA2DS2VASc = 5-->Eliquis; b. 12/2017 loaded w/amio-->[facility].   PSVT (paroxysmal supraventricular tachycardia) ([facility]    a. 05/2014 s/p RFCA @ [facility].   Right inguinal hernia    Urinary incontinence    Past Surgical History:  Procedure Laterality Date   AORTIC VALVE REPLACEMENT     CARDIAC  CATHETERIZATION     CARDIOVERSION N/A 01/01/2018   Procedure: CARDIOVERSION;  Surgeon: Minna Merritts, MD;  Location: [place] [facility];  Service: Cardiovascular;  Laterality: N/A;   CARDIOVERSION N/A 05/16/2021   Procedure: CARDIOVERSION;  Surgeon: Nelva Bush, MD;  Location: [facility];  Service: Cardiovascular;  Laterality: N/A;   CARDIOVERSION N/A 05/17/2021   Procedure: CARDIOVERSION;  Surgeon:  Kate Sable, MD;  Location: [facility];  Service: Cardiovascular;  Laterality: N/A;   COLONOSCOPY  2010   DIALYSIS/PERMA CATHETER INSERTION N/A 05/26/2021   Procedure: DIALYSIS/PERMA CATHETER INSERTION;  Surgeon: Katha Cabal, MD;  Location: [place] [facility];  Service: Cardiovascular;  Laterality: N/A;   DIALYSIS/PERMA CATHETER REMOVAL N/A 08/04/2021   Procedure: DIALYSIS/PERMA CATHETER REMOVAL;  Surgeon: Katha Cabal, MD;  Location: [place] [facility];  Service: Cardiovascular;  Laterality: N/A;   heart valve replacment  2015   TEMPORARY DIALYSIS CATHETER N/A 05/23/2021   Procedure: TEMPORARY DIALYSIS CATHETER;  Surgeon: Katha Cabal, MD;  Location: [place] [facility];  Service: Cardiovascular;  Laterality: N/A;   XI ROBOTIC ASSISTED INGUINAL HERNIA REPAIR WITH MESH Right 01/07/2019   Procedure: XI ROBOTIC ASSISTED INGUINAL HERNIA REPAIR WITH MESH;  Surgeon: Ronny Bacon, MD;  Location: [facility];  Service: General;  Laterality: Right;    Current Meds  Medication Sig   amiodarone (PACERONE) 200 MG tablet Take 1 tablet (200 mg total) by mouth daily.   doxazosin (CARDURA) 8 MG tablet TAKE 1 TABLET BY MOUTH TWICE A DAY   ELIQUIS 2.5 MG TABS tablet TAKE 1 TABLET BY MOUTH TWICE A DAY   furosemide (LASIX) 80 MG tablet Take 1 tablet (80 mg total) by mouth daily.   rosuvastatin (CRESTOR) 10 MG tablet Take 1 tablet (10 mg total) by mouth daily.    Allergies: Gabapentin, Hydralazine, and Hydralazine hcl  Social History   Tobacco Use   Smoking status: Former    Packs/day: 0.50    Years: 1.00    Total pack years: 0.50    Types: Cigarettes    Quit date: 01/30/1972    Years since quitting: 49.6   Smokeless tobacco: Never   Tobacco comments:    over 22 yrs ago  Vaping Use   Vaping Use: Never used  Substance Use Topics   Alcohol use: No    Alcohol/week: 0.0 standard drinks of alcohol   Drug use: No    Family History  Problem Relation Age of Onset   Cancer  Mother    Stroke Father     Review of Systems: A 12-system review of systems was performed and was negative except as noted in the HPI.  --------------------------------------------------------------------------------------------------  Physical Exam: BP (!) 158/70 (BP Location: Left Arm, Patient Position: Sitting, Cuff Size: Normal)   Pulse (!) 58   Ht '5\' 8"'$  (1.727 m)   Wt 169 lb (76.7 kg)   SpO2 96%   BMI 25.70 kg/m  Position Blood pressure (mmHg) Heart rate (bpm)  Lying 189/75 62  Sitting 168/70 61  Standing 154/71 65  Standing (3 minutes) 136/76 67   General:  NAD. Neck: No JVD or HJR. Lungs: Clear to auscultation bilaterally without wheezes or crackles. Heart: Bradycardic but regular with 2/6 systolic murmur.  No rubs or gallops. Abdomen: Soft, nontender, nondistended. Extremities: No lower extremity edema.  EKG: Sinus bradycardia with left axis deviation, right bundle branch block, and anterolateral T wave inversions.  No significant change from prior tracing on 08/15/2021.  Lab Results  Component Value Date  WBC 4.5 08/13/2021   HGB 10.6 (L) 08/13/2021   HCT 32.3 (L) 08/13/2021   MCV 88.7 08/13/2021   PLT 160 08/13/2021    Lab Results  Component Value Date   NA 142 08/13/2021   K 3.4 (L) 08/13/2021   CL 108 08/13/2021   CO2 29 08/13/2021   BUN 41 (H) 08/13/2021   CREATININE 2.25 (H) 08/13/2021   GLUCOSE 142 (H) 08/13/2021   ALT 20 08/12/2021    Lab Results  Component Value Date   CHOL 135 12/23/2018   HDL 28 (L) 12/23/2018   LDLCALC 79 12/23/2018   TRIG 185 (H) 12/23/2018   CHOLHDL 4.8 12/23/2018    --------------------------------------------------------------------------------------------------  ASSESSMENT AND PLAN: Orthostatic hypotension: Mr. Sam complains of at least 1 to 2 weeks of episodic lightheadedness, especially when he has been standing up.  Orthostatic vital signs today are quite positive with a 53 mmHg systolic blood pressure  drop from lying to standing for 3 minutes.  He reports that he voids quite a bit with his current dose of furosemide.  He also limits his fluid intake.  I am concerned that he may be intravascularly volume depleted.  This is supported by continued weight decline including a 6 pound drop since his last visit with [place] about 2 weeks ago.  I have asked him to skip furosemide tomorrow and then to reduce his daily dose to 40 mg daily.  He may ultimately require de-escalation of doxazosin, as this can be a trigger for orthostatic hypotension as well.  However, in the setting of supine hypertension, I will defer this change today.  I encouraged him to consider wearing tight fitting garments around his thigh and core to help improve venous return.  Bradycardia: EKG today again shows sinus bradycardia with left axis deviation and right bundle branch block.  Event monitor is still in place, to be removed in 2 days.  I worry that Mr. Bosso may have some underlying chronotropic incompetence given the fact that he did not exhibit a significant change in his heart rate with orthostatic vital signs despite a marked blood pressure drop.  Rate controlling agents should continue to be avoided.  Feasibility of long-term amiodarone use will need to be explored after his event monitor has been reviewed and response to decreased diuresis reassessed.  Persistent atrial fibrillation: Mr. Reale is maintaining sinus rhythm.  We will continue amiodarone and apixaban for now.  Ongoing management deferred to Dr. Rockey Situ.  Chronic HFrEF: Mr. Cheese appears euvolemic to dry.  As above, we will decrease furosemide.  He is not on any goal-directed medical therapy in the setting of his baseline bradycardia and advanced CKD.  Ongoing management per Dr. Rockey Situ.  Follow-up: Return to clinic as previously scheduled with Dr. Rockey Situ on 10/03/2021.  Nelva Bush, MD 08/30/2021 3:27 PM

## 2021-08-30 NOTE — Patient Instructions (Signed)
Medication Instructions:   Your physician has recommended you make the following change in your medication:   HOLD Lasix (furosemide) TOMORROW  THEN DECREASE Lasix to 40 mg daily (cut current 80 mg tablet in half)  *If you need a refill on your cardiac medications before your next appointment, please call your pharmacy*   Lab Work:  None ordered  Testing/Procedures:  None ordered   Follow-Up: At [facility], you and your health needs are our priority.  As part of our continuing mission to provide you with exceptional heart care, we have created designated Provider Care Teams.  These Care Teams include your primary Cardiologist (physician) and Advanced Practice Providers (APPs -  Physician Assistants and Nurse Practitioners) who all work together to provide you with the care you need, when you need it.  We recommend signing up for the patient portal called "MyChart".  Sign up information is provided on this After Visit Summary.  MyChart is used to connect with patients for Virtual Visits (Telemedicine).  Patients are able to view lab/test results, encounter notes, upcoming appointments, etc.  Non-urgent messages can be sent to your provider as well.   To learn more about what you can do with MyChart, go to [email or web address].    Your next appointment:    Keep follow up with Dr. Rockey Situ as scheduled  Important Information About [place]

## 2021-08-30 NOTE — Telephone Encounter (Signed)
Discussed with the front desk. As the patient has no phone that is working at this time, it would be best to put on a provider schedule to review his BP/ symptoms/ and medications. The patient has been scheduled to see Dr. Saunders Revel (DOD) at 3:20 pm today.

## 2021-09-18 ENCOUNTER — Telehealth: Payer: Self-pay | Admitting: *Deleted

## 2021-09-18 MED ORDER — DOXAZOSIN MESYLATE 8 MG PO TABS: 8.0000 mg | ORAL_TABLET | Freq: Every day | ORAL | 3 refills | Status: DC

## 2021-09-18 NOTE — Telephone Encounter (Signed)
-----   Message from Rise Mu, PA-C sent at 09/18/2021  7:28 AM EDT ----- Heart monitor showed a predominant rhythm of normal sinus with an average rate of 64 bpm (range 44 to 179 bpm), 1 run of NSVT lasting 4 beats, and 1 run of SVT lasting 11 beats.  Rare extra beats from the bottom and top portions of the heart.  Patient triggered event was associated with sinus rhythm.  Overall, no sustained arrhythmias identified, high-grade AV block, or prolonged pauses.  Continue to avoid beta-blocker.  Patient has a known right bundle branch block.

## 2021-09-18 NOTE — Telephone Encounter (Signed)
Patient returning call  Came by office

## 2021-09-18 NOTE — Telephone Encounter (Signed)
No answer/No voicemail box set up.  

## 2021-09-18 NOTE — Telephone Encounter (Signed)
With visit earlier this month by DOD for orthostatic hypotension, okay to decrease doxazosin to 4 mg daily.  Follow-up with primary cardiologist as directed in early September.

## 2021-09-18 NOTE — Telephone Encounter (Signed)
Verbal discussion with provider and he would like Cardura 8 mg once a day.   Spoke with patient and reviewed recommendations. He was agreeable with plan with no further questions at this time.

## 2021-09-18 NOTE — Telephone Encounter (Signed)
Reviewed results with patient and he had some concerns. He provided numerous readings with range of 94/50 up to 121/60. This morning it was 114/67 and he is concerned with those readings. Explained that these are within normal range but he feels like they are too low. Advised that I would send this message over to provider for his review and would give him a call back with his recommendations.

## 2021-10-02 NOTE — Progress Notes (Signed)
Cardiology Office Note  Date:  10/03/2021   ID:  Timothy Riley, DOB July 11, 1940, MRN [id number]  PCP:  Derinda Late, MD   Chief Complaint  Patient presents with   Follow-up    "Doing well." Medications reviewed by the patient verbally.     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,  diabetes type 2  BPH,  ejection fraction 30% who presents for routine follow-up of his bioprosthetic valve, hypertension , atrial fibrillation  Last seen by myself in clinic May 2023 Hospitalized April 2023 CHF, EF at that time 30 to 35% in the setting of atrial fibrillation Underwent cardioversion EF low 53% Admission complicated by acute renal failure requiring hemodialysis Completed HD for 6 weeks Catheter was removed as renal function improved  Myoview July 11, 2021 no significant ischemia, moderate fixed defect noted apical inferior, apical lateral and apical region  Hospitalized June 15 encephalopathy, possible syncope, heart rate 32 glucose 63 in the field Carvedilol held  hospital July 2023, syncope  heart rate of 32 and glucose of 63.  Brought into the emergency room and found to have acute kidney injury with creatinine of 2.56  Glipizide and Actos discontinued  Orthostatics positive 08/30/21 in the office Lasix down to 40 daily  Declined EKG today  Has felt well, reports blood pressure at home typically well controlled less than 614 systolic Pressure elevated even on my recheck today He reports feeling well, like to start working in his garden again  Lab reviewed A1C 5.6  Zio monitor August 2023 Normal sinus rhythm Patient had a min HR of 44 bpm, max HR of 179 bpm, and avg HR of 64 bpm.  Bundle Branch Block/IVCD was present.    1 run of  Ventricular Tachycardia occurred lasting 4 beats with a max rate of 179 bpm (avg 148 bpm).  Not triggered   1 run of Supraventricular Tachycardia occurred lasting 11 beats with a max rate of 107 bpm (avg 95 bpm).  Isolated SVEs were rare (<1.0%), SVE Couplets were rare (<1.0%), and SVE Triplets were rare (<1.0%).  Isolated VEs were rare (<1.0%), and no VE Couplets or VE Triplets were present.   Patient triggered event associated with normal sinus rhythm   Other past medical history reviewed Echo 03/2018,   1. The left ventricle has normal systolic function with an ejection fraction of 60-65%. The cavity size was normal. There is mildly increased left ventricular wall thickness. Left ventricular diastolic Doppler parameters are consistent with impaired  relaxation.  2. The right ventricle has normal systolic function. The cavity was normal. There is no increase in right ventricular wall thickness.  3. Left atrial size was mildly dilated.  4. 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 Procedure Date: 03/01/14 Normal aortic valve prosthesis. AV Mean Grad: 16.0 mmHg  5. 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  PSH:    Past Surgical History:  Procedure Laterality Date   AORTIC VALVE REPLACEMENT     CARDIAC CATHETERIZATION     CARDIOVERSION N/A 01/01/2018   Procedure: CARDIOVERSION;  Surgeon: Minna Merritts, MD;  Location: [facility] ORS;  Service: Cardiovascular;  Laterality: N/A;   CARDIOVERSION N/A 05/16/2021   Procedure: CARDIOVERSION;  Surgeon: Nelva Bush, MD;  Location: [facility] OR[facility];  Service: Cardiovascular;  Laterality: N/A;   CARDIOVERSION N/A 05/17/2021   Procedure: CARDIOVERSION;  Surgeon: Kate Sable, MD;  Location: [facility] ORS;  Service: Cardiovascular;  Laterality: N/A;   COLONOSCOPY  2010   DIALYSIS/PERMA CATHETER INSERTION N/A 05/26/2021   Procedure: DIALYSIS/PERMA CATHETER INSERTION;  Surgeon: Katha Cabal, MD;  Location: [place] [facility];  Service: Cardiovascular;  Laterality: N/A;   DIALYSIS/PERMA CATHETER REMOVAL N/A 08/04/2021   Procedure: DIALYSIS/PERMA CATHETER REMOVAL;  Surgeon: Katha Cabal, MD;  Location: [place] [facility];  Service: Cardiovascular;  Laterality: N/A;   heart valve replacment  2015   TEMPORARY DIALYSIS CATHETER N/A 05/23/2021   Procedure: TEMPORARY DIALYSIS CATHETER;  Surgeon: Katha Cabal, MD;  Location: [place] [facility];  Service: Cardiovascular;  Laterality: N/A;   XI ROBOTIC ASSISTED INGUINAL HERNIA REPAIR WITH MESH Right 01/07/2019   Procedure: XI ROBOTIC ASSISTED INGUINAL HERNIA REPAIR WITH MESH;  Surgeon: Ronny Bacon, MD;  Location: [facility] OR[facility];  Service: General;  Laterality: Right;    Current Outpatient Medications  Medication Sig Dispense Refill   amiodarone (PACERONE) 200 MG tablet Take 1 tablet (200 mg total) by mouth daily. 90 tablet 0   doxazosin (CARDURA) 8 MG  tablet Take 1 tablet (8 mg total) by mouth daily. 60 tablet 3   ELIQUIS 2.5 MG TABS tablet TAKE 1 TABLET BY MOUTH TWICE A DAY 180 tablet 1   furosemide (LASIX) 80 MG tablet Take 0.5 tablets (40 mg total) by mouth daily.     rosuvastatin (CRESTOR) 10 MG tablet Take 1 tablet (10 mg total) by mouth daily. 90 tablet 0   No current facility-administered medications for this visit.    Allergies:   Gabapentin, Hydralazine, and Hydralazine hcl   Social History:  The patient  reports that he quit smoking about 49 years ago. His smoking use included cigarettes. He has a 0.50 pack-year smoking history. He has never used smokeless tobacco. He reports that he does not drink alcohol and does not use drugs.   Family History:   family history includes Cancer in his mother; Stroke in his father.   Review of Systems: Review of Systems  Constitutional: Negative.   HENT: Negative.    Respiratory: Negative.    Cardiovascular: Negative.   Gastrointestinal: Negative.   Musculoskeletal: Negative.   Neurological: Negative.   Psychiatric/Behavioral: Negative.    All other systems reviewed and are negative.   PHYSICAL EXAM: VS:  BP (!) 158/70 (BP Location: Left Arm, Patient Position: Sitting, Cuff Size: Normal) Comment: 5 min BP  Pulse 71   Ht '5\' 8"'$  (1.727 m)   Wt 171 lb 6 oz (77.7 kg)  SpO2 98%   BMI 26.06 kg/m  , BMI Body mass index is 26.06 kg/m. Constitutional:  oriented to person, place, and time. No distress.  HENT:  Head: Grossly normal Eyes:  no discharge. No scleral icterus.  Neck: No JVD, no carotid bruits  Cardiovascular: Regular rate and rhythm, no murmurs appreciated Pulmonary/Chest: Clear to auscultation bilaterally, no wheezes or rails Abdominal: Soft.  no distension.  no tenderness.  Musculoskeletal: Normal range of motion Neurological:  normal muscle tone. Coordination normal. No atrophy Skin: Skin warm and dry Psychiatric: normal affect, pleasant  Recent Labs: 05/13/2021: B  Natriuretic Peptide 1,597.6 05/28/2021: Magnesium 2.0 08/12/2021: ALT 20; TSH 5.608 08/13/2021: BUN 41; Creatinine, Ser 2.25; Hemoglobin 10.6; Platelets 160; Potassium 3.4; Sodium 142    Lipid Panel Lab Results  Component Value Date   CHOL 135 12/23/2018   HDL 28 (L) 12/23/2018   LDLCALC 79 12/23/2018   TRIG 185 (H) 12/23/2018    Wt Readings from Last 3 Encounters:  10/03/21 171 lb 6 oz (77.7 kg)  08/30/21 169 lb (76.7 kg)  08/15/21 175 lb 6 oz (79.5 kg)     ASSESSMENT AND PLAN:  Atrial fibrillation, persistent Prior cardioversion, maintaining normal sinus rhythm on exam, he is declining EKG Not on beta-blocker secondary to bradycardia Remains on low-dose amiodarone with Eliquis  Acute on chronic renal failure, end-stage renal Followed by nephrology, complete needed hemodialysis 3 days a week Monday Wednesday Friday for 6 weeks, now no longer on hemodialysis, port has been removed Appears euvolemic, will continue Lasix 40 daily  Leg swelling No significant swelling on today's visit  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  At home was reported 163 systolic  Diabetes: Stable, HBA1C 5.6  Arteriosclerosis of coronary artery - Plan: EKG 12-Lead Not a good candidate for cardiac catheterization given renal dysfunction Myoview has been completed  S/P AVR (aortic valve replacement) - Plan: EKG 12-Lead On echo, stable prosthetic valve  Hyperlipidemia Continue Crestor   Total  encounter time more than 30 minutes  Greater than 50% was spent in counseling and coordination of care with the patient    No orders of the defined types were placed in this encounter.    Signed, Esmond Plants, M.D., Ph.D. 10/03/2021  [place], [place]

## 2021-10-03 ENCOUNTER — Ambulatory Visit: Payer: Medicare Other | Attending: Cardiovascular Disease | Admitting: Cardiovascular Disease

## 2021-10-03 ENCOUNTER — Ambulatory Visit: Payer: Medicare Other | Admitting: Cardiovascular Disease

## 2021-10-03 ENCOUNTER — Encounter: Payer: Self-pay | Admitting: Cardiovascular Disease

## 2021-10-03 VITALS — BP 158/70 | HR 71 | Ht 68.0 in | Wt 171.4 lb

## 2021-10-03 DIAGNOSIS — I428 Other cardiomyopathies: Secondary | ICD-10-CM | POA: Diagnosis not present

## 2021-10-03 DIAGNOSIS — I350 Nonrheumatic aortic (valve) stenosis: Secondary | ICD-10-CM

## 2021-10-03 DIAGNOSIS — I5020 Unspecified systolic (congestive) heart failure: Secondary | ICD-10-CM

## 2021-10-03 DIAGNOSIS — R001 Bradycardia, unspecified: Secondary | ICD-10-CM | POA: Diagnosis not present

## 2021-10-03 DIAGNOSIS — Z87448 Personal history of other diseases of urinary system: Secondary | ICD-10-CM

## 2021-10-03 DIAGNOSIS — I25118 Atherosclerotic heart disease of native coronary artery with other forms of angina pectoris: Secondary | ICD-10-CM

## 2021-10-03 DIAGNOSIS — I4819 Other persistent atrial fibrillation: Secondary | ICD-10-CM | POA: Diagnosis not present

## 2021-10-03 DIAGNOSIS — I951 Orthostatic hypotension: Secondary | ICD-10-CM

## 2021-10-03 DIAGNOSIS — I10 Essential (primary) hypertension: Secondary | ICD-10-CM

## 2021-10-03 DIAGNOSIS — Z952 Presence of prosthetic heart valve: Secondary | ICD-10-CM

## 2021-10-03 NOTE — Patient Instructions (Addendum)
Monitor blood pressure at home and call if they are running high >140 on the top number.  Medication Instructions:  No changes  If you need a refill on your cardiac medications before your next appointment, please call your pharmacy.   Lab work: No new labs needed  Testing/Procedures: No new testing needed  Follow-Up: At [facility], you and your health needs are our priority.  As part of our continuing mission to provide you with exceptional heart care, we have created designated Provider Care Teams.  These Care Teams include your primary Cardiologist (physician) and Advanced Practice Providers (APPs -  Physician Assistants and Nurse Practitioners) who all work together to provide you with the care you need, when you need it.  You will need a follow up appointment in 3 months, APP ok  Providers on your designated Care Team:   Murray Hodgkins, NP Christell Faith, PA-C Cadence Kathlen Mody, [place]  COVID-19 Vaccine Information can be found at: ShippingScam.co.uk For questions related to vaccine distribution or appointments, please email [email or web address] or call [phone].

## 2021-10-04 ENCOUNTER — Telehealth: Payer: Self-pay | Admitting: Cardiovascular Disease

## 2021-10-04 NOTE — Telephone Encounter (Signed)
  Pt c/o BP issue: STAT if pt c/o blurred vision, one-sided weakness or slurred speech  1. What are your last 5 BP readings?  Last night upper number 211, 210 Today 159/76  2. Are you having any other symptoms (ex. Dizziness, headache, blurred vision, passed out)? None  3. What is your BP issue? Pt said he sprained ankle last night and his BP upper number went up to 211 and 210. He said he was told by Dr. Rockey Situ if his systolic goes up to 811

## 2021-10-05 NOTE — Telephone Encounter (Signed)
Called pt son [name] ok per DPR.  Reports pt had SBP readings in 200's yesterday.  Pt increased Doxazosin from daily to BID.  BP this am 138/70. Son reports BP was controlled prior to sprain ankle.  Pt taking tylenol for pain.  Advised that pain can increase BP.  Advised son to make sure pain is controlled and BP will hopefully settle.   Son wants to know if pt should continue to take Doxazosin 8 mg PO BID or go back to QD.  Advised to ensure adequate pain control and I will send concern to MD to address.

## 2021-10-09 NOTE — Telephone Encounter (Signed)
Timothy Merritts, MD  Sent: Sun October 08, 2021  2:09 PM  To: [facility] Triage          Message  Would consider taking doxazosin 8 mg in the morning, 4 mg in the evening,  Close monitoring of pressure  Would go down to 4 twice a day if blood pressure runs low (prefer 4 mg twice a day rather than 8 mg in the morning)  Thx  TGollan

## 2021-10-09 NOTE — Telephone Encounter (Signed)
I called and spoke with the patient's son, [name] (ok per DPR).  I have advised him of Dr. Donivan Scull recommendations to have the patient: - Take doxazosin 8 mg in the AM & 4 mg in the PM for elevated BP - Monitor BP, if BP is running low, then - Decrease doxazosin to 4 mg BID (rather than 8 mg QD)  [name] voices understanding of the above recommendations. However, he states that the patient sprained his ankle last week and his pain is now better controlled. Over the weekend he was "back to normal."  I inquired what "normal" BP's are for the patient. Per [name], 120-130/80.   I advised [name], that they could continue to monitor the patient's BP on doxazosin 8 mg once daily, but if they are noticing drops in his BP, then it may be worth trying 4 mg BID.   Per [name], he thinks the patient may have had some previous drops in his BP during the day, but he will discuss with the patient when he comes home as he is currently out.  [name] is aware I will leave a note in the patient's chart- ok to switch doxazosin to 4 mg BID if having low BP's during the day.   [name] voices understanding and was appreciative of the call today.

## 2021-10-11 ENCOUNTER — Telehealth: Payer: Self-pay | Admitting: Cardiovascular Disease

## 2021-10-11 NOTE — Telephone Encounter (Signed)
Forms received and laid on Dr. Donivan Scull desk for review and recommendations.

## 2021-10-11 NOTE — Telephone Encounter (Signed)
Patient came by office Dropped off BP readings to be reviewed - placed in nurse box  Wants to clarify instructions about decreasing medication  Please call to discuss

## 2021-10-11 NOTE — Telephone Encounter (Signed)
Patient states he son received a call from [place] to tell him to cut his medication in half but he doesn't know which one.  Patient states he is on 5 different medication from [place].  He needs to know which one we want him to cut in half.  Please advise.

## 2021-10-12 MED ORDER — ISOSORBIDE MONONITRATE ER 60 MG PO TB24: 60.0000 mg | ORAL_TABLET | Freq: Every day | ORAL | 3 refills | Status: DC

## 2021-10-12 NOTE — Telephone Encounter (Signed)
Patient came into office and would not leave without seeing someone to clarify his medications. We discussed his medications in great detail and he reported taking a 8 mg doxazosin last night due to elevated blood pressures. He also states someone called him  with medication changes and he could not remember what that was about. Once patient left the office Dr. Rockey Situ came in to the office and I reviewed this information with him. Verbal orders were to take Doxazosin 8 mg once daily in the AM then isosorbide mononitrate 60 mg at bedtime and 1/2 pill of doxazosin if SBP is greater than 160.   Called and spoke with patient with recommendations from provider. He verbalized understanding and was agreeable with this plan. He repeated all instructions back to me in detail and was very clear on these instructions.   Patient verbalized understanding with no further questions at this time.

## 2021-10-12 NOTE — Telephone Encounter (Signed)
Attempted to call the pt back but unable to leave a message... voicemail not set up yet... will try again at a later time.    From last nurse note:   Timothy Riley is aware I will leave a note in the patient's chart- ok to switch doxazosin to 4 mg BID if having low BP's during the day.

## 2021-10-12 NOTE — Telephone Encounter (Signed)
Patient in office lobby Would like clarification on medication Please advise

## 2021-10-13 MED ORDER — DOXAZOSIN MESYLATE 8 MG PO TABS: 8.0000 mg | ORAL_TABLET | ORAL | 3 refills | Status: DC

## 2021-10-13 NOTE — Telephone Encounter (Signed)
No answer. No voicemail. 

## 2021-10-13 NOTE — Telephone Encounter (Signed)
No answer/No voicemail box set up.  

## 2021-10-13 NOTE — Telephone Encounter (Signed)
Medication list and blood pressure information sent to patient.

## 2021-10-18 NOTE — Telephone Encounter (Signed)
Left voicemail message to call back  

## 2021-10-18 NOTE — Telephone Encounter (Signed)
Spoke with patients son per release form. He reports that they have his medications straightened out and has no questions at this time. Reviewed that I did mail him a packet of information and updated medication list. He will watch for that to come in and will give [place] call back if he should have any questions. He was appreciative for the call, verbalized understanding of our conversation, and no further needs at this time.

## 2021-10-20 ENCOUNTER — Ambulatory Visit: Payer: Medicare Other | Attending: Medical | Admitting: Medical

## 2021-10-20 ENCOUNTER — Ambulatory Visit: Payer: Medicare Other | Admitting: Physician Assistant

## 2021-10-20 ENCOUNTER — Encounter: Payer: Self-pay | Admitting: Medical

## 2021-10-20 VITALS — BP 166/70 | HR 60 | Ht 68.0 in | Wt 173.6 lb

## 2021-10-20 DIAGNOSIS — I10 Essential (primary) hypertension: Secondary | ICD-10-CM

## 2021-10-20 DIAGNOSIS — I25118 Atherosclerotic heart disease of native coronary artery with other forms of angina pectoris: Secondary | ICD-10-CM | POA: Diagnosis not present

## 2021-10-20 DIAGNOSIS — I4819 Other persistent atrial fibrillation: Secondary | ICD-10-CM

## 2021-10-20 DIAGNOSIS — N186 End stage renal disease: Secondary | ICD-10-CM

## 2021-10-20 NOTE — Progress Notes (Unsigned)
Cardiology Office Note:    Date:  10/21/2021   ID:  Timothy Riley, DOB 1940-08-19, MRN [id number]  PCP:  Derinda Late, MD   [facility] Cardiologist:  Ida Rogue, MD  [facility] Electrophysiologist:  None   Referring MD: Derinda Late, MD   Chief Complaint: elevated BP  History of Present Illness:    Timothy Riley is a 81 y.o. male with a hx of persistent Afib, former smoker, severe AS s/p AVR 03/2014 at [facility] with bovine valve, SVT since AVR requirign ablation 06/2014 at 40, DM2, BPH, HTN, HFrEF, ESRD previously on HD.   He was previously followed by [facility] cardiology.  Echo in 2018 demonstrated an EF of 55 to 65%, mild to moderate LVH, hypokinesis of the anteroseptal myocardium, grade 1 diastolic dysfunction, bioprosthetic aortic valve, mildly dilated left atrium, normal RV systolic function and PASP.  Outpatient cardiac monitoring in 2019 showed a 100% A-fib burden with 1 run of NSVT lasting 14.9 seconds.  Nuclear stress test in 03/2018 was low risk and showed no evidence of ischemia or scar.  Echo in 2022 demonstrated an EF of 50 to 34%, grade 2 diastolic dysfunction, normal RV systolic function and ventricular cavity size, moderately elevated PASP, moderately dilated left atrium, mildly dilated right atrium, mild mitral vegetation, bioprosthetic aortic valve with mild to moderate regurgitation.   He was admitted to the hospital in 04/2021 with volume overload.  Echo showed a newly reduced EF of 30 to 35%, global hypokinesis, moderately dilated LV internal cavity size, moderately reduced RV systolic function with moderately large RV cavity size, PASP 45 mmHg, moderately dilated left atrium, moderate mitral regurgitation, moderate tricuspid regurgitation, bioprosthetic aortic valve without evidence of regurgitation or stenosis, and an estimated right atrial pressure of 15 mmHg.  He underwent successful DCCV.  He underwent repeat limited echo during the admission which showed a  persistent cardiomyopathy with an EF of 30% and improved PA pressure of 38.4 mmHg.  Admission was complicated by acute renal failure requiring hemodialysis.   He was seen in hospital follow-up by his primary cardiologist in 05/2021 and was maintaining sinus rhythm.  Subsequent Lexiscan MPI on 07/11/2021 showed a moderate in size, moderate in severity, fixed apical inferior, apical lateral, and apical defect most consistent with scar versus artifact.  There was no evidence of significant ischemia.  LVEF 30 to 35% with global hypokinesis.  Coronary artery calcification, aortic atherosclerosis, and aortic valve prosthesis were noted on CT attenuated corrected images.  A small right pleural effusion was present.  Continued medical therapy was recommended, particularly in light of the patient's underlying renal dysfunction.   He was admitted to the hospital from 7/15 through 7/16 with decreased state of consciousness with concern for possible syncope after noted to be veering on the road.  He was found to have a heart rate of 32 bpm and a glucose of 63 in the field.  Initial EKG showed sinus bradycardia with a rate of 47 bpm.  High-sensitivity troponin 18 with a delta of 17.  It was recommended carvedilol be held. Follow-up heart monitor was ordered, this showed NSR with avergae HR of 64bpm, BBB/IVCD, 1 run VT lasting 4 beats, 1 run SVT lasting 11 beats.    Seen in the office August 2023 and orthostatics were positive and lasix was decreased to '40mg'$  daily.   Today, the patient brought BP readings. He had 4 high readings and 2 hypotensive readings. High BP readings occurred at night. He did not realized  he could take an extra cardura for high BP at night. Instructions say he can take cardura '4mg'$  at night for high SBP>160. He denies chest pain or SOB. No lower leg edema, orthopnea, or pnd.   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 Glower MD @ [facility] via R thoracic approach; b. 03/2018 Echo: EF 60-65%, DD. Mildly dil LA. Nl fxn AoV prosthesis. Grad 34mHg.   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 12/2013 Cath ([facility]): LAD 30p, RI 60, otw nl (per CT surgery note from [facility]; b. 03/2018 MV: EF 59%, no ischemia/scar.   Persistent atrial fibrillation ([place] 10/2017   a. CHA2DS2VASc = 5-->Eliquis; b. 12/2017 loaded w/amio-->DCCV.   PSVT (paroxysmal supraventricular tachycardia) ([place]    a. 05/2014 s/p RFCA @ [facility].   Right inguinal hernia    Urinary incontinence     Past Surgical History:  Procedure Laterality Date   AORTIC VALVE REPLACEMENT     CARDIAC CATHETERIZATION     CARDIOVERSION N/A 01/01/2018   Procedure: CARDIOVERSION;  Surgeon: GMinna Merritts MD;  [facility];  Service: Cardiovascular;  Laterality: N/A;   CARDIOVERSION N/A 05/16/2021   Procedure: CARDIOVERSION;  Surgeon: ENelva Bush MD;  [facility];  Service: Cardiovascular;  Laterality: N/A;   CARDIOVERSION N/A 05/17/2021   Procedure: CARDIOVERSION;  Surgeon: AKate Sable MD;  Location: [facility];  Service: Cardiovascular;  Laterality: N/A;   COLONOSCOPY  2010   DIALYSIS/PERMA CATHETER INSERTION N/A 05/26/2021   Procedure: DIALYSIS/PERMA CATHETER INSERTION;  Surgeon: SKatha Cabal MD;  [facility];  Service: Cardiovascular;  Laterality: N/A;   DIALYSIS/PERMA CATHETER REMOVAL N/A 08/04/2021   Procedure: DIALYSIS/PERMA CATHETER REMOVAL;  Surgeon:  SKatha Cabal MD;  [facility];  Service: Cardiovascular;  Laterality: N/A;   heart valve replacment  2015   TEMPORARY DIALYSIS CATHETER N/A 05/23/2021   Procedure: TEMPORARY DIALYSIS CATHETER;  Surgeon: SKatha Cabal MD;  [facility];  Service: Cardiovascular;  Laterality: N/A;   XI ROBOTIC ASSISTED INGUINAL HERNIA REPAIR WITH MESH Right 01/07/2019   Procedure: XI ROBOTIC ASSISTED INGUINAL HERNIA REPAIR WITH MESH;  Surgeon: RRonny Bacon MD;  Location: [facility];  Service: General;  Laterality: Right;    Current Medications: Current Meds  Medication Sig   amiodarone (PACERONE) 200 MG tablet Take 1 tablet (200 mg total) by mouth daily.   doxazosin (CARDURA) 8 MG tablet Take 1 tablet (8 mg total) by mouth as directed. Take 1 tablet (8 mg) in the morning. At night if your Systolic blood pressure (Top number) is greater that 160 then take half a tablet (4 mg once).   ELIQUIS 2.5 MG TABS tablet TAKE 1 TABLET BY MOUTH TWICE A DAY   furosemide (LASIX) 80 MG tablet Take 0.5 tablets (40 mg total) by mouth daily.   isosorbide mononitrate (IMDUR) 60 MG 24 hr tablet  Take 1 tablet (60 mg total) by mouth daily.   rosuvastatin (CRESTOR) 10 MG tablet Take 1 tablet (10 mg total) by mouth daily.     Allergies:   Gabapentin, Hydralazine, and Hydralazine hcl   Social History   Socioeconomic History   Marital status: Married    Spouse name: betty   Number of children: 2   Years of education: 12   Highest education level: High school graduate  Occupational History   Occupation: retired    Comment: truck Geophysicist/field seismologist  Tobacco Use   Smoking status: Former    Packs/day: 0.50    Years: 1.00    Total pack years: 0.50    Types: Cigarettes    Quit date: 01/30/1972    Years since quitting: 49.7   Smokeless tobacco: Never   Tobacco comments:    over 66 yrs ago  Vaping Use   Vaping Use: Never used  Substance and Sexual Activity   Alcohol use: No     Alcohol/week: 0.0 standard drinks of alcohol   Drug use: No   Sexual activity: Not Currently  Other Topics Concern   Not on file  Social History Narrative   Not on file   Social Determinants of Health   Financial Resource Strain: Low Risk  (11/05/2017)   Overall Financial Resource Strain (CARDIA)    Difficulty of Paying Living Expenses: Not hard at all  Food Insecurity: No Food Insecurity (11/05/2017)   Hunger Vital Sign    Worried About Running Out of Food in the Last Year: Never true    [place] in the Last Year: Never true  Transportation Needs: No Transportation Needs (11/05/2017)   PRAPARE - Hydrologist (Medical): No    Lack of Transportation (Non-Medical): No  Physical Activity: Inactive (11/05/2017)   Exercise Vital Sign    Days of Exercise per Week: 0 days    Minutes of Exercise per Session: 0 min  Stress: No Stress Concern Present (11/05/2017)   [place]    Feeling of Stress : Not at all  Social Connections: Moderately Isolated (11/05/2017)   Social Connection and Isolation Panel [NHANES]    Frequency of Communication with Friends and Family: Once a week    Frequency of Social Gatherings with Friends and Family: Once a week    Attends Religious Services: Never    Marine scientist or Organizations: No    Attends Music therapist: Never    Marital Status: Married     Family History: The patient's family history includes Cancer in his mother; Stroke in his father.  ROS:   Please see the history of present illness.     All other systems reviewed and are negative.  EKGs/Labs/Other Studies Reviewed:    The following studies were reviewed today:  Lexiscan MPI 07/11/2021:   Abnormal pharmacologic myocardial perfusion stress test.   There is a moderate in size, moderate in severity, fixed apical inferior, apical lateral, and apical defect most  consistent with scar but cannot rule out artifact.   There is no evidence of significant ischemia.   Left ventricular systolic function is moderately to severely reduced (LVEF 30-35%) with global hypokinesis.   Caronary artery calcification, aortic atherosclerosis, and aortic valve prosthesis are seen on the attenuation correction CT.   Small right pleural effusion is present. Asymmetric breast densities (right > left) are incidentally noted; clincial correlation recommended.   Compared  to the prior study on 03/18/2018, the fixed apical defect is new.  LVEF has also decreased.   This is an intermediate to high risk study, primarily due to low LVEF. __________   Limited echo 05/18/2021: 1. Left ventricular ejection fraction, by estimation, is 30 %. The left  ventricle has moderately decreased function. The left ventricle  demonstrates global hypokinesis.   2. Right ventricular systolic function is moderately reduced. The right  ventricular size is mildly enlarged. There is mildly elevated pulmonary  artery systolic pressure. The estimated right ventricular systolic  pressure is 97.6 mmHg.   3. Left atrial size was mildly dilated.   4. The mitral valve is normal in structure. No evidence of mitral valve  regurgitation.   5. Tricuspid valve regurgitation is mild to moderate.   6. The aortic valve was not well visualized. Aortic valve regurgitation  is not visualized.   7. The inferior vena cava is normal in size with greater than 50%  respiratory variability, suggesting right atrial pressure of 3 mmHg. __________   2D echo 05/14/2021: 1. Left ventricular ejection fraction, by estimation, is 30 to 35%. The  left ventricle has moderately decreased function. The left ventricle  demonstrates global hypokinesis. The left ventricular internal cavity size  was moderately dilated. Left  ventricular diastolic parameters are indeterminate.   2. Right ventricular systolic function is moderately  reduced. The right  ventricular size is moderately enlarged. There is moderately elevated  pulmonary artery systolic pressure. The estimated right ventricular  systolic pressure is 73.4 mmHg.   3. Left atrial size was moderately dilated.   4. The mitral valve is normal in structure. Moderate mitral valve  regurgitation. No evidence of mitral stenosis.   5. Tricuspid valve regurgitation is moderate.   6. The aortic valve has been repaired/replaced. s/p Borive AVR 03/2014,  appropritae gradient noted, max gradient 10 mm Hg. Aortic valve  regurgitation is not visualized. No aortic stenosis is present.   7. The inferior vena cava is dilated in size with <50% respiratory  variability, suggesting right atrial pressure of 15 mmHg. __________   2D echo 09/13/2020: 1. Left ventricular ejection fraction, by estimation, is 50 to 55%. The  left ventricle has low normal function. Left ventricular endocardial  border not optimally defined to evaluate regional wall motion. There is  mild left ventricular hypertrophy. Left  ventricular diastolic parameters are consistent with Grade II diastolic  dysfunction (pseudonormalization). Elevated left atrial pressure.   2. Right ventricular systolic function is normal. The right ventricular  size is normal. There is moderately elevated pulmonary artery systolic  pressure.   3. Left atrial size was moderately dilated.   4. Right atrial size was mildly dilated.   5. The mitral valve is abnormal. Mild mitral valve regurgitation. No  evidence of mitral stenosis.   6. Tricuspid valve regurgitation is mild to moderate.   7. The aortic valve has been repaired/replaced. Aortic valve  regurgitation is mild to moderate. There is a 23 mm Edwards bioprosthetic  valve present in the aortic position. Procedure Date: 03/01/14. Aortic valve  mean gradient measures 13.0 mmHg.   8. The inferior vena cava is dilated in size with >50% respiratory  variability, suggesting right  atrial pressure of 8 mmHg. __________   Carlton Adam MPI 03/18/2018: Normal pharmacologic myocardial perfusion stress test without ischemia or scar. The left ventricular ejection fraction is normal by visual estimation and Siemens calculation (59%). LVEF by QGS is likely reduced due to gating artifact (42%).  This is a low risk study. __________   2D echo 03/14/2018: 1. The left ventricle has normal systolic function with an ejection  fraction of 60-65%. The cavity size was normal. There is mildly increased  left ventricular wall thickness. Left ventricular diastolic Doppler  parameters are consistent with impaired  relaxation.   2. The right ventricle has normal systolic function. The cavity was  normal. There is no increase in right ventricular wall thickness.   3. Left atrial size was mildly dilated.   4. 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  Procedure Date: 03/01/14 Normal aortic valve prosthesis. AV Mean Grad: 16.0  mmHg   5. Right atrial pressure is estimated at 10 mmHg. __________   Elwyn Reach patch 10/2017: Persistent atrial fibrillation occurred continuously (100% burden), ranging from 58-152 bpm (avg of 95 bpm).   1 run of Ventricular Tachycardia occurred lasting 14.9 secs with a max rate of 193 bpm (avg 161 bpm).  5:46 AM No patient trigger noted   Isolated VEs were rare (<1.0%), VE Couplets were rare (<1.0%), and no VE Triplets were present. __________   2D echo 11/13/2017: - Left ventricle: The cavity size was normal. Wall thickness was    increased in a pattern of moderate LVH. Systolic function was    mildly reduced. The estimated ejection fraction was in the range    of 45% to 50%. Diffuse hypokinesis.  - Aortic valve: A bioprosthesis was present. Transvalvular velocity    was minimally increased. There was mild stenosis. There was mild    regurgitation. Mean gradient (S): 13 mm Hg.  - Mitral valve: Calcified annulus. There was mild  regurgitation.  - Left atrium: The atrium was mildly dilated.  - Right ventricle: The cavity size was mildly dilated. Systolic    function was mildly reduced.  - Right atrium: The atrium was mildly dilated.  - Tricuspid valve: Poorly visualized. There was mild-moderate    regurgitation.  - Inferior vena cava: The vessel was dilated. The respirophasic    diameter changes were in the normal range (>= 50%), consistent    with mildly elevated central venous pressure. __________   2D echo 04/23/2016: - Left ventricle: The cavity size was normal. Wall thickness was    increased increased in a pattern of mild to moderate LVH.    Systolic function was normal. The estimated ejection fraction was    in the range of 55% to 65%. Hypokinesis of the anteroseptal    myocardium. Doppler parameters are consistent with abnormal left    ventricular relaxation (grade 1 diastolic dysfunction).  - Aortic valve: A bioprosthesis was present. Transvalvular velocity    was increased. There was mild stenosis. There was trivial    regurgitation. Peak velocity (S): 277 cm/s. Mean gradient (S): 18    mm Hg. Peak gradient (S): 31 mm Hg.  - Left atrium: The atrium was mildly dilated.  - Right ventricle: Systolic function was normal.  - Pulmonary arteries: Systolic pressure was within the normal    range.    EKG:  EKG is ordered today.  The ekg ordered today demonstrates NSR 60bpm, LAD, RBBB, nonspecific ST/T wave changes  Recent Labs: 05/13/2021: B Natriuretic Peptide 1,597.6 05/28/2021: Magnesium 2.0 08/12/2021: ALT 20; TSH 5.608 08/13/2021: BUN 41; Creatinine, Ser 2.25; Hemoglobin 10.6; Platelets 160; Potassium 3.4; Sodium 142  Recent Lipid Panel    Component Value Date/Time   CHOL 135 12/23/2018 0000   TRIG 185 (H) 12/23/2018 0000  HDL 28 (L) 12/23/2018 0000   CHOLHDL 4.8 12/23/2018 0000   VLDL 70 (H) 06/29/2016 1408   LDLCALC 79 12/23/2018 0000    Physical Exam:    VS:  BP (!) 166/70 (BP Location:  Left Arm, Patient Position: Sitting, Cuff Size: Normal)   Pulse 60   Ht '5\' 8"'$  (1.727 m)   Wt 173 lb 9.6 oz (78.7 kg)   SpO2 98%   BMI 26.40 kg/m     Wt Readings from Last 3 Encounters:  10/20/21 173 lb 9.6 oz (78.7 kg)  10/03/21 171 lb 6 oz (77.7 kg)  08/30/21 169 lb (76.7 kg)     GEN:  Well nourished, well developed in no acute distress HEENT: Normal NECK: No JVD; No carotid bruits LYMPHATICS: No lymphadenopathy CARDIAC: RRR, no murmurs, rubs, gallops RESPIRATORY:  Clear to auscultation without rales, wheezing or rhonchi  ABDOMEN: Soft, non-tender, non-distended MUSCULOSKELETAL:  No edema; No deformity  SKIN: Warm and dry NEUROLOGIC:  Alert and oriented x 3 PSYCHIATRIC:  Normal affect   ASSESSMENT:    1. Essential hypertension   2. Coronary artery disease of native artery of native heart with stable angina pectoris (HCC)   3. Persistent atrial fibrillation ([place])   4. ESRD (end stage renal disease) ([place])    PLAN:    In order of problems listed above:  HTN Patient reports high blood pressures mostly at night. He didn't realize he could take an extra Cardura '4mg'$  if BP is high. In general however, BP has been good at home. He will take an extra Cardura 39m if systolics>160. Continue Imdur '60mg'$  daily and Cardura '8mg'$  daily.   Persistent Afib He remains in NSR with amiodarone '200mg'$  daily. Continue Eliquis 2.'5mg'$  BID for stroke ppx.   CKD previosuly on HD He no longer needs dialysis, he will continue to follow with nephrology.   Chronic diastolic heart failure He is euvolemic on exam today. Continue lasix '40mg'$  daily. E is not on GDMT due to baseline bradycardia and CKD.   Disposition: Follow up in 4 month(s) with MD/APP     Signed, Hamdi Kley HNinfa Meeker PA-C  10/21/2021 6:46 PM    [facility]

## 2021-10-20 NOTE — Patient Instructions (Signed)
Medication Instructions:  Your physician recommends that you continue on your current medications as directed. Please refer to the Current Medication list given to you today.  *If you need a refill on your cardiac medications before your next appointment, please call your pharmacy*   Lab Work: None ordered If you have labs (blood work) drawn today and your tests are completely normal, you will receive your results only by: [place] (if you have MyChart) OR A paper copy in the mail If you have any lab test that is abnormal or we need to change your treatment, we will call you to review the results.   Testing/Procedures: None ordered   Follow-Up: At [facility], you and your health needs are our priority.  As part of our continuing mission to provide you with exceptional heart care, we have created designated Provider Care Teams.  These Care Teams include your primary Cardiologist (physician) and Advanced Practice Providers (APPs -  Physician Assistants and Nurse Practitioners) who all work together to provide you with the care you need, when you need it.  We recommend signing up for the patient portal called "MyChart".  Sign up information is provided on this After Visit Summary.  MyChart is used to connect with patients for Virtual Visits (Telemedicine).  Patients are able to view lab/test results, encounter notes, upcoming appointments, etc.  Non-urgent messages can be sent to your provider as well.   To learn more about what you can do with MyChart, go to [email or web address].    Your next appointment:   3 month(s)  The format for your next appointment:   In Person  Provider:   You may see Ida Rogue, MD or one of the following Advanced Practice Providers on your designated Care Team:   Murray Hodgkins, NP Christell Faith, PA-C Cadence Kathlen Mody, PA-C Gerrie Nordmann, NP    Other Instructions N/A  Important Information About Sugar

## 2021-11-02 NOTE — Telephone Encounter (Signed)
Pt c/o medication issue:  1. Name of Medication:   doxazosin (CARDURA) 8 MG tablet  2. How are you currently taking this medication (dosage and times per day)?   As prescribed  3. Are you having a reaction (difficulty breathing--STAT)?   No  4. What is your medication issue?   Patient stated his BP is still running high even when he is taking a full tablet in the morning and a full tablet at night.  Patient would like to know next steps.

## 2021-11-03 NOTE — Telephone Encounter (Signed)
Left message for patient to call back  

## 2021-11-03 NOTE — Telephone Encounter (Signed)
Left voicemail message with appointment time for Monday with provider and number to call back if any questions. Will try back later.

## 2021-11-03 NOTE — Telephone Encounter (Signed)
Patient showed up in office reporting continued elevated blood pressures. Yesterday he admitted taking Doxazosin 8 mg twice. Reviewed Dr. Donivan Scull recommendations in detail and he repeated those instructions back. Printed out those instructions so that he has them to review. He then discussed elevated blood pressures being as high SBP 200. Advised it would be best if we got him scheduled to come in so that his medications can be discussed in detail and review how his blood pressures are running. He was agreeable with this plan and advised I would give him a call later to set that up. He verbalized understanding with no further questions at this time.

## 2021-11-04 NOTE — Progress Notes (Unsigned)
Cardiology Office Note  Date:  11/06/2021   ID:  Timothy Riley, DOB January 27, 1941, MRN [id number]  PCP:  Derinda Late, MD   Chief Complaint  Patient presents with   Other    Elevated BP. Meds reviewed verbally with pt.    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 In general however, BP has been good at home. He will take an extra Cardura 27m if systolics>160. Continue Imdur '60mg'$  daily and Cardura '8mg'$  daily  Seen by nephrology October 3 blood pressure 138/70  On his visit today he reports blood pressure continues to run high, has been recording markedly elevated blood pressures in the evenings often 1784systolic and higher, Morning pressures are labile from 1696systolic up to 1295systolic, on average 1284up to 1132systolic  Reports he is taking Cardura 8 mg twice daily, isosorbide 60 mg in the evening  Denies significant shortness of breath on exertion, no leg edema Very active at baseline, reports spending long days working on his property, lifting heavy rocks, shoveling soil  EKG personally reviewed by myself on todays visit Normal sinus rhythm rate 62 bpm right bundle branch block nonspecific T wave abnormality  Hospitalized April 2023 CHF, EF at that time 30 to 35% in the setting of atrial fibrillation Underwent  cardioversion EF low 344%Admission complicated by acute renal failure requiring hemodialysis Completed HD for 6 weeks Catheter was removed as renal function improved  Myoview July 11, 2021 no significant ischemia, moderate fixed defect noted apical inferior, apical lateral and apical region  Hospitalized June 15 encephalopathy, possible syncope, heart rate 32 glucose 63 in the field Carvedilol held  hospital July 2023, syncope  heart rate of 32 and glucose of 63.  Brought into the emergency room and found to have acute kidney injury with creatinine of 2.56  Glipizide and Actos discontinued  Orthostatics positive 08/30/21 in the office Lasix down to 40 daily  Zio monitor August 2023 Normal sinus rhythm Patient had a min HR of 44 bpm, max HR of 179 bpm, and avg HR of 64 bpm.  Bundle Branch Block/IVCD was present.    1 run of Ventricular Tachycardia occurred lasting 4 beats with a max rate of 179 bpm (avg 148 bpm).  Not triggered   1 run of Supraventricular Tachycardia occurred lasting 11 beats with a max rate of 107 bpm (avg 95 bpm).  Isolated SVEs were rare (<1.0%), SVE Couplets were rare (<1.0%), and SVE Triplets were rare (<1.0%).  Isolated VEs were rare (<1.0%), and no VE Couplets or VE Triplets were present.   Patient triggered event associated with normal sinus rhythm  Echo 03/2018,   1. The left ventricle has normal systolic function with an ejection fraction of 60-65%. The cavity size was normal. There is mildly increased left ventricular wall thickness. Left ventricular diastolic  Doppler parameters are consistent with impaired  relaxation.  2. The right ventricle has normal systolic function. The cavity was normal. There is no increase in right ventricular wall thickness.  3. Left atrial size was mildly dilated.  4. 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 Procedure Date: 03/01/14 Normal aortic valve prosthesis. AV Mean Grad: 16.0 mmHg  5. 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  PSH:    Past Surgical History:  Procedure Laterality Date   AORTIC VALVE REPLACEMENT     CARDIAC CATHETERIZATION     CARDIOVERSION N/A 01/01/2018   Procedure: CARDIOVERSION;  Surgeon: Minna Merritts, MD;  Location: [facility];  Service: Cardiovascular;  Laterality: N/A;   CARDIOVERSION N/A 05/16/2021   Procedure: CARDIOVERSION;  Surgeon: Nelva Bush, MD;  Location: [facility];  Service: Cardiovascular;  Laterality: N/A;   CARDIOVERSION N/A 05/17/2021   Procedure: CARDIOVERSION;  Surgeon: Kate Sable, MD;  Location: [facility];  Service: Cardiovascular;  Laterality: N/A;   COLONOSCOPY  2010   DIALYSIS/PERMA CATHETER INSERTION N/A 05/26/2021   Procedure: DIALYSIS/PERMA CATHETER INSERTION;  Surgeon: Katha Cabal, MD;  Location: [place] [facility];  Service: Cardiovascular;  Laterality: N/A;   DIALYSIS/PERMA CATHETER REMOVAL N/A 08/04/2021    Procedure: DIALYSIS/PERMA CATHETER REMOVAL;  Surgeon: Katha Cabal, MD;  Location: [place] [facility];  Service: Cardiovascular;  Laterality: N/A;   heart valve replacment  2015   TEMPORARY DIALYSIS CATHETER N/A 05/23/2021   Procedure: TEMPORARY DIALYSIS CATHETER;  Surgeon: Katha Cabal, MD;  Location: [place] [facility];  Service: Cardiovascular;  Laterality: N/A;   XI ROBOTIC ASSISTED INGUINAL HERNIA REPAIR WITH MESH Right 01/07/2019   Procedure: XI ROBOTIC ASSISTED INGUINAL HERNIA REPAIR WITH MESH;  Surgeon: Ronny Bacon, MD;  Location: [facility];  Service: General;  Laterality: Right;    Current Outpatient Medications  Medication Sig Dispense Refill   amiodarone (PACERONE) 200 MG tablet Take 1 tablet (200 mg total) by mouth daily. 90 tablet 0   doxazosin (CARDURA) 8 MG tablet Take 1 tablet (8 mg total) by mouth as directed. Take 1 tablet (8 mg) in the morning. At night if your Systolic blood pressure (Top number) is greater that 160 then take half a tablet (4 mg once). 60 tablet 3   ELIQUIS 2.5 MG TABS tablet TAKE 1 TABLET BY MOUTH TWICE A DAY 180 tablet 1   furosemide (LASIX) 80 MG tablet Take 0.5 tablets (40 mg total) by mouth daily.     isosorbide mononitrate (IMDUR) 60 MG 24 hr tablet Take 1 tablet (60 mg total) by mouth daily. 90 tablet 3   rosuvastatin (CRESTOR)  10 MG tablet Take 1 tablet (10 mg total) by mouth daily. 90 tablet 0   No current facility-administered medications for this visit.    Allergies:   Gabapentin, Hydralazine, and Hydralazine hcl   Social History:  The patient  reports that he quit smoking about 49 years ago. His smoking use included cigarettes. He has a 0.50 pack-year smoking history. He has never used smokeless tobacco. He reports that he does not drink alcohol and does not use drugs.   Family History:   family history includes Cancer in his mother; Stroke in his father.   Review of Systems: Review of Systems  Constitutional: Negative.    HENT: Negative.    Respiratory: Negative.    Cardiovascular: Negative.   Gastrointestinal: Negative.   Musculoskeletal: Negative.   Neurological: Negative.   Psychiatric/Behavioral: Negative.    All other systems reviewed and are negative.   PHYSICAL EXAM: VS:  BP (!) 160/68 (BP Location: Left Arm, Patient Position: Sitting, Cuff Size: Normal)   Pulse 62   Ht '5\' 8"'$  (1.727 m)   Wt 174 lb 6 oz (79.1 kg)   SpO2 97%   BMI 26.51 kg/m  , BMI Body mass index is 26.51 kg/m. Constitutional:  oriented to person, place, and time. No distress.  HENT:  Head: Grossly normal Eyes:  no discharge. No scleral icterus.  Neck: No JVD, no carotid bruits  Cardiovascular: Regular rate and rhythm, no murmurs appreciated Pulmonary/Chest: Clear to auscultation bilaterally, no wheezes or rails Abdominal: Soft.  no distension.  no tenderness.  Musculoskeletal: Normal range of motion Neurological:  normal muscle tone. Coordination normal. No atrophy Skin: Skin warm and dry Psychiatric: normal affect, pleasant  Recent Labs: 05/13/2021: B Natriuretic Peptide 1,597.6 05/28/2021: Magnesium 2.0 08/12/2021: ALT 20; TSH 5.608 08/13/2021: BUN 41; Creatinine, Ser 2.25; Hemoglobin 10.6; Platelets 160; Potassium 3.4; Sodium 142    Lipid Panel Lab Results  Component Value Date   CHOL 135 12/23/2018   HDL 28 (L) 12/23/2018   LDLCALC 79 12/23/2018   TRIG 185 (H) 12/23/2018    Wt Readings from Last 3 Encounters:  11/06/21 174 lb 6 oz (79.1 kg)  10/20/21 173 lb 9.6 oz (78.7 kg)  10/03/21 171 lb 6 oz (77.7 kg)     ASSESSMENT AND PLAN:  Atrial fibrillation, persistent Prior cardioversion,  Maintaining normal sinus rhythm Not on beta-blocker secondary to bradycardia Remains on low-dose amiodarone with Eliquis   chronic renal failure, end-stage renal Followed by nephrology,  Previously required hemodialysis 3 days a week Monday Wednesday Friday for 6 weeks, now no longer on hemodialysis, port has been  removed On Lasix 40 daily  Leg swelling No significant swelling on today's visit  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 Ejection fraction 50 to 55% in August 2022 drop in ejection fraction 30 to 35%, no change on repeat echo Myoview with no large regions of ischemia Off carvedilol secondary to bradycardia Not a good candidate for ACE, ARB, Entresto, SGLT2 inhibitor in the setting of renal dysfunction and hypoglycemia Stressed importance of aggressive blood pressure control  HTN Blood pressure continues to be an issue He made a decision to increase his Cardura up to 8 twice daily, also taking isosorbide 60 mg in the evening Blood pressure continues to run high, recommend increase isosorbide up to 60 twice daily Medication options are limited Reports having intolerance to hydralazine as he develops a rash Recommend if pressure goes above 170 on the regimen above that he take amlodipine 5 mg  as needed.   Diabetes: Stable, HBA1C 5.6 Weight stable  Arteriosclerosis of coronary artery - Plan: EKG 12-Lead Not a good candidate for cardiac catheterization given renal dysfunction Myoview has been completed No high risk ischemia  S/P AVR (aortic valve replacement) - Plan: EKG 12-Lead On echo, stable prosthetic valve  Hyperlipidemia Continue Crestor   Total encounter time more than 30 minutes  Greater than 50% was spent in counseling and coordination of care with the patient    No orders of the defined types were placed in this encounter.    Signed, Esmond Plants, M.D., Ph.D. 11/06/2021  [place], [place]

## 2021-11-06 ENCOUNTER — Encounter: Payer: Self-pay | Admitting: Cardiovascular Disease

## 2021-11-06 ENCOUNTER — Ambulatory Visit: Payer: Medicare Other | Attending: Cardiovascular Disease | Admitting: Cardiovascular Disease

## 2021-11-06 VITALS — BP 160/68 | HR 62 | Ht 68.0 in | Wt 174.4 lb

## 2021-11-06 DIAGNOSIS — I25118 Atherosclerotic heart disease of native coronary artery with other forms of angina pectoris: Secondary | ICD-10-CM | POA: Diagnosis not present

## 2021-11-06 DIAGNOSIS — I471 Supraventricular tachycardia, unspecified: Secondary | ICD-10-CM | POA: Diagnosis not present

## 2021-11-06 DIAGNOSIS — I10 Essential (primary) hypertension: Secondary | ICD-10-CM | POA: Diagnosis not present

## 2021-11-06 DIAGNOSIS — E782 Mixed hyperlipidemia: Secondary | ICD-10-CM | POA: Diagnosis not present

## 2021-11-06 DIAGNOSIS — I4819 Other persistent atrial fibrillation: Secondary | ICD-10-CM

## 2021-11-06 DIAGNOSIS — I5022 Chronic systolic (congestive) heart failure: Secondary | ICD-10-CM

## 2021-11-06 DIAGNOSIS — I420 Dilated cardiomyopathy: Secondary | ICD-10-CM

## 2021-11-06 MED ORDER — AMLODIPINE BESYLATE 5 MG PO TABS: ORAL_TABLET | ORAL | 1 refills | Status: DC

## 2021-11-06 MED ORDER — ISOSORBIDE MONONITRATE ER 60 MG PO TB24: ORAL_TABLET | ORAL | 1 refills | Status: DC

## 2021-11-06 NOTE — Patient Instructions (Addendum)
Medication Instructions:  - Your physician has recommended you make the following change in your medication:   1) INCREASE isosorbide up to 60 mg: - take 1 tablet by mouth twice a day  2) START amlodipine 5  mg: - take 1 tablet as needed daily for pressure >170   Samples Given: Eliquis 2.5 mg Lot: [id number] Exp: 03/2022 # 1 box  Lot: [id number] Exp: 04/2022 # 1 box  If you need a refill on your cardiac medications before your next appointment, please call your pharmacy.    Lab work: No new labs needed   Testing/Procedures: No new testing needed   Follow-Up: At [facility], you and your health needs are our priority.  As part of our continuing mission to provide you with exceptional heart care, we have created designated Provider Care Teams.  These Care Teams include your primary Cardiologist (physician) and Advanced Practice Providers (APPs -  Physician Assistants and Nurse Practitioners) who all work together to provide you with the care you need, when you need it.  You will need a follow up appointment in 2 months  Providers on your designated Care Team:   Murray Hodgkins, NP Christell Faith, PA-C Cadence Kathlen Mody, [place]  COVID-19 Vaccine Information can be found at: [email or web address].uk For questions related to vaccine distribution or appointments, please email [email or web address] or call [phone].

## 2021-11-06 NOTE — Telephone Encounter (Signed)
Spoke with patients son and confirmed upcoming appointment for today. He confirmed and had no further questions.

## 2021-11-13 ENCOUNTER — Other Ambulatory Visit: Payer: Self-pay | Admitting: Cardiovascular Disease

## 2021-11-22 ENCOUNTER — Other Ambulatory Visit: Payer: Self-pay | Admitting: Cardiovascular Disease

## 2021-12-18 ENCOUNTER — Other Ambulatory Visit: Payer: Self-pay | Admitting: Cardiovascular Disease

## 2021-12-18 NOTE — Telephone Encounter (Signed)
Refill request

## 2021-12-18 NOTE — Telephone Encounter (Signed)
Prescription refill request for Eliquis received. Indication:afib Last office visit:10/23 Scr:2.2 Age: 81 Weight:79.1  kg  Prescription refilled

## 2022-01-02 ENCOUNTER — Ambulatory Visit: Payer: Medicare Other | Admitting: Physician Assistant

## 2022-01-05 ENCOUNTER — Other Ambulatory Visit: Payer: Self-pay | Admitting: Cardiovascular Disease

## 2022-01-07 NOTE — Progress Notes (Unsigned)
Cardiology Office Note  Date:  01/08/2022   ID:  Timothy Riley, DOB [birth year], MRN [id number]  PCP:  Derinda Late, MD   Chief Complaint  Patient presents with   3 month follow up     "Doing well." Medications reviewed by the patient verbally.     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 at [facility] with bovine valve,  SVT episodes since his AVR, requiring ablation 06/30/2014 at [facility],  diabetes type 2  BPH,  ejection fraction 30% in April 2023 in the setting of atrial fibrillation down from 50% Bradycardia on beta-blockers Chronic renal insufficiency who presents for routine follow-up of his bioprosthetic valve, hypertension , atrial fibrillation  Last seen by myself in clinic October 2023 On his own he had increased Cardura up to 8 twice daily,sosorbide 60 mg in the evening Blood pressure still running high We recommended he increase isosorbide up to 60 twice daily  In follow-up today he reports afternoon and evening blood pressures continue to run high often getting 413 systolic or higher, last night was 244 systolic He is checking blood pressure at 8 PM, takes his evening medications at 9 PM Has been taking amlodipine as needed, also sometimes takes doxazosin 8 mg in the p.m. instead of 4 mg  Otherwise reports he feels well apart from anxiety concerning his high blood pressure  Followed by nephrology for his chronic renal insufficiency  EKG personally reviewed by myself on todays visit Normal sinus rhythm rate 54 bpm right bundle branch block nonspecific T wave abnormality  Other past medical history reviewed Hospitalized April 2023 CHF, EF at that time 30 to 35% in the setting of atrial fibrillation Underwent cardioversion EF low 01% Admission complicated by acute renal failure requiring hemodialysis Completed HD for 6  weeks Catheter was removed as renal function improved  Myoview July 11, 2021 no significant ischemia, moderate fixed defect noted apical inferior, apical lateral and apical region  Hospitalized June 15 encephalopathy, possible syncope, heart rate 32 glucose 63 in the field Carvedilol held  hospital July 2023, syncope  heart rate of 32 and glucose of 63.  Brought into the emergency room and found to have acute kidney injury with creatinine of 2.56  Glipizide and Actos discontinued  Orthostatics positive 08/30/21 in the office Lasix down to 40 daily  Zio monitor August 2023 Normal sinus rhythm Patient had a min HR of 44 bpm, max HR of 179 bpm, and avg HR of 64 bpm.  Bundle Branch Block/IVCD was present.    1 run of Ventricular Tachycardia occurred lasting 4 beats with a max rate of 179 bpm (avg 148 bpm).  Not triggered   1 run of Supraventricular Tachycardia occurred lasting 11 beats with a max rate of 107 bpm (avg 95 bpm).  Isolated SVEs were rare (<1.0%), SVE Couplets were rare (<1.0%), and SVE Triplets were rare (<1.0%).  Isolated VEs were rare (<1.0%), and no VE Couplets or VE Triplets were present.   Patient triggered event associated with normal sinus rhythm  Echo 03/2018,   1. The left ventricle has normal systolic function with an ejection fraction of 60-65%. The cavity size was normal. There is mildly increased left ventricular wall thickness. Left ventricular diastolic Doppler parameters are consistent with impaired  relaxation.  2. The right ventricle has normal systolic function. The cavity was normal. There is  no increase in right ventricular wall thickness.  3. Left atrial size was mildly dilated.  4. 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 Procedure Date: 03/01/14 Normal aortic valve prosthesis. AV Mean Grad: 16.0 mmHg  5. 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  PSH:    Past Surgical History:  Procedure Laterality Date   AORTIC VALVE REPLACEMENT     CARDIAC CATHETERIZATION     CARDIOVERSION N/A 01/01/2018   Procedure: CARDIOVERSION;  Surgeon: Minna Merritts, MD;  Location: [facility] ORS;  Service: Cardiovascular;  Laterality: N/A;   CARDIOVERSION N/A 05/16/2021   Procedure: CARDIOVERSION;  Surgeon: Nelva Bush, MD;  Location: [facility] OR[facility];  Service: Cardiovascular;  Laterality: N/A;   CARDIOVERSION N/A 05/17/2021   Procedure: CARDIOVERSION;  Surgeon: Kate Sable, MD;  Location: [facility] ORS;  Service: Cardiovascular;  Laterality: N/A;   COLONOSCOPY  2010   DIALYSIS/PERMA CATHETER INSERTION N/A 05/26/2021   Procedure: DIALYSIS/PERMA CATHETER INSERTION;  Surgeon: Katha Cabal, MD;  Location: [place] [facility];  Service: Cardiovascular;  Laterality: N/A;   DIALYSIS/PERMA CATHETER REMOVAL N/A 08/04/2021   Procedure: DIALYSIS/PERMA CATHETER REMOVAL;  Surgeon: Katha Cabal, MD;  Location: [place] [facility];   Service: Cardiovascular;  Laterality: N/A;   heart valve replacment  2015   TEMPORARY DIALYSIS CATHETER N/A 05/23/2021   Procedure: TEMPORARY DIALYSIS CATHETER;  Surgeon: Katha Cabal, MD;  Location: [place] [facility];  Service: Cardiovascular;  Laterality: N/A;   XI ROBOTIC ASSISTED INGUINAL HERNIA REPAIR WITH MESH Right 01/07/2019   Procedure: XI ROBOTIC ASSISTED INGUINAL HERNIA REPAIR WITH MESH;  Surgeon: Ronny Bacon, MD;  Location: [facility];  Service: General;  Laterality: Right;    Current Outpatient Medications  Medication Sig Dispense Refill   amiodarone (PACERONE) 200 MG tablet TAKE 1 TABLET BY MOUTH ONCE DAILY 90 tablet 0   amLODipine (NORVASC) 5 MG tablet Take 1 tablet (5 mg) by mouth once daily as needed for systolic (top number) blood pressure > 170 30 tablet 1   doxazosin (CARDURA) 8 MG tablet Take 1 tablet (8 mg total) by mouth as directed. Take 1 tablet (8 mg) in the morning. At night if your Systolic blood pressure (Top number) is greater that 160 then take half a tablet (4 mg once). 60 tablet 3   ELIQUIS 2.5 MG TABS tablet TAKE 1 TABLET BY MOUTH TWICE A DAY 180 tablet 1   furosemide (LASIX) 80 MG tablet Take 0.5 tablets (40 mg total) by mouth daily. 45 tablet 0   glipiZIDE (GLUCOTROL XL) 5 MG 24 hr tablet Take 5 mg by mouth daily with breakfast.  isosorbide mononitrate (IMDUR) 60 MG 24 hr tablet Take 1 tablet (60 mg) by mouth twice daily 180 tablet 1   levothyroxine (SYNTHROID) 25 MCG tablet Take 25 mcg by mouth daily before breakfast.     rosuvastatin (CRESTOR) 10 MG tablet Take 1 tablet (10 mg total) by mouth daily. 90 tablet 0   No current facility-administered medications for this visit.    Allergies:   Gabapentin, Hydralazine, and Hydralazine hcl   Social History:  The patient  reports that he quit smoking about 49 years ago. His smoking use included cigarettes. He has a 0.50 pack-year smoking history. He has never used smokeless tobacco. He reports that  he does not drink alcohol and does not use drugs.   Family History:   family history includes Cancer in his mother; Stroke in his father.   Review of Systems: Review of Systems  Constitutional: Negative.   HENT: Negative.    Respiratory: Negative.    Cardiovascular: Negative.   Gastrointestinal: Negative.   Musculoskeletal: Negative.   Neurological: Negative.   Psychiatric/Behavioral: Negative.    All other systems reviewed and are negative.   PHYSICAL EXAM: VS:  BP (!) 190/80 (BP Location: Left Arm, Patient Position: Sitting, Cuff Size: Normal)   Pulse (!) 54   Ht '5\' 8"'$  (1.727 m)   Wt 174 lb 6 oz (79.1 kg)   SpO2 96%   BMI 26.51 kg/m  , BMI Body mass index is 26.51 kg/m. Constitutional:  oriented to person, place, and time. No distress.  HENT:  Head: Grossly normal Eyes:  no discharge. No scleral icterus.  Neck: No JVD, no carotid bruits  Cardiovascular: Regular rate and rhythm, no murmurs appreciated Pulmonary/Chest: Clear to auscultation bilaterally, no wheezes or rails Abdominal: Soft.  no distension.  no tenderness.  Musculoskeletal: Normal range of motion Neurological:  normal muscle tone. Coordination normal. No atrophy Skin: Skin warm and dry Psychiatric: normal affect, pleasant  Recent Labs: 05/13/2021: B Natriuretic Peptide 1,597.6 05/28/2021: Magnesium 2.0 08/12/2021: ALT 20; TSH 5.608 08/13/2021: BUN 41; Creatinine, Ser 2.25; Hemoglobin 10.6; Platelets 160; Potassium 3.4; Sodium 142    Lipid Panel Lab Results  Component Value Date   CHOL 135 12/23/2018   HDL 28 (L) 12/23/2018   LDLCALC 79 12/23/2018   TRIG 185 (H) 12/23/2018    Wt Readings from Last 3 Encounters:  01/08/22 174 lb 6 oz (79.1 kg)  11/06/21 174 lb 6 oz (79.1 kg)  10/20/21 173 lb 9.6 oz (78.7 kg)     ASSESSMENT AND PLAN:  Atrial fibrillation, persistent Prior cardioversion,  Maintaining normal sinus rhythm Not on beta-blocker secondary to bradycardia Recommend he continue  amiodarone with Eliquis  chronic renal failure, end-stage renal Followed by nephrology,  Previously required hemodialysis 3 days a week Monday Wednesday Friday for 6 weeks, now no longer on hemodialysis, port has been removed On Lasix 40 daily, renal function stable  Leg swelling No significant swelling on today's visit, appears euvolemic  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 Ejection fraction 50 to 55% in August 2022 drop in ejection fraction 30 to 35%, no change on repeat echo Myoview with no large regions of ischemia Off carvedilol secondary to bradycardia Not a good candidate for ACE, ARB, Entresto, SGLT2 inhibitor in the setting of renal dysfunction and hypoglycemia Again we will focus on blood pressure control which continues to run high  HTN Blood pressure continues to be an issue He has been taking Cardura 8 mg in the morning isosorbide 60 mg  in the morning around 1030 to 11 AM when he wakes up Has been taking isosorbide 60 with Cardura 4 up to 8 mg at 9 PM Blood pressure running markedly high at x 191 systolic We have recommended he add amlodipine 5 mg at 5 PM If blood pressure continues to run high on his evening checks will likely need to increase Cardura up to 8 mg twice daily and suggested he may need to take his evening medications earlier at 8 PM if numbers are high  Diabetes: A1c running higher 8.6, recent medication changes with primary care Unclear of medication compliance and issue  Arteriosclerosis of coronary artery - Plan: EKG 12-Lead Not a good candidate for cardiac catheterization given renal dysfunction Myoview has been completed No high risk ischemia Denies significant chest pain concerning for angina  S/P AVR (aortic valve replacement) - Plan: EKG 12-Lead On echo, stable prosthetic valve  Hyperlipidemia Continue Crestor   Total encounter time more than 30 minutes  Greater than 50% was spent in counseling and coordination of care  with the patient    No orders of the defined types were placed in this encounter.    Signed, Esmond Plants, M.D., Ph.D. 01/08/2022  [place], [place]

## 2022-01-08 ENCOUNTER — Encounter: Payer: Self-pay | Admitting: Cardiovascular Disease

## 2022-01-08 ENCOUNTER — Ambulatory Visit: Payer: Medicare Other | Attending: Physician Assistant | Admitting: Cardiovascular Disease

## 2022-01-08 VITALS — BP 150/65 | HR 54 | Ht 68.0 in | Wt 174.4 lb

## 2022-01-08 DIAGNOSIS — I4819 Other persistent atrial fibrillation: Secondary | ICD-10-CM

## 2022-01-08 DIAGNOSIS — I25118 Atherosclerotic heart disease of native coronary artery with other forms of angina pectoris: Secondary | ICD-10-CM | POA: Diagnosis not present

## 2022-01-08 DIAGNOSIS — I420 Dilated cardiomyopathy: Secondary | ICD-10-CM

## 2022-01-08 DIAGNOSIS — I10 Essential (primary) hypertension: Secondary | ICD-10-CM | POA: Diagnosis not present

## 2022-01-08 DIAGNOSIS — I471 Supraventricular tachycardia, unspecified: Secondary | ICD-10-CM

## 2022-01-08 DIAGNOSIS — N186 End stage renal disease: Secondary | ICD-10-CM

## 2022-01-08 DIAGNOSIS — E782 Mixed hyperlipidemia: Secondary | ICD-10-CM

## 2022-01-08 DIAGNOSIS — R001 Bradycardia, unspecified: Secondary | ICD-10-CM

## 2022-01-08 DIAGNOSIS — I5022 Chronic systolic (congestive) heart failure: Secondary | ICD-10-CM

## 2022-01-08 MED ORDER — DOXAZOSIN MESYLATE 8 MG PO TABS: 8.0000 mg | ORAL_TABLET | Freq: Two times a day (BID) | ORAL | 3 refills | Status: DC

## 2022-01-08 MED ORDER — AMLODIPINE BESYLATE 5 MG PO TABS: ORAL_TABLET | ORAL | 3 refills | Status: DC

## 2022-01-08 NOTE — Patient Instructions (Addendum)
Medication Instructions:  Please take amlodipine 5 mg daily at dinner Doxazosin up to 8 mg twice a day  If blood pressure elevated at 8pm, Ok to take your PM medication early (isosorbide/doxazosin)  If you need a refill on your cardiac medications before your next appointment, please call your pharmacy.   Lab work: No new labs needed  Testing/Procedures: No new testing needed  Follow-Up: At [facility], you and your health needs are our priority.  As part of our continuing mission to provide you with exceptional heart care, we have created designated Provider Care Teams.  These Care Teams include your primary Cardiologist (physician) and Advanced Practice Providers (APPs -  Physician Assistants and Nurse Practitioners) who all work together to provide you with the care you need, when you need it.  You will need a follow up appointment in 3 months  Providers on your designated Care Team:   Murray Hodgkins, NP Christell Faith, PA-C Cadence Kathlen Mody, [place]  COVID-19 Vaccine Information can be found at: [email or web address].uk For questions related to vaccine distribution or appointments, please email [email or web address] or call [phone].

## 2022-02-03 ENCOUNTER — Telehealth: Payer: Self-pay | Admitting: Physician Assistant

## 2022-02-03 NOTE — Telephone Encounter (Signed)
Pt called stating his BP was 92/57 and 96/55 today. He doesn't feel quite right. He took all medications except amlodipine or cardura this morning, including 60 mg imdur. He is concerned about hypotension. BP up to 132/69 as we are on the phone. Recently increased to 60 mg imdur BID. He feels fine now. I discussed that this could be an isolated event. I also advised keeping small bottles of gatorade at home if this happened again. I advised going back to 30 mg imdur BID and track BP and call the office next week. He expressed understanding of the plan.

## 2022-02-12 ENCOUNTER — Other Ambulatory Visit: Payer: Self-pay | Admitting: Cardiovascular Disease

## 2022-02-23 ENCOUNTER — Telehealth: Payer: Self-pay | Admitting: Cardiovascular Disease

## 2022-02-23 NOTE — Telephone Encounter (Signed)
Pt would like to know if there is an alternative for Eliquis. Pt states that there has been an increase in cost of medication. Please advise.

## 2022-02-26 NOTE — Telephone Encounter (Signed)
Minna Merritts, MD  Sent: Sat February 24, 2022  3:46 PM  To: [facility] Triage         Message  Unclear what his options may be based off his insurance  He may want to call his insurance and see what his options are  How are pharmacists may also have some ideas  Medication options include Eliquis, Xarelto, both typically expensive  Pradaxa which is [email or web address]  with [email or web address]  Warfarin which is less appealing  Perhaps he could apply for patient assistance  Thx  TGollan

## 2022-02-27 NOTE — Telephone Encounter (Signed)
Left message to call back  

## 2022-03-02 NOTE — Telephone Encounter (Signed)
Left message to call back  

## 2022-03-05 NOTE — Telephone Encounter (Signed)
Pt made aware of MD's recommendations and stated he will contact insurance and pharmacy to compare prices and call back.

## 2022-04-14 NOTE — Progress Notes (Deleted)
Cardiology Office Note  Date:  04/14/2022   ID:  Timothy Riley, DOB [birth year], MRN [id number]  PCP:  Derinda Late, MD   No chief complaint on file.   HPI:  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 at [facility] with bovine valve,  SVT episodes since his AVR, requiring ablation 06/30/2014 at [facility],  diabetes type 2  BPH,  ejection fraction 30% in April 2023 in the setting of atrial fibrillation down from 50% Bradycardia on beta-blockers Chronic renal insufficiency who presents for routine follow-up of his bioprosthetic valve, hypertension , atrial fibrillation  Last seen by myself in clinic December 2023   On his own he had increased Cardura up to 8 twice daily,sosorbide 60 mg in the evening Blood pressure still running high We recommended he increase isosorbide up to 60 twice daily  In follow-up today he reports afternoon and evening blood pressures continue to run high often getting 0000000 systolic or higher, last night was [id number] systolic He is checking blood pressure at 8 PM, takes his evening medications at 9 PM Has been taking amlodipine as needed, also sometimes takes doxazosin 8 mg in the p.m. instead of 4 mg  Otherwise reports he feels well apart from anxiety concerning his high blood pressure  Followed by nephrology for his chronic renal insufficiency  EKG personally reviewed by myself on todays visit Normal sinus rhythm rate 54 bpm right bundle branch block nonspecific T wave abnormality  Other past medical history reviewed Hospitalized April 2023 CHF, EF at that time 30 to 35% in the setting of atrial fibrillation Underwent cardioversion EF low [id number] Admission complicated by acute renal failure requiring hemodialysis Completed HD for 6 weeks Catheter was removed as renal function improved  Myoview July 11, 2021 no significant ischemia,  moderate fixed defect noted apical inferior, apical lateral and apical region  Hospitalized June 15 encephalopathy, possible syncope, heart rate 32 glucose 63 in the field Carvedilol held  hospital July 2023, syncope  heart rate of 32 and glucose of 63.  Brought into the emergency room and found to have acute kidney injury with creatinine of 2.56  Glipizide and Actos discontinued  Orthostatics positive 08/30/21 in the office Lasix down to 40 daily  Zio monitor August 2023 Normal sinus rhythm Patient had a min HR of 44 bpm, max HR of 179 bpm, and avg HR of 64 bpm.  Bundle Branch Block/IVCD was present.    1 run of Ventricular Tachycardia occurred lasting 4 beats with a max rate of 179 bpm (avg 148 bpm).  Not triggered   1 run of Supraventricular Tachycardia occurred lasting 11 beats with a max rate of 107 bpm (avg 95 bpm).  Isolated SVEs were rare (<1.0%), SVE Couplets were rare (<1.0%), and SVE Triplets were rare (<1.0%).  Isolated VEs were rare (<1.0%), and no VE Couplets or VE Triplets were present.   Patient triggered event associated with normal sinus rhythm  Echo 03/2018,   1. The left ventricle has normal systolic function with an ejection fraction of 60-65%. The cavity size was normal. There is mildly increased left ventricular wall thickness. Left ventricular diastolic Doppler parameters are consistent with impaired  relaxation.  2. The right ventricle has normal systolic function. The cavity was normal. There is no increase in right ventricular wall thickness.  3. Left atrial size was mildly dilated.  4. A bovine  bioprosthesis valve is present in the aortic position. Procedure Date: 03/01/14 Normal aortic valve prosthesis. AV Mean Grad: 16.0 mmHg  5. 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  PSH:    Past Surgical History:  Procedure Laterality Date   AORTIC VALVE REPLACEMENT     CARDIAC CATHETERIZATION     CARDIOVERSION N/A 01/01/2018   Procedure: CARDIOVERSION;  Surgeon: Minna Merritts, MD;  Location: [facility] OR[facility];  Service: Cardiovascular;  Laterality: N/A;   CARDIOVERSION N/A 05/16/2021   Procedure: CARDIOVERSION;  Surgeon: Nelva Bush, MD;  Location: [facility] OR[facility];  Service: Cardiovascular;  Laterality: N/A;   CARDIOVERSION N/A 05/17/2021   Procedure: CARDIOVERSION;  Surgeon: Kate Sable, MD;  Location: [facility] OR[facility];  Service: Cardiovascular;  Laterality: N/A;   COLONOSCOPY  2010   DIALYSIS/PERMA CATHETER INSERTION N/A 05/26/2021   Procedure: DIALYSIS/PERMA CATHETER INSERTION;  Surgeon: Katha Cabal, MD;  Location: [place] [facility];  Service: Cardiovascular;  Laterality: N/A;   DIALYSIS/PERMA CATHETER REMOVAL N/A 08/04/2021   Procedure: DIALYSIS/PERMA CATHETER REMOVAL;  Surgeon: Katha Cabal, MD;  Location: [place] [facility];  Service: Cardiovascular;  Laterality: N/A;   heart valve replacment  2015   TEMPORARY DIALYSIS CATHETER  N/A 05/23/2021   Procedure: TEMPORARY DIALYSIS CATHETER;  Surgeon: Katha Cabal, MD;  Location: [place] [facility];  Service: Cardiovascular;  Laterality: N/A;   XI ROBOTIC ASSISTED INGUINAL HERNIA REPAIR WITH MESH Right 01/07/2019   Procedure: XI ROBOTIC ASSISTED INGUINAL HERNIA REPAIR WITH MESH;  Surgeon: Ronny Bacon, MD;  Location: [facility];  Service: General;  Laterality: Right;    Current Outpatient Medications  Medication Sig Dispense Refill   amiodarone (PACERONE) 200 MG tablet TAKE 1 TABLET BY MOUTH ONCE DAILY 90 tablet 0   amLODipine (NORVASC) 5 MG tablet Take 1 tablet (5 mg) by mouth once daily at 5 pm 90 tablet 3   doxazosin (CARDURA) 8 MG tablet Take 1 tablet (8 mg total) by mouth 2 (two) times daily. 180 tablet 3   ELIQUIS 2.5 MG TABS tablet TAKE 1 TABLET BY MOUTH TWICE A DAY 180 tablet 1   furosemide (LASIX) 40 MG tablet Take 1 tablet (40 mg total) by mouth daily. 90 tablet 0   glipiZIDE (GLUCOTROL XL) 5 MG 24 hr tablet Take 5 mg by mouth daily with breakfast.     isosorbide mononitrate (IMDUR) 60 MG 24 hr tablet Take 1 tablet (60 mg) by mouth twice daily 180 tablet 1   levothyroxine (SYNTHROID) 25 MCG tablet Take 25 mcg by mouth daily before breakfast.     rosuvastatin (CRESTOR) 10 MG tablet Take 1 tablet (10 mg total) by mouth  daily. 90 tablet 0   No current facility-administered medications for this visit.    Allergies:   Gabapentin, Hydralazine, and Hydralazine hcl   Social History:  The patient  reports that he quit smoking about 50 years ago. His smoking use included cigarettes. He has a 0.50 pack-year smoking history. He has never used smokeless tobacco. He reports that he does not drink alcohol and does not use drugs.   Family History:   family history includes Cancer in his mother; Stroke in his father.   Review of Systems: Review of Systems  Constitutional: Negative.   HENT: Negative.    Respiratory: Negative.    Cardiovascular: Negative.    Gastrointestinal: Negative.   Musculoskeletal: Negative.   Neurological: Negative.   Psychiatric/Behavioral: Negative.    All other systems reviewed and are negative.   PHYSICAL EXAM: VS:  There were no vitals taken for this visit. , BMI There is no height or weight on file to calculate BMI. Constitutional:  oriented to person, place, and time. No distress.  HENT:  Head: Grossly normal Eyes:  no discharge. No scleral icterus.  Neck: No JVD, no carotid bruits  Cardiovascular: Regular rate and rhythm, no murmurs appreciated Pulmonary/Chest: Clear to auscultation bilaterally, no wheezes or rails Abdominal: Soft.  no distension.  no tenderness.  Musculoskeletal: Normal range of motion Neurological:  normal muscle tone. Coordination normal. No atrophy Skin: Skin warm and dry Psychiatric: normal affect, pleasant  Recent Labs: 05/13/2021: B Natriuretic Peptide 1,597.6 05/28/2021: Magnesium 2.0 08/12/2021: ALT 20; TSH 5.608 08/13/2021: BUN 41; Creatinine, Ser 2.25; Hemoglobin 10.6; Platelets 160; Potassium 3.4; Sodium 142    Lipid Panel Lab Results  Component Value Date   CHOL 135 12/23/2018   HDL 28 (L) 12/23/2018   LDLCALC 79 12/23/2018   TRIG 185 (H) 12/23/2018    Wt Readings from Last 3 Encounters:  01/08/22 174 lb 6 oz (79.1 kg)  11/06/21 174 lb 6 oz (79.1 kg)  10/20/21 173 lb 9.6 oz (78.7 kg)     ASSESSMENT AND PLAN:  Atrial fibrillation, persistent Prior cardioversion,  Maintaining normal sinus rhythm Not on beta-blocker secondary to bradycardia Recommend he continue amiodarone with Eliquis  chronic renal failure, end-stage renal Followed by nephrology,  Previously required hemodialysis 3 days a week Monday Wednesday Friday for 6 weeks, now no longer on hemodialysis, port has been removed On Lasix 40 daily, renal function stable  Leg swelling No significant swelling on today's visit, appears euvolemic  Acute on chronic diastolic CHF/dilated  cardiomyopathy Ejection fraction 50 to 55% in August 2022 drop in ejection fraction 30 to 35%, no change on repeat echo Myoview with no large regions of ischemia Off carvedilol secondary to bradycardia Not a good candidate for ACE, ARB, Entresto, SGLT2 inhibitor in the setting of renal dysfunction and hypoglycemia Again we will focus on blood pressure control which continues to run high  HTN Blood pressure continues to be an issue He has been taking Cardura 8 mg in the morning isosorbide 60 mg in the morning around 1030 to 11 AM when he wakes up Has been taking isosorbide 60 with Cardura 4 up to 8 mg at 9 PM Blood pressure running markedly high at x [id number] systolic We have recommended he add amlodipine 5 mg at 5 PM If blood pressure continues to run high on his evening checks will likely need to increase Cardura up to 8 mg twice daily and suggested he may need to take his evening medications earlier at 8  PM if numbers are high  Diabetes: A1c running higher 8.6, recent medication changes with primary care Unclear of medication compliance and issue  Arteriosclerosis of coronary artery - Plan: EKG 12-Lead Not a good candidate for cardiac catheterization given renal dysfunction Myoview has been completed No high risk ischemia Denies significant chest pain concerning for angina  S/P AVR (aortic valve replacement) - Plan: EKG 12-Lead On echo, stable prosthetic valve  Hyperlipidemia Continue Crestor   Total encounter time more than 30 minutes  Greater than 50% was spent in counseling and coordination of care with the patient    No orders of the defined types were placed in this encounter.    Signed, Esmond Plants, M.D., Ph.D. 04/14/2022  [place], [place]

## 2022-04-16 ENCOUNTER — Ambulatory Visit: Payer: Medicare Other | Admitting: Cardiovascular Disease

## 2022-04-16 DIAGNOSIS — I5022 Chronic systolic (congestive) heart failure: Secondary | ICD-10-CM

## 2022-04-16 DIAGNOSIS — I471 Supraventricular tachycardia, unspecified: Secondary | ICD-10-CM

## 2022-04-16 DIAGNOSIS — I4819 Other persistent atrial fibrillation: Secondary | ICD-10-CM

## 2022-04-16 DIAGNOSIS — I420 Dilated cardiomyopathy: Secondary | ICD-10-CM

## 2022-04-16 DIAGNOSIS — R001 Bradycardia, unspecified: Secondary | ICD-10-CM

## 2022-04-16 DIAGNOSIS — I10 Essential (primary) hypertension: Secondary | ICD-10-CM

## 2022-04-16 DIAGNOSIS — I428 Other cardiomyopathies: Secondary | ICD-10-CM

## 2022-04-16 DIAGNOSIS — I25118 Atherosclerotic heart disease of native coronary artery with other forms of angina pectoris: Secondary | ICD-10-CM

## 2022-04-16 DIAGNOSIS — N186 End stage renal disease: Secondary | ICD-10-CM

## 2022-04-16 DIAGNOSIS — E782 Mixed hyperlipidemia: Secondary | ICD-10-CM

## 2022-04-25 ENCOUNTER — Ambulatory Visit: Payer: Medicare Other | Attending: Cardiovascular Disease | Admitting: Cardiovascular Disease

## 2022-04-25 ENCOUNTER — Encounter: Payer: Self-pay | Admitting: Cardiovascular Disease

## 2022-04-25 VITALS — BP 156/70 | HR 58 | Ht 68.0 in | Wt 178.5 lb

## 2022-04-25 DIAGNOSIS — I10 Essential (primary) hypertension: Secondary | ICD-10-CM

## 2022-04-25 DIAGNOSIS — I4819 Other persistent atrial fibrillation: Secondary | ICD-10-CM | POA: Diagnosis not present

## 2022-04-25 DIAGNOSIS — I25118 Atherosclerotic heart disease of native coronary artery with other forms of angina pectoris: Secondary | ICD-10-CM | POA: Diagnosis not present

## 2022-04-25 DIAGNOSIS — N186 End stage renal disease: Secondary | ICD-10-CM

## 2022-04-25 DIAGNOSIS — I5022 Chronic systolic (congestive) heart failure: Secondary | ICD-10-CM | POA: Diagnosis not present

## 2022-04-25 DIAGNOSIS — I420 Dilated cardiomyopathy: Secondary | ICD-10-CM | POA: Diagnosis not present

## 2022-04-25 DIAGNOSIS — E782 Mixed hyperlipidemia: Secondary | ICD-10-CM

## 2022-04-25 MED ORDER — FUROSEMIDE 40 MG PO TABS: 40.0000 mg | ORAL_TABLET | Freq: Every day | ORAL | 3 refills | Status: DC

## 2022-04-25 MED ORDER — APIXABAN 2.5 MG PO TABS: 2.5000 mg | ORAL_TABLET | Freq: Two times a day (BID) | ORAL | 3 refills | Status: DC

## 2022-04-25 MED ORDER — AMLODIPINE BESYLATE 5 MG PO TABS: ORAL_TABLET | ORAL | 3 refills | Status: DC

## 2022-04-25 MED ORDER — AMIODARONE HCL 200 MG PO TABS: 200.0000 mg | ORAL_TABLET | Freq: Every day | ORAL | 3 refills | Status: DC

## 2022-04-25 NOTE — Patient Instructions (Addendum)
Medication Instructions:  Amlodipine 5 mg daily, 5 pm take extra 5 mg as needed for pressure >150  If you need a refill on your cardiac medications before your next appointment, please call your pharmacy.   Lab work: No new labs needed  Testing/Procedures: Echo for cardiomyopathy and AVR  Your physician has requested that you have an echocardiogram. Echocardiography is a painless test that uses sound waves to create images of your heart. It provides your doctor with information about the size and shape of your heart and how well your heart's chambers and valves are working. This procedure takes approximately one hour. There are no restrictions for this procedure. Please do NOT wear cologne, perfume, aftershave, or lotions (deodorant is allowed). Please arrive 15 minutes prior to your appointment time.   Follow-Up: At [facility], you and your health needs are our priority.  As part of our continuing mission to provide you with exceptional heart care, we have created designated Provider Care Teams.  These Care Teams include your primary Cardiologist (physician) and Advanced Practice Providers (APPs -  Physician Assistants and Nurse Practitioners) who all work together to provide you with the care you need, when you need it.  You will need a follow up appointment in 6 months  Providers on your designated Care Team:   Murray Hodgkins, NP Christell Faith, PA-C Cadence Kathlen Mody, [place]  COVID-19 Vaccine Information can be found at: [email or web address] For questions related to vaccine distribution or appointments, please email [email or web address] or call [phone].

## 2022-04-25 NOTE — Progress Notes (Signed)
Cardiology Office Note  Date:  04/25/2022   ID:  Timothy Riley, DOB 03/03/1940, MRN [id number]  PCP:  Derinda Late, MD   Chief Complaint  Patient presents with   3 month follow up     Patient c/o fluctuating blood pressure. Medications reviewed by the patient verbally.     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 at [facility] with bovine valve,  SVT episodes since his AVR, requiring ablation 06/30/2014 at [facility],  diabetes type 2  BPH,  ejection fraction 30% in April 2023 in the setting of atrial fibrillation down from 50% Bradycardia on beta-blockers Chronic renal insufficiency Hypertensive cardiomyopathy, EF 30% April 2023 who presents for routine follow-up of his bioprosthetic valve, hypertension , atrial fibrillation  Last seen by myself in clinic December 2023 Recent increase of isosorbide up to 60 mg twice daily, developed hypotension February 03, 2022, was instructed to decrease back to 30 mg twice daily  In follow-up today, checking BP several times a day He has changed his regimen as detailed below Likes to feel with his medications and change the dosing depending on his blood pressure measurements Current medication regiment A.m. Cardura 4 mg Isosorbide 30  PM (10 pm) Cardura 4 mg Isosorbide 30 Amlodipine 5  at 10 pm  As previously discussed, not a good candidate for ACE, ARB, Entresto, SGLT2 inhibitor in the setting of renal dysfunction and hypoglycemia   BP runs higher in the PM, feels it goes up 20-30 points in the evening On his last clinic visit we talked about taking amlodipine earlier in the evening, he continues to take everything at 10 PM Weight up 4 pounds, no edema or ABD swelling Goes to the gym a little bit, [place], denies chest pain concerning for angina  Lab work reviewed Creatinine 2.7 BUN 42 A1c  8.6 Total cholesterol 137 LDL 71  Last echocardiogram April 2023, EF 30%   anxiety concerning his high blood pressure  Followed by nephrology for his chronic renal insufficiency  EKG personally reviewed by myself on todays visit Normal sinus rhythm rate 58 bpm right bundle branch block nonspecific T wave abnormality  Other past medical history reviewed Hospitalized April 2023 CHF, EF at that time 30 to 35% in the setting of atrial fibrillation Underwent cardioversion EF low [id number] Admission complicated by acute renal failure requiring hemodialysis Completed HD for 6 weeks Catheter was removed as renal function improved  Myoview July 11, 2021 no significant ischemia, moderate fixed defect noted apical inferior, apical lateral and apical region  Hospitalized June 15 encephalopathy, possible syncope, heart rate 32 glucose 63 in the field Carvedilol held  hospital July 2023, syncope  heart rate of 32 and glucose of 63.  Brought into the emergency room and found to have acute kidney injury with creatinine of 2.56  Glipizide and Actos discontinued  Orthostatics positive 08/30/21 in the office Lasix down to 40 daily  Zio monitor August 2023 Normal sinus rhythm Patient had a min HR of 44 bpm, max HR of 179 bpm, and avg HR of 64 bpm.  Bundle Branch Block/IVCD was present.    1 run of Ventricular Tachycardia occurred lasting 4 beats with a max rate of 179 bpm (avg 148 bpm).  Not triggered   1 run of Supraventricular Tachycardia occurred lasting 11 beats with a max rate of 107 bpm (  avg 95 bpm).  Isolated SVEs were rare (<1.0%), SVE Couplets were rare (<1.0%), and SVE Triplets were rare (<1.0%).  Isolated VEs were rare (<1.0%), and no VE Couplets or VE Triplets were present.   Patient triggered event associated with normal sinus rhythm  Echo 03/2018,   1. The left ventricle has normal systolic function with an ejection fraction of 60-65%. The cavity size was normal. There is mildly  increased left ventricular wall thickness. Left ventricular diastolic Doppler parameters are consistent with impaired  relaxation.  2. The right ventricle has normal systolic function. The cavity was normal. There is no increase in right ventricular wall thickness.  3. Left atrial size was mildly dilated.  4. 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 Procedure Date: 03/01/14 Normal aortic valve prosthesis. AV Mean Grad: 16.0 mmHg  5. 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  PSH:    Past Surgical History:  Procedure Laterality Date   AORTIC VALVE REPLACEMENT     CARDIAC CATHETERIZATION     CARDIOVERSION N/A 01/01/2018   Procedure: CARDIOVERSION;  Surgeon: Minna Merritts, MD;  Location: [facility];  Service: Cardiovascular;  Laterality: N/A;   CARDIOVERSION N/A 05/16/2021   Procedure: CARDIOVERSION;  Surgeon: Nelva Bush, MD;  Location: [facility];  Service:  Cardiovascular;  Laterality: N/A;   CARDIOVERSION N/A 05/17/2021   Procedure: CARDIOVERSION;  Surgeon: Kate Sable, MD;  Location: [facility];  Service: Cardiovascular;  Laterality: N/A;   COLONOSCOPY  2010   DIALYSIS/PERMA CATHETER INSERTION N/A 05/26/2021   Procedure: DIALYSIS/PERMA CATHETER INSERTION;  Surgeon: Katha Cabal, MD;  Location: [place] [facility];  Service: Cardiovascular;  Laterality: N/A;   DIALYSIS/PERMA CATHETER REMOVAL N/A 08/04/2021   Procedure: DIALYSIS/PERMA CATHETER REMOVAL;  Surgeon: Katha Cabal, MD;  Location: [facility];  Service: Cardiovascular;  Laterality: N/A;   heart valve replacment  2015   TEMPORARY DIALYSIS CATHETER N/A 05/23/2021   Procedure: TEMPORARY DIALYSIS CATHETER;  Surgeon: Katha Cabal, MD;  Location: [place] [facility];  Service: Cardiovascular;  Laterality: N/A;   XI ROBOTIC ASSISTED INGUINAL HERNIA REPAIR WITH MESH Right 01/07/2019   Procedure: XI ROBOTIC ASSISTED INGUINAL HERNIA REPAIR WITH MESH;  Surgeon: Ronny Bacon, MD;  Location: [facility];  Service: General;  Laterality: Right;    Current Outpatient Medications  Medication Sig Dispense Refill   amiodarone (PACERONE) 200 MG tablet TAKE 1 TABLET BY MOUTH ONCE DAILY 90 tablet 0   amLODipine (NORVASC) 5 MG tablet Take 1 tablet (5 mg) by mouth once daily at 5 pm 90 tablet 3   doxazosin (CARDURA) 8 MG tablet Take 4 mg by  mouth 2 (two) times daily.     ELIQUIS 2.5 MG TABS tablet TAKE 1 TABLET BY MOUTH TWICE A DAY 180 tablet 1   furosemide (LASIX) 40 MG tablet Take 1 tablet (40 mg total) by mouth daily. 90 tablet 0   glipiZIDE (GLUCOTROL XL) 5 MG 24 hr tablet Take 5 mg by mouth daily with breakfast.     isosorbide mononitrate (IMDUR) 60 MG 24 hr tablet Take 30 mg by mouth 2 (two) times daily.     levothyroxine (SYNTHROID) 25 MCG tablet Take 25 mcg by mouth daily before breakfast.     rosuvastatin (CRESTOR) 10 MG tablet Take 1 tablet (10 mg total) by mouth daily.  90 tablet 0   No current facility-administered medications for this visit.    Allergies:   Gabapentin, Hydralazine, and Hydralazine hcl   Social History:  The patient  reports that he quit smoking about 50 years ago. His smoking use included cigarettes. He has a 0.50 pack-year smoking history. He has never used smokeless tobacco. He reports that he does not drink alcohol and does not use drugs.   Family History:   family history includes Cancer in his mother; Stroke in his father.   Review of Systems: Review of Systems  Constitutional: Negative.   HENT: Negative.    Respiratory: Negative.    Cardiovascular: Negative.   Gastrointestinal: Negative.   Musculoskeletal: Negative.   Neurological: Negative.   Psychiatric/Behavioral: Negative.    All other systems reviewed and are negative.   PHYSICAL EXAM: VS:  BP (!) 156/70 (BP Location: Left Arm, Patient Position: Sitting, Cuff Size: Normal)   Pulse (!) 58   Ht 5\' 8"  (1.727 m)   Wt 178 lb 8 oz (81 kg)   SpO2 98%   BMI 27.14 kg/m  , BMI Body mass index is 27.14 kg/m. Constitutional:  oriented to person, place, and time. No distress.  HENT:  Head: Grossly normal Eyes:  no discharge. No scleral icterus.  Neck: No JVD, no carotid bruits  Cardiovascular: Regular rate and rhythm, no murmurs appreciated Pulmonary/Chest: Clear to auscultation bilaterally, no wheezes or rails Abdominal: Soft.  no distension.  no tenderness.  Musculoskeletal: Normal range of motion Neurological:  normal muscle tone. Coordination normal. No atrophy Skin: Skin warm and dry Psychiatric: normal affect, pleasant   Recent Labs: 05/13/2021: B Natriuretic Peptide 1,597.6 05/28/2021: Magnesium 2.0 08/12/2021: ALT 20; TSH 5.608 08/13/2021: BUN 41; Creatinine, Ser 2.25; Hemoglobin 10.6; Platelets 160; Potassium 3.4; Sodium 142    Lipid Panel Lab Results  Component Value Date   CHOL 135 12/23/2018   HDL 28 (L) 12/23/2018   LDLCALC 79 12/23/2018   TRIG  185 (H) 12/23/2018    Wt Readings from Last 3 Encounters:  04/25/22 178 lb 8 oz (81 kg)  01/08/22 174 lb 6 oz (79.1 kg)  11/06/21 174 lb 6 oz (79.1 kg)     ASSESSMENT AND PLAN:  Atrial fibrillation, persistent Prior cardioversion,  Maintaining normal sinus rhythm Not on beta-blocker secondary to bradycardia Continue amiodarone with Eliquis renal dose  chronic renal failure, end-stage renal Followed by nephrology, creatinine 2.7, stable Previously required hemodialysis 3 days a week Monday Wednesday Friday for 6 weeks, now no longer on hemodialysis, port has been removed On Lasix 40 daily, denies leg swelling, weight is up 4 pounds, likely from fluid weight  Leg swelling No significant swelling on today's visit, appears euvolemic  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 Ejection fraction 50 to 55% in August 2022  drop in ejection fraction 30 to 35%, no change on repeat echo Myoview with no large regions of ischemia Possibly hypertensive cardiomyopathy Continues to struggle with blood pressure Has not taken his blood pressure medications yet this morning, likes to do them 10 AM to 10 PM Off carvedilol secondary to bradycardia Not a good candidate for ACE, ARB, Entresto, SGLT2 inhibitor in the setting of renal dysfunction and hypoglycemia  echo ordered for update  Essential hypertension Continue Cardura 4 twice daily, isosorbide 30 twice daily Norvasc 5 at 5 PM, extra 5 mg dose for pressure over 150  Diabetes: A1c running higher 8.6, stressed importance of calorie restriction  Arteriosclerosis of coronary artery - Plan: EKG 12-Lead Not a good candidate for cardiac catheterization given renal dysfunction Myoview has been completed, No high risk ischemia  S/P AVR (aortic valve replacement) - Plan: EKG 12-Lead On echo, stable prosthetic valve  Hyperlipidemia Continue Crestor   Total encounter time more than 30 minutes  Greater than 50% was spent in  counseling and coordination of care with the patient    No orders of the defined types were placed in this encounter.    Signed, Esmond Plants, M.D., Ph.D. 04/25/2022  [place], [place]

## 2022-05-30 ENCOUNTER — Ambulatory Visit: Payer: Medicare Other | Attending: Cardiovascular Disease

## 2022-05-30 ENCOUNTER — Telehealth: Payer: Self-pay | Admitting: Cardiovascular Disease

## 2022-05-30 DIAGNOSIS — I5022 Chronic systolic (congestive) heart failure: Secondary | ICD-10-CM

## 2022-05-30 DIAGNOSIS — I420 Dilated cardiomyopathy: Secondary | ICD-10-CM

## 2022-05-30 DIAGNOSIS — I4819 Other persistent atrial fibrillation: Secondary | ICD-10-CM

## 2022-05-30 LAB — ECHOCARDIOGRAM COMPLETE
AR max vel: 1.56 cm2
AV Area VTI: 1.72 cm2
AV Area mean vel: 1.6 cm2
AV Mean grad: 9 mmHg
AV Peak grad: 17.9 mmHg
Ao pk vel: 2.12 m/s
Area-P 1/2: 2.45 cm2
Calc EF: 60.8 %
S' Lateral: 3 cm
Single Plane A2C EF: 64 %
Single Plane A4C EF: 56.8 %

## 2022-05-30 NOTE — Telephone Encounter (Signed)
Called patient but no answer. Left a message for call back.

## 2022-05-30 NOTE — Telephone Encounter (Signed)
Patient came in asking if he can have a prescription for Gabapentin. Was taking over the counter 6 months ago but now not available. Please advise he says helps with tingling

## 2022-05-31 NOTE — Telephone Encounter (Signed)
Attempted to call the patient. No answer- I left a detailed message (ok per DPR) advising that a RX for Gabapentin will need to come from his PCP as Dr. Mariah Milling does not prescribe this.   I have asked that he reach out to his PCP for further assistance, but may call us back at the office with any other questions.

## 2022-06-06 ENCOUNTER — Ambulatory Visit: Payer: BC Managed Care – PPO | Admitting: Dermatology

## 2022-06-13 ENCOUNTER — Telehealth: Payer: Self-pay | Admitting: Cardiovascular Disease

## 2022-06-13 NOTE — Telephone Encounter (Signed)
Patient made aware of results and verbalized understanding.   Per Dr. Mariah Milling:  Timothy Iba, MD 06/10/2022  4:53 PM EDT     Echo Low normal ejection fraction 50 to 55%, right ventricle normal size and function Left atrium severely dilated Bioprosthetic valve working well Overall no acute changes and ejection fraction has improved from 30 now up to 50 in the past yea

## 2022-06-13 NOTE — Telephone Encounter (Signed)
Patient returning call for echo results. 

## 2022-06-18 ENCOUNTER — Other Ambulatory Visit: Payer: Self-pay | Admitting: Cardiovascular Disease

## 2022-06-19 NOTE — Telephone Encounter (Signed)
Refill Request.  

## 2022-06-19 NOTE — Telephone Encounter (Signed)
Prescription refill request for Eliquis received. Indication: AF Last office visit: 04/25/22  Concha Se MD Scr: 2.43 on 06/13/22  Epic Age: 82 Weight: 81kg  Based on above findings Eliquis 2.5mg  twice daily is the appropriate dose.  Refill approved.

## 2022-07-04 ENCOUNTER — Other Ambulatory Visit: Payer: Self-pay | Admitting: Cardiovascular Disease

## 2022-07-10 ENCOUNTER — Telehealth: Payer: Self-pay | Admitting: Cardiovascular Disease

## 2022-07-10 ENCOUNTER — Other Ambulatory Visit: Payer: Self-pay | Admitting: Cardiovascular Disease

## 2022-07-10 NOTE — Telephone Encounter (Signed)
Hi,  Please advise if medication should be refill or deferred to PCP? The pharmacy called the call center and this note was left:  Pharmacy called in stating they sent a request for Amoxicillin refill. This is not longer on pt's med list and pt is due to have dental appointment. Please advise if this can be added back.      Thank you so much, Kavaughn Faucett.

## 2022-07-10 NOTE — Telephone Encounter (Signed)
Pharmacy called in stating they sent a request for Amoxicillin refill. This is not longer on pt's med list and pt is due to have dental appointment. Please advise if this can be added back.

## 2022-07-11 ENCOUNTER — Other Ambulatory Visit: Payer: Self-pay | Admitting: Cardiovascular Disease

## 2022-07-11 MED ORDER — AMOXICILLIN 500 MG PO CAPS: 2000.0000 mg | ORAL_CAPSULE | Freq: Once | ORAL | 2 refills | Status: AC

## 2022-07-11 NOTE — Telephone Encounter (Signed)
Called patient and left a message that the Amoxicillin has been sent in to pharmacy.

## 2022-09-25 ENCOUNTER — Encounter: Payer: Self-pay | Admitting: Medical

## 2022-09-25 ENCOUNTER — Ambulatory Visit: Payer: Medicare Other | Attending: Cardiovascular Disease | Admitting: Medical

## 2022-09-25 VITALS — BP 150/74 | HR 59 | Ht 68.0 in | Wt 175.2 lb

## 2022-09-25 DIAGNOSIS — N186 End stage renal disease: Secondary | ICD-10-CM

## 2022-09-25 DIAGNOSIS — I10 Essential (primary) hypertension: Secondary | ICD-10-CM

## 2022-09-25 DIAGNOSIS — I4819 Other persistent atrial fibrillation: Secondary | ICD-10-CM

## 2022-09-25 DIAGNOSIS — Z952 Presence of prosthetic heart valve: Secondary | ICD-10-CM

## 2022-09-25 DIAGNOSIS — E782 Mixed hyperlipidemia: Secondary | ICD-10-CM

## 2022-09-25 DIAGNOSIS — I5032 Chronic diastolic (congestive) heart failure: Secondary | ICD-10-CM

## 2022-09-25 DIAGNOSIS — I951 Orthostatic hypotension: Secondary | ICD-10-CM

## 2022-09-25 NOTE — Patient Instructions (Addendum)
Medication Instructions:  Your physician recommends that you continue on your current medications as directed. Please refer to the Current Medication list given to you today.   *If you need a refill on your cardiac medications before your next appointment, please call your pharmacy*   Lab Work: No labs ordered today    Testing/Procedures: No test ordered today    Follow-Up: At [facility], you and your health needs are our priority.  As part of our continuing mission to provide you with exceptional heart care, we have created designated Provider Care Teams.  These Care Teams include your primary Cardiologist (physician) and Advanced Practice Providers (APPs -  Physician Assistants and Nurse Practitioners) who all work together to provide you with the care you need, when you need it.  We recommend signing up for the patient portal called "MyChart".  Sign up information is provided on this After Visit Summary.  MyChart is used to connect with patients for Virtual Visits (Telemedicine).  Patients are able to view lab/test results, encounter notes, upcoming appointments, etc.  Non-urgent messages can be sent to your provider as well.   To learn more about what you can do with MyChart, go to [email or web address].    Your next appointment:   4 month(s)  Provider:    Julien Nordmann, MD

## 2022-09-25 NOTE — Progress Notes (Unsigned)
Cardiology Office Note:    Date:  09/25/2022   ID:  MOSHEH EYMARD, DOB [birth year], MRN [id number]  PCP:  Kandyce Rud, MD  [facility] Cardiologist:  Julien Nordmann, MD  [facility] Electrophysiologist:  None   Referring MD: Kandyce Rud, MD   Chief Complaint: 6 month follow-up  History of Present Illness:    Timothy Riley is a 82 y.o. male with a hx of  persistent Afib, former smoker, severe AS s/p AVR 03/2014 at [facility] with bovine valve, SVT since AVR requirign ablation 06/2014 at [facility], DM2, BPH, HTN, HFrEF, ESRD previously on HD.    He was previously followed by [facility] cardiology.  Echo in 2018 demonstrated an EF of 55 to 65%, mild to moderate LVH, hypokinesis of the anteroseptal myocardium, grade 1 diastolic dysfunction, bioprosthetic aortic valve, mildly dilated left atrium, normal RV systolic function and PASP.  Outpatient cardiac monitoring in 2019 showed a 100% A-fib burden with 1 run of NSVT lasting 14.9 seconds.  Nuclear stress test in 03/2018 was low risk and showed no evidence of ischemia or scar.  Echo in 2022 demonstrated an EF of 50 to 55%, grade 2 diastolic dysfunction, normal RV systolic function and ventricular cavity size, moderately elevated PASP, moderately dilated left atrium, mildly dilated right atrium, mild mitral vegetation, bioprosthetic aortic valve with mild to moderate regurgitation.   He was admitted to the hospital in 04/2021 with volume overload.  Echo showed a newly reduced EF of 30 to 35%, global hypokinesis, moderately dilated LV internal cavity size, moderately reduced RV systolic function with moderately large RV cavity size, PASP 45 mmHg, moderately dilated left atrium, moderate mitral regurgitation, moderate tricuspid regurgitation, bioprosthetic aortic valve without evidence of regurgitation or stenosis, and an estimated right atrial pressure of 15 mmHg.  He underwent successful DCCV.  He underwent repeat limited echo during the admission which  showed a persistent cardiomyopathy with an EF of 30% and improved PA pressure of 38.4 mmHg.  Admission was complicated by acute renal failure requiring hemodialysis.   He was seen in hospital follow-up by his primary cardiologist in 05/2021 and was maintaining sinus rhythm.  Subsequent Lexiscan MPI on 07/11/2021 showed a moderate in size, moderate in severity, fixed apical inferior, apical lateral, and apical defect most consistent with scar versus artifact.  There was no evidence of significant ischemia.  LVEF 30 to 35% with global hypokinesis.  Coronary artery calcification, aortic atherosclerosis, and aortic valve prosthesis were noted on CT attenuated corrected images.  A small right pleural effusion was present.  Continued medical therapy was recommended, particularly in light of the patient's underlying renal dysfunction.   He was admitted to the hospital from 7/15 through 7/16 with decreased state of consciousness with concern for possible syncope after noted to be veering on the road.  He was found to have a heart rate of 32 bpm and a glucose of 63 in the field.  Initial EKG showed sinus bradycardia with a rate of 47 bpm.  High-sensitivity troponin 18 with a delta of 17.  It was recommended carvedilol be held. Follow-up heart monitor was ordered, this showed NSR with avergae HR of 64bpm, BBB/IVCD, 1 run VT lasting 4 beats, 1 run SVT lasting 11 beats.    Seen in the office August 2023 and orthostatics were positive and lasix was decreased to 40mg  daily.   The patient was last seen 03/2022 and was taking cardura 4mg  and Imdur 30mg  in the am and cardura 4mg , Imdur 30mg   and amlodipine 5mg  in the pm.  Echo 05/2022 showed LVEF 50-55%, no WMA, mild LVH, G1DD, normal RVSF, mild MR, severely dilated left atria, normally functioning aortic valve, mean gradient , borderline dilation of the ascending aorta measuring 38mm.  Today, BP is still a little high, however he has not taken am meds. He stopped Imdur  and amlodipine due to low BP with systolics in the 90s, and he was feeling disoriented. He denies chest pain, shortness of breath, lower leg edema. 2 days ago he was standing up and started feeling weak. He sat down and started sweating, 2 minutes later symptoms resolved.   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 Glower MD @ [facility] via R thoracic approach; b. 03/2018 Echo: EF 60-65%, DD. Mildly dil LA. Nl fxn AoV prosthesis. Grad .   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 12/2013 Cath ([facility]): LAD 30p, RI 60, otw nl (per CT surgery note from [facility]); b. 03/2018 MV: EF 59%, no ischemia/scar.   Persistent atrial fibrillation (HCC) 10/2017   a. CHA2DS2VASc = 5-->Eliquis; b. 12/2017 loaded w/amio-->DCCV.   PSVT (paroxysmal supraventricular tachycardia)    a. 05/2014 s/p RFCA @ [facility].   Right inguinal hernia    Urinary incontinence     Past Surgical History:  Procedure Laterality Date   AORTIC VALVE REPLACEMENT     CARDIAC CATHETERIZATION     CARDIOVERSION N/A 01/01/2018   Procedure: CARDIOVERSION;  Surgeon: Antonieta Iba, MD;  Location: [facility] ORS;  Service: Cardiovascular;  Laterality: N/A;   CARDIOVERSION N/A 05/16/2021   Procedure: CARDIOVERSION;  Surgeon: Yvonne Kendall, MD;  Location: [facility] ORS;  Service: Cardiovascular;  Laterality: N/A;   CARDIOVERSION N/A 05/17/2021   Procedure: CARDIOVERSION;  Surgeon: Debbe Odea, MD;   Location: [facility] ORS;  Service: Cardiovascular;  Laterality: N/A;   COLONOSCOPY  2010   DIALYSIS/PERMA CATHETER INSERTION N/A 05/26/2021   Procedure: DIALYSIS/PERMA CATHETER INSERTION;  Surgeon: Renford Dills, MD;  Location: [facility] INVASIVE CV LAB;  Service: Cardiovascular;  Laterality: N/A;   DIALYSIS/PERMA CATHETER REMOVAL N/A 08/04/2021   Procedure: DIALYSIS/PERMA CATHETER REMOVAL;  Surgeon: Renford Dills, MD;  Location: [facility] INVASIVE CV LAB;  Service: Cardiovascular;  Laterality: N/A;   heart valve replacment  2015   TEMPORARY DIALYSIS CATHETER N/A 05/23/2021   Procedure: TEMPORARY DIALYSIS CATHETER;  Surgeon: Renford Dills, MD;  Location: [facility] INVASIVE CV LAB;  Service: Cardiovascular;  Laterality: N/A;   XI ROBOTIC ASSISTED INGUINAL HERNIA REPAIR WITH MESH Right 01/07/2019   Procedure: XI ROBOTIC ASSISTED INGUINAL HERNIA REPAIR WITH MESH;  Surgeon: Campbell Lerner, MD;  Location: [facility] ORS;  Service: General;  Laterality: Right;    Current Medications: Current Meds  Medication Sig   amiodarone (PACERONE) 200 MG tablet Take 1 tablet (200 mg total) by mouth daily.   doxazosin (CARDURA) 8 MG tablet Take 4 mg by mouth 2 (two) times daily.  ELIQUIS 2.5 MG TABS tablet TAKE ONE TABLET BY MOUTH TWICE A DAY   furosemide (LASIX) 40 MG tablet Take 1 tablet (40 mg total) by mouth daily.   glipiZIDE (GLUCOTROL XL) 5 MG 24 hr tablet Take 5 mg by mouth daily with breakfast.   levothyroxine (SYNTHROID) 25 MCG tablet Take 25 mcg by mouth daily before breakfast.   rosuvastatin (CRESTOR) 10 MG tablet Take 1 tablet (10 mg total) by mouth daily.   [DISCONTINUED] amLODipine (NORVASC) 5 MG tablet Take 1 tablet (5 mg) by mouth once daily at 5 pm     Allergies:   Gabapentin, Hydralazine, and Hydralazine hcl   Social History   Socioeconomic History   Marital status: Married    Spouse name: betty   Number of children: 2   Years of education: 12   Highest education level: High school graduate   Occupational History   Occupation: retired    Comment: truck Hospital doctor  Tobacco Use   Smoking status: Former    Current packs/day: 0.00    Average packs/day: 0.5 packs/day for 1 year (0.5 ttl pk-yrs)    Types: Cigarettes    Start date: 01/30/1971    Quit date: 01/30/1972    Years since quitting: 50.6   Smokeless tobacco: Never   Tobacco comments:    over 45 yrs ago  Vaping Use   Vaping status: Never Used  Substance and Sexual Activity   Alcohol use: No    Alcohol/week: 0.0 standard drinks of alcohol   Drug use: No   Sexual activity: Not Currently  Other Topics Concern   Not on file  Social History Narrative   Not on file   Social Determinants of Health   Financial Resource Strain: Low Risk  (11/05/2017)   Overall Financial Resource Strain (CARDIA)    Difficulty of Paying Living Expenses: Not hard at all  Food Insecurity: No Food Insecurity (11/05/2017)   Hunger Vital Sign    Worried About Running Out of Food in the Last Year: Never true    Ran Out of Food in the Last Year: Never true  Transportation Needs: No Transportation Needs (11/05/2017)   PRAPARE - Administrator, Civil Service (Medical): No    Lack of Transportation (Non-Medical): No  Physical Activity: Inactive (11/05/2017)   Exercise Vital Sign    Days of Exercise per Week: 0 days    Minutes of Exercise per Session: 0 min  Stress: No Stress Concern Present (11/05/2017)   [employer] of Occupational Health - Occupational Stress Questionnaire    Feeling of Stress : Not at all  Social Connections: Moderately Isolated (11/05/2017)   Social Connection and Isolation Panel [NHANES]    Frequency of Communication with Friends and Family: Once a week    Frequency of Social Gatherings with Friends and Family: Once a week    Attends Religious Services: Never    Database administrator or Organizations: No    Attends Engineer, structural: Never    Marital Status: Married     Family History: The  patient's family history includes Cancer in his mother; Stroke in his father.  ROS:   Please see the history of present illness.     All other systems reviewed and are negative.  EKGs/Labs/Other Studies Reviewed:    The following studies were reviewed today:   Lexiscan MPI 07/11/2021:   Abnormal pharmacologic myocardial perfusion stress test.   There is a moderate in size, moderate in severity, fixed  apical inferior, apical lateral, and apical defect most consistent with scar but cannot rule out artifact.   There is no evidence of significant ischemia.   Left ventricular systolic function is moderately to severely reduced (LVEF 30-35%) with global hypokinesis.   Caronary artery calcification, aortic atherosclerosis, and aortic valve prosthesis are seen on the attenuation correction CT.   Small right pleural effusion is present. Asymmetric breast densities (right > left) are incidentally noted; clincial correlation recommended.   Compared to the prior study on 03/18/2018, the fixed apical defect is new.  LVEF has also decreased.   This is an intermediate to high risk study, primarily due to low LVEF. __________   Limited echo 05/18/2021: 1. Left ventricular ejection fraction, by estimation, is 30 %. The left  ventricle has moderately decreased function. The left ventricle  demonstrates global hypokinesis.   2. Right ventricular systolic function is moderately reduced. The right  ventricular size is mildly enlarged. There is mildly elevated pulmonary  artery systolic pressure. The estimated right ventricular systolic  pressure is 38.4 mmHg.   3. Left atrial size was mildly dilated.   4. The mitral valve is normal in structure. No evidence of mitral valve  regurgitation.   5. Tricuspid valve regurgitation is mild to moderate.   6. The aortic valve was not well visualized. Aortic valve regurgitation  is not visualized.   7. The inferior vena cava is normal in size with greater than 50%   respiratory variability, suggesting right atrial pressure of 3 mmHg. __________   2D echo 05/14/2021: 1. Left ventricular ejection fraction, by estimation, is 30 to 35%. The  left ventricle has moderately decreased function. The left ventricle  demonstrates global hypokinesis. The left ventricular internal cavity size  was moderately dilated. Left  ventricular diastolic parameters are indeterminate.   2. Right ventricular systolic function is moderately reduced. The right  ventricular size is moderately enlarged. There is moderately elevated  pulmonary artery systolic pressure. The estimated right ventricular  systolic pressure is 45.0 mmHg.   3. Left atrial size was moderately dilated.   4. The mitral valve is normal in structure. Moderate mitral valve  regurgitation. No evidence of mitral stenosis.   5. Tricuspid valve regurgitation is moderate.   6. The aortic valve has been repaired/replaced. s/p Borive AVR 03/2014,  appropritae gradient noted, max gradient 10 mm Hg. Aortic valve  regurgitation is not visualized. No aortic stenosis is present.   7. The inferior vena cava is dilated in size with <50% respiratory  variability, suggesting right atrial pressure of 15 mmHg. __________   2D echo 09/13/2020: 1. Left ventricular ejection fraction, by estimation, is 50 to 55%. The  left ventricle has low normal function. Left ventricular endocardial  border not optimally defined to evaluate regional wall motion. There is  mild left ventricular hypertrophy. Left  ventricular diastolic parameters are consistent with Grade II diastolic  dysfunction (pseudonormalization). Elevated left atrial pressure.   2. Right ventricular systolic function is normal. The right ventricular  size is normal. There is moderately elevated pulmonary artery systolic  pressure.   3. Left atrial size was moderately dilated.   4. Right atrial size was mildly dilated.   5. The mitral valve is abnormal. Mild mitral  valve regurgitation. No  evidence of mitral stenosis.   6. Tricuspid valve regurgitation is mild to moderate.   7. The aortic valve has been repaired/replaced. Aortic valve  regurgitation is mild to moderate. There is a 23 mm Edwards bioprosthetic  valve present in the aortic position. Procedure Date: 03/01/14. Aortic valve  mean gradient measures 13.0 mmHg.   8. The inferior vena cava is dilated in size with >50% respiratory  variability, suggesting right atrial pressure of 8 mmHg. __________   Eugenie Birks MPI 03/18/2018: Normal pharmacologic myocardial perfusion stress test without ischemia or scar. The left ventricular ejection fraction is normal by visual estimation and Siemens calculation (59%). LVEF by QGS is likely reduced due to gating artifact (42%). This is a low risk study. __________   2D echo 03/14/2018: 1. The left ventricle has normal systolic function with an ejection  fraction of 60-65%. The cavity size was normal. There is mildly increased  left ventricular wall thickness. Left ventricular diastolic Doppler  parameters are consistent with impaired  relaxation.   2. The right ventricle has normal systolic function. The cavity was  normal. There is no increase in right ventricular wall thickness.   3. Left atrial size was mildly dilated.   4. 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  Procedure Date: 03/01/14 Normal aortic valve prosthesis. AV Mean Grad: 16.0  mmHg   5. Right atrial pressure is estimated at 10 mmHg. __________   Luci Bank patch 10/2017: Persistent atrial fibrillation occurred continuously (100% burden), ranging from 58-152 bpm (avg of 95 bpm).   1 run of Ventricular Tachycardia occurred lasting 14.9 secs with a max rate of 193 bpm (avg 161 bpm).  5:46 AM No patient trigger noted   Isolated VEs were rare (<1.0%), VE Couplets were rare (<1.0%), and no VE Triplets were present. __________   2D echo 11/13/2017: - Left ventricle: The  cavity size was normal. Wall thickness was    increased in a pattern of moderate LVH. Systolic function was    mildly reduced. The estimated ejection fraction was in the range    of 45% to 50%. Diffuse hypokinesis.  - Aortic valve: A bioprosthesis was present. Transvalvular velocity    was minimally increased. There was mild stenosis. There was mild    regurgitation. Mean gradient (S): 13 mm Hg.  - Mitral valve: Calcified annulus. There was mild regurgitation.  - Left atrium: The atrium was mildly dilated.  - Right ventricle: The cavity size was mildly dilated. Systolic    function was mildly reduced.  - Right atrium: The atrium was mildly dilated.  - Tricuspid valve: Poorly visualized. There was mild-moderate    regurgitation.  - Inferior vena cava: The vessel was dilated. The respirophasic    diameter changes were in the normal range (>= 50%), consistent    with mildly elevated central venous pressure. __________   2D echo 04/23/2016: - Left ventricle: The cavity size was normal. Wall thickness was    increased increased in a pattern of mild to moderate LVH.    Systolic function was normal. The estimated ejection fraction was    in the range of 55% to 65%. Hypokinesis of the anteroseptal    myocardium. Doppler parameters are consistent with abnormal left    ventricular relaxation (grade 1 diastolic dysfunction).  - Aortic valve: A bioprosthesis was present. Transvalvular velocity    was increased. There was mild stenosis. There was trivial    regurgitation. Peak velocity (S): 277 cm/s. Mean gradient (S): 18    mm Hg. Peak gradient (S): 31 mm Hg.  - Left atrium: The atrium was mildly dilated.  - Right ventricle: Systolic function was normal.  - Pulmonary arteries: Systolic pressure was within the normal  range.    EKG:  EKG is  ordered today.  The ekg ordered today demonstrates SB 59bpm, LAD, RBBB, nonspecific T wave changes  Recent Labs: No results found for requested labs  within last 365 days.  Recent Lipid Panel    Component Value Date/Time   CHOL 135 12/23/2018 0000   TRIG 185 (H) 12/23/2018 0000   HDL 28 (L) 12/23/2018 0000   CHOLHDL 4.8 12/23/2018 0000   VLDL 70 (H) 06/29/2016 1408   LDLCALC 79 12/23/2018 0000     Physical Exam:    VS:  BP (!) 150/74 (BP Location: Left Arm, Patient Position: Sitting, Cuff Size: Normal)   Pulse (!) 59   Ht 5\' 8"  (1.727 m)   Wt 175 lb 3.2 oz (79.5 kg)   SpO2 99%   BMI 26.64 kg/m     Wt Readings from Last 3 Encounters:  09/25/22 175 lb 3.2 oz (79.5 kg)  04/25/22 178 lb 8 oz (81 kg)  01/08/22 174 lb 6 oz (79.1 kg)     GEN:  Well nourished, well developed in no acute distress HEENT: Normal NECK: No JVD; No carotid bruits LYMPHATICS: No lymphadenopathy CARDIAC: RRR, no murmurs, rubs, gallops RESPIRATORY:  Clear to auscultation without rales, wheezing or rhonchi  ABDOMEN: Soft, non-tender, non-distended MUSCULOSKELETAL:  No edema; No deformity  SKIN: Warm and dry NEUROLOGIC:  Alert and oriented x 3 PSYCHIATRIC:  Normal affect   ASSESSMENT:    1. Persistent atrial fibrillation (HCC)   2. ESRD (end stage renal disease) (HCC)   3. S/P AVR (aortic valve replacement)   4. Hyperlipidemia, mixed   5. Orthostatic hypotension   6. Essential hypertension   7. Chronic diastolic heart failure (HCC)    PLAN:    In order of problems listed above:  Persistent Afib H/o cardioversion. He is in NSR. No BB 2/2 bradycardia. Continue Eliquis 2.5mg  BID for stroke ppx. Continue amiodarone 200mg  daily.   CKD stage 4 Previously on HD M,W,F now off Hd. He follows with nephrology. Most reent Scr 3.47.  S/p AVR Stable on recent echo  HLD LDL 69, TG 133, total chol 132, HDL 36. Continue Crestor 10mg  daily  Orthostatic hypotension HTN Patient reports he previously had hypotension and disorientation, so he stopped Imdur and amlodipine. He had a recent episode of sounds like pre-syncope. He is taking Cardura 4mg  BID.  Orthostatics today are positive from lying to sitting. We will keep medications as they are. He will continue to monitor symptoms and BP at home.   Chronic systolic heart failure HFimpEF Most recent echo showed improved EF 30% --> 50-55%He is euvolemic on exam. He takes lasix 40mg  daily. No GDMT due to orthostatic hypotension, bradycardia and CKD.   Disposition: Follow up in 4 month(s) with MD    Signed, Corrinna Karapetyan David Stall, PA-C  09/25/2022 2:42 PM    [facility]

## 2022-10-22 ENCOUNTER — Ambulatory Visit: Payer: Medicare Other | Admitting: Cardiovascular Disease

## 2022-10-26 ENCOUNTER — Other Ambulatory Visit: Payer: Self-pay | Admitting: Cardiovascular Disease

## 2022-10-26 NOTE — Telephone Encounter (Signed)
Please advise If ok to refill historical provider refill.

## 2022-11-21 ENCOUNTER — Telehealth: Payer: Self-pay | Admitting: Cardiovascular Disease

## 2022-11-21 NOTE — Telephone Encounter (Signed)
*  STAT* If patient is at the pharmacy, call can be transferred to refill team.   1. Which medications need to be refilled? (please list name of each medication and dose if known) Eliquis, 2.5mg    2. Would you like to learn more about the convenience, safety, & potential cost savings by using the [facility]? N/A  3. Are you open to using the [facility] N/A   4. Which pharmacy/location (including street and city if local pharmacy) is medication to be sent to? [facility]   5. Do they need a 30 day or 90 day supply? 30day

## 2022-11-21 NOTE — Telephone Encounter (Signed)
Called patient to discuss further-  Left call back number.

## 2022-11-21 NOTE — Telephone Encounter (Signed)
Patient in doughnut hole, requesting 30day supply of Eliquis, please assist.

## 2022-11-22 MED ORDER — APIXABAN 2.5 MG PO TABS: 2.5000 mg | ORAL_TABLET | Freq: Two times a day (BID) | ORAL | 3 refills | Status: DC

## 2022-11-22 MED ORDER — AMLODIPINE BESYLATE 5 MG PO TABS: 5.0000 mg | ORAL_TABLET | Freq: Every day | ORAL | 3 refills | Status: DC

## 2022-11-22 NOTE — Telephone Encounter (Signed)
Patient came into office in regards to his prescriptions. He needs new prescription for his Eliquis and amlodipine. Patient reports taking 5 mg amlodipine at night with his other medications to have better control of his blood pressures. Medication list updated, reviewed, and chart updated. Sent in refills of both Eliquis & Amlodipine. Patient verbalized understanding of our review with no further questions at this time.

## 2023-01-13 NOTE — Progress Notes (Unsigned)
Cardiology Office Note  Date:  01/14/2023   ID:  SADLER MALINAK, DOB 1940-05-30, MRN [id number]  PCP:  Kandyce Rud, MD   Chief Complaint  Patient presents with   4 month follow up     "Doing well." Medications reviewed by the patient verbally.     HPI:  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 at [facility] with bovine valve,  SVT episodes since his AVR, requiring ablation 06/30/2014 at [facility],  diabetes type 2  BPH,  ejection fraction 30% in April 2023 in the setting of atrial fibrillation down from 50% Bradycardia on beta-blockers Chronic renal insufficiency, stage IV, previously on dialysis, followed by nephrology Hypertensive cardiomyopathy, EF 30% April 2023 Repeat echo May 2024 EF 50 to 55% who presents for routine follow-up of his bioprosthetic valve, hypertension , atrial fibrillation  Last seen by myself in clinic 3/24 Seen by one of our providers August 2024 Blood pressure on that visit was running elevated He had stopped isosorbide and amlodipine as blood pressure was running low with systolics in the 90s  In follow-up today reports having high pressure running into the evening Takes amlodipine PRN at dinner Low pressure in AM, some orthostasis symptoms Continues on Lasix 40 daily, no swelling Unclear if he is taking Cardura 8 mg or 4 mg BID Currently not on isosorbide  Lab work reviewed A1c 6.2 Creatinine 3.1 BUN 43 Total cholesterol 134 LDL 72  EKG personally reviewed by myself on todays visit EKG Interpretation Date/Time:  Monday January 14 2023 11:52:11 EST Ventricular Rate:  63 PR Interval:  188 QRS Duration:  184 QT Interval:  508 QTC Calculation: 519 R Axis:   -45  Text Interpretation: Normal sinus rhythm 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 block When compared with ECG of 25-Sep-2022 13:43, No  significant change was found Confirmed by Julien Nordmann [phone]) on 01/14/2023 12:12:45 PM   Other past medical history reviewed Hospitalized April 2023 CHF, EF at that time 30 to 35% in the setting of atrial fibrillation Underwent cardioversion EF low 30% Admission complicated by acute renal failure requiring hemodialysis Completed HD for 6 weeks Catheter was removed as renal function improved  Myoview July 11, 2021 no significant ischemia, moderate fixed defect noted apical inferior, apical lateral and apical region  Hospitalized June 15 encephalopathy, possible syncope, heart rate 32 glucose 63 in the field Carvedilol held  hospital July 2023, syncope  heart rate of 32 and glucose of 63.  Brought into the emergency room and found to have acute kidney injury with creatinine of 2.56  Glipizide and Actos discontinued  Orthostatics positive 08/30/21 in the office Lasix down to 40 daily  Zio monitor August 2023 Normal sinus rhythm Patient had a min HR of 44 bpm, max HR of 179 bpm, and avg HR of 64 bpm.  Bundle Branch Block/IVCD was present.    1 run of Ventricular Tachycardia occurred lasting 4 beats with a max rate of 179 bpm (avg 148 bpm).  Not triggered   1 run of Supraventricular Tachycardia occurred lasting 11 beats with a max rate of 107 bpm (avg 95 bpm).  Isolated SVEs were rare (<1.0%), SVE Couplets were rare (<1.0%), and SVE Triplets were rare (<1.0%).  Isolated VEs were rare (<1.0%), and no VE Couplets or VE Triplets were present.   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  PSH:    Past Surgical History:  Procedure Laterality Date   AORTIC VALVE REPLACEMENT     CARDIAC CATHETERIZATION     CARDIOVERSION N/A 01/01/2018   Procedure: CARDIOVERSION;  Surgeon: Antonieta Iba, MD;  Location: [facility] ORS;  Service: Cardiovascular;  Laterality: N/A;   CARDIOVERSION N/A 05/16/2021   Procedure: CARDIOVERSION;  Surgeon: Yvonne Kendall, MD;  Location: [facility] OR[facility];  Service: Cardiovascular;  Laterality: N/A;   CARDIOVERSION N/A 05/17/2021   Procedure: CARDIOVERSION;  Surgeon: Debbe Odea, MD;  Location: [facility] OR[facility];  Service: Cardiovascular;  Laterality: N/A;   COLONOSCOPY  2010   DIALYSIS/PERMA CATHETER INSERTION N/A 05/26/2021   Procedure: DIALYSIS/PERMA CATHETER INSERTION;  Surgeon: Renford Dills, MD;  Location: [facility];  Service: Cardiovascular;  Laterality: N/A;   DIALYSIS/PERMA CATHETER REMOVAL N/A 08/04/2021   Procedure: DIALYSIS/PERMA CATHETER REMOVAL;  Surgeon: Renford Dills, MD;  Location: [facility];  Service: Cardiovascular;  Laterality: N/A;   heart valve replacment  2015   TEMPORARY DIALYSIS CATHETER N/A 05/23/2021   Procedure: TEMPORARY DIALYSIS CATHETER;  Surgeon: Renford Dills, MD;  Location: [facility];  Service: Cardiovascular;  Laterality: N/A;   XI ROBOTIC ASSISTED INGUINAL  HERNIA REPAIR WITH MESH Right 01/07/2019   Procedure: XI ROBOTIC ASSISTED INGUINAL HERNIA REPAIR WITH MESH;  Surgeon: Campbell Lerner, MD;  Location: [facility] OR[facility];  Service: General;  Laterality: Right;    Current Outpatient Medications  Medication Sig Dispense Refill   amiodarone (PACERONE) 200 MG tablet Take 1 tablet (200 mg total) by mouth daily. 90 tablet 3   amLODipine (NORVASC) 5 MG tablet Take 5 mg by mouth as needed.     doxazosin (CARDURA) 8 MG tablet Take 4 mg by mouth 2 (two) times daily.     furosemide (LASIX) 40 MG tablet Take 1 tablet (40 mg total) by mouth daily. 90 tablet 3   glipiZIDE (GLUCOTROL) 10 MG tablet Take 10 mg by mouth daily before breakfast.     levothyroxine (SYNTHROID) 25 MCG tablet Take 25 mcg by mouth daily before breakfast.     rosuvastatin (CRESTOR) 10 MG tablet Take 1 tablet (10 mg total) by mouth daily. 90 tablet 0   apixaban (ELIQUIS) 2.5 MG TABS tablet Take 1 tablet (2.5 mg total) by mouth 2 (two) times daily. 28 tablet    No current facility-administered medications for this visit.    Allergies:   Gabapentin, Hydralazine, and Hydralazine hcl   Social History:  The patient  reports that he quit smoking about 50 years ago. His smoking use included cigarettes. He started smoking about 51 years ago. He has a 0.5 pack-year  smoking history. He has never used smokeless tobacco. He reports that he does not drink alcohol and does not use drugs.   Family History:   family history includes Cancer in his mother; Stroke in his father.   Review of Systems: Review of Systems  Constitutional: Negative.   HENT: Negative.    Respiratory: Negative.    Cardiovascular: Negative.   Gastrointestinal: Negative.   Musculoskeletal: Negative.   Neurological: Negative.   Psychiatric/Behavioral: Negative.    All other systems reviewed and are negative.   PHYSICAL EXAM: VS:  BP (!) 140/60 (BP Location: Left Arm, Patient Position: Sitting, Cuff Size: Normal)   Pulse 62    Ht 5\' 8"  (1.727 m)   Wt 179 lb (81.2 kg)   SpO2 95%   BMI 27.22 kg/m  , BMI Body mass index is 27.22 kg/m. Constitutional:  oriented to person, place, and time. No distress.  HENT:  Head: Grossly normal Eyes:  no discharge. No scleral icterus.  Neck: No JVD, no carotid bruits  Cardiovascular: Regular rate and rhythm, no murmurs appreciated Pulmonary/Chest: Clear to auscultation bilaterally, no wheezes or rails Abdominal: Soft.  no distension.  no tenderness.  Musculoskeletal: Normal range of motion Neurological:  normal muscle tone. Coordination normal. No atrophy Skin: Skin warm and dry Psychiatric: normal affect, pleasant  Recent Labs: No results found for requested labs within last 365 days.    Lipid Panel Lab Results  Component Value Date   CHOL 135 12/23/2018   HDL 28 (L) 12/23/2018   LDLCALC 79 12/23/2018   TRIG 185 (H) 12/23/2018    Wt Readings from Last 3 Encounters:  01/14/23 179 lb (81.2 kg)  09/25/22 175 lb 3.2 oz (79.5 kg)  04/25/22 178 lb 8 oz (81 kg)     ASSESSMENT AND PLAN:  Atrial fibrillation, persistent Cardioversion Maintaining normal sinus rhythm Not on beta-blocker secondary to bradycardia Continue amiodarone with Eliquis renal dose, age over 25  chronic renal failure, end-stage renal Followed by nephrology, creatinine 3.1 Previously required hemodialysis 3 days a week Monday Wednesday Friday for 6 weeks, now no longer on hemodialysis, port has been removed Continue Lasix 40 daily, he has indicated he may not be interested in long-term dialysis  Leg swelling No swelling on today's visit  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 Ejection fraction 50 to 55% in August 2022 drop in ejection fraction 30 to 35%, Repeat echo with improved ejection fraction 50% Myoview with no large regions of ischemia Not a good candidate for ACE, ARB, Entresto, SGLT2 inhibitor in the setting of renal dysfunction and hypoglycemia Beta-blockers  held for bradycardia Recommend he take Cardura 4 mg twice daily, amlodipine at noon Took himself off isosorbide secondary to orthostasis symptoms  Essential hypertension Continue Cardura 4 twice daily, amlodipine 5 mg at noon  Diabetes: A1c numbers improved 6 range down from 8  Arteriosclerosis of coronary artery - Plan: EKG 12-Lead Not a good candidate for cardiac catheterization given renal dysfunction Myoview has been completed, No high risk ischemia  S/P AVR (aortic valve replacement) - Plan: EKG 12-Lead On echo, stable prosthetic valve  Hyperlipidemia Continue Crestor    Orders Placed This Encounter  Procedures   EKG 12-Lead     Signed, Dossie Arbour, M.D., Ph.D. 01/14/2023  [facility] [place], [place] [phone]

## 2023-01-14 ENCOUNTER — Encounter: Payer: Self-pay | Admitting: Cardiovascular Disease

## 2023-01-14 ENCOUNTER — Ambulatory Visit: Payer: Medicare Other | Attending: Cardiovascular Disease | Admitting: Cardiovascular Disease

## 2023-01-14 VITALS — BP 140/60 | HR 62 | Ht 68.0 in | Wt 179.0 lb

## 2023-01-14 DIAGNOSIS — I25118 Atherosclerotic heart disease of native coronary artery with other forms of angina pectoris: Secondary | ICD-10-CM

## 2023-01-14 DIAGNOSIS — Z952 Presence of prosthetic heart valve: Secondary | ICD-10-CM

## 2023-01-14 DIAGNOSIS — I5032 Chronic diastolic (congestive) heart failure: Secondary | ICD-10-CM

## 2023-01-14 DIAGNOSIS — E782 Mixed hyperlipidemia: Secondary | ICD-10-CM | POA: Diagnosis not present

## 2023-01-14 DIAGNOSIS — I5022 Chronic systolic (congestive) heart failure: Secondary | ICD-10-CM

## 2023-01-14 DIAGNOSIS — N186 End stage renal disease: Secondary | ICD-10-CM | POA: Diagnosis not present

## 2023-01-14 DIAGNOSIS — I4819 Other persistent atrial fibrillation: Secondary | ICD-10-CM

## 2023-01-14 DIAGNOSIS — I10 Essential (primary) hypertension: Secondary | ICD-10-CM

## 2023-01-14 DIAGNOSIS — I420 Dilated cardiomyopathy: Secondary | ICD-10-CM

## 2023-01-14 MED ORDER — APIXABAN 2.5 MG PO TABS: 2.5000 mg | ORAL_TABLET | Freq: Two times a day (BID) | ORAL | Status: DC

## 2023-01-14 MED ORDER — DOXAZOSIN MESYLATE 4 MG PO TABS: 4.0000 mg | ORAL_TABLET | Freq: Two times a day (BID) | ORAL | 6 refills | Status: DC

## 2023-01-14 MED ORDER — AMLODIPINE BESYLATE 5 MG PO TABS: 5.0000 mg | ORAL_TABLET | Freq: Every day | ORAL | 3 refills | Status: DC

## 2023-01-14 NOTE — Patient Instructions (Addendum)
Medication Instructions:  Please take cardura 4 mg twice a day (not 8 mg) Take amlodipine 5 mg at noon  If you need a refill on your cardiac medications before your next appointment, please call your pharmacy.   Lab work: No new labs needed  Testing/Procedures: No new testing needed  Follow-Up: At [facility], you and your health needs are our priority.  As part of our continuing mission to provide you with exceptional heart care, we have created designated Provider Care Teams.  These Care Teams include your primary Cardiologist (physician) and Advanced Practice Providers (APPs -  Physician Assistants and Nurse Practitioners) who all work together to provide you with the care you need, when you need it.  You will need a follow up appointment in 6 months  Providers on your designated Care Team:   Nicolasa Ducking, NP Eula Listen, PA-C Cadence Fransico Michael, [place]  COVID-19 Vaccine Information can be found at: [email or web address] For questions related to vaccine distribution or appointments, please email [email or web address] or call [phone].

## 2023-03-26 ENCOUNTER — Other Ambulatory Visit: Payer: Self-pay | Admitting: Cardiovascular Disease

## 2023-04-15 ENCOUNTER — Telehealth: Payer: Self-pay | Admitting: Cardiovascular Disease

## 2023-04-15 NOTE — Telephone Encounter (Signed)
 See other telephone encounter.

## 2023-04-15 NOTE — Telephone Encounter (Signed)
 Called and spoke with patient. Patient states that has been taking Amlodipine 5 MG daily with a second dose in the evening as needed. Prescription from last office visit was for once daily. Patient states that his systolic blood pressure is in the 150's at times in the evenings. Patient requesting a new prescription to be sent in for Amlodipine daily with a second as needed. Will forward to MD for review.

## 2023-04-15 NOTE — Telephone Encounter (Signed)
 Pt c/o medication issue:  1. Name of Medication: amLODipine (NORVASC) 5 MG tablet   2. How are you currently taking this medication (dosage and times per day)? Take 1 tablet (5 mg total) by mouth daily. Take at noon   3. Are you having a reaction (difficulty breathing--STAT)? No  4. What is your medication issue? Per pharmacy, patient inform them that Dr. Mariah Milling told the patient to increase the dosage to two tablets by mouth daily. The pharmacy stated the patient ran out of medication early due to take the higher dosage. The pharmacy is requesting for [place] to send the new prescription for 2 tablets by mouth daily of the 5MG  tablets. Pharmacy requested for [place] to call the patient if we have questions.

## 2023-04-15 NOTE — Telephone Encounter (Signed)
 Pt c/o medication issue:  1. Name of Medication: amLODipine (NORVASC) 5 MG tablet   2. How are you currently taking this medication (dosage and times per day)? 1 Tablet twice daily  3. Are you having a reaction (difficulty breathing--STAT)? No   4. What is your medication issue? Needs new script sent in because he is now taking it twice daily

## 2023-04-16 ENCOUNTER — Telehealth: Payer: Self-pay | Admitting: Cardiovascular Disease

## 2023-04-16 MED ORDER — AMLODIPINE BESYLATE 5 MG PO TABS: 5.0000 mg | ORAL_TABLET | Freq: Every day | ORAL | 3 refills | Status: DC

## 2023-04-16 MED ORDER — AMLODIPINE BESYLATE 5 MG PO TABS: 5.0000 mg | ORAL_TABLET | Freq: Every day | ORAL | 3 refills | Status: AC

## 2023-04-16 NOTE — Addendum Note (Signed)
 Addended by: Jani Gravel on: 04/16/2023 02:27 PM   Modules accepted: Orders

## 2023-04-16 NOTE — Telephone Encounter (Signed)
 Pt c/o medication issue:  1. Name of Medication: Amlodipine  2. How are you currently taking this medication (dosage and times per day)?   3. Are you having a reaction (difficulty breathing--STAT)?   4. What is your medication issue? Need the directions completed for the refill that was sent over today please

## 2023-04-16 NOTE — Telephone Encounter (Signed)
 Called and spoke with son per DPR. Notified him of the following from Dr. Mariah Milling.  Ok to send in amlodipine 5 mg daily with extra amlodipine 5 for systolic pressure over 150 daily as needed  Thanks  T Mariah Milling   Son verbalized understanding. Prescription sent to preferred pharmacy.

## 2023-05-08 ENCOUNTER — Other Ambulatory Visit: Payer: Self-pay | Admitting: Cardiovascular Disease

## 2023-06-28 ENCOUNTER — Other Ambulatory Visit: Payer: Self-pay | Admitting: Cardiovascular Disease

## 2023-06-28 MED ORDER — DOXAZOSIN MESYLATE 4 MG PO TABS: 4.0000 mg | ORAL_TABLET | Freq: Two times a day (BID) | ORAL | 2 refills | Status: AC

## 2023-07-14 NOTE — Progress Notes (Unsigned)
 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 at [facility] with bovine valve,  SVT episodes since his AVR, requiring ablation 06/30/2014 at [facility],  diabetes type 2  BPH,  ejection fraction 30% in April 2023 in the setting of atrial fibrillation down from 50% Bradycardia on beta-blockers Chronic renal insufficiency, stage IV, previously on dialysis, followed by nephrology, GFR 19 Hypertensive cardiomyopathy, EF 30% April 2023 Repeat echo May 2024 EF 50 to 55% who presents for routine follow-up of his bioprosthetic valve, hypertension , atrial fibrillation  Last seen by myself in clinic 12/24 Feels tired at times Tired after working on tractor, when working with his chainsaw, Has to take breaks Otherwise tries to stay active  Establishing primary care through the [place], has benefits New hearing aid  Denies chest pain, no shortness of breath, no leg swelling, no PND orthopnea Denies tachycardia or palpitations concerning for arrhythmia Reports  Pressure at home well-controlled on amlodipine  5 daily, Cardura  4 twice daily Isosorbide  previously held  Lab work reviewed A1c 6.2 Creatinine 3.1 BUN 43 Total cholesterol 134 LDL 72  EKG personally reviewed by myself on todays visit EKG Interpretation Date/Time:  Monday July 15 2023 14:57:23 EDT Ventricular Rate:  74 PR Interval:  174 QRS Duration:  174 QT Interval:  520 QTC Calculation: 577 R Axis:   -65  Text Interpretation: Normal sinus rhythm Right bundle branch block Left anterior  fascicular block Bifascicular block Inferior infarct , age undetermined When compared with ECG of 14-Jan-2023 11:52, QT has lengthened Confirmed by Jasira Robinson [phone]) on 07/15/2023 3:07:53 PM   Other past medical history reviewed Hospitalized April 2023 CHF, EF at that time 30 to 35% in the setting of atrial fibrillation Underwent cardioversion EF low 30% Admission complicated by acute renal failure requiring hemodialysis Completed HD for 6 weeks Catheter was removed as renal function improved  Myoview  July 11, 2021 no significant ischemia, moderate fixed defect noted apical inferior, apical lateral and apical region  Hospitalized June 15 encephalopathy, possible syncope, heart rate 32 glucose 63 in the field Carvedilol  held  hospital July 2023, syncope  heart rate of 32 and glucose of 63.  Brought into the emergency room and found to have acute kidney injury with creatinine of 2.56  Glipizide  and Actos  discontinued  Orthostatics positive 08/30/21 in the office Lasix  down to 40 daily  Zio monitor August 2023 Normal sinus rhythm Patient had a min HR of 44 bpm, max HR of 179 bpm, and avg HR of 64 bpm.  Bundle Branch Block/IVCD was present.    1 run of Ventricular Tachycardia occurred lasting 4 beats with a max rate of 179 bpm (avg 148 bpm).  Not triggered   1 run of Supraventricular Tachycardia occurred lasting 11 beats with a max rate of 107 bpm (avg 95 bpm).  Isolated SVEs were rare (<1.0%), SVE Couplets were rare (<1.0%), and SVE Triplets were rare (<1.0%).  Isolated VEs were rare (<1.0%), and no VE Couplets or VE Triplets were present.  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  PSH:    Past Surgical History:  Procedure Laterality Date   AORTIC VALVE REPLACEMENT     CARDIAC CATHETERIZATION     CARDIOVERSION N/A 01/01/2018   Procedure: CARDIOVERSION;  Surgeon: Devorah Fonder, MD;  Location: [facility] ORS;  Service: Cardiovascular;  Laterality: N/A;   CARDIOVERSION N/A 05/16/2021   Procedure: CARDIOVERSION;  Surgeon: Sammy Crisp, MD;  Location: [facility] OR[facility];  Service: Cardiovascular;  Laterality: N/A;   CARDIOVERSION N/A 05/17/2021   Procedure: CARDIOVERSION;  Surgeon: Constancia Delton, MD;  Location: [facility] ORS;  Service: Cardiovascular;  Laterality: N/A;   COLONOSCOPY  2010   DIALYSIS/PERMA CATHETER INSERTION N/A 05/26/2021   Procedure: DIALYSIS/PERMA CATHETER INSERTION;  Surgeon: Jackquelyn Mass, MD;  Location: [facility] INVASIVE CV LAB;  Service: Cardiovascular;  Laterality: N/A;   DIALYSIS/PERMA CATHETER REMOVAL N/A 08/04/2021   Procedure: DIALYSIS/PERMA CATHETER REMOVAL;  Surgeon: Jackquelyn Mass, MD;  Location: [facility] INV[facility]IVE CV LAB;  Service: Cardiovascular;  Laterality: N/A;   heart valve replacment  2015   TEMPORARY DIALYSIS CATHETER N/A 05/23/2021   Procedure: TEMPORARY DIALYSIS CATHETER;  Surgeon: Jackquelyn Mass, MD;   Location: [facility];  Service: Cardiovascular;  Laterality: N/A;   XI ROBOTIC ASSISTED INGUINAL HERNIA REPAIR WITH MESH Right 01/07/2019   Procedure: XI ROBOTIC ASSISTED INGUINAL HERNIA REPAIR WITH MESH;  Surgeon: Flynn Hylan, MD;  Location: [facility] OR[facility];  Service: General;  Laterality: Right;    Current Outpatient Medications  Medication Sig Dispense Refill   amiodarone  (PACERONE ) 200 MG tablet TAKE ONE TABLET (200 MG TOTAL) BY MOUTH DAILY. 90 tablet 0   amLODipine  (NORVASC ) 5 MG tablet Take 1 tablet (5 mg total) by mouth daily. Take at noon. May take an additional tablet (5 MG) in the evening as needed for systolic blood pressure greater then 150. 180 tablet 3   apixaban  (ELIQUIS ) 2.5 MG TABS tablet Take 1 tablet (2.5 mg total) by mouth 2 (two) times daily. 28 tablet    calcitRIOL (ROCALTROL) 0.25 MCG capsule Take 0.25 mcg by mouth.     doxazosin  (CARDURA ) 4 MG tablet Take 1 tablet (4 mg total) by mouth 2 (two) times daily. 180 tablet 2   furosemide  (LASIX ) 40 MG tablet TAKE ONE TABLET (40 MG TOTAL) BY MOUTH DAILY. 90 tablet 3   glipiZIDE  (GLUCOTROL ) 10 MG tablet Take 10 mg by mouth daily before breakfast.     levothyroxine (SYNTHROID) 25 MCG tablet Take 25 mcg by mouth daily before breakfast.     rosuvastatin  (CRESTOR ) 10 MG tablet Take 1 tablet (10 mg total) by mouth daily. 90 tablet 0  No current facility-administered medications for this visit.    Allergies:   Gabapentin , Hydralazine, and Hydralazine hcl   Social History:  The patient  reports that he quit smoking about 51 years ago. His smoking use included cigarettes. He started smoking about 52 years ago. He has a 0.5 pack-year smoking history. He has never used smokeless tobacco. He reports that he does not drink alcohol and does not use drugs.   Family History:   family history includes Cancer in his mother; Stroke in his father.   Review of Systems: Review of Systems  Constitutional: Negative.   HENT: Negative.     Respiratory: Negative.    Cardiovascular: Negative.   Gastrointestinal: Negative.   Musculoskeletal: Negative.   Neurological: Negative.   Psychiatric/Behavioral: Negative.    All other systems reviewed and are negative.   PHYSICAL EXAM: VS:  BP 122/62 (BP Location: Left Arm, Patient Position: Sitting, Cuff Size: Normal)   Pulse 74   Ht 5' 8 (1.727 m)   Wt 178 lb (80.7 kg)   SpO2 99%   BMI 27.06 kg/m  , BMI Body mass index is 27.06 kg/m. Constitutional:  oriented to person, place, and time. No distress.  HENT:  Head: Grossly normal Eyes:  no discharge. No scleral icterus.  Neck: No JVD, no carotid bruits  Cardiovascular: Regular rate and rhythm, no murmurs appreciated Pulmonary/Chest: Clear to auscultation bilaterally, no wheezes or rales Abdominal: Soft.  no distension.  no tenderness.  Musculoskeletal: Normal range of motion Neurological:  normal muscle tone. Coordination normal. No atrophy Skin: Skin warm and dry Psychiatric: normal affect, pleasant  Recent Labs: No results found for requested labs within last 365 days.    Lipid Panel Lab Results  Component Value Date   CHOL 135 12/23/2018   HDL 28 (L) 12/23/2018   LDLCALC 79 12/23/2018   TRIG 185 (H) 12/23/2018    Wt Readings from Last 3 Encounters:  07/15/23 178 lb (80.7 kg)  01/14/23 179 lb (81.2 kg)  09/25/22 175 lb 3.2 oz (79.5 kg)     ASSESSMENT AND PLAN:  Atrial fibrillation, persistent History of cardioversion Maintaining normal sinus rhythm Not on beta-blocker secondary to bradycardia Continue amiodarone  with Eliquis  2.5 twice daily Reduced dosing for renal failure, age over 8  chronic renal failure, end-stage renal Followed by nephrology, creatinine 3.1 Stable numbers Previously required hemodialysis 3 days a week Monday Wednesday Friday for 6 weeks, now no longer on hemodialysis, port has been removed Remains on Lasix  40 daily  Leg swelling No swelling on today's visit  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 Ejection fraction 50 to 55% in August 2022 drop in ejection fraction 30 to 35%, Repeat echo with improved ejection fraction 50% Myoview  with no large regions of ischemia Not a good candidate for ACE, ARB, Entresto, SGLT2 inhibitor in the setting of renal dysfunction and hypoglycemia Beta-blockers held for bradycardia Recommend he continue Cardura  4 mg twice daily, amlodipine  at noon Took himself off isosorbide  secondary to orthostasis symptoms  Essential hypertension Continue Cardura  4 twice daily, amlodipine  5 mg at noon  Diabetes: A1c numbers improved 6 range down from 8  Arteriosclerosis of coronary artery - Plan: EKG 12-Lead Not a good candidate for cardiac catheterization given renal dysfunction Myoview  has been completed, No high risk ischemia  S/P AVR (aortic valve replacement) - Plan: EKG 12-Lead On echo, stable prosthetic valve  Hyperlipidemia Continue Crestor , goal LDL less than 70    Orders Placed This Encounter  Procedures  EKG 12-Lead     Signed, Juanda Noon, M.D., Ph.D. 07/15/2023  [facility] [place], [place] [phone]

## 2023-07-15 ENCOUNTER — Ambulatory Visit: Attending: Cardiovascular Disease | Admitting: Cardiovascular Disease

## 2023-07-15 ENCOUNTER — Encounter: Payer: Self-pay | Admitting: Cardiovascular Disease

## 2023-07-15 DIAGNOSIS — I4819 Other persistent atrial fibrillation: Secondary | ICD-10-CM | POA: Diagnosis not present

## 2023-07-15 DIAGNOSIS — E782 Mixed hyperlipidemia: Secondary | ICD-10-CM

## 2023-07-15 DIAGNOSIS — I5032 Chronic diastolic (congestive) heart failure: Secondary | ICD-10-CM

## 2023-07-15 DIAGNOSIS — I951 Orthostatic hypotension: Secondary | ICD-10-CM

## 2023-07-15 DIAGNOSIS — I10 Essential (primary) hypertension: Secondary | ICD-10-CM

## 2023-07-15 DIAGNOSIS — I25118 Atherosclerotic heart disease of native coronary artery with other forms of angina pectoris: Secondary | ICD-10-CM

## 2023-07-15 DIAGNOSIS — Z952 Presence of prosthetic heart valve: Secondary | ICD-10-CM

## 2023-07-15 DIAGNOSIS — I5022 Chronic systolic (congestive) heart failure: Secondary | ICD-10-CM

## 2023-07-15 DIAGNOSIS — N186 End stage renal disease: Secondary | ICD-10-CM | POA: Diagnosis not present

## 2023-07-15 DIAGNOSIS — I420 Dilated cardiomyopathy: Secondary | ICD-10-CM

## 2023-07-15 MED ORDER — AMIODARONE HCL 200 MG PO TABS: 200.0000 mg | ORAL_TABLET | Freq: Every day | ORAL | 2 refills | Status: AC

## 2023-07-15 NOTE — Patient Instructions (Signed)
 Medication Instructions:  ?No changes ? ?If you need a refill on your cardiac medications before your next appointment, please call your pharmacy.  ? ?Lab work: ?No new labs needed ? ?Testing/Procedures: ?No new testing needed ? ?Follow-Up: ?At [facility], you and your health needs are our priority.  As part of our continuing mission to provide you with exceptional heart care, we have created designated Provider Care Teams.  These Care Teams include your primary Cardiologist (physician) and Advanced Practice Providers (APPs -  Physician Assistants and Nurse Practitioners) who all work together to provide you with the care you need, when you need it. ? ?You will need a follow up appointment in 6 months, APP ok ? ?Providers on your designated Care Team:   ?Nicolasa Ducking, NP ?Eula Listen, PA-C ?Cadence Fransico Michael, PA-C ? ?COVID-19 Vaccine Information can be found at: [email or web address] For questions related to vaccine distribution or appointments, please email [email or web address] or call [phone].  ? ?

## 2023-10-13 ENCOUNTER — Emergency Department
Admission: EM | Admit: 2023-10-13 | Discharge: 2023-10-13 | Disposition: A | Discharge status: Home / Self Care | Attending: Emergency Medicine | Admitting: Emergency Medicine

## 2023-10-13 ENCOUNTER — Other Ambulatory Visit: Payer: Self-pay

## 2023-10-13 DIAGNOSIS — E1122 Type 2 diabetes mellitus with diabetic chronic kidney disease: Secondary | ICD-10-CM | POA: Diagnosis not present

## 2023-10-13 DIAGNOSIS — N184 Chronic kidney disease, stage 4 (severe): Secondary | ICD-10-CM | POA: Diagnosis not present

## 2023-10-13 DIAGNOSIS — I4891 Unspecified atrial fibrillation: Secondary | ICD-10-CM | POA: Insufficient documentation

## 2023-10-13 DIAGNOSIS — U071 COVID-19: Secondary | ICD-10-CM | POA: Diagnosis not present

## 2023-10-13 DIAGNOSIS — R42 Dizziness and giddiness: Secondary | ICD-10-CM | POA: Insufficient documentation

## 2023-10-13 DIAGNOSIS — I129 Hypertensive chronic kidney disease with stage 1 through stage 4 chronic kidney disease, or unspecified chronic kidney disease: Secondary | ICD-10-CM | POA: Insufficient documentation

## 2023-10-13 DIAGNOSIS — Z7901 Long term (current) use of anticoagulants: Secondary | ICD-10-CM | POA: Diagnosis not present

## 2023-10-13 LAB — COMPREHENSIVE METABOLIC PANEL WITH GFR
ALT: 83 U/L — ABNORMAL HIGH (ref 0–44)
AST: 86 U/L — ABNORMAL HIGH (ref 15–41)
Albumin: 3.9 g/dL (ref 3.5–5.0)
Alkaline Phosphatase: 69 U/L (ref 38–126)
Anion gap: 13 (ref 5–15)
BUN: 43 mg/dL — ABNORMAL HIGH (ref 8–23)
CO2: 20 mmol/L — ABNORMAL LOW (ref 22–32)
Calcium: 8.6 mg/dL — ABNORMAL LOW (ref 8.9–10.3)
Chloride: 106 mmol/L (ref 98–111)
Creatinine, Ser: 3.47 mg/dL — ABNORMAL HIGH (ref 0.61–1.24)
GFR, Estimated: 17 mL/min — ABNORMAL LOW (ref >60)
Glucose, Bld: 108 mg/dL — ABNORMAL HIGH (ref 70–99)
Potassium: 3.6 mmol/L (ref 3.5–5.1)
Sodium: 139 mmol/L (ref 135–145)
Total Bilirubin: 0.9 mg/dL (ref 0.0–1.2)
Total Protein: 7.1 g/dL (ref 6.5–8.1)

## 2023-10-13 LAB — CBC
HCT: 26.6 % — ABNORMAL LOW (ref 39.0–52.0)
Hemoglobin: 8.9 g/dL — ABNORMAL LOW (ref 13.0–17.0)
MCH: 30.7 pg (ref 26.0–34.0)
MCHC: 33.5 g/dL (ref 30.0–36.0)
MCV: 91.7 fL (ref 80.0–100.0)
Platelets: 163 K/uL (ref 150–400)
RBC: 2.9 MIL/uL — ABNORMAL LOW (ref 4.22–5.81)
RDW: 13.2 % (ref 11.5–15.5)
WBC: 6.1 K/uL (ref 4.0–10.5)
nRBC: 0 % (ref 0.0–0.2)

## 2023-10-13 LAB — TROPONIN I (HIGH SENSITIVITY): Troponin I (High Sensitivity): 17 ng/L (ref <18)

## 2023-10-13 LAB — RESP PANEL BY RT-PCR (RSV, FLU A&B, COVID)  RVPGX2
Influenza A by PCR: NEGATIVE
Influenza B by PCR: NEGATIVE
Resp Syncytial Virus by PCR: NEGATIVE
SARS Coronavirus 2 by RT PCR: POSITIVE — AB

## 2023-10-13 MED ORDER — ACETAMINOPHEN 500 MG PO TABS
1000.0000 mg | ORAL_TABLET | Freq: Once | ORAL | Status: AC
  Administered 2023-10-13: 1000 mg via ORAL
  Filled 2023-10-13: qty 2

## 2023-10-13 MED ORDER — LACTATED RINGERS IV BOLUS
1000.0000 mL | Freq: Once | INTRAVENOUS | Status: DC
Start: 2023-10-13 — End: 2023-10-13

## 2023-10-13 NOTE — Discharge Instructions (Addendum)
 Use Tylenol  for pain and fevers.  Up to 1000 mg per dose, up to 4 times per day.  Do not take more than 4000 mg of Tylenol /acetaminophen  within 24 hours..  Return to the ED with any worsening symptoms, passing out, chest pain or other concerns

## 2023-10-13 NOTE — ED Provider Notes (Signed)
 [facility] Provider Note    Event Date/Time   First MD Initiated Contact with Patient 10/13/23 1951     (approximate)   History   Dizziness   HPI  Timothy Riley is a 83 y.o. male who presents to the ED for evaluation of Dizziness   I reviewed nephrology clinic visit from last month.  History of DM, CKD 4, HTN and anemia of chronic disease.  History of A-fib on Eliquis   Patient presents to the ED for evaluation of bilateral leg heaviness, head pressure and intermittent dizziness over the past couple days.  He has difficulty describing his dizziness but primarily reports heaviness sensation in his head.   No chest pain, shortness of breath, falls or syncope, cough.  He did have an episode of chills last night.  Reports feeling fine right now.  He had gone to an urgent care and they told him he had a heart murmur and because of this to come to the ED  He has a history of bioprosthetic AVR with an echo last year demonstrating mild regurgitation   Physical Exam   Triage Vital Signs: ED Triage Vitals  Encounter Vitals Group     BP 10/13/23 1635 (!) 103/50     Girls Systolic BP Percentile --      Girls Diastolic BP Percentile --      Boys Systolic BP Percentile --      Boys Diastolic BP Percentile --      Pulse Rate 10/13/23 1635 72     Resp 10/13/23 1635 16     Temp 10/13/23 1635 99.1 F (37.3 C)     Temp Source 10/13/23 1635 Oral     SpO2 10/13/23 1635 100 %     Weight 10/13/23 1633 178 lb (80.7 kg)     Height --      Head Circumference --      Peak Flow --      Pain Score 10/13/23 1632 0     Pain Loc --      Pain Education --      Exclude from Growth Chart --     Most recent vital signs: Vitals:   10/13/23 2000 10/13/23 2034  BP: (!) 158/77   Pulse: 72   Resp: 17   Temp:  100 F (37.8 C)  SpO2: 100%     General: Awake, no distress.  CV:  Good peripheral perfusion.  2/6 holosystolic murmur Resp:  Normal effort.  Abd:  No  distention.  MSK:  No deformity noted.  Neuro:  No focal deficits appreciated. Other:     ED Results / Procedures / Treatments   Labs (all labs ordered are listed, but only abnormal results are displayed) Labs Reviewed  RESP PANEL BY RT-PCR (RSV, FLU A&B, COVID)  RVPGX2 - Abnormal; Notable for the following components:      Result Value   SARS Coronavirus 2 by RT PCR POSITIVE (*)    All other components within normal limits  COMPREHENSIVE METABOLIC PANEL WITH GFR - Abnormal; Notable for the following components:   CO2 20 (*)    Glucose, Bld 108 (*)    BUN 43 (*)    Creatinine, Ser 3.47 (*)    Calcium  8.6 (*)    AST 86 (*)    ALT 83 (*)    GFR, Estimated 17 (*)    All other components within normal limits  CBC - Abnormal; Notable for the following components:  RBC 2.90 (*)    Hemoglobin 8.9 (*)    HCT 26.6 (*)    All other components within normal limits  URINALYSIS, ROUTINE W REFLEX MICROSCOPIC  CBG MONITORING, ED  TROPONIN I (HIGH SENSITIVITY)    EKG Sinus rhythm with a rate of 75 bpm, right bundle, no STEMI  RADIOLOGY   Official radiology report(s): No results found.  PROCEDURES and INTERVENTIONS:  .1-3 Lead EKG Interpretation  Performed by: Claudene Rover, MD Authorized by: Claudene Rover, MD     Interpretation: normal     ECG rate:  70   ECG rate assessment: normal     Rhythm: sinus rhythm     Ectopy: none     Conduction: normal     Medications  acetaminophen  (TYLENOL ) tablet 1,000 mg (has no administration in time range)  lactated ringers  bolus 1,000 mL (has no administration in time range)     IMPRESSION / MDM / ASSESSMENT AND PLAN / ED COURSE  I reviewed the triage vital signs and the nursing notes.  Differential diagnosis includes, but is not limited to, symptomatic anemia, AKI, BPPV, stroke, viral syndrome, aortic valve failure  {Patient presents with symptoms of an acute illness or injury that is potentially life-threatening.  Patient  presents with sensation of heaviness and dizziness, tested positive for COVID as the likely etiology of his symptoms, suitable for trial of outpatient management with the assistance of family.  He lives with his son, but son is out of town, he has other family at the bedside who live nearby who will help keep track of him over the next few days.  He has renal dysfunction and anemia of chronic disease at baseline, negative troponin and look systemically well to me and reports feeling fine.  I considered admission for this patient but believe he be suitable for trial of outpatient management.      FINAL CLINICAL IMPRESSION(S) / ED DIAGNOSES   Final diagnoses:  Dizziness  COVID-19     Rx / DC Orders   ED Discharge Orders     None        Note:  This document was prepared using Dragon voice recognition software and may include unintentional dictation errors.   Claudene Rover, MD 10/13/23 2125

## 2023-10-13 NOTE — ED Triage Notes (Signed)
 Pt arrives with c/o intermittent dizziness for the past few days. Pt reports chills. Pt denies CP or SOB.

## 2023-11-18 ENCOUNTER — Telehealth: Payer: Self-pay | Admitting: *Deleted

## 2023-11-18 NOTE — Telephone Encounter (Signed)
 The patient called over to [facility] and saying he is at extreme weakness and wanted to see if he could come in earlier than that.I told the staff that will send message to the MD but usually are filled up with the patients

## 2023-11-21 ENCOUNTER — Encounter: Payer: Self-pay | Admitting: Oncology

## 2023-11-21 ENCOUNTER — Inpatient Hospital Stay

## 2023-11-21 ENCOUNTER — Ambulatory Visit: Payer: Self-pay | Admitting: Oncology

## 2023-11-21 ENCOUNTER — Inpatient Hospital Stay: Attending: Oncology | Admitting: Oncology

## 2023-11-21 VITALS — BP 168/66 | HR 61 | Temp 97.6°F | Resp 18 | Ht 68.0 in | Wt 175.3 lb

## 2023-11-21 DIAGNOSIS — N183 Chronic kidney disease, stage 3 unspecified: Secondary | ICD-10-CM | POA: Diagnosis not present

## 2023-11-21 DIAGNOSIS — D509 Iron deficiency anemia, unspecified: Secondary | ICD-10-CM | POA: Diagnosis present

## 2023-11-21 DIAGNOSIS — R7401 Elevation of levels of liver transaminase levels: Secondary | ICD-10-CM | POA: Insufficient documentation

## 2023-11-21 DIAGNOSIS — D649 Anemia, unspecified: Secondary | ICD-10-CM

## 2023-11-21 DIAGNOSIS — Z87891 Personal history of nicotine dependence: Secondary | ICD-10-CM | POA: Diagnosis not present

## 2023-11-21 DIAGNOSIS — E039 Hypothyroidism, unspecified: Secondary | ICD-10-CM | POA: Insufficient documentation

## 2023-11-21 DIAGNOSIS — D631 Anemia in chronic kidney disease: Secondary | ICD-10-CM | POA: Insufficient documentation

## 2023-11-21 DIAGNOSIS — Z79890 Hormone replacement therapy (postmenopausal): Secondary | ICD-10-CM | POA: Diagnosis not present

## 2023-11-21 LAB — CBC WITH DIFFERENTIAL/PLATELET
Abs Immature Granulocytes: 0.03 K/uL (ref 0.00–0.07)
Basophils Absolute: 0 K/uL (ref 0.0–0.1)
Basophils Relative: 0 %
Eosinophils Absolute: 0.1 K/uL (ref 0.0–0.5)
Eosinophils Relative: 1 %
HCT: 22.6 % — ABNORMAL LOW (ref 39.0–52.0)
Hemoglobin: 7.4 g/dL — ABNORMAL LOW (ref 13.0–17.0)
Immature Granulocytes: 1 %
Lymphocytes Relative: 17 %
Lymphs Abs: 1 K/uL (ref 0.7–4.0)
MCH: 28.8 pg (ref 26.0–34.0)
MCHC: 32.7 g/dL (ref 30.0–36.0)
MCV: 87.9 fL (ref 80.0–100.0)
Monocytes Absolute: 0.4 K/uL (ref 0.1–1.0)
Monocytes Relative: 7 %
Neutro Abs: 4.2 K/uL (ref 1.7–7.7)
Neutrophils Relative %: 74 %
Platelets: 191 K/uL (ref 150–400)
RBC: 2.57 MIL/uL — ABNORMAL LOW (ref 4.22–5.81)
RDW: 13.3 % (ref 11.5–15.5)
WBC: 5.7 K/uL (ref 4.0–10.5)
nRBC: 0 % (ref 0.0–0.2)

## 2023-11-21 LAB — HEPATIC FUNCTION PANEL
ALT: 78 U/L — ABNORMAL HIGH (ref 0–44)
AST: 78 U/L — ABNORMAL HIGH (ref 15–41)
Albumin: 3.7 g/dL (ref 3.5–5.0)
Alkaline Phosphatase: 74 U/L (ref 38–126)
Bilirubin, Direct: 0.1 mg/dL (ref 0.0–0.2)
Indirect Bilirubin: 0.6 mg/dL (ref 0.3–0.9)
Total Bilirubin: 0.7 mg/dL (ref 0.0–1.2)
Total Protein: 6.4 g/dL — ABNORMAL LOW (ref 6.5–8.1)

## 2023-11-21 LAB — HEPATITIS PANEL, ACUTE
HCV Ab: NONREACTIVE
Hep A IgM: NONREACTIVE
Hep B C IgM: NONREACTIVE
Hepatitis B Surface Ag: NONREACTIVE

## 2023-11-21 LAB — FERRITIN: Ferritin: 13 ng/mL — ABNORMAL LOW (ref 24–336)

## 2023-11-21 LAB — IRON AND TIBC
Iron: 33 ug/dL — ABNORMAL LOW (ref 45–182)
Saturation Ratios: 10 % — ABNORMAL LOW (ref 17.9–39.5)
TIBC: 343 ug/dL (ref 250–450)
UIBC: 310 ug/dL

## 2023-11-21 LAB — FOLATE: Folate: >20 ng/mL (ref >5.9)

## 2023-11-21 LAB — LACTATE DEHYDROGENASE: LDH: 180 U/L (ref 98–192)

## 2023-11-21 LAB — TSH: TSH: <0.1 u[IU]/mL — ABNORMAL LOW (ref 0.350–4.500)

## 2023-11-21 LAB — VITAMIN B12: Vitamin B-12: 426 pg/mL (ref 180–914)

## 2023-11-21 NOTE — Assessment & Plan Note (Signed)
 Chronic anemia, likely is due to CKD. Rule out other etiologies.  Check cbc iron tibc ferritin, haptoglobin, LDH, myeloma panel, light chain ratio, LFT, B12, folate, TSH Iron deficiency- IV venofer.

## 2023-11-21 NOTE — Addendum Note (Signed)
 Addended by: BABARA CALL on: 11/21/2023 07:27 PM   Modules accepted: Orders

## 2023-11-21 NOTE — Assessment & Plan Note (Signed)
 Check hepatitis panel

## 2023-11-21 NOTE — Assessment & Plan Note (Signed)
 TSH is decreased. He is on levothyroxine. Will forward results to PCP

## 2023-11-21 NOTE — Assessment & Plan Note (Addendum)
 Lab Results  Component Value Date   HGB 7.4 (L) 11/21/2023   TIBC 343 11/21/2023   IRONPCTSAT 10 (L) 11/21/2023   FERRITIN 13 (L) 11/21/2023    Patient has iron deficiency anemia.  Recommend IV venofer weekly x 5.  I discussed about the potential risks including but not limited to allergic reactions/infusion reactions including anaphylactic reactions, diarrhea, phlebitis, high blood pressure, wheezing, SOB, skin rash, weight gain,dark urine, leg swelling, back pain, headache, nausea and fatigue, etc. Patient agrees with the plan.

## 2023-11-21 NOTE — Progress Notes (Addendum)
 Hematology/Oncology Consult note [phone] [phone]        REFERRING PROVIDER: Diedra Lame, MD   CHIEF COMPLAINTS/REASON FOR VISIT:  Evaluation of anemia   ASSESSMENT & PLAN:   Iron deficiency anemia Lab Results  Component Value Date   HGB 7.4 (L) 11/21/2023   TIBC 343 11/21/2023   IRONPCTSAT 10 (L) 11/21/2023   FERRITIN 13 (L) 11/21/2023    Patient has iron deficiency anemia.  Recommend IV venofer weekly x 5.  I discussed about the potential risks including but not limited to allergic reactions/infusion reactions including anaphylactic reactions, diarrhea, phlebitis, high blood pressure, wheezing, SOB, skin rash, weight gain,dark urine, leg swelling, back pain, headache, nausea and fatigue, etc. Patient agrees with the plan.   Anemia in chronic kidney disease Chronic anemia, likely is due to CKD. Rule out other etiologies.  Check cbc iron tibc ferritin, haptoglobin, LDH, myeloma panel, light chain ratio, LFT, B12, folate, TSH Iron deficiency- IV venofer.   Transaminitis Check hepatitis panel.   Hypothyroidism TSH is decreased. He is on levothyroxine. Will forward results to PCP   Orders Placed This Encounter  Procedures   Iron and TIBC    Standing Status:   Future    Number of Occurrences:   1    Expected Date:   11/21/2023    Expiration Date:   02/19/2024   Ferritin    Standing Status:   Future    Number of Occurrences:   1    Expected Date:   11/21/2023    Expiration Date:   02/19/2024   CBC with Differential/Platelet    Standing Status:   Future    Number of Occurrences:   1    Expected Date:   11/21/2023    Expiration Date:   02/19/2024   Haptoglobin    Standing Status:   Future    Number of Occurrences:   1    Expected Date:   11/21/2023    Expiration Date:   02/19/2024   Lactate dehydrogenase    Standing Status:   Future    Number of Occurrences:   1    Expected Date:   11/21/2023    Expiration Date:   02/19/2024    Multiple Myeloma Panel (SPEP&IFE w/QIG)    Standing Status:   Future    Number of Occurrences:   1    Expected Date:   11/21/2023    Expiration Date:   02/19/2024   Kappa/lambda light chains    Standing Status:   Future    Number of Occurrences:   1    Expected Date:   11/21/2023    Expiration Date:   02/19/2024   Hepatic function panel    Standing Status:   Future    Number of Occurrences:   1    Expected Date:   11/21/2023    Expiration Date:   02/19/2024   Hepatitis panel, acute    Standing Status:   Future    Number of Occurrences:   1    Expected Date:   11/21/2023    Expiration Date:   02/19/2024   Folate    Standing Status:   Future    Number of Occurrences:   1    Expected Date:   11/21/2023    Expiration Date:   02/19/2024   Vitamin B12    Standing Status:   Future    Number of Occurrences:   1    Expected Date:   11/21/2023  Expiration Date:   02/19/2024   TSH    Standing Status:   Future    Number of Occurrences:   1    Expected Date:   11/21/2023    Expiration Date:   02/19/2024   Follow up TBD  All questions were answered. The patient knows to call the clinic with any problems, questions or concerns.  Zelphia Cap, MD, PhD [facility] Hematology Oncology 11/21/2023   HISTORY OF PRESENTING ILLNESS:   Timothy Riley is a  83 y.o.  male with PMH listed below was seen in consultation at the request of  Diedra Lame, MD  for evaluation of anemia.   Discussed the use of AI scribe software for clinical note transcription with the patient, who gave verbal consent to proceed.   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 No dizziness or blood in stool.  He has a history of chronic kidney disease and was taken off dialysis two and a half years ago after his kidney function improved. His kidney function improved enough to be taken off  dialysis two and a half years ago,  11/07/2023 Hb 8.7, hct 27.4 Denies hematochezia, hematuria, hematemesis, epistaxis, black tarry stool or easy bruising.    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 Glower MD @ [facility] via R thoracic approach; b. 03/2018 Echo: EF 60-65%, DD. Mildly dil LA. Nl fxn AoV prosthesis. Grad .   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 12/2013 Cath ([facility]): LAD 30p, RI 60, otw nl (per CT surgery note from [facility]); b. 03/2018 MV: EF 59%, no ischemia/scar.   Persistent atrial fibrillation (HCC) 10/2017   a. CHA2DS2VASc = 5-->Eliquis ; b. 12/2017 loaded w/amio-->DCCV.   PSVT (paroxysmal supraventricular tachycardia)    a. 05/2014 s/p RFCA @ [facility].   Right inguinal hernia    Urinary incontinence     SURGICAL HISTORY: Past Surgical History:  Procedure Laterality Date   AORTIC VALVE REPLACEMENT     CARDIAC CATHETERIZATION     CARDIOVERSION N/A 01/01/2018   Procedure: CARDIOVERSION;  Surgeon: Perla Evalene PARAS, MD;  Location: [facility] OR[facility];  Service: Cardiovascular;  Laterality: N/A;   CARDIOVERSION N/A 05/16/2021   Procedure: CARDIOVERSION;  Surgeon: Mady Bruckner, MD;  Location: [facility];  Service: Cardiovascular;  Laterality: N/A;   CARDIOVERSION N/A 05/17/2021   Procedure:  CARDIOVERSION;  Surgeon: Darliss Rogue, MD;  Location: [facility];  Service: Cardiovascular;  Laterality: N/A;   COLONOSCOPY  2010   DIALYSIS/PERMA CATHETER INSERTION N/A 05/26/2021   Procedure: DIALYSIS/PERMA CATHETER INSERTION;  Surgeon: Jama Cordella MATSU, MD;  Location: [facility];  Service: Cardiovascular;  Laterality: N/A;   DIALYSIS/PERMA CATHETER REMOVAL N/A 08/04/2021   Procedure: DIALYSIS/PERMA CATHETER REMOVAL;  Surgeon: Jama Cordella MATSU, MD;  Location: [facility];  Service: Cardiovascular;  Laterality: N/A;   heart valve replacment  2015   TEMPORARY DIALYSIS CATHETER N/A 05/23/2021   Procedure: TEMPORARY DIALYSIS CATHETER;  Surgeon: Jama Cordella MATSU, MD;  Location: [facility];  Service: Cardiovascular;  Laterality: N/A;   XI ROBOTIC ASSISTED INGUINAL HERNIA REPAIR WITH MESH Right 01/07/2019   Procedure: XI ROBOTIC ASSISTED INGUINAL HERNIA REPAIR WITH MESH;  Surgeon: Lane Shope, MD;  Location: [facility];  Service: General;  Laterality: Right;    SOCIAL HISTORY: Social History   Socioeconomic History   Marital status: Married    Spouse name: betty   Number of children: 2   Years of education: 12   Highest education level: High school graduate  Occupational History   Occupation: retired    Comment: truck Hospital doctor  Tobacco Use   Smoking status: Former    Current packs/day: 0.00    Average packs/day: 0.5 packs/day for 1 year (0.5 ttl pk-yrs)    Types: Cigarettes    Start date: 01/30/1971    Quit date: 01/30/1972    Years since quitting: 51.8   Smokeless tobacco: Never   Tobacco comments:    over 45 yrs ago  Vaping Use   Vaping status: Never Used  Substance and Sexual Activity   Alcohol use: No    Alcohol/week: 0.0 standard drinks of alcohol   Drug use: No   Sexual activity: Not Currently  Other Topics Concern   Not on file  Social History Narrative   Not on file   Social Drivers of Health   Financial Resource Strain: Patient  Declined (08/12/2023)   Received from [facility]   Overall Financial Resource Strain (CARDIA)    Difficulty of Paying Living Expenses: Patient declined  Food Insecurity: No Food Insecurity (11/21/2023)   Hunger Vital Sign    Worried About Running Out of Food in the Last Year: Never true    Ran Out of Food in the Last Year: Never true  Transportation Needs: No Transportation Needs (11/21/2023)   PRAPARE - Administrator, Civil Service (Medical): No    Lack of Transportation (Non-Medical): No  Physical Activity: Inactive (11/05/2017)   Exercise Vital Sign    Days of Exercise per Week: 0 days    Minutes of Exercise per Session: 0 min  Stress: No Stress Concern Present (11/05/2017)   [employer] of Occupational Health - Occupational Stress Questionnaire    Feeling of Stress : Not at all  Social Connections: Moderately Isolated (11/05/2017)   Social Connection and Isolation Panel    Frequency of Communication with Friends and Family: Once a week    Frequency of Social Gatherings with Friends and Family: Once a week    Attends Religious Services: Never    Database administrator or Organizations: No    Attends Banker Meetings: Never    Marital Status: Married  Catering manager Violence: Not At Risk (11/21/2023)   Humiliation, Afraid, Rape, and Kick questionnaire    Fear of Current or Ex-Partner: No    Emotionally Abused: No    Physically Abused: No    Sexually Abused: No    FAMILY HISTORY: Family History  Problem Relation Age of Onset   Cancer Mother    Stroke Father     ALLERGIES:  is allergic to  gabapentin , hydralazine, and hydralazine hcl.  MEDICATIONS:  Current Outpatient Medications  Medication Sig Dispense Refill   amiodarone  (PACERONE ) 200 MG tablet Take 1 tablet (200 mg total) by mouth daily. 90 tablet 2   amLODipine  (NORVASC ) 5 MG tablet Take 1 tablet (5 mg total) by mouth daily. Take at noon. May take an additional  tablet (5 MG) in the evening as needed for systolic blood pressure greater then 150. 180 tablet 3   apixaban  (ELIQUIS ) 2.5 MG TABS tablet Take 1 tablet (2.5 mg total) by mouth 2 (two) times daily. 28 tablet    calcitRIOL (ROCALTROL) 0.25 MCG capsule Take 0.25 mcg by mouth.     doxazosin  (CARDURA ) 4 MG tablet Take 1 tablet (4 mg total) by mouth 2 (two) times daily. 180 tablet 2   furosemide  (LASIX ) 40 MG tablet TAKE ONE TABLET (40 MG TOTAL) BY MOUTH DAILY. 90 tablet 3   glipiZIDE  (GLUCOTROL ) 10 MG tablet Take 10 mg by mouth daily before breakfast.     levothyroxine (SYNTHROID) 25 MCG tablet Take 25 mcg by mouth daily before breakfast.     rosuvastatin  (CRESTOR ) 10 MG tablet Take 1 tablet (10 mg total) by mouth daily. 90 tablet 0   No current facility-administered medications for this visit.    Review of Systems  Constitutional:  Positive for fatigue. Negative for appetite change, chills, fever and unexpected weight change.  HENT:   Negative for hearing loss and voice change.   Eyes:  Negative for eye problems and icterus.  Respiratory:  Negative for chest tightness, cough and shortness of breath.   Cardiovascular:  Negative for chest pain and leg swelling.  Gastrointestinal:  Negative for abdominal distention and abdominal pain.  Endocrine: Negative for hot flashes.  Genitourinary:  Negative for difficulty urinating, dysuria and frequency.   Musculoskeletal:  Negative for arthralgias.  Skin:  Negative for itching and rash.  Neurological:  Negative for light-headedness and numbness.  Hematological:  Negative for adenopathy. Does not bruise/bleed easily.  Psychiatric/Behavioral:  Negative for confusion.    PHYSICAL EXAMINATION:  Vitals:   11/21/23 1411  BP: (!) 168/66  Pulse: 61  Resp: 18  Temp: 97.6 F (36.4 C)  SpO2: 100%   Filed Weights   11/21/23 1411  Weight: 175 lb 4.8 oz (79.5 kg)    Physical Exam Constitutional:      General: He is not in acute distress. HENT:      Head: Normocephalic and atraumatic.  Eyes:     General: No scleral icterus. Cardiovascular:     Rate and Rhythm: Normal rate and regular rhythm.     Heart sounds: Normal heart sounds. No murmur heard. Pulmonary:     Effort: Pulmonary effort is normal. No respiratory distress.     Breath sounds: No wheezing.  Abdominal:     General: Bowel sounds are normal. There is no distension.     Palpations: Abdomen is soft.  Musculoskeletal:        General: No deformity. Normal range of motion.     Cervical back: Normal range of motion and neck supple.  Skin:    General: Skin is warm and dry.     Findings: No erythema or rash.  Neurological:     Mental Status: He is alert and oriented to person, place, and time. Mental status is at baseline.     Cranial Nerves: No cranial nerve deficit.     Coordination: Coordination normal.  Psychiatric:        Mood  and Affect: Mood normal.     LABORATORY DATA:  I have reviewed the data as listed    Latest Ref Rng & Units 11/21/2023    3:10 PM 10/13/2023    4:37 PM 08/13/2021    5:11 AM  CBC  WBC 4.0 - 10.5 K/uL 5.7  6.1  4.5   Hemoglobin 13.0 - 17.0 g/dL 7.4  8.9  89.3   Hematocrit 39.0 - 52.0 % 22.6  26.6  32.3   Platelets 150 - 400 K/uL 191  163  160       Latest Ref Rng & Units 11/21/2023    3:10 PM 10/13/2023    4:37 PM 08/13/2021    5:11 AM  CMP  Glucose 70 - 99 mg/dL  891  857   BUN 8 - 23 mg/dL  43  41   Creatinine 9.38 - 1.24 mg/dL  6.52  7.74   Sodium 864 - 145 mmol/L  139  142   Potassium 3.5 - 5.1 mmol/L  3.6  3.4   Chloride 98 - 111 mmol/L  106  108   CO2 22 - 32 mmol/L  20  29   Calcium  8.9 - 10.3 mg/dL  8.6  8.5   Total Protein 6.5 - 8.1 g/dL 6.4  7.1    Total Bilirubin 0.0 - 1.2 mg/dL 0.7  0.9    Alkaline Phos 38 - 126 U/L 74  69    AST 15 - 41 U/L 78  86    ALT 0 - 44 U/L 78  83        RADIOGRAPHIC STUDIES: I have personally reviewed the radiological images as listed and agreed with the findings in the report. No  results found.

## 2023-11-22 ENCOUNTER — Encounter: Payer: Self-pay | Admitting: Oncology

## 2023-11-22 LAB — KAPPA/LAMBDA LIGHT CHAINS
Kappa free light chain: 85.1 mg/L — ABNORMAL HIGH (ref 3.3–19.4)
Kappa, lambda light chain ratio: 2.82 — ABNORMAL HIGH (ref 0.26–1.65)
Lambda free light chains: 30.2 mg/L — ABNORMAL HIGH (ref 5.7–26.3)

## 2023-11-22 LAB — HAPTOGLOBIN: Haptoglobin: 238 mg/dL (ref 38–329)

## 2023-11-22 NOTE — Telephone Encounter (Signed)
-----   Message from Zelphia Cap sent at 11/21/2023  7:23 PM EDT ----- Please let patient know that he has iron deficiency anemia. I recommend him to get IV venofer weekly x 5. Other blood work results are pending. If need further intervention, we will contact him.  Lab in 6 weeks.  Follow up 3 months lab prior to MD +/- Venofer +/- Retacrit.  Labs are ordered. Thanks.  ----- Message ----- From: Rebecka, Lab In [place] Sent: 11/21/2023   3:20 PM EDT To: Zelphia Cap, MD

## 2023-11-22 NOTE — Telephone Encounter (Signed)
 Called pt, no answer. Detailed VM left on pt's son's phone Tatiana) informing him about iron deficiency anemia and MD recommendation for venofer weekly x5, lab follow up in 6 weeks and 3 months follow-up.   Please schedule and notify pt of appts:   -Venofer weekly x5 -Labs in 6 weeks  -3 months: labs 2-3 days prior to MD/ +/- venofer or retacrit

## 2023-11-25 LAB — MULTIPLE MYELOMA PANEL, SERUM
Albumin SerPl Elph-Mcnc: 3.3 g/dL (ref 2.9–4.4)
Albumin/Glob SerPl: 1.2 (ref 0.7–1.7)
Alpha 1: 0.2 g/dL (ref 0.0–0.4)
Alpha2 Glob SerPl Elph-Mcnc: 0.9 g/dL (ref 0.4–1.0)
B-Globulin SerPl Elph-Mcnc: 0.8 g/dL (ref 0.7–1.3)
Gamma Glob SerPl Elph-Mcnc: 0.9 g/dL (ref 0.4–1.8)
Globulin, Total: 2.8 g/dL (ref 2.2–3.9)
IgA: 42 mg/dL — ABNORMAL LOW (ref 61–437)
IgG (Immunoglobin G), Serum: 613 mg/dL (ref 603–1613)
IgM (Immunoglobulin M), Srm: 403 mg/dL — ABNORMAL HIGH (ref 15–143)
M Protein SerPl Elph-Mcnc: 0.2 g/dL — ABNORMAL HIGH
Total Protein ELP: 6.1 g/dL (ref 6.0–8.5)

## 2023-11-26 ENCOUNTER — Inpatient Hospital Stay

## 2023-11-26 VITALS — BP 144/53 | HR 56 | Resp 18

## 2023-11-26 DIAGNOSIS — D509 Iron deficiency anemia, unspecified: Secondary | ICD-10-CM | POA: Diagnosis not present

## 2023-11-26 MED ORDER — IRON SUCROSE 20 MG/ML IV SOLN
200.0000 mg | Freq: Once | INTRAVENOUS | Status: AC
  Administered 2023-11-26: 200 mg via INTRAVENOUS
  Filled 2023-11-26: qty 10

## 2023-11-26 MED ORDER — SODIUM CHLORIDE 0.9% FLUSH
10.0000 mL | Freq: Once | INTRAVENOUS | Status: AC | PRN
  Administered 2023-11-26: 10 mL
  Filled 2023-11-26: qty 10

## 2023-12-02 ENCOUNTER — Other Ambulatory Visit: Payer: Self-pay | Admitting: Cardiovascular Disease

## 2023-12-02 NOTE — Telephone Encounter (Signed)
 Prescription refill request for Eliquis  received. Indication:afib Last office visit:6/25 Scr:3.35  10/25 Age: 83 Weight:79.5  kg  Prescription refilled

## 2023-12-03 ENCOUNTER — Inpatient Hospital Stay: Attending: Oncology

## 2023-12-03 VITALS — BP 165/50 | HR 52 | Temp 98.0°F | Resp 18

## 2023-12-03 DIAGNOSIS — D509 Iron deficiency anemia, unspecified: Secondary | ICD-10-CM | POA: Diagnosis present

## 2023-12-03 MED ORDER — IRON SUCROSE 20 MG/ML IV SOLN
200.0000 mg | Freq: Once | INTRAVENOUS | Status: AC
  Administered 2023-12-03: 200 mg via INTRAVENOUS
  Filled 2023-12-03: qty 10

## 2023-12-10 ENCOUNTER — Inpatient Hospital Stay

## 2023-12-10 VITALS — BP 172/53 | HR 54 | Temp 97.8°F | Resp 18

## 2023-12-10 DIAGNOSIS — D509 Iron deficiency anemia, unspecified: Secondary | ICD-10-CM

## 2023-12-10 MED ORDER — SODIUM CHLORIDE 0.9% FLUSH
10.0000 mL | Freq: Once | INTRAVENOUS | Status: AC | PRN
  Administered 2023-12-10: 10 mL
  Filled 2023-12-10: qty 10

## 2023-12-10 MED ORDER — IRON SUCROSE 20 MG/ML IV SOLN
200.0000 mg | Freq: Once | INTRAVENOUS | Status: AC
  Administered 2023-12-10: 200 mg via INTRAVENOUS
  Filled 2023-12-10: qty 10

## 2023-12-17 ENCOUNTER — Inpatient Hospital Stay

## 2023-12-17 VITALS — BP 190/53 | HR 57 | Resp 18

## 2023-12-17 DIAGNOSIS — D509 Iron deficiency anemia, unspecified: Secondary | ICD-10-CM

## 2023-12-17 MED ORDER — IRON SUCROSE 20 MG/ML IV SOLN
200.0000 mg | Freq: Once | INTRAVENOUS | Status: AC
  Administered 2023-12-17: 200 mg via INTRAVENOUS
  Filled 2023-12-17: qty 10

## 2023-12-17 MED ORDER — EPOETIN ALFA-EPBX 20000 UNIT/ML IJ SOLN: 20000.0000 [IU] | Freq: Once | INTRAMUSCULAR | Status: DC

## 2023-12-18 ENCOUNTER — Telehealth: Payer: Self-pay

## 2023-12-24 ENCOUNTER — Inpatient Hospital Stay

## 2023-12-24 VITALS — BP 148/59 | HR 66 | Temp 97.2°F | Resp 15 | Ht 68.0 in

## 2023-12-24 DIAGNOSIS — D509 Iron deficiency anemia, unspecified: Secondary | ICD-10-CM | POA: Diagnosis not present

## 2023-12-24 MED ORDER — SODIUM CHLORIDE 0.9% FLUSH
10.0000 mL | Freq: Once | INTRAVENOUS | Status: AC | PRN
  Administered 2023-12-24: 10 mL
  Filled 2023-12-24: qty 10

## 2023-12-24 MED ORDER — IRON SUCROSE 20 MG/ML IV SOLN
200.0000 mg | Freq: Once | INTRAVENOUS | Status: AC
  Administered 2023-12-24: 200 mg via INTRAVENOUS
  Filled 2023-12-24: qty 10

## 2024-01-03 ENCOUNTER — Other Ambulatory Visit: Payer: Self-pay

## 2024-01-03 DIAGNOSIS — D509 Iron deficiency anemia, unspecified: Secondary | ICD-10-CM

## 2024-01-06 ENCOUNTER — Inpatient Hospital Stay: Attending: Oncology

## 2024-01-06 DIAGNOSIS — N183 Chronic kidney disease, stage 3 unspecified: Secondary | ICD-10-CM | POA: Diagnosis not present

## 2024-01-06 DIAGNOSIS — D649 Anemia, unspecified: Secondary | ICD-10-CM

## 2024-01-06 DIAGNOSIS — D509 Iron deficiency anemia, unspecified: Secondary | ICD-10-CM | POA: Diagnosis present

## 2024-01-06 LAB — HEMOGLOBIN AND HEMATOCRIT (CANCER CENTER ONLY)
HCT: 26.6 % — ABNORMAL LOW (ref 39.0–52.0)
Hemoglobin: 8.8 g/dL — ABNORMAL LOW (ref 13.0–17.0)

## 2024-01-06 LAB — SAMPLE TO BLOOD BANK

## 2024-01-06 LAB — ABO/RH: ABO/RH(D): O POS

## 2024-02-09 NOTE — Progress Notes (Unsigned)
 Cardiology Office Note  Date:  02/10/2024   ID:  Timothy Riley, DOB 10/20/1940, MRN [id number]  PCP:  Diedra Lame, MD   Chief Complaint  Patient presents with   6 month follow up     Doing well.     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 with bovine valve,  SVT episodes since his AVR, requiring ablation 06/30/2014 at [facility],  diabetes type 2  BPH,  ejection fraction 30% in April 2023 in the setting of atrial fibrillation down from 50% Bradycardia on beta-blockers Chronic renal insufficiency, stage IV, previously on dialysis, followed by nephrology, GFR 19 Repeat echo 03/2022 EF 50 to 55% Maintained on amiodarone  who presents for routine follow-up of his bioprosthetic valve, hypertension , atrial fibrillation  Last seen by myself in clinic 6/25  In follow-up today reports he is doing well, active at baseline  Walks in the mall Works on [employer] to gym 2x a week  Receiving Eliquis  through a grant Denies any bleeding  Recent issues with anemia, iron  deficiency, followed by nephrology and hematology Has received several iron  infusions Lab work reviewed Total chol 114, LDL 56 tolerating Crestor  10 A1C 5.6 CR 3.3 TSH <0.01, managed by PMD Dose was decreased  primary care through the [place],  New hearing aid  Denies chest pain or shortness of breath concerning for angina Tolerating amlodipine  5 daily, Cardura  4 twice daily Previously on isosorbide , this was held  EKG personally reviewed by myself on todays visit EKG Interpretation Date/Time:  Monday February 10 2024 14:17:19 EST Ventricular Rate:  61 PR Interval:  186 QRS Duration:  174 QT Interval:  470 QTC Calculation: 473 R Axis:   -47  Text Interpretation: Normal sinus rhythm 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 block Inferior infarct ,  age undetermined When compared with ECG of 13-Oct-2023 16:37, QT has shortened Confirmed by Talisha Erby [phone]) on 02/10/2024 2:22:39 PM    Other past medical history reviewed Hospitalized April 2023 CHF, EF at that time 30 to 35% in the setting of atrial fibrillation Underwent cardioversion EF low 30% Admission complicated by acute renal failure requiring hemodialysis Completed HD for 6 weeks Catheter was removed as renal function improved  Myoview  July 11, 2021 no significant ischemia, moderate fixed defect noted apical inferior, apical lateral and apical region  Hospitalized June 15 encephalopathy, possible syncope, heart rate 32 glucose 63 in the field Carvedilol  held  hospital July 2023, syncope  heart rate of 32 and glucose of 63.  Brought into the emergency room and found to have acute kidney injury with creatinine of 2.56  Glipizide  and Actos  discontinued  Orthostatics positive 08/30/21 in the office Lasix  down to 40 daily  Zio monitor August 2023 Normal sinus rhythm Patient had a min HR of 44 bpm, max HR of 179 bpm, and avg HR of 64 bpm.  Bundle Branch Block/IVCD was present.    1 run of Ventricular Tachycardia occurred lasting 4 beats with a max rate of 179 bpm (avg 148 bpm).  Not triggered   1 run of Supraventricular Tachycardia occurred lasting 11 beats with a max rate of 107 bpm (avg 95 bpm).  Isolated SVEs were rare (<1.0%), SVE Couplets were rare (<1.0%), and SVE Triplets were rare (<1.0%).  Isolated VEs were rare (<1.0%), and no VE Couplets or VE Triplets were present.  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  PSH:    Past Surgical History:  Procedure Laterality Date   AORTIC VALVE REPLACEMENT     CARDIAC CATHETERIZATION     CARDIOVERSION N/A 01/01/2018   Procedure: CARDIOVERSION;  Surgeon: Perla Evalene PARAS, MD;  Location: [facility];  Service: Cardiovascular;  Laterality: N/A;   CARDIOVERSION N/A 05/16/2021   Procedure: CARDIOVERSION;  Surgeon: Mady Bruckner, MD;  Location: [facility];  Service: Cardiovascular;  Laterality: N/A;   CARDIOVERSION N/A 05/17/2021   Procedure: CARDIOVERSION;  Surgeon: Darliss Rogue, MD;  Location: [facility];  Service: Cardiovascular;  Laterality: N/A;   COLONOSCOPY  2010   DIALYSIS/PERMA CATHETER INSERTION N/A 05/26/2021   Procedure: DIALYSIS/PERMA CATHETER INSERTION;  Surgeon: Jama Cordella MATSU, MD;  Location: [facility];  Service: Cardiovascular;  Laterality: N/A;   DIALYSIS/PERMA CATHETER REMOVAL N/A 08/04/2021   Procedure: DIALYSIS/PERMA CATHETER REMOVAL;  Surgeon: Jama Cordella MATSU, MD;  Location: [facility];  Service: Cardiovascular;  Laterality: N/A;   heart valve replacment  2015   TEMPORARY DIALYSIS CATHETER N/A 05/23/2021   Procedure: TEMPORARY DIALYSIS CATHETER;  Surgeon: Jama Cordella MATSU, MD;  Location: [facility];  Service: Cardiovascular;   Laterality: N/A;   XI ROBOTIC ASSISTED INGUINAL HERNIA REPAIR WITH MESH Right 01/07/2019   Procedure: XI ROBOTIC ASSISTED INGUINAL HERNIA REPAIR WITH MESH;  Surgeon: Lane Shope, MD;  Location: [facility];  Service: General;  Laterality: Right;    Current Outpatient Medications  Medication Sig Dispense Refill   amiodarone  (PACERONE ) 200 MG tablet Take 1 tablet (200 mg total) by mouth daily. 90 tablet 2   amLODipine  (NORVASC ) 5 MG tablet Take 1 tablet (5 mg total) by mouth daily. Take at noon. May take an additional tablet (5 MG) in the evening as needed for systolic blood pressure greater then 150. 180 tablet 3   calcitRIOL (ROCALTROL) 0.25 MCG capsule Take 0.25 mcg by mouth.     doxazosin  (CARDURA ) 4 MG tablet Take 1 tablet (4 mg total) by mouth 2 (two) times daily. 180 tablet 2   ELIQUIS  2.5 MG TABS tablet TAKE ONE TABLET (2.5 MG TOTAL) BY MOUTH TWO (TWO) TIMES DAILY. 180 tablet 3   furosemide  (LASIX ) 40 MG tablet TAKE ONE TABLET (40 MG TOTAL) BY MOUTH DAILY. 90 tablet 3   glipiZIDE  (GLUCOTROL ) 10 MG tablet Take 10 mg by mouth daily before breakfast.     levothyroxine (SYNTHROID) 25 MCG tablet Take 25 mcg by mouth daily before breakfast.     rosuvastatin  (CRESTOR ) 10 MG tablet Take 1 tablet (10 mg total) by mouth daily. 90 tablet 0   No current  facility-administered medications for this visit.    Allergies:   Gabapentin , Hydralazine, and Hydralazine hcl   Social History:  The patient  reports that he quit smoking about 52 years ago. His smoking use included cigarettes. He started smoking about 53 years ago. He has a 0.5 pack-year smoking history. He has never used smokeless tobacco. He reports that he does not drink alcohol and does not use drugs.   Family History:   family history includes Cancer in his mother; Stroke in his father.   Review of Systems: Review of Systems  Constitutional: Negative.   HENT: Negative.    Respiratory: Negative.    Cardiovascular: Negative.    Gastrointestinal: Negative.   Musculoskeletal: Negative.   Neurological: Negative.   Psychiatric/Behavioral: Negative.    All other systems reviewed and are negative.   PHYSICAL EXAM: VS:  BP (!) 140/60 (BP Location: Left Arm, Patient Position: Sitting, Cuff Size: Normal)   Pulse 61   Ht 5' 8 (1.727 m)   Wt 168 lb (76.2 kg)   SpO2 96%   BMI 25.54 kg/m  , BMI Body mass index is 25.54 kg/m. Constitutional:  oriented to person, place, and time. No distress.  HENT:  Head: Grossly normal Eyes:  no discharge. No scleral icterus.  Neck: No JVD, no carotid bruits  Cardiovascular: Regular rate and rhythm, no murmurs appreciated Pulmonary/Chest: Clear to auscultation bilaterally, no wheezes or rales Abdominal: Soft.  no distension.  no tenderness.  Musculoskeletal: Normal range of motion Neurological:  normal muscle tone. Coordination normal. No atrophy Skin: Skin warm and dry Psychiatric: normal affect, pleasant  Recent Labs: 10/13/2023: BUN 43; Creatinine, Ser 3.47; Potassium 3.6; Sodium 139 11/21/2023: ALT 78; Platelets 191; TSH <0.100 01/06/2024: Hemoglobin 8.8    Lipid Panel Lab Results  Component Value Date   CHOL 135 12/23/2018   HDL 28 (L) 12/23/2018   LDLCALC 79 12/23/2018   TRIG 185 (H) 12/23/2018    Wt Readings from Last 3 Encounters:  02/10/24 168 lb (76.2 kg)  11/21/23 175 lb 4.8 oz (79.5 kg)  10/13/23 178 lb (80.7 kg)     ASSESSMENT AND PLAN:  Atrial fibrillation, persistent History of cardioversion Maintaining normal sinus rhythm Not on beta-blocker secondary to bradycardia Continue amiodarone  with Eliquis  2.5 twice daily Reduced dosing for renal failure, age over 79  chronic renal failure, end-stage renal Followed by nephrology, creatinine 3.1 Stable numbers Previously required hemodialysis 3 days a week Monday Wednesday Friday for 6 weeks, now no longer on hemodialysis, port has been removed Remains on Lasix  40 daily  Leg swelling No swelling  on today's visit  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 Ejection fraction 50 to 55% in August 2022 drop in ejection fraction 30 to 35%, Repeat echo with improved ejection fraction 50% Myoview  with no large regions of ischemia Not a good candidate for ACE, ARB, Entresto, SGLT2 inhibitor in the setting of renal dysfunction and hypoglycemia Beta-blockers held for bradycardia Took himself off isosorbide  secondary to orthostasis symptoms - Continue current medications  Essential hypertension Continue Cardura  4 twice daily, amlodipine  5 mg at noon  Diabetes: Weight has decreased through dietary changes, A1c down to 5.6  Arteriosclerosis of coronary artery - Plan: EKG 12-Lead Not a good candidate for cardiac catheterization given renal dysfunction Myoview  has been completed, No high risk ischemia Denies chest pain concerning for angina  S/P AVR (aortic valve replacement) - Plan: EKG 12-Lead On echo, stable prosthetic valve Repeat echo on next clinic visit  Hyperlipidemia Cholesterol  is at goal on the current lipid regimen. No changes to the medications were made.  Iron  deficiency anemia Has completed iron  infusions, followed by nephrology and hematology   Orders Placed This Encounter  Procedures   EKG 12-Lead     Signed, Velinda Lunger, M.D., Ph.D. 02/10/2024  [facility] [place], [place] [phone]

## 2024-02-10 ENCOUNTER — Encounter: Payer: Self-pay | Admitting: Cardiovascular Disease

## 2024-02-10 ENCOUNTER — Ambulatory Visit: Admitting: Cardiovascular Disease

## 2024-02-10 VITALS — BP 140/60 | HR 61 | Ht 68.0 in | Wt 168.0 lb

## 2024-02-10 DIAGNOSIS — I480 Paroxysmal atrial fibrillation: Secondary | ICD-10-CM | POA: Diagnosis not present

## 2024-02-10 DIAGNOSIS — I420 Dilated cardiomyopathy: Secondary | ICD-10-CM

## 2024-02-10 DIAGNOSIS — I951 Orthostatic hypotension: Secondary | ICD-10-CM

## 2024-02-10 DIAGNOSIS — I10 Essential (primary) hypertension: Secondary | ICD-10-CM

## 2024-02-10 DIAGNOSIS — I5022 Chronic systolic (congestive) heart failure: Secondary | ICD-10-CM

## 2024-02-10 MED ORDER — FUROSEMIDE 40 MG PO TABS: 40.0000 mg | ORAL_TABLET | Freq: Every day | ORAL | 3 refills | Status: AC

## 2024-02-10 NOTE — Patient Instructions (Signed)

## 2024-02-18 ENCOUNTER — Inpatient Hospital Stay: Attending: Oncology

## 2024-02-18 DIAGNOSIS — N189 Chronic kidney disease, unspecified: Secondary | ICD-10-CM | POA: Insufficient documentation

## 2024-02-18 DIAGNOSIS — D631 Anemia in chronic kidney disease: Secondary | ICD-10-CM

## 2024-02-18 DIAGNOSIS — D509 Iron deficiency anemia, unspecified: Secondary | ICD-10-CM | POA: Insufficient documentation

## 2024-02-18 LAB — CBC (CANCER CENTER ONLY)
HCT: 27.7 % — ABNORMAL LOW (ref 39.0–52.0)
Hemoglobin: 9.2 g/dL — ABNORMAL LOW (ref 13.0–17.0)
MCH: 29.2 pg (ref 26.0–34.0)
MCHC: 33.2 g/dL (ref 30.0–36.0)
MCV: 87.9 fL (ref 80.0–100.0)
Platelet Count: 161 K/uL (ref 150–400)
RBC: 3.15 MIL/uL — ABNORMAL LOW (ref 4.22–5.81)
RDW: 13.9 % (ref 11.5–15.5)
WBC Count: 6.3 K/uL (ref 4.0–10.5)
nRBC: 0 % (ref 0.0–0.2)

## 2024-02-18 LAB — IRON AND TIBC
Iron: 52 ug/dL (ref 45–182)
Saturation Ratios: 18 % (ref 17.9–39.5)
TIBC: 297 ug/dL (ref 250–450)
UIBC: 245 ug/dL

## 2024-02-18 LAB — SAMPLE TO BLOOD BANK

## 2024-02-18 LAB — FERRITIN: Ferritin: 45 ng/mL (ref 24–336)

## 2024-02-25 ENCOUNTER — Inpatient Hospital Stay

## 2024-02-25 ENCOUNTER — Inpatient Hospital Stay: Admitting: Oncology

## 2024-02-25 NOTE — Assessment & Plan Note (Signed)
 Lab Results  Component Value Date   HGB 9.2 (L) 02/18/2024   TIBC 297 02/18/2024   IRONPCTSAT 18 02/18/2024   FERRITIN 45 02/18/2024    Patient has iron  deficiency anemia.  Recommend IV venofer  weekly x 5.  I discussed about the potential risks including but not limited to allergic reactions/infusion reactions including anaphylactic reactions, diarrhea, phlebitis, high blood pressure, wheezing, SOB, skin rash, weight gain,dark urine, leg swelling, back pain, headache, nausea and fatigue, etc. Patient agrees with the plan.

## 2024-02-25 NOTE — Assessment & Plan Note (Signed)
 Chronic anemia, likely is due to CKD. Rule out other etiologies.  Check cbc iron tibc ferritin, haptoglobin, LDH, myeloma panel, light chain ratio, LFT, B12, folate, TSH Iron deficiency- IV venofer.

## 2024-03-09 ENCOUNTER — Inpatient Hospital Stay: Attending: Oncology | Admitting: Oncology

## 2024-03-09 ENCOUNTER — Inpatient Hospital Stay

## 2024-04-01 IMAGING — CR DG CHEST 2V
1 series · 2 of 2 positions shown · non-contrast
Comparison: March 09, 2021

CLINICAL DATA: Patient complains of fluid buildup due to CHF since
[REDACTED].

EXAM:
CHEST - 2 VIEW

[Series 1: dg chest 2 view · 0.14mm/px · 2 of 2 slices shown]
[im 1/2]
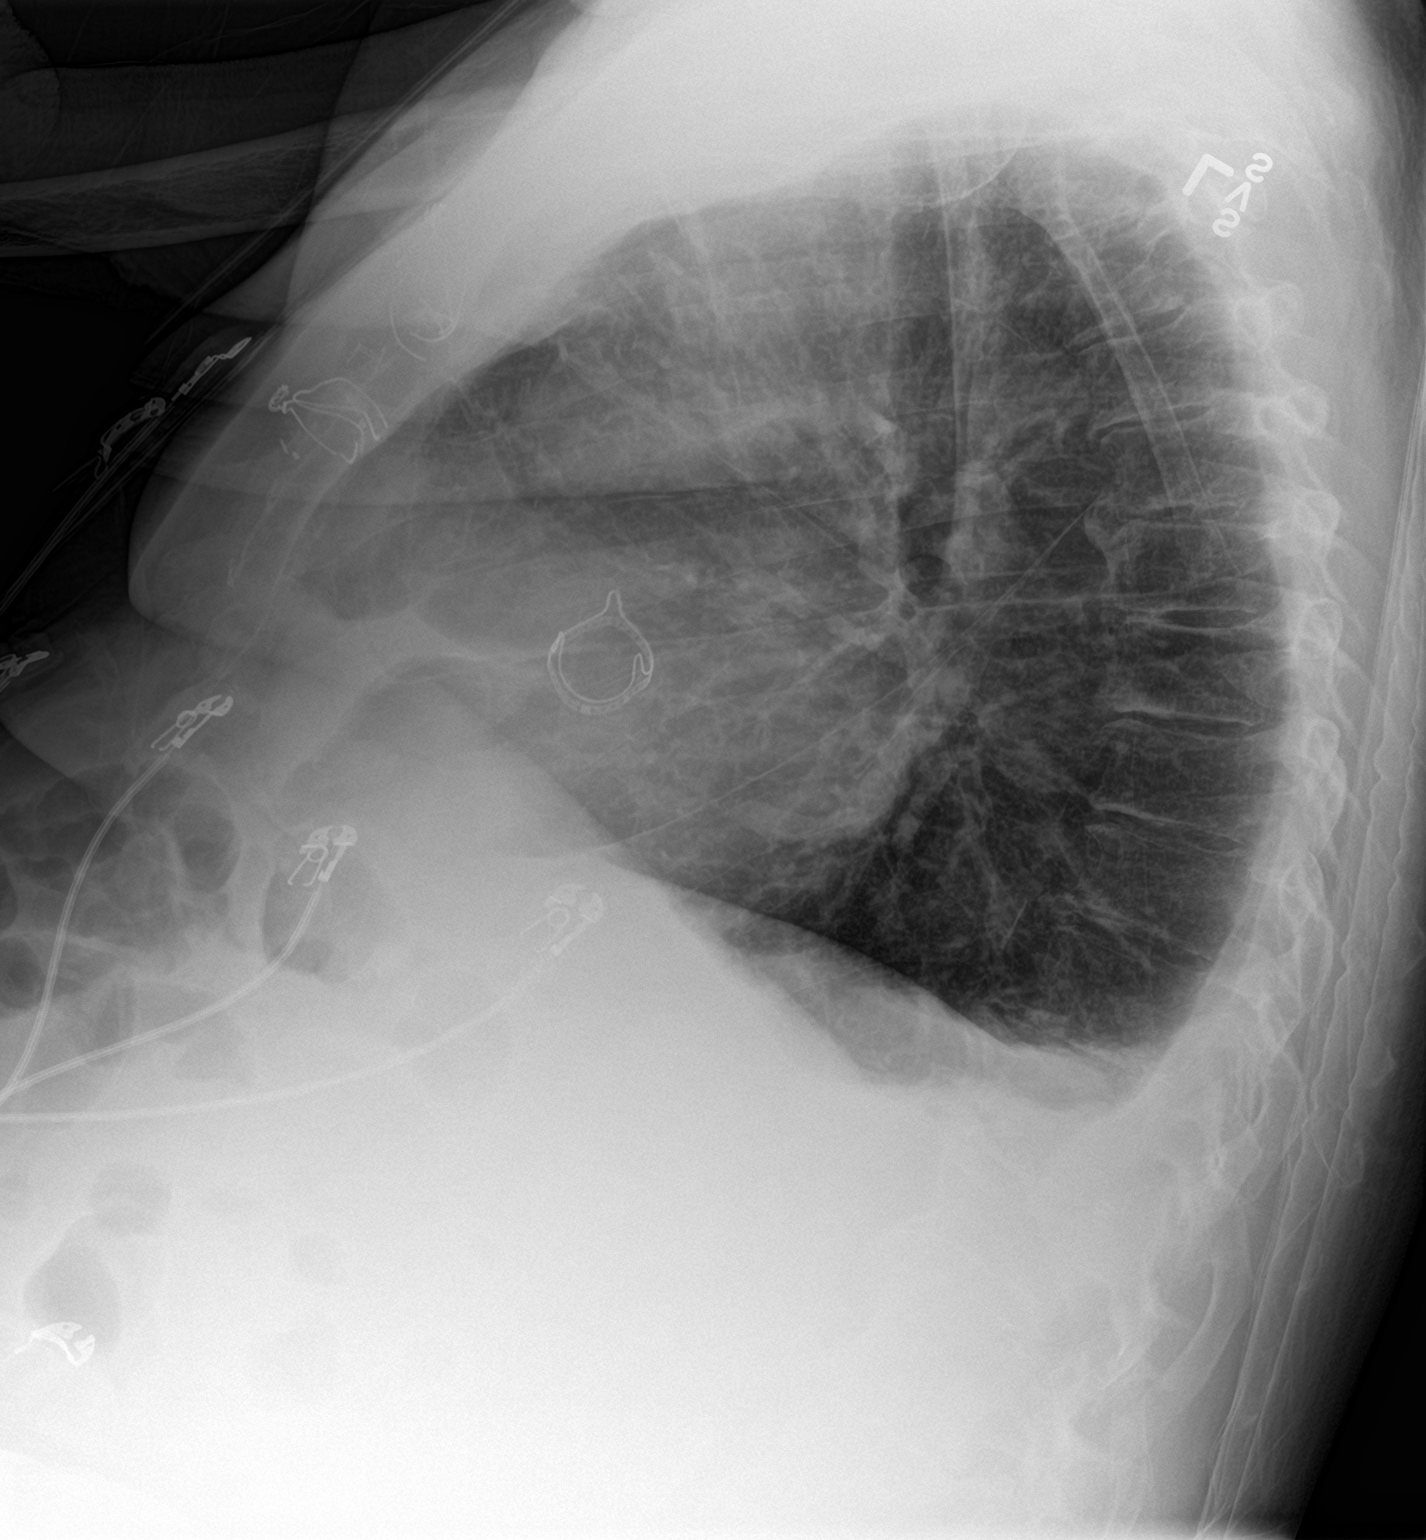
[im 2/2]
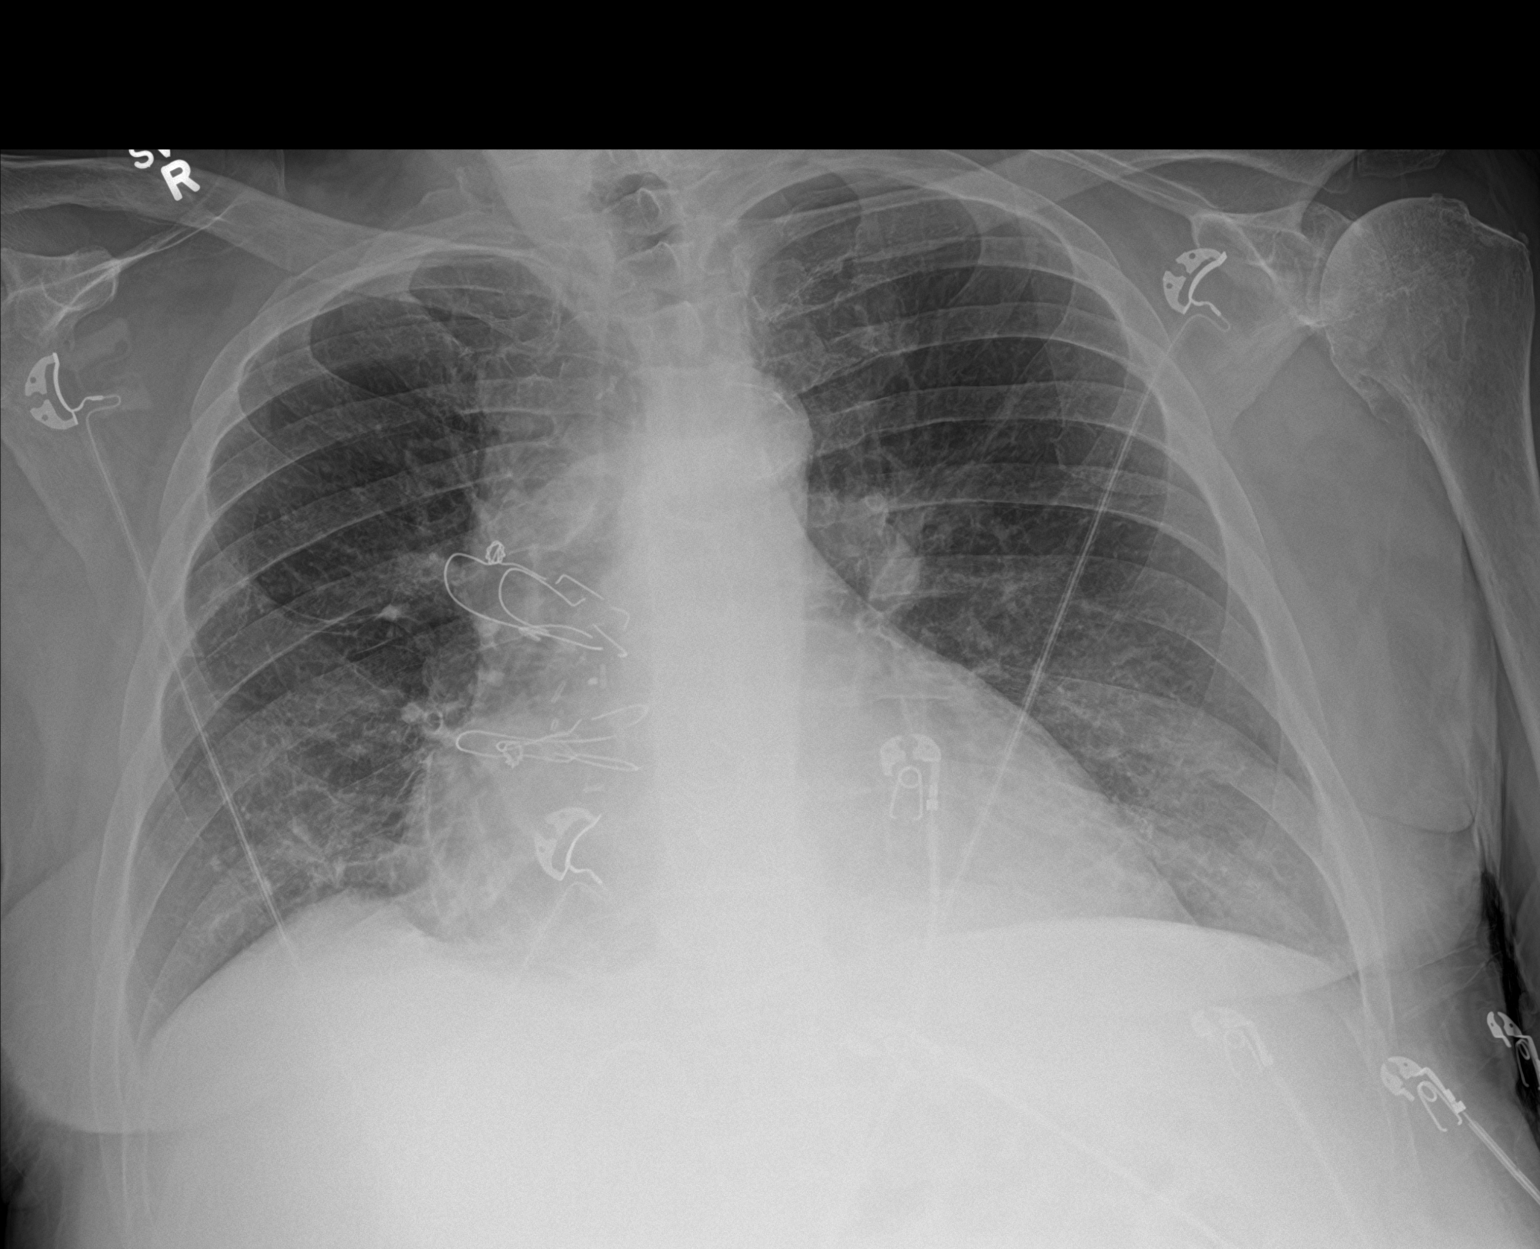

[2 of 2 positions shown; findings below may reference images not displayed]

FINDINGS: Multiple fractured sternal wires are seen. The cardiac silhouette is
mildly enlarged and unchanged in size. Mild, chronic appearing
increased lung markings are seen with mild atelectasis noted within
the right lung base. There is no evidence of pleural effusion or
pneumothorax. The visualized skeletal structures are unremarkable.
IMPRESSION: 1. Chronic appearing increased lung markings with mild right basilar
atelectasis.
2. Stable cardiomegaly with evidence of prior median sternotomy.

## 2024-04-07 IMAGING — US US RENAL
1 series · 15 of 25 positions shown · non-contrast
Comparison: CT abdomen and pelvis 03/07/2021; ultrasound renal
01/26/2019

CLINICAL DATA: Chronic kidney disease. Worsening creatinine.
Decreased urine output.

EXAM:
RENAL / URINARY TRACT ULTRASOUND COMPLETE

[Series 1: us renal · 15 of 35 slices shown]
[im 1/35]
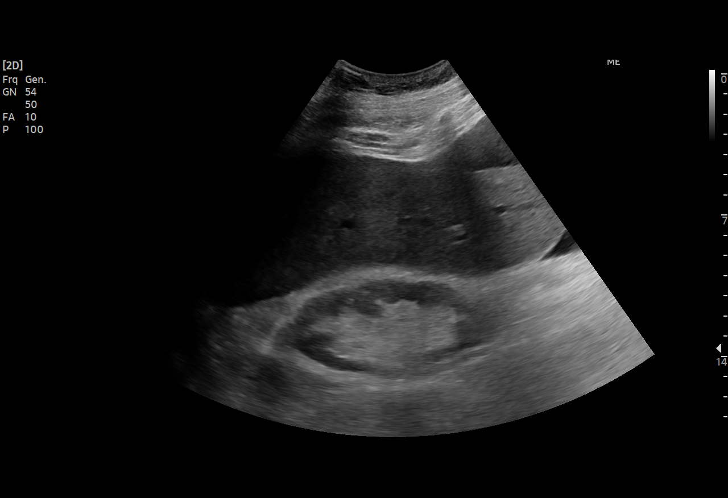
[im 3/35]
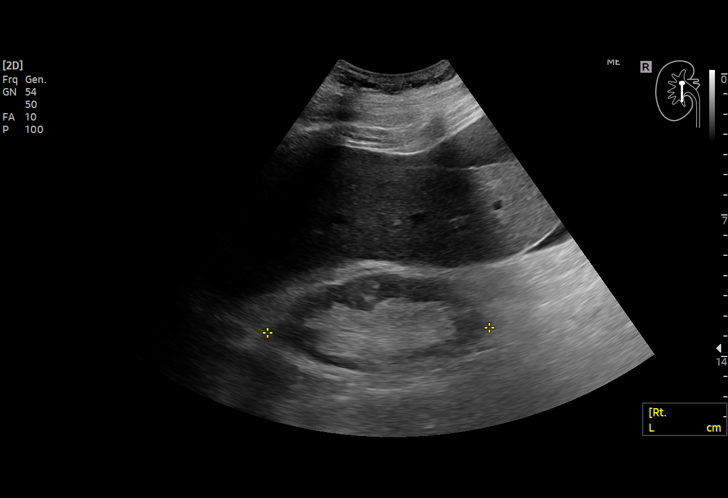
[im 6/35]
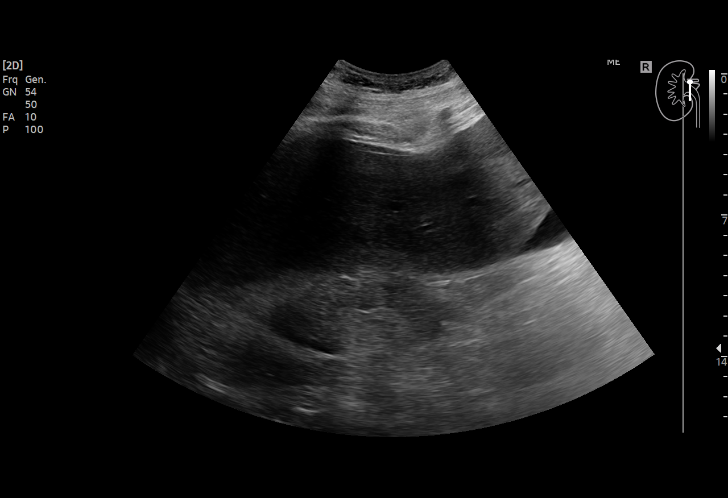
[im 8/35]
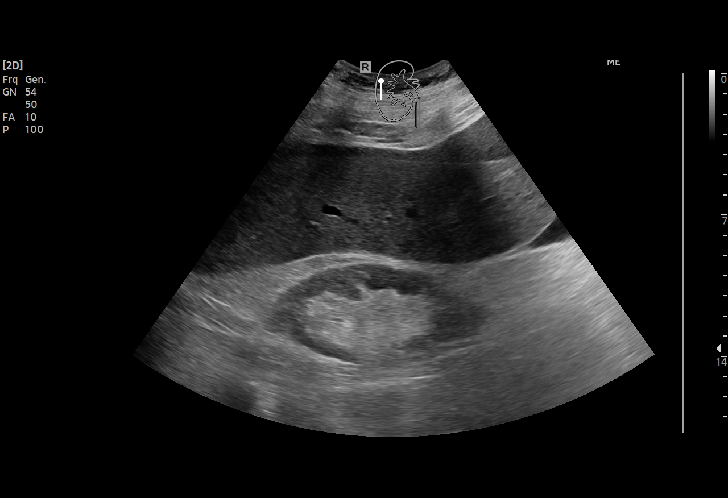
[im 10/35]
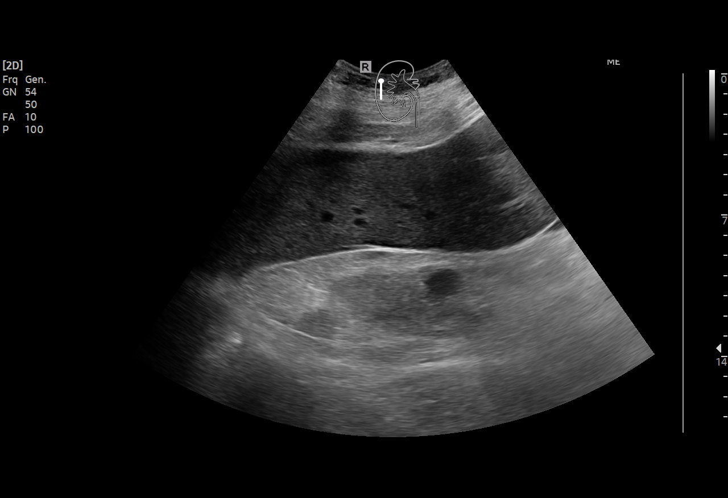
[im 13/35]
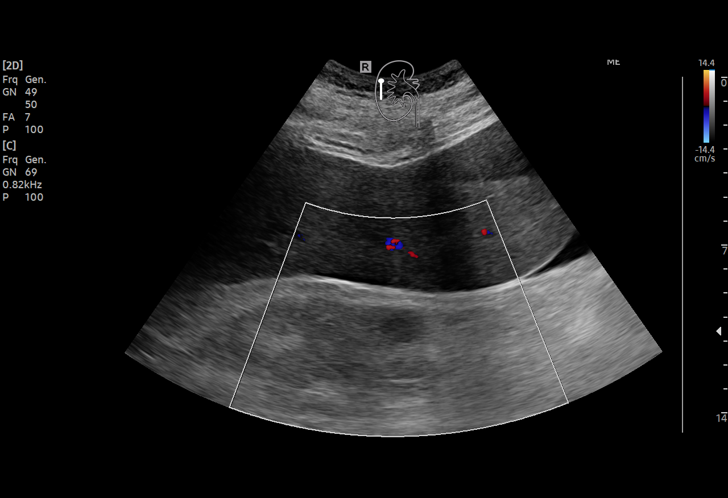
[im 15/35]
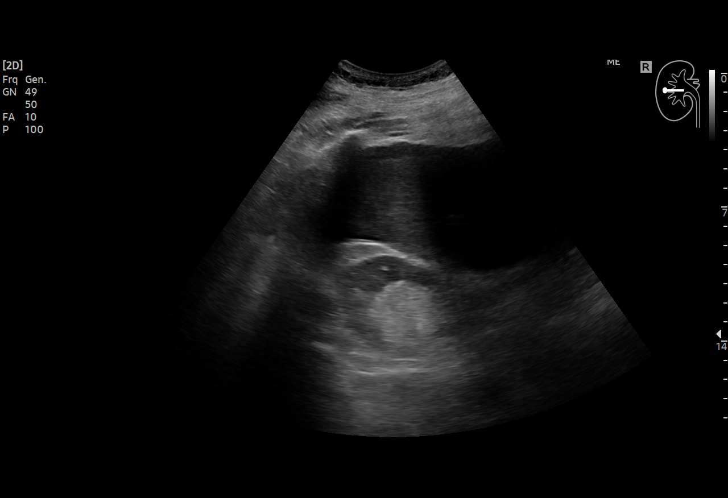
[im 18/35]
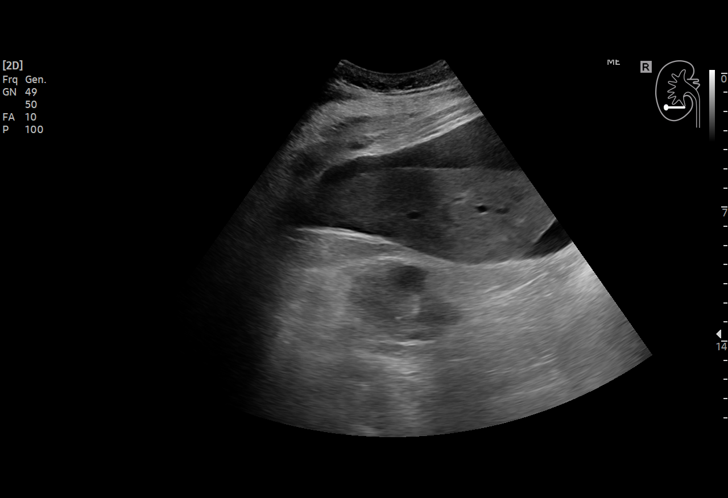
[im 20/35]
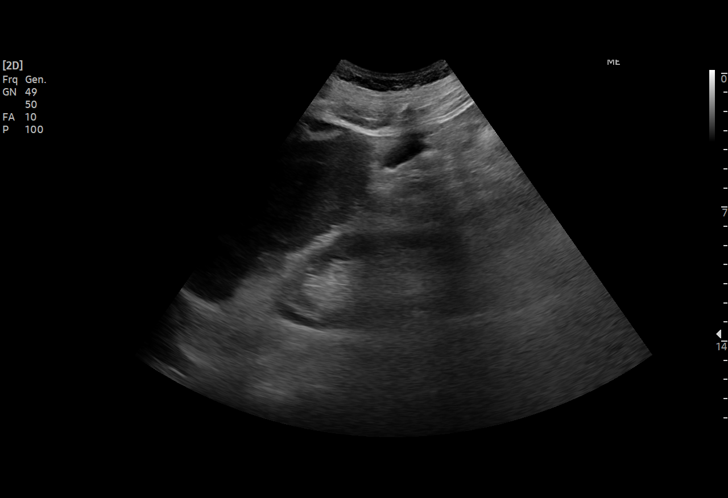
[im 22/35]
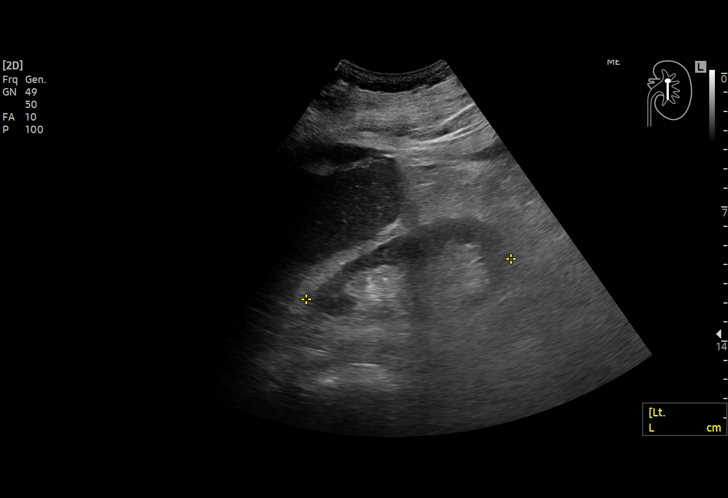
[im 25/35]
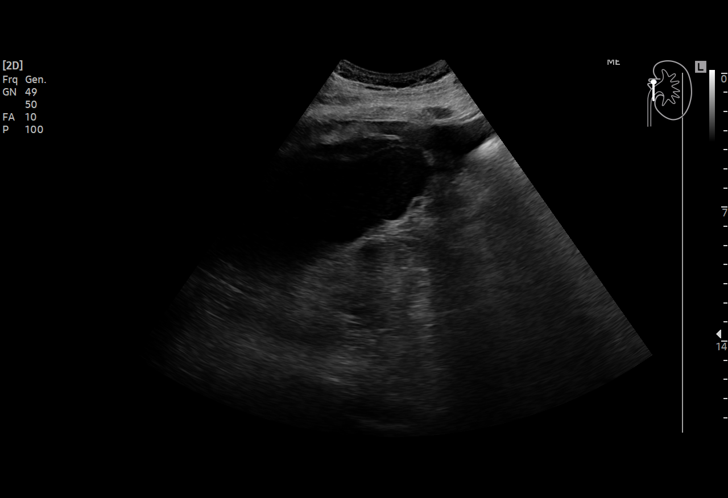
[im 27/35]
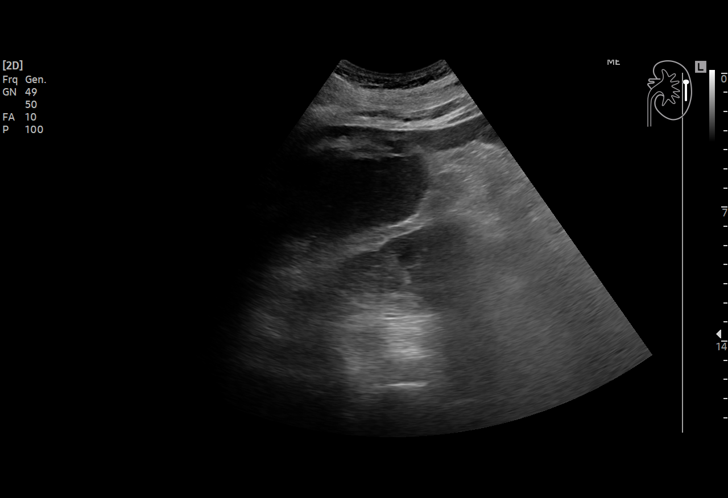
[im 29/35]
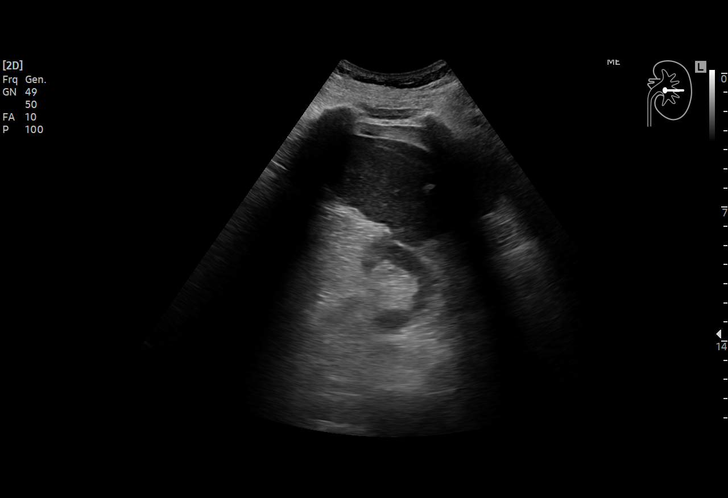
[im 32/35]
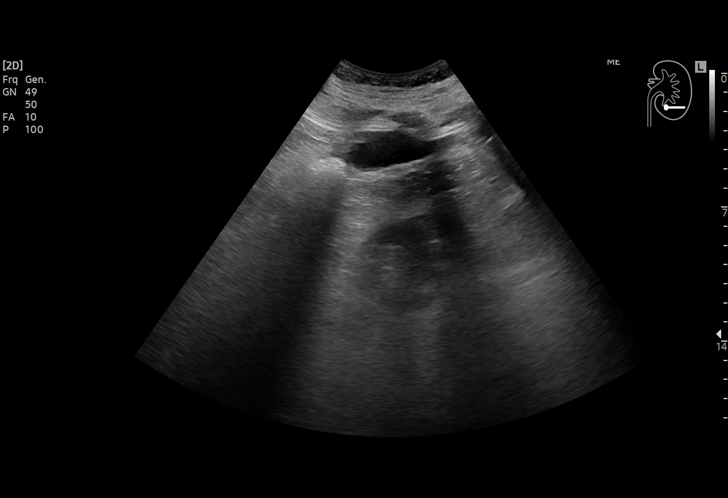
[im 35/35]
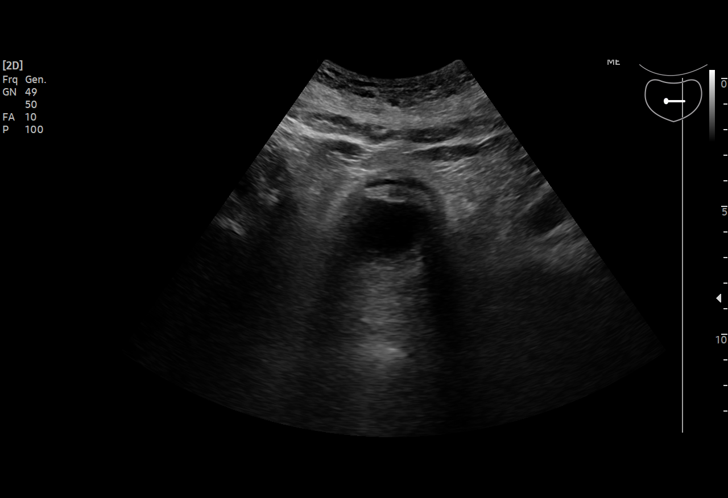

[15 of 25 positions shown; findings below may reference images not displayed]

FINDINGS: Right Kidney:

Renal measurements: 11.0 x 5.1 x 6.4 cm = volume: 187 mL.
Echogenicity within normal limits. There is an avascular hypoechoic
cyst measuring up to 1.6 cm, measured up to 1.9 cm on 01/26/2019
ultrasound and demonstrating fluid density on 03/07/2021 CT. No
hydronephrosis.

Left Kidney:

Renal measurements: 10.9 x 4.9 x 4.1 cm = volume: 115 mL mL.
Echogenicity within normal limits. No mass or hydronephrosis
visualized.

Bladder:

Only minimally distended, limiting evaluation.

Other:

Incidental note of ascites.  Ascites was also seen on prior CT.
IMPRESSION: :
IMPRESSION: 1. Unchanged right lower pole cyst measuring up to 1.6 cm.
2. No hydronephrosis.

## 2024-04-11 IMAGING — DX DG CHEST 1V PORT
2 series · 2 of 2 positions shown · non-contrast
Comparison: Preoperative chest x-ray 05/13/2021

CLINICAL DATA: Dialysis catheter placement

EXAM:
PORTABLE CHEST 1 VIEW

[chest ap (1 of 2)]
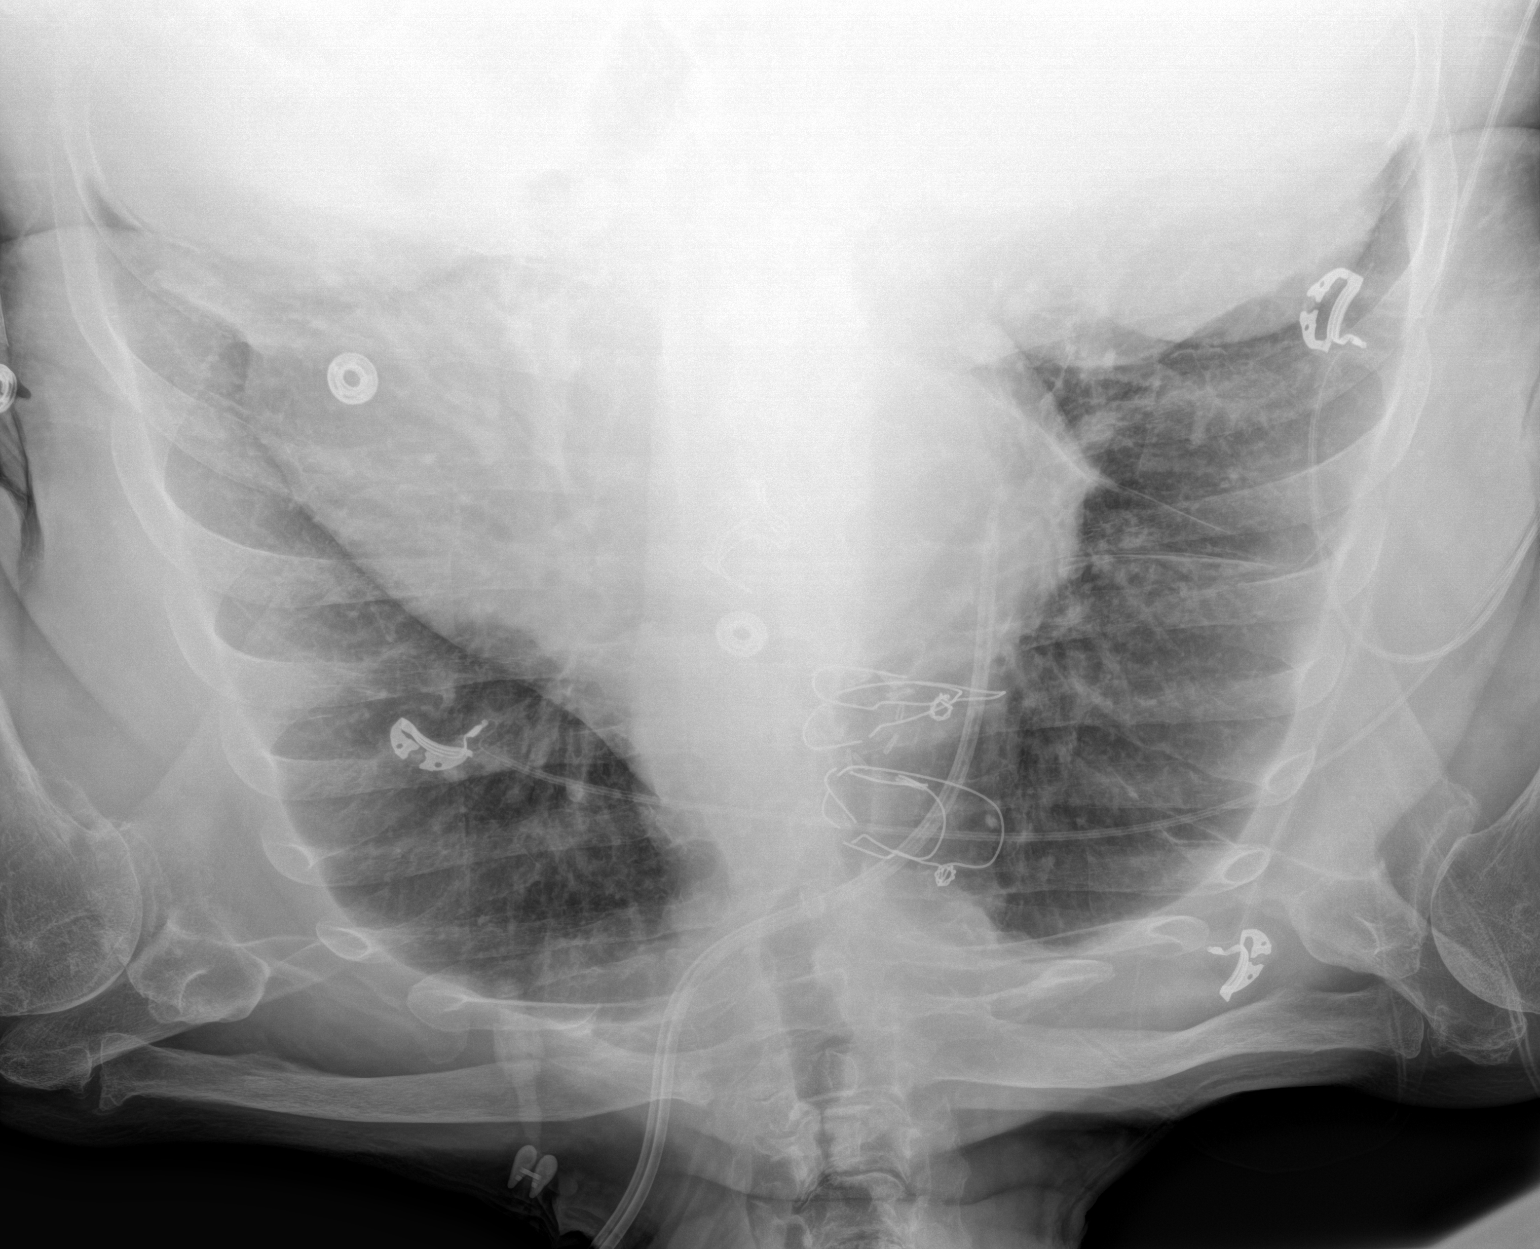

[chest ap (2 of 2)]
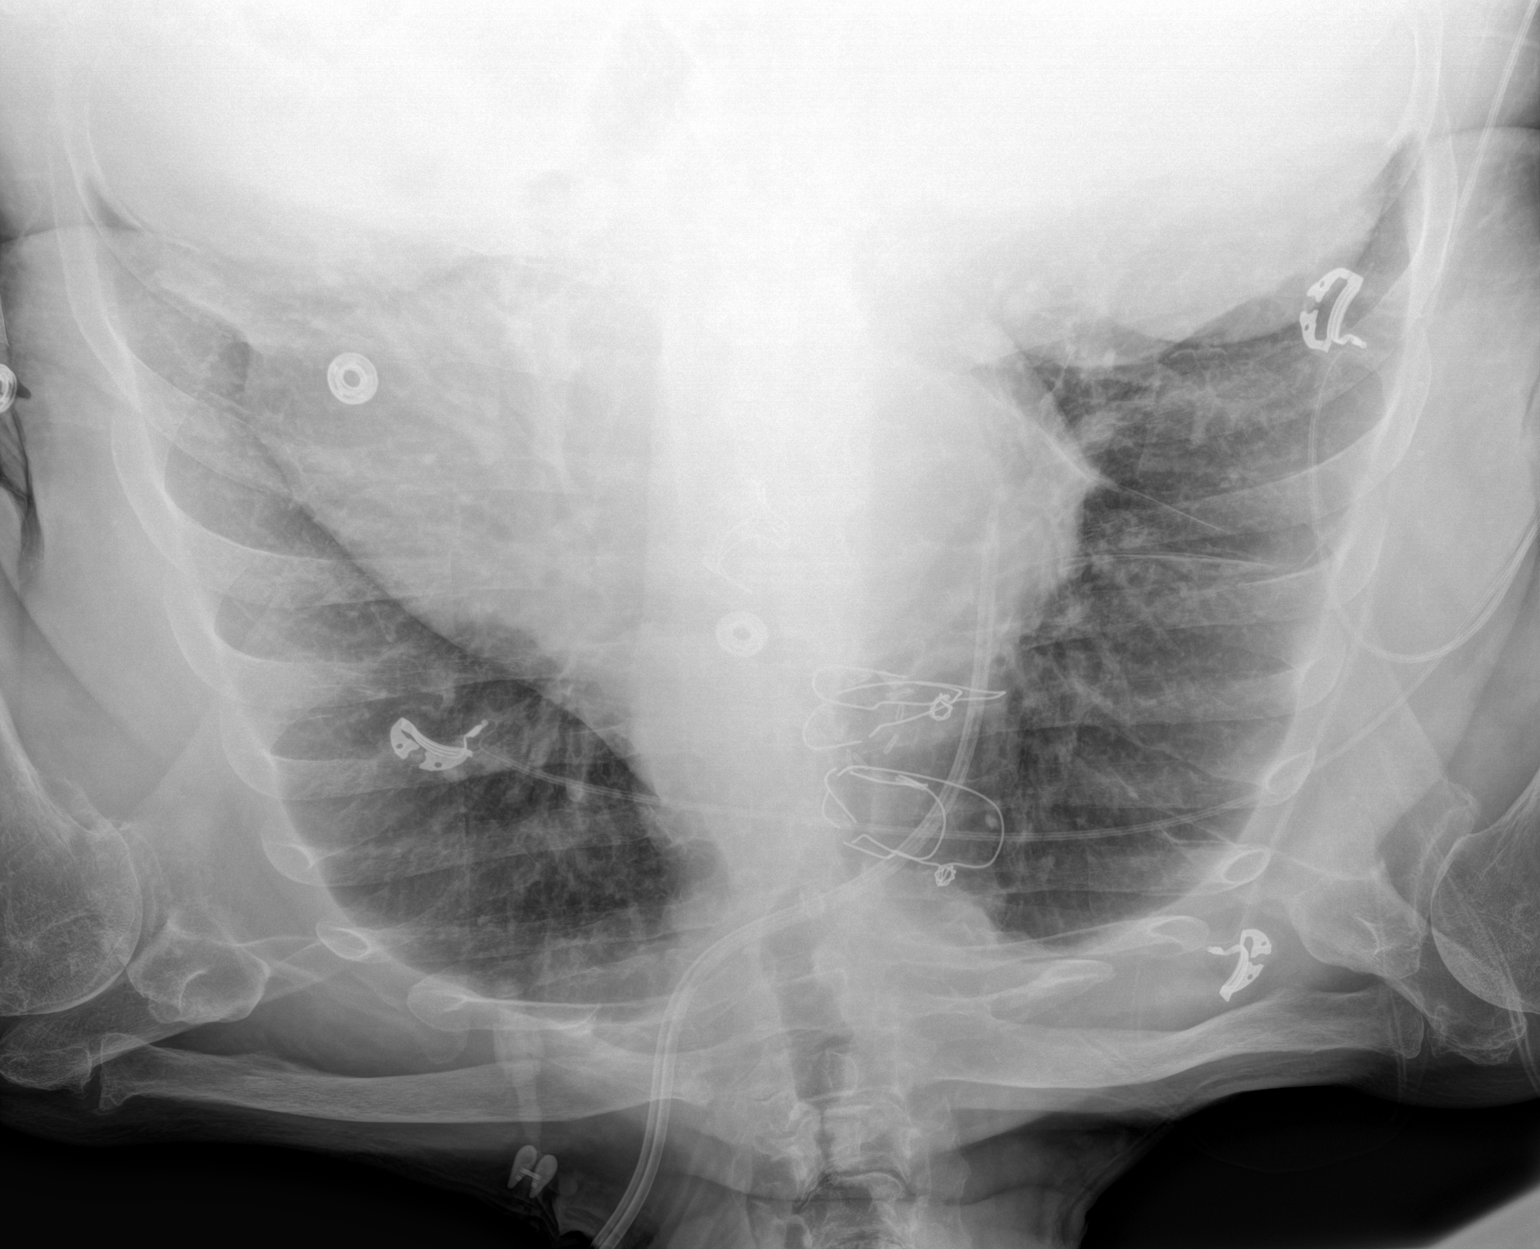

[2 of 2 positions shown; findings below may reference images not displayed]

FINDINGS: New left IJ approach temporary hemodialysis catheter. Catheter tip
overlies the upper right atrium. Stable cardiomegaly. Patient is
status post median sternotomy with evidence of multiple sternal wire
fractures. Background pulmonary vascular congestion bordering on
mild edema. Trace bilateral pleural effusions. No pneumothorax. No
acute osseous abnormality.
IMPRESSION: Left IJ hemodialysis catheter with the catheter tip overlying the
upper right atrium.

Cardiomegaly and pulmonary vascular congestion bordering on mild
interstitial edema.
